# Patient Record
Sex: Male | Born: 1965 | Race: White | Hispanic: No | Marital: Single | Smoking: Never smoker
Health system: Southern US, Community
[De-identification: ages and names within clinical notes are randomized; demographics above are authoritative.]

## PROBLEM LIST (undated history)

## (undated) DIAGNOSIS — F32A Depression, unspecified: Secondary | ICD-10-CM

## (undated) DIAGNOSIS — M109 Gout, unspecified: Secondary | ICD-10-CM

## (undated) DIAGNOSIS — F329 Major depressive disorder, single episode, unspecified: Secondary | ICD-10-CM

## (undated) DIAGNOSIS — J309 Allergic rhinitis, unspecified: Secondary | ICD-10-CM

## (undated) DIAGNOSIS — Z87442 Personal history of urinary calculi: Secondary | ICD-10-CM

## (undated) DIAGNOSIS — G629 Polyneuropathy, unspecified: Secondary | ICD-10-CM

## (undated) DIAGNOSIS — R112 Nausea with vomiting, unspecified: Secondary | ICD-10-CM

## (undated) DIAGNOSIS — E039 Hypothyroidism, unspecified: Secondary | ICD-10-CM

## (undated) DIAGNOSIS — Z8709 Personal history of other diseases of the respiratory system: Secondary | ICD-10-CM

## (undated) DIAGNOSIS — D497 Neoplasm of unspecified behavior of endocrine glands and other parts of nervous system: Secondary | ICD-10-CM

## (undated) DIAGNOSIS — I10 Essential (primary) hypertension: Secondary | ICD-10-CM

## (undated) DIAGNOSIS — F419 Anxiety disorder, unspecified: Secondary | ICD-10-CM

## (undated) DIAGNOSIS — N189 Chronic kidney disease, unspecified: Secondary | ICD-10-CM

## (undated) DIAGNOSIS — M1990 Unspecified osteoarthritis, unspecified site: Secondary | ICD-10-CM

## (undated) DIAGNOSIS — I5030 Unspecified diastolic (congestive) heart failure: Secondary | ICD-10-CM

## (undated) DIAGNOSIS — N200 Calculus of kidney: Secondary | ICD-10-CM

## (undated) DIAGNOSIS — Z9889 Other specified postprocedural states: Secondary | ICD-10-CM

## (undated) DIAGNOSIS — G4730 Sleep apnea, unspecified: Secondary | ICD-10-CM

## (undated) HISTORY — PX: COLON RESECTION: SHX5231

## (undated) HISTORY — DX: Polyneuropathy, unspecified: G62.9

## (undated) HISTORY — DX: Unspecified diastolic (congestive) heart failure: I50.30

## (undated) HISTORY — PX: OTHER SURGICAL HISTORY: SHX169

## (undated) HISTORY — DX: Sleep apnea, unspecified: G47.30

## (undated) HISTORY — DX: Essential (primary) hypertension: I10

## (undated) HISTORY — PX: HERNIA REPAIR: SHX51

## (undated) HISTORY — DX: Calculus of kidney: N20.0

## (undated) HISTORY — DX: Gout, unspecified: M10.9

## (undated) HISTORY — PX: BRAIN SURGERY: SHX531

## (undated) HISTORY — PX: ACHILLES TENDON REPAIR: SUR1153

## (undated) HISTORY — DX: Allergic rhinitis, unspecified: J30.9

---

## 2010-03-24 ENCOUNTER — Emergency Department (HOSPITAL_BASED_OUTPATIENT_CLINIC_OR_DEPARTMENT_OTHER): Admission: EM | Admit: 2010-03-24 | Discharge: 2010-03-24 | Payer: Self-pay | Admitting: Emergency Medicine

## 2010-03-24 ENCOUNTER — Ambulatory Visit: Payer: Self-pay | Admitting: Radiology

## 2011-12-04 DIAGNOSIS — Z8619 Personal history of other infectious and parasitic diseases: Secondary | ICD-10-CM | POA: Diagnosis present

## 2012-05-21 DIAGNOSIS — I10 Essential (primary) hypertension: Secondary | ICD-10-CM | POA: Insufficient documentation

## 2012-11-22 DIAGNOSIS — N200 Calculus of kidney: Secondary | ICD-10-CM | POA: Insufficient documentation

## 2012-11-22 DIAGNOSIS — N281 Cyst of kidney, acquired: Secondary | ICD-10-CM | POA: Insufficient documentation

## 2012-12-27 DIAGNOSIS — L02211 Cutaneous abscess of abdominal wall: Secondary | ICD-10-CM | POA: Diagnosis present

## 2013-04-23 DIAGNOSIS — E790 Hyperuricemia without signs of inflammatory arthritis and tophaceous disease: Secondary | ICD-10-CM | POA: Insufficient documentation

## 2013-06-05 HISTORY — PX: ABDOMINAL SURGERY: SHX537

## 2013-06-10 DIAGNOSIS — G4730 Sleep apnea, unspecified: Secondary | ICD-10-CM | POA: Insufficient documentation

## 2013-07-28 DIAGNOSIS — L089 Local infection of the skin and subcutaneous tissue, unspecified: Secondary | ICD-10-CM | POA: Insufficient documentation

## 2013-07-28 DIAGNOSIS — T148XXA Other injury of unspecified body region, initial encounter: Secondary | ICD-10-CM

## 2013-08-02 DIAGNOSIS — E668 Other obesity: Secondary | ICD-10-CM | POA: Diagnosis present

## 2013-08-02 DIAGNOSIS — I5032 Chronic diastolic (congestive) heart failure: Secondary | ICD-10-CM | POA: Diagnosis present

## 2013-08-07 DIAGNOSIS — I5033 Acute on chronic diastolic (congestive) heart failure: Secondary | ICD-10-CM | POA: Diagnosis present

## 2013-08-07 DIAGNOSIS — S37009A Unspecified injury of unspecified kidney, initial encounter: Secondary | ICD-10-CM | POA: Insufficient documentation

## 2013-08-07 DIAGNOSIS — G4733 Obstructive sleep apnea (adult) (pediatric): Secondary | ICD-10-CM | POA: Diagnosis present

## 2013-08-07 DIAGNOSIS — R5381 Other malaise: Secondary | ICD-10-CM | POA: Insufficient documentation

## 2013-08-07 DIAGNOSIS — G64 Other disorders of peripheral nervous system: Secondary | ICD-10-CM | POA: Insufficient documentation

## 2013-08-07 DIAGNOSIS — M159 Polyosteoarthritis, unspecified: Secondary | ICD-10-CM | POA: Insufficient documentation

## 2013-08-29 ENCOUNTER — Encounter (HOSPITAL_BASED_OUTPATIENT_CLINIC_OR_DEPARTMENT_OTHER): Payer: BC Managed Care – PPO | Attending: General Surgery

## 2013-08-29 DIAGNOSIS — T8189XA Other complications of procedures, not elsewhere classified, initial encounter: Secondary | ICD-10-CM | POA: Insufficient documentation

## 2013-08-29 DIAGNOSIS — Y836 Removal of other organ (partial) (total) as the cause of abnormal reaction of the patient, or of later complication, without mention of misadventure at the time of the procedure: Secondary | ICD-10-CM | POA: Insufficient documentation

## 2013-09-05 ENCOUNTER — Encounter (HOSPITAL_BASED_OUTPATIENT_CLINIC_OR_DEPARTMENT_OTHER): Payer: BC Managed Care – PPO | Attending: General Surgery

## 2013-09-05 DIAGNOSIS — Z9049 Acquired absence of other specified parts of digestive tract: Secondary | ICD-10-CM | POA: Insufficient documentation

## 2013-09-05 DIAGNOSIS — Y836 Removal of other organ (partial) (total) as the cause of abnormal reaction of the patient, or of later complication, without mention of misadventure at the time of the procedure: Secondary | ICD-10-CM | POA: Insufficient documentation

## 2013-09-05 DIAGNOSIS — T8189XA Other complications of procedures, not elsewhere classified, initial encounter: Secondary | ICD-10-CM | POA: Insufficient documentation

## 2013-10-03 ENCOUNTER — Encounter: Payer: Self-pay | Admitting: Pulmonary Disease

## 2013-10-06 ENCOUNTER — Ambulatory Visit (INDEPENDENT_AMBULATORY_CARE_PROVIDER_SITE_OTHER): Payer: BC Managed Care – PPO | Admitting: Pulmonary Disease

## 2013-10-06 ENCOUNTER — Encounter (INDEPENDENT_AMBULATORY_CARE_PROVIDER_SITE_OTHER): Payer: Self-pay

## 2013-10-06 ENCOUNTER — Ambulatory Visit (INDEPENDENT_AMBULATORY_CARE_PROVIDER_SITE_OTHER)
Admission: RE | Admit: 2013-10-06 | Discharge: 2013-10-06 | Disposition: A | Discharge status: Home / Self Care | Payer: BC Managed Care – PPO | Source: Ambulatory Visit | Attending: Pulmonary Disease | Admitting: Pulmonary Disease

## 2013-10-06 ENCOUNTER — Encounter: Payer: Self-pay | Admitting: Pulmonary Disease

## 2013-10-06 VITALS — BP 128/78 | HR 78 | Ht 71.0 in | Wt >= 6400 oz

## 2013-10-06 DIAGNOSIS — R062 Wheezing: Secondary | ICD-10-CM

## 2013-10-06 DIAGNOSIS — R0600 Dyspnea, unspecified: Secondary | ICD-10-CM

## 2013-10-06 DIAGNOSIS — R0609 Other forms of dyspnea: Secondary | ICD-10-CM

## 2013-10-06 DIAGNOSIS — R0989 Other specified symptoms and signs involving the circulatory and respiratory systems: Secondary | ICD-10-CM

## 2013-10-06 DIAGNOSIS — J45909 Unspecified asthma, uncomplicated: Secondary | ICD-10-CM | POA: Insufficient documentation

## 2013-10-06 DIAGNOSIS — G4733 Obstructive sleep apnea (adult) (pediatric): Secondary | ICD-10-CM

## 2013-10-06 MED ORDER — ALBUTEROL SULFATE HFA 108 (90 BASE) MCG/ACT IN AERS: 1.0000 | INHALATION_SPRAY | Freq: Four times a day (QID) | RESPIRATORY_TRACT | Status: DC | PRN

## 2013-10-06 NOTE — Progress Notes (Signed)
Chief Complaint  Patient presents with  . Pulmonary Consult    referred by Dr. Shelia Media for SOB and wheezing.    History of Present Illness: [name] [name] is a 48 y.o. male for evaluation of dyspnea and sleep problems.  He was in hospital recently for abdominal surgery with persistent infection.  During hospital stay there was concern he has sleep apnea.  He was tried on BiPAP in hospital, and this helped.  He does not have CPAP/BiPAP at home.  He does snore, and stops breathing while asleep.  He will wake up with a choking sensation.    He goes to bed at 1230 am.  He wakes up several times to use the bathroom.  He gets out of bed at 930 am.  He feels tired in the morning.  He can fall asleep easily if he is sitting quiet.  His Epworth score is 13 out of 24.  He does sometimes talk in his sleep.  He has noticed more trouble with his breathing since being in hospital.  He gets wheeze in his throat and his chest.  He is not aware of asthma history, but does get seasonal allergies (worse in Spring/Fall).  He gets post-nasal drip.  He has used flonase before >> this helped, but not using recently.  He denies reflux.  There is no history of smoking.  He denies history of pneumonia.  He was given nebulizer treatment in hospital, and this helped.  Tests: Echo 06/10/13 >> EF 60 to 65%  Demar Camuso  has a past medical history of Sleep apnea; Allergic rhinitis; Diastolic CHF; Gout; Nephrolithiasis; Peripheral neuropathy; and Hypertension.  Jovanie Tulloch  has past surgical history that includes Sigmoid Colonoscopy and Hernia repair.  Prior to Admission medications   Medication Sig Start Date End Date Taking? Authorizing Provider  acetaminophen (TYLENOL) 650 MG CR tablet Take 650 mg by mouth every 6 (six) hours as needed for pain.   Yes Historical Provider, MD  allopurinol (ZYLOPRIM) 300 MG tablet Take 300 mg by mouth daily.   Yes Historical Provider, MD  ALPRAZolam (XANAX) 0.25 MG tablet  Take 0.25 mg by mouth 3 (three) times daily as needed for anxiety.   Yes Historical Provider, MD  buPROPion (WELLBUTRIN XL) 300 MG 24 hr tablet Take 300 mg by mouth daily.   Yes Historical Provider, MD  carvedilol (COREG) 25 MG tablet Take 12.5 mg by mouth 2 (two) times daily with a meal.    Yes Historical Provider, MD  Diclofenac-Misoprostol (ARTHROTEC) 75-0.2 MG TBEC Take 1 tablet by mouth 2 (two) times daily.    Yes Historical Provider, MD  doxycycline (VIBRAMYCIN) 100 MG capsule Take 1 capsule by mouth 2 (two) times daily. 10/02/13  Yes Historical Provider, MD  fluticasone (FLONASE) 50 MCG/ACT nasal spray Place 2 sprays into both nostrils daily.   Yes Historical Provider, MD  furosemide (LASIX) 40 MG tablet Take 40 mg by mouth daily.   Yes Historical Provider, MD  gabapentin (NEURONTIN) 300 MG capsule Take 900 mg by mouth at bedtime.   Yes Historical Provider, MD  HYDROcodone-acetaminophen (NORCO/VICODIN) 5-325 MG per tablet Take 1 tablet by mouth every 6 (six) hours as needed for moderate pain.   Yes Historical Provider, MD  Multiple Vitamin (MULTIVITAMIN) tablet Take 1 tablet by mouth daily.   Yes Historical Provider, MD  potassium chloride SA (K-DUR,KLOR-CON) 20 MEQ tablet Take 20 mEq by mouth 2 (two) times daily.   Yes Historical Provider, MD  vitamin C (ASCORBIC  ACID) 500 MG tablet Take 500 mg by mouth daily.   Yes Historical Provider, MD  zinc sulfate 220 MG capsule Take 220 mg by mouth daily.   Yes Historical Provider, MD    Allergies  Allergen Reactions  . Morphine And Related Hives  . Penicillins Nausea And Vomiting    His family history includes Gout in his brother and father; Hypertension in his father and mother; Hypothyroidism in his father.  He  reports that he has never smoked. He has never used smokeless tobacco. He reports that he does not drink alcohol or use illicit drugs.  Review of Systems  Constitutional: Negative for fever and unexpected weight change.  HENT:  Negative for congestion, dental problem, ear pain, nosebleeds, postnasal drip, rhinorrhea, sinus pressure, sneezing, sore throat and trouble swallowing.   Eyes: Negative for redness and itching.  Respiratory: Positive for shortness of breath. Negative for cough, chest tightness and wheezing.   Cardiovascular: Negative for palpitations and leg swelling.  Gastrointestinal: Negative for nausea and vomiting.  Genitourinary: Negative for dysuria.  Musculoskeletal: Positive for arthralgias and joint swelling.  Skin: Negative for rash.  Neurological: Negative for headaches.  Hematological: Does not bruise/bleed easily.  Psychiatric/Behavioral: Positive for dysphoric mood. The patient is nervous/anxious.    Physical Exam:  General - No distress, in wheelchair ENT - No sinus tenderness, no oral exudate, no LAN, no thyromegaly, TM clear, pupils equal/reactive, MP 4 Cardiac - s1s2 regular, no murmur, pulses symmetric Chest - No wheeze/rales/dullness, good air entry, normal respiratory excursion Back - No focal tenderness Abd - wound vac in place Ext - 2+ non pitting edema Neuro - Normal strength, cranial nerves intact Skin - No rashes Psych - Normal mood, and behavior   Assessment/Plan:  Chesley Mires, MD [facility] Pager:  [phone]

## 2013-10-06 NOTE — Patient Instructions (Signed)
Proair two puffs up to four times per day for cough, wheeze, or chest congestion Flonase two sprays each nostril daily Will schedule breathing test (PFT) Chest xray today Will schedule home sleep study Follow up in 8 weeks

## 2013-10-06 NOTE — Assessment & Plan Note (Addendum)
He has snoring, sleep disruption, witnessed apnea, and daytime sleepiness.  He has history of hypertension.  I am concerned he has sleep apnea.  We discussed how sleep apnea can affect various health problems including risks for hypertension, cardiovascular disease, and diabetes.  We also discussed how sleep disruption can increase risks for accident, such as while driving.  Weight loss as a means of improving sleep apnea was also reviewed.  Additional treatment options discussed were CPAP therapy, oral appliance, and surgical intervention.  Will arrange for home sleep study so that he can have support structures in place for his abdominal wound vac.  I do not think it would be safe for him to have sleep study in sleep lab at this time.

## 2013-10-06 NOTE — Assessment & Plan Note (Signed)
He has significant allergies and likely has post-nasal drip with upper airway cough syndrome.  Will have him try flonase.  He may have a component of asthma.  Will check CXR and PFT.  Will also try him on prn proair.

## 2013-10-06 NOTE — Progress Notes (Deleted)
   Subjective:    Patient ID: [name] [name], male    DOB: 1965/08/09, 48 y.o.   MRN: [id number]  HPI    Review of Systems  Constitutional: Negative for fever and unexpected weight change.  HENT: Negative for congestion, dental problem, ear pain, nosebleeds, postnasal drip, rhinorrhea, sinus pressure, sneezing, sore throat and trouble swallowing.   Eyes: Negative for redness and itching.  Respiratory: Positive for shortness of breath. Negative for cough, chest tightness and wheezing.   Cardiovascular: Negative for palpitations and leg swelling.  Gastrointestinal: Negative for nausea and vomiting.  Genitourinary: Negative for dysuria.  Musculoskeletal: Positive for arthralgias and joint swelling.  Skin: Negative for rash.  Neurological: Negative for headaches.  Hematological: Does not bruise/bleed easily.  Psychiatric/Behavioral: Positive for dysphoric mood. The patient is nervous/anxious.        Objective:   Physical Exam        Assessment & Plan:

## 2013-10-07 ENCOUNTER — Other Ambulatory Visit: Payer: Self-pay | Admitting: Internal Medicine

## 2013-10-07 DIAGNOSIS — R599 Enlarged lymph nodes, unspecified: Secondary | ICD-10-CM

## 2013-10-16 ENCOUNTER — Encounter (HOSPITAL_BASED_OUTPATIENT_CLINIC_OR_DEPARTMENT_OTHER): Payer: BC Managed Care – PPO | Attending: Internal Medicine

## 2013-10-16 DIAGNOSIS — Y836 Removal of other organ (partial) (total) as the cause of abnormal reaction of the patient, or of later complication, without mention of misadventure at the time of the procedure: Secondary | ICD-10-CM | POA: Insufficient documentation

## 2013-10-16 DIAGNOSIS — T8189XA Other complications of procedures, not elsewhere classified, initial encounter: Secondary | ICD-10-CM | POA: Insufficient documentation

## 2013-10-16 DIAGNOSIS — Z9049 Acquired absence of other specified parts of digestive tract: Secondary | ICD-10-CM | POA: Insufficient documentation

## 2013-10-21 ENCOUNTER — Telehealth: Payer: Self-pay | Admitting: Pulmonary Disease

## 2013-10-21 NOTE — Telephone Encounter (Signed)
lmtcb x1 

## 2013-10-21 NOTE — Telephone Encounter (Signed)
I called and spoke with the pt and notified of results per VS He verbalized understanding  Nothing further nereded

## 2013-10-21 NOTE — Telephone Encounter (Signed)
Dg Chest 1 View  10/06/2013   CLINICAL DATA:  Wheezing, shortness of Breath  EXAM: CHEST - 1 VIEW  COMPARISON:  None.  FINDINGS: Cardiomegaly is noted. There is hazy atelectasis or infiltrate in right lower lobe. Follow-up to resolution is recommended. No pulmonary edema. Mild basilar atelectasis.  IMPRESSION: No pulmonary edema. Cardiomegaly. Hazy atelectasis or infiltrate in right lower lobe. Follow-up to resolution is recommended.   Electronically Signed   By: Lahoma Crocker M.D.   On: 10/06/2013 13:47   Will have my nurse inform pt that CXR showed very mild congestion at lung base.  No change to current therapy plan.  He will need repeat chest xray at next visit to monitor this.

## 2013-10-21 NOTE — Telephone Encounter (Signed)
Patient returning call.

## 2013-10-28 ENCOUNTER — Other Ambulatory Visit: Payer: BC Managed Care – PPO

## 2013-10-30 ENCOUNTER — Other Ambulatory Visit: Payer: BC Managed Care – PPO

## 2013-10-30 DIAGNOSIS — T8189XA Other complications of procedures, not elsewhere classified, initial encounter: Secondary | ICD-10-CM | POA: Diagnosis not present

## 2013-10-30 DIAGNOSIS — Z9049 Acquired absence of other specified parts of digestive tract: Secondary | ICD-10-CM | POA: Diagnosis not present

## 2013-10-30 DIAGNOSIS — Y836 Removal of other organ (partial) (total) as the cause of abnormal reaction of the patient, or of later complication, without mention of misadventure at the time of the procedure: Secondary | ICD-10-CM | POA: Diagnosis not present

## 2013-11-06 ENCOUNTER — Other Ambulatory Visit: Payer: BC Managed Care – PPO

## 2013-11-06 ENCOUNTER — Ambulatory Visit
Admission: RE | Admit: 2013-11-06 | Discharge: 2013-11-06 | Disposition: A | Discharge status: Home / Self Care | Payer: BC Managed Care – PPO | Source: Ambulatory Visit | Attending: Internal Medicine | Admitting: Internal Medicine

## 2013-11-06 DIAGNOSIS — R599 Enlarged lymph nodes, unspecified: Secondary | ICD-10-CM

## 2013-11-10 ENCOUNTER — Telehealth: Payer: Self-pay | Admitting: Pulmonary Disease

## 2013-11-10 NOTE — Telephone Encounter (Signed)
Returned pt's call, LMTCB on pt home/cell # [name] [name]

## 2013-11-10 NOTE — Telephone Encounter (Signed)
Please advise PCC;s thanks 

## 2013-11-10 NOTE — Telephone Encounter (Signed)
Pt returned my call & scheduled to pick up [facility] 8/88/91 @ 3 pm Dawne J Law

## 2013-11-13 ENCOUNTER — Encounter (HOSPITAL_BASED_OUTPATIENT_CLINIC_OR_DEPARTMENT_OTHER): Payer: BC Managed Care – PPO | Attending: Internal Medicine

## 2013-11-13 DIAGNOSIS — Y836 Removal of other organ (partial) (total) as the cause of abnormal reaction of the patient, or of later complication, without mention of misadventure at the time of the procedure: Secondary | ICD-10-CM | POA: Insufficient documentation

## 2013-11-13 DIAGNOSIS — T8189XA Other complications of procedures, not elsewhere classified, initial encounter: Secondary | ICD-10-CM | POA: Insufficient documentation

## 2013-11-13 DIAGNOSIS — Z9049 Acquired absence of other specified parts of digestive tract: Secondary | ICD-10-CM | POA: Insufficient documentation

## 2013-11-19 ENCOUNTER — Ambulatory Visit: Payer: BC Managed Care – PPO | Admitting: Cardiology

## 2013-11-24 ENCOUNTER — Ambulatory Visit (INDEPENDENT_AMBULATORY_CARE_PROVIDER_SITE_OTHER): Payer: BC Managed Care – PPO | Admitting: General Surgery

## 2013-11-25 ENCOUNTER — Ambulatory Visit: Payer: BC Managed Care – PPO | Admitting: Pulmonary Disease

## 2013-11-27 ENCOUNTER — Other Ambulatory Visit (HOSPITAL_BASED_OUTPATIENT_CLINIC_OR_DEPARTMENT_OTHER): Payer: Self-pay | Admitting: Internal Medicine

## 2013-12-04 ENCOUNTER — Encounter (HOSPITAL_BASED_OUTPATIENT_CLINIC_OR_DEPARTMENT_OTHER): Payer: BC Managed Care – PPO | Attending: Internal Medicine

## 2013-12-06 ENCOUNTER — Inpatient Hospital Stay (HOSPITAL_COMMUNITY)
Admission: EM | Admit: 2013-12-06 | Discharge: 2013-12-20 | DRG: 614 | Disposition: A | Discharge status: Rehabilitation Facility | Payer: BC Managed Care – PPO | Attending: Internal Medicine | Admitting: Internal Medicine

## 2013-12-06 ENCOUNTER — Emergency Department (HOSPITAL_COMMUNITY): Payer: BC Managed Care – PPO

## 2013-12-06 ENCOUNTER — Encounter (HOSPITAL_COMMUNITY): Payer: Self-pay | Admitting: Emergency Medicine

## 2013-12-06 DIAGNOSIS — K5900 Constipation, unspecified: Secondary | ICD-10-CM | POA: Diagnosis present

## 2013-12-06 DIAGNOSIS — F3289 Other specified depressive episodes: Secondary | ICD-10-CM | POA: Diagnosis present

## 2013-12-06 DIAGNOSIS — L089 Local infection of the skin and subcutaneous tissue, unspecified: Secondary | ICD-10-CM

## 2013-12-06 DIAGNOSIS — M109 Gout, unspecified: Secondary | ICD-10-CM | POA: Diagnosis present

## 2013-12-06 DIAGNOSIS — I5032 Chronic diastolic (congestive) heart failure: Secondary | ICD-10-CM

## 2013-12-06 DIAGNOSIS — J9600 Acute respiratory failure, unspecified whether with hypoxia or hypercapnia: Secondary | ICD-10-CM

## 2013-12-06 DIAGNOSIS — S31109A Unspecified open wound of abdominal wall, unspecified quadrant without penetration into peritoneal cavity, initial encounter: Secondary | ICD-10-CM

## 2013-12-06 DIAGNOSIS — I5033 Acute on chronic diastolic (congestive) heart failure: Secondary | ICD-10-CM

## 2013-12-06 DIAGNOSIS — R220 Localized swelling, mass and lump, head: Secondary | ICD-10-CM

## 2013-12-06 DIAGNOSIS — IMO0002 Reserved for concepts with insufficient information to code with codable children: Secondary | ICD-10-CM

## 2013-12-06 DIAGNOSIS — Z6841 Body Mass Index (BMI) 40.0 and over, adult: Secondary | ICD-10-CM

## 2013-12-06 DIAGNOSIS — M1710 Unilateral primary osteoarthritis, unspecified knee: Secondary | ICD-10-CM | POA: Diagnosis present

## 2013-12-06 DIAGNOSIS — E876 Hypokalemia: Secondary | ICD-10-CM

## 2013-12-06 DIAGNOSIS — Z22322 Carrier or suspected carrier of Methicillin resistant Staphylococcus aureus: Secondary | ICD-10-CM

## 2013-12-06 DIAGNOSIS — E230 Hypopituitarism: Secondary | ICD-10-CM

## 2013-12-06 DIAGNOSIS — E668 Other obesity: Secondary | ICD-10-CM

## 2013-12-06 DIAGNOSIS — F329 Major depressive disorder, single episode, unspecified: Secondary | ICD-10-CM

## 2013-12-06 DIAGNOSIS — J9612 Chronic respiratory failure with hypercapnia: Secondary | ICD-10-CM | POA: Diagnosis present

## 2013-12-06 DIAGNOSIS — R5381 Other malaise: Secondary | ICD-10-CM | POA: Diagnosis present

## 2013-12-06 DIAGNOSIS — E669 Obesity, unspecified: Secondary | ICD-10-CM

## 2013-12-06 DIAGNOSIS — A4902 Methicillin resistant Staphylococcus aureus infection, unspecified site: Secondary | ICD-10-CM

## 2013-12-06 DIAGNOSIS — D353 Benign neoplasm of craniopharyngeal duct: Principal | ICD-10-CM

## 2013-12-06 DIAGNOSIS — R7309 Other abnormal glucose: Secondary | ICD-10-CM | POA: Diagnosis present

## 2013-12-06 DIAGNOSIS — Z7401 Bed confinement status: Secondary | ICD-10-CM

## 2013-12-06 DIAGNOSIS — D696 Thrombocytopenia, unspecified: Secondary | ICD-10-CM

## 2013-12-06 DIAGNOSIS — G4733 Obstructive sleep apnea (adult) (pediatric): Secondary | ICD-10-CM

## 2013-12-06 DIAGNOSIS — I10 Essential (primary) hypertension: Secondary | ICD-10-CM

## 2013-12-06 DIAGNOSIS — A4901 Methicillin susceptible Staphylococcus aureus infection, unspecified site: Secondary | ICD-10-CM

## 2013-12-06 DIAGNOSIS — A498 Other bacterial infections of unspecified site: Secondary | ICD-10-CM

## 2013-12-06 DIAGNOSIS — D352 Benign neoplasm of pituitary gland: Principal | ICD-10-CM | POA: Diagnosis present

## 2013-12-06 DIAGNOSIS — R221 Localized swelling, mass and lump, neck: Secondary | ICD-10-CM

## 2013-12-06 DIAGNOSIS — G9389 Other specified disorders of brain: Secondary | ICD-10-CM

## 2013-12-06 DIAGNOSIS — F32A Depression, unspecified: Secondary | ICD-10-CM

## 2013-12-06 DIAGNOSIS — E662 Morbid (severe) obesity with alveolar hypoventilation: Secondary | ICD-10-CM

## 2013-12-06 DIAGNOSIS — R001 Bradycardia, unspecified: Secondary | ICD-10-CM

## 2013-12-06 DIAGNOSIS — K920 Hematemesis: Secondary | ICD-10-CM | POA: Diagnosis present

## 2013-12-06 DIAGNOSIS — D496 Neoplasm of unspecified behavior of brain: Secondary | ICD-10-CM

## 2013-12-06 DIAGNOSIS — H02401 Unspecified ptosis of right eyelid: Secondary | ICD-10-CM

## 2013-12-06 DIAGNOSIS — I5030 Unspecified diastolic (congestive) heart failure: Secondary | ICD-10-CM

## 2013-12-06 DIAGNOSIS — F411 Generalized anxiety disorder: Secondary | ICD-10-CM | POA: Diagnosis present

## 2013-12-06 DIAGNOSIS — J9602 Acute respiratory failure with hypercapnia: Secondary | ICD-10-CM

## 2013-12-06 DIAGNOSIS — H02409 Unspecified ptosis of unspecified eyelid: Secondary | ICD-10-CM | POA: Diagnosis present

## 2013-12-06 DIAGNOSIS — B372 Candidiasis of skin and nail: Secondary | ICD-10-CM | POA: Diagnosis present

## 2013-12-06 DIAGNOSIS — G4730 Sleep apnea, unspecified: Secondary | ICD-10-CM

## 2013-12-06 DIAGNOSIS — I509 Heart failure, unspecified: Secondary | ICD-10-CM | POA: Diagnosis present

## 2013-12-06 DIAGNOSIS — Z8619 Personal history of other infectious and parasitic diseases: Secondary | ICD-10-CM

## 2013-12-06 DIAGNOSIS — L03319 Cellulitis of trunk, unspecified: Secondary | ICD-10-CM

## 2013-12-06 DIAGNOSIS — Z79899 Other long term (current) drug therapy: Secondary | ICD-10-CM

## 2013-12-06 DIAGNOSIS — R062 Wheezing: Secondary | ICD-10-CM

## 2013-12-06 DIAGNOSIS — I5023 Acute on chronic systolic (congestive) heart failure: Secondary | ICD-10-CM

## 2013-12-06 DIAGNOSIS — G609 Hereditary and idiopathic neuropathy, unspecified: Secondary | ICD-10-CM

## 2013-12-06 DIAGNOSIS — J9620 Acute and chronic respiratory failure, unspecified whether with hypoxia or hypercapnia: Secondary | ICD-10-CM | POA: Diagnosis present

## 2013-12-06 DIAGNOSIS — I498 Other specified cardiac arrhythmias: Secondary | ICD-10-CM | POA: Diagnosis present

## 2013-12-06 DIAGNOSIS — R739 Hyperglycemia, unspecified: Secondary | ICD-10-CM

## 2013-12-06 DIAGNOSIS — A491 Streptococcal infection, unspecified site: Secondary | ICD-10-CM

## 2013-12-06 DIAGNOSIS — L929 Granulomatous disorder of the skin and subcutaneous tissue, unspecified: Secondary | ICD-10-CM

## 2013-12-06 DIAGNOSIS — L02219 Cutaneous abscess of trunk, unspecified: Secondary | ICD-10-CM | POA: Diagnosis present

## 2013-12-06 DIAGNOSIS — L02211 Cutaneous abscess of abdominal wall: Secondary | ICD-10-CM

## 2013-12-06 DIAGNOSIS — J9622 Acute and chronic respiratory failure with hypercapnia: Secondary | ICD-10-CM

## 2013-12-06 DIAGNOSIS — B379 Candidiasis, unspecified: Secondary | ICD-10-CM

## 2013-12-06 LAB — I-STAT ARTERIAL BLOOD GAS, ED
Acid-Base Excess: 1 mmol/L (ref 0.0–2.0)
Acid-Base Excess: 4 mmol/L — ABNORMAL HIGH (ref 0.0–2.0)
Bicarbonate: 32 mEq/L — ABNORMAL HIGH (ref 20.0–24.0)
Bicarbonate: 34.2 mEq/L — ABNORMAL HIGH (ref 20.0–24.0)
O2 Saturation: 93 %
O2 Saturation: 98 %
Patient temperature: 98.6
Patient temperature: 98.6
TCO2: 34 mmol/L (ref 0–100)
TCO2: 37 mmol/L (ref 0–100)
pCO2 arterial: 78.5 mmHg (ref 35.0–45.0)
pCO2 arterial: 76.9 mmHg (ref 35.0–45.0)
pH, Arterial: 7.218 — ABNORMAL LOW (ref 7.350–7.450)
pH, Arterial: 7.256 — ABNORMAL LOW (ref 7.350–7.450)
pO2, Arterial: 82 mmHg (ref 80.0–100.0)
pO2, Arterial: 118 mmHg — ABNORMAL HIGH (ref 80.0–100.0)

## 2013-12-06 LAB — CBC WITH DIFFERENTIAL/PLATELET
Basophils Absolute: 0 10*3/uL (ref 0.0–0.1)
Basophils Relative: 0 % (ref 0–1)
Eosinophils Absolute: 0 10*3/uL (ref 0.0–0.7)
Eosinophils Relative: 0 % (ref 0–5)
HCT: 45.9 % (ref 39.0–52.0)
Hemoglobin: 13.7 g/dL (ref 13.0–17.0)
Lymphocytes Relative: 4 % — ABNORMAL LOW (ref 12–46)
Lymphs Abs: 0.4 10*3/uL — ABNORMAL LOW (ref 0.7–4.0)
MCH: 26.9 pg (ref 26.0–34.0)
MCHC: 29.8 g/dL — ABNORMAL LOW (ref 30.0–36.0)
MCV: 90 fL (ref 78.0–100.0)
Monocytes Absolute: 0.8 10*3/uL (ref 0.1–1.0)
Monocytes Relative: 8 % (ref 3–12)
Neutro Abs: 9 10*3/uL — ABNORMAL HIGH (ref 1.7–7.7)
Neutrophils Relative %: 88 % — ABNORMAL HIGH (ref 43–77)
Platelets: 146 10*3/uL — ABNORMAL LOW (ref 150–400)
RBC: 5.1 MIL/uL (ref 4.22–5.81)
RDW: 18.6 % — ABNORMAL HIGH (ref 11.5–15.5)
WBC: 10.2 10*3/uL (ref 4.0–10.5)

## 2013-12-06 LAB — COMPREHENSIVE METABOLIC PANEL
ALT: 35 U/L (ref 0–53)
AST: 23 U/L (ref 0–37)
Albumin: 3.9 g/dL (ref 3.5–5.2)
Alkaline Phosphatase: 100 U/L (ref 39–117)
Anion gap: 10 (ref 5–15)
BUN: 19 mg/dL (ref 6–23)
CO2: 30 mEq/L (ref 19–32)
Calcium: 8.4 mg/dL (ref 8.4–10.5)
Chloride: 98 mEq/L (ref 96–112)
Creatinine, Ser: 0.75 mg/dL (ref 0.50–1.35)
GFR calc Af Amer: >90 mL/min (ref >90)
GFR calc non Af Amer: >90 mL/min (ref >90)
Glucose, Bld: 148 mg/dL — ABNORMAL HIGH (ref 70–99)
Potassium: 3.9 mEq/L (ref 3.7–5.3)
Sodium: 138 mEq/L (ref 137–147)
Total Bilirubin: 0.4 mg/dL (ref 0.3–1.2)
Total Protein: 7.1 g/dL (ref 6.0–8.3)

## 2013-12-06 LAB — URINALYSIS, ROUTINE W REFLEX MICROSCOPIC
Bilirubin Urine: NEGATIVE
Glucose, UA: NEGATIVE mg/dL
Hgb urine dipstick: NEGATIVE
Ketones, ur: NEGATIVE mg/dL
Leukocytes, UA: NEGATIVE
Nitrite: NEGATIVE
Protein, ur: NEGATIVE mg/dL
Specific Gravity, Urine: 1.036 — ABNORMAL HIGH (ref 1.005–1.030)
Urobilinogen, UA: 0.2 mg/dL (ref 0.0–1.0)
pH: 6.5 (ref 5.0–8.0)

## 2013-12-06 LAB — MRSA PCR SCREENING: MRSA by PCR: POSITIVE — AB

## 2013-12-06 LAB — TSH: TSH: 0.137 u[IU]/mL — ABNORMAL LOW (ref 0.350–4.500)

## 2013-12-06 LAB — T4, FREE: Free T4: 0.81 ng/dL (ref 0.80–1.80)

## 2013-12-06 LAB — CORTISOL: Cortisol, Plasma: 31.2 ug/dL

## 2013-12-06 LAB — LACTIC ACID, PLASMA: Lactic Acid, Venous: 0.8 mmol/L (ref 0.5–2.2)

## 2013-12-06 MED ORDER — FENTANYL CITRATE 0.05 MG/ML IJ SOLN
50.0000 ug | INTRAMUSCULAR | Status: DC | PRN
  Administered 2013-12-06: 50 ug via INTRAVENOUS
  Administered 2013-12-06: 100 ug via INTRAVENOUS
  Administered 2013-12-06: 50 ug via INTRAVENOUS
  Administered 2013-12-07 (×3): 100 ug via INTRAVENOUS
  Filled 2013-12-06 (×6): qty 2

## 2013-12-06 MED ORDER — IOHEXOL 300 MG/ML  SOLN
80.0000 mL | Freq: Once | INTRAMUSCULAR | Status: AC | PRN
  Administered 2013-12-06: 80 mL via INTRAVENOUS

## 2013-12-06 MED ORDER — CARVEDILOL 12.5 MG PO TABS
12.5000 mg | ORAL_TABLET | Freq: Two times a day (BID) | ORAL | Status: DC
  Administered 2013-12-07 – 2013-12-10 (×7): 12.5 mg via ORAL
  Filled 2013-12-06 (×10): qty 1

## 2013-12-06 MED ORDER — HEPARIN SODIUM (PORCINE) 5000 UNIT/ML IJ SOLN
5000.0000 [IU] | Freq: Three times a day (TID) | INTRAMUSCULAR | Status: DC
  Administered 2013-12-06 – 2013-12-09 (×8): 5000 [IU] via SUBCUTANEOUS
  Filled 2013-12-06 (×11): qty 1

## 2013-12-06 MED ORDER — FLUCONAZOLE 100MG IVPB
100.0000 mg | INTRAVENOUS | Status: AC
  Administered 2013-12-06 – 2013-12-08 (×3): 100 mg via INTRAVENOUS
  Filled 2013-12-06 (×3): qty 50

## 2013-12-06 MED ORDER — LORAZEPAM 2 MG/ML IJ SOLN
0.5000 mg | INTRAMUSCULAR | Status: DC | PRN
  Administered 2013-12-06: 1 mg via INTRAVENOUS
  Filled 2013-12-06: qty 1

## 2013-12-06 MED ORDER — GABAPENTIN 300 MG PO CAPS
900.0000 mg | ORAL_CAPSULE | Freq: Every day | ORAL | Status: DC
  Administered 2013-12-06 – 2013-12-09 (×4): 900 mg via ORAL
  Filled 2013-12-06 (×5): qty 3

## 2013-12-06 MED ORDER — ONE-DAILY MULTI VITAMINS PO TABS: 1.0000 | ORAL_TABLET | Freq: Every day | ORAL | Status: DC

## 2013-12-06 MED ORDER — ADULT MULTIVITAMIN W/MINERALS CH
1.0000 | ORAL_TABLET | Freq: Every day | ORAL | Status: DC
  Administered 2013-12-07 – 2013-12-10 (×4): 1 via ORAL
  Filled 2013-12-06 (×5): qty 1

## 2013-12-06 MED ORDER — PANTOPRAZOLE SODIUM 40 MG IV SOLR: 40.0000 mg | Freq: Every day | INTRAVENOUS | Status: DC

## 2013-12-06 MED ORDER — FUROSEMIDE 40 MG PO TABS
40.0000 mg | ORAL_TABLET | Freq: Every day | ORAL | Status: DC
  Administered 2013-12-08 – 2013-12-10 (×3): 40 mg via ORAL
  Filled 2013-12-06 (×4): qty 1

## 2013-12-06 MED ORDER — BUPROPION HCL ER (XL) 300 MG PO TB24
300.0000 mg | ORAL_TABLET | Freq: Every day | ORAL | Status: DC
  Administered 2013-12-07 – 2013-12-10 (×4): 300 mg via ORAL
  Filled 2013-12-06 (×5): qty 1

## 2013-12-06 MED ORDER — TRAMADOL HCL 50 MG PO TABS
50.0000 mg | ORAL_TABLET | Freq: Four times a day (QID) | ORAL | Status: DC | PRN
  Administered 2013-12-06 – 2013-12-07 (×3): 50 mg via ORAL
  Filled 2013-12-06 (×3): qty 1

## 2013-12-06 MED ORDER — POTASSIUM CHLORIDE CRYS ER 20 MEQ PO TBCR
20.0000 meq | EXTENDED_RELEASE_TABLET | Freq: Two times a day (BID) | ORAL | Status: DC
  Administered 2013-12-06 – 2013-12-10 (×8): 20 meq via ORAL
  Filled 2013-12-06 (×9): qty 1

## 2013-12-06 MED ORDER — PANTOPRAZOLE SODIUM 40 MG IV SOLR
40.0000 mg | Freq: Two times a day (BID) | INTRAVENOUS | Status: DC
  Administered 2013-12-06: 40 mg via INTRAVENOUS
  Filled 2013-12-06 (×2): qty 40

## 2013-12-06 MED ORDER — SODIUM CHLORIDE 0.9 % IV SOLN
INTRAVENOUS | Status: DC
  Administered 2013-12-06: 15:00:00 via INTRAVENOUS

## 2013-12-06 MED ORDER — KETOROLAC TROMETHAMINE 15 MG/ML IJ SOLN
15.0000 mg | Freq: Four times a day (QID) | INTRAMUSCULAR | Status: DC | PRN
  Administered 2013-12-06: 15 mg via INTRAVENOUS
  Filled 2013-12-06: qty 1

## 2013-12-06 MED ORDER — SODIUM CHLORIDE 0.9 % IV SOLN
250.0000 mL | INTRAVENOUS | Status: DC | PRN
  Administered 2013-12-10: 17:00:00 via INTRAVENOUS
  Administered 2013-12-11: 250 mL via INTRAVENOUS

## 2013-12-06 MED ORDER — ONDANSETRON HCL 4 MG/2ML IJ SOLN
4.0000 mg | Freq: Four times a day (QID) | INTRAMUSCULAR | Status: DC | PRN
  Administered 2013-12-06 – 2013-12-17 (×15): 4 mg via INTRAVENOUS
  Filled 2013-12-06 (×15): qty 2

## 2013-12-06 NOTE — ED Notes (Signed)
Pt and family requesting to speak with MD regarding pain medications. MD aware.

## 2013-12-06 NOTE — ED Provider Notes (Signed)
CSN: [id number]     Arrival date & time 12/06/13  0723 History   First MD Initiated Contact with Patient 12/06/13 [phone]     Chief Complaint  Patient presents with  . Headache     (Consider location/radiation/quality/duration/timing/severity/associated sxs/prior Treatment) HPI Comments: 48 yo male presenting as a transfer from [facility] ED secondary to a suprasellar mass being detected on a CT scan performed due to a headache.  Headache has been present for a few days, is associated with blurry vision, constant and severe, and improved dramatically after receiving treatment with Reglan and Dilaudid in the [facility] ED.    Pt is drowsy now and history is limited secondary to patient falling asleep multiple times during our interview.  Level V Caveat applies.    Patient is a 48 y.o. male presenting with headaches.  Headache   Past Medical History  Diagnosis Date  . Sleep apnea   . Allergic rhinitis   . Diastolic CHF   . Gout   . Nephrolithiasis   . Peripheral neuropathy   . Hypertension    Past Surgical History  Procedure Laterality Date  . Sigmoid colonoscopy    . Hernia repair    . Abdominal surgery      for cyst with MRSA   Family History  Problem Relation Age of Onset  . Hypertension Mother   . Hypertension Father   . Gout Father   . Hypothyroidism Father   . Gout Brother    History  Substance Use Topics  . Smoking status: Never Smoker   . Smokeless tobacco: Never Used  . Alcohol Use: No    Review of Systems  Neurological: Positive for headaches.  All other systems reviewed and are negative.     Allergies  Morphine and related and Penicillins  Home Medications   Prior to Admission medications   Medication Sig Start Date End Date Taking? Authorizing Provider  acetaminophen (TYLENOL) 650 MG CR tablet Take 650 mg by mouth every 6 (six) hours as needed for pain.    Historical Provider, MD  albuterol (PROAIR HFA) 108 (90 BASE) MCG/ACT inhaler Inhale 1-2  puffs into the lungs every 6 (six) hours as needed for wheezing or shortness of breath. 10/06/13   Chesley Mires, MD  allopurinol (ZYLOPRIM) 300 MG tablet Take 300 mg by mouth daily.    Historical Provider, MD  ALPRAZolam Duanne Moron) 0.25 MG tablet Take 0.25 mg by mouth 3 (three) times daily as needed for anxiety.    Historical Provider, MD  buPROPion (WELLBUTRIN XL) 300 MG 24 hr tablet Take 300 mg by mouth daily.    Historical Provider, MD  carvedilol (COREG) 25 MG tablet Take 12.5 mg by mouth 2 (two) times daily with a meal.     Historical Provider, MD  Diclofenac-Misoprostol (ARTHROTEC) 75-0.2 MG TBEC Take 1 tablet by mouth 2 (two) times daily.     Historical Provider, MD  doxycycline (VIBRAMYCIN) 100 MG capsule Take 1 capsule by mouth 2 (two) times daily. 10/02/13   Historical Provider, MD  fluticasone (FLONASE) 50 MCG/ACT nasal spray Place 2 sprays into both nostrils daily.    Historical Provider, MD  furosemide (LASIX) 40 MG tablet Take 40 mg by mouth daily.    Historical Provider, MD  gabapentin (NEURONTIN) 300 MG capsule Take 900 mg by mouth at bedtime.    Historical Provider, MD  HYDROcodone-acetaminophen (NORCO/VICODIN) 5-325 MG per tablet Take 1 tablet by mouth every 6 (six) hours as needed for moderate pain.  Historical Provider, MD  Multiple Vitamin (MULTIVITAMIN) tablet Take 1 tablet by mouth daily.    Historical Provider, MD  potassium chloride SA (K-DUR,KLOR-CON) 20 MEQ tablet Take 20 mEq by mouth 2 (two) times daily.    Historical Provider, MD  vitamin C (ASCORBIC ACID) 500 MG tablet Take 500 mg by mouth daily.    Historical Provider, MD  zinc sulfate 220 MG capsule Take 220 mg by mouth daily.    Historical Provider, MD   BP 199/90  Temp(Src) 98.2 F (36.8 C) (Oral)  Resp 24  SpO2 93% Physical Exam  Nursing note and vitals reviewed. Constitutional: He is oriented to person, place, and time. He appears well-developed and well-nourished. No distress.  HENT:  Head: Normocephalic and  atraumatic.  Mouth/Throat: Oropharynx is clear and moist.  Eyes: Conjunctivae are normal. No scleral icterus.  Neck: Neck supple.  Cardiovascular: Normal rate, regular rhythm, normal heart sounds and intact distal pulses.   No murmur heard. Pulmonary/Chest: Effort normal and breath sounds normal. No stridor. No respiratory distress. He has no wheezes. He has no rales.  Abdominal: Soft. He exhibits no distension. There is no tenderness.  Musculoskeletal: Normal range of motion. He exhibits no edema.  Neurological: He is alert and oriented to person, place, and time. He has normal strength. A cranial nerve deficit (right pupil enlarged, minimally reactive.  EOM are incomplete.  Ptosis of right eyelid.  ) is present. No sensory deficit. GCS eye subscore is 3. GCS verbal subscore is 4. GCS motor subscore is 6.  Skin: Skin is warm and dry. No rash noted.  Psychiatric: His behavior is normal.    ED Course  CRITICAL CARE Performed by: Serita Grit DAVID III Authorized by: Serita Grit DAVID III Total critical care time: 45 minutes Critical care time was exclusive of separately billable procedures and treating other patients. Critical care was necessary to treat or prevent imminent or life-threatening deterioration of the following conditions: respiratory failure. Critical care was time spent personally by me on the following activities: development of treatment plan with patient or surrogate, discussions with consultants, evaluation of patient's response to treatment, examination of patient, obtaining history from patient or surrogate, ordering and performing treatments and interventions, ordering and review of laboratory studies, ordering and review of radiographic studies, pulse oximetry, re-evaluation of patient's condition and review of old charts.   (including critical care time) Labs Review Labs Reviewed  URINALYSIS, ROUTINE W REFLEX MICROSCOPIC - Abnormal; Notable for the following:     APPearance CLOUDY (*)    Specific Gravity, Urine 1.036 (*)    All other components within normal limits  CBC WITH DIFFERENTIAL - Abnormal; Notable for the following:    MCHC 29.8 (*)    RDW 18.6 (*)    Platelets 146 (*)    Neutrophils Relative % 88 (*)    Neutro Abs 9.0 (*)    Lymphocytes Relative 4 (*)    Lymphs Abs 0.4 (*)    All other components within normal limits  COMPREHENSIVE METABOLIC PANEL - Abnormal; Notable for the following:    Glucose, Bld 148 (*)    All other components within normal limits  I-STAT ARTERIAL BLOOD GAS, ED - Abnormal; Notable for the following:    pH, Arterial 7.218 (*)    pCO2 arterial 78.5 (*)    Bicarbonate 32.0 (*)    All other components within normal limits  I-STAT ARTERIAL BLOOD GAS, ED - Abnormal; Notable for the following:    pH, Arterial  7.256 (*)    pCO2 arterial 76.9 (*)    pO2, Arterial 118.0 (*)    Bicarbonate 34.2 (*)    Acid-Base Excess 4.0 (*)    All other components within normal limits  LACTIC ACID, PLASMA  PROLACTIN  TSH  FOLLICLE STIMULATING HORMONE  LUTEINIZING HORMONE  CORTISOL  GROWTH HORMONE  INSULIN-LIKE GROWTH FACTOR  ACTH    Imaging Review Ct Head W Wo Contrast  12/06/2013   CLINICAL DATA:  Pituitary tumor. Progressive symptoms. Weakness and dizziness.  EXAM: CT HEAD WITHOUT AND WITH CONTRAST  TECHNIQUE: Contiguous axial images were obtained from the base of the skull through the vertex without and with intravenous contrast  CONTRAST:  80 mL Omnipaque 300  COMPARISON:  None.  FINDINGS: A sellar and suprasellar mass lesion is confirmed. Reformatted images were attempted but of low diagnostic value given the thickness of the acquisition.  The right cavernous sinus is expanded with diffuse enhancement. The suprasellar component extends superiorly. The right-sided lesion with extension into the cavernous sinus measures 2.1 x 2.7 cm on the axial images. The suprasellar component measures 1.1 x 1.0 cm.  The suprasellar brain  is otherwise unremarkable. No acute infarct or hemorrhage is evident.  Soft tissue fills the sphenoid sinus, presumably related to the same process. The paranasal sinuses and mastoid air cells are otherwise clear.  IMPRESSION: 1. Prominent scratch the large sellar and suprasellar mass lesion with extension into the right cavernous sinus and superior extension beyond the diaphragm sella. There is osseous destruction with extension into the sphenoid sinus. Includes a pituitary macroadenoma. A craniopharyngioma is considered less likely without significant calcifications. A meningioma is also considered. Aneurysm is considered but less likely. Metastatic disease is also considered. Recommend MRI of the brain and pituitary for further evaluation. 2. The suprasellar brain is unremarkable.   Electronically Signed   By: Lawrence Santiago M.D.   On: 12/06/2013 10:31   Dg Chest Port 1 View  12/06/2013   CLINICAL DATA:  Severe shortness of breath.  EXAM: PORTABLE CHEST - 1 VIEW  COMPARISON:  10/06/2013.  FINDINGS: Significant patient rotation to the right. No gross change in enlargement of the cardiac silhouette. The lungs are grossly clear. The pulmonary vasculature and interstitial markings remain prominent. The visualized bones are unremarkable.  IMPRESSION: Stable cardiomegaly, pulmonary vascular congestion and mild chronic interstitial lung disease.   Electronically Signed   By: Enrique Sack M.D.   On: 12/06/2013 11:32  All radiology studies independently viewed by me.      EKG Interpretation   Date/Time:  Saturday December 06 2013 15:16:09 EDT Ventricular Rate:  76 PR Interval:    QRS Duration: 129 QT Interval:  438 QTC Calculation: 492 R Axis:   97 Text Interpretation:  Age not entered, assumed to be  48 years old for  purpose of ECG interpretation Atrial fibrillation RBBB and LPFB Abnormal  inferior Q waves Baseline wander in lead(s) V2 V5 No old tracing to  compare Confirmed by [facility]  MD, TREY ([id number]) on  12/06/2013 4:06:38 PM      MDM   Final diagnoses:  Neoplasm of brain causing mass effect on adjacent structures  Acute respiratory failure with hypercapnia    48 yo male transferred from [facility] ED secondary to a brain mass seen on CT imaging.  On arrival, he was drowsy and confused.  Initial impression was hypercarbia secondary to obesity hypoventilation.  ABG supports this.  He was also found to have several neuro deficits  associated primarily with his right eye.  These were documented as normal on his notes from [place].  Therefore, repeat CT scan performed with IV contrast.  He is too large to obtain an MRI.  I also discussed his case with Dr. Doy Mince (Neurology) and Dr. Joya Salm ([facility]).  Will place on bipap to attempt to treat his hypercarbic respiratory failure.     Repeat ABG shows continued hypercarbic respiratory failure.  Bipap settings adjusted.  Discussed with critical care for admission to ICU.     Houston Siren III, MD 12/06/13 [phone]

## 2013-12-06 NOTE — Consult Note (Signed)
Reason for Consult:Headache, brain mass Referring Physician: [place]  CC: Headache, blurry vision  HPI: [name] [name] is an 48 y.o. male who reports that about 3 days ago he began to have a headache.  Describes the headache as being occipital and sharp.  Headache was severe and at times a 10/10.  Had associated nausea.  Also reports he had blurry vision as well.  Had vomiting today that was described as hematemesis.  Patient initially seen in [place] where a head CT was performed.  Head CT showed a sellar mass lesion and patient was transferred here for further evaluation and management.    Past Medical History  Diagnosis Date  . Sleep apnea   . Allergic rhinitis   . Diastolic CHF   . Gout   . Nephrolithiasis   . Peripheral neuropathy   . Hypertension     Past Surgical History  Procedure Laterality Date  . Sigmoid colonoscopy    . Hernia repair    . Abdominal surgery      for cyst with MRSA    Family History  Problem Relation Age of Onset  . Hypertension Mother   . Hypertension Father   . Gout Father   . Hypothyroidism Father   . Gout Brother     Social History:  reports that he has never smoked. He has never used smokeless tobacco. He reports that he does not drink alcohol or use illicit drugs.  Allergies  Allergen Reactions  . Morphine And Related Hives  . Penicillins Nausea And Vomiting    Medications: I have reviewed the patient's current medications. Prior to Admission:  Current outpatient prescriptions:acetaminophen (TYLENOL) 650 MG CR tablet, Take 650 mg by mouth every 6 (six) hours as needed for pain., Disp: , Rfl: ;  albuterol (PROAIR HFA) 108 (90 BASE) MCG/ACT inhaler, Inhale 1-2 puffs into the lungs every 6 (six) hours as needed for wheezing or shortness of breath., Disp: 1 Inhaler, Rfl: 5;  allopurinol (ZYLOPRIM) 300 MG tablet, Take 300 mg by mouth daily., Disp: , Rfl:  ALPRAZolam (XANAX) 0.25 MG tablet, Take 0.25 mg by mouth 3 (three) times daily as  needed for anxiety., Disp: , Rfl: ;  buPROPion (WELLBUTRIN XL) 300 MG 24 hr tablet, Take 300 mg by mouth daily., Disp: , Rfl: ;  carvedilol (COREG) 25 MG tablet, Take 12.5 mg by mouth 2 (two) times daily with a meal. , Disp: , Rfl: ;  Diclofenac-Misoprostol (ARTHROTEC) 75-0.2 MG TBEC, Take 1 tablet by mouth 2 (two) times daily. , Disp: , Rfl:  doxycycline (VIBRAMYCIN) 100 MG capsule, Take 1 capsule by mouth 2 (two) times daily., Disp: , Rfl: ;  fluticasone (FLONASE) 50 MCG/ACT nasal spray, Place 2 sprays into both nostrils daily., Disp: , Rfl: ;  furosemide (LASIX) 40 MG tablet, Take 40 mg by mouth daily., Disp: , Rfl: ;  gabapentin (NEURONTIN) 300 MG capsule, Take 900 mg by mouth at bedtime., Disp: , Rfl:  HYDROcodone-acetaminophen (NORCO/VICODIN) 5-325 MG per tablet, Take 1 tablet by mouth every 6 (six) hours as needed for moderate pain., Disp: , Rfl: ;  Multiple Vitamin (MULTIVITAMIN) tablet, Take 1 tablet by mouth daily., Disp: , Rfl: ;  potassium chloride SA (K-DUR,KLOR-CON) 20 MEQ tablet, Take 20 mEq by mouth 2 (two) times daily., Disp: , Rfl: ;  vitamin C (ASCORBIC ACID) 500 MG tablet, Take 500 mg by mouth daily., Disp: , Rfl:  zinc sulfate 220 MG capsule, Take 220 mg by mouth daily., Disp: , Rfl:  ROS: History obtained from the patient  General ROS: negative for - chills, fatigue, fever, night sweats, weight gain or weight loss Psychological ROS: negative for - behavioral disorder, hallucinations, memory difficulties, mood swings or suicidal ideation Ophthalmic ROS: as noted in HPI ENT ROS: negative for - epistaxis, nasal discharge, oral lesions, sore throat, tinnitus or vertigo Allergy and Immunology ROS: negative for - hives or itchy/watery eyes Hematological and Lymphatic ROS: negative for - bleeding problems, bruising or swollen lymph nodes Endocrine ROS: negative for - galactorrhea, hair pattern changes, polydipsia/polyuria or temperature intolerance Respiratory ROS: shortness of  breath Cardiovascular ROS: negative for - chest pain, dyspnea on exertion, edema or irregular heartbeat Gastrointestinal ROS: as noted in HPI Genito-Urinary ROS: negative for - dysuria, hematuria, incontinence or urinary frequency/urgency Musculoskeletal ROS: negative for - joint swelling or muscular weakness Neurological ROS: as noted in HPI Dermatological ROS: negative for rash and skin lesion changes  Physical Examination: Blood pressure 162/91, pulse 91, temperature 98.2 F (36.8 C), temperature source Oral, resp. rate 18, SpO2 96.00%.  Neurologic Examination Mental Status: Lethargic but easily arousable.  Unable to tell me the year.  Follows commands.  Speech fluent without evidence of aphasia. Cranial Nerves: II: Discs flat bilaterally; Visual fields grossly normal, left pupil 2-54mm and reactive, right pupil 4-68mm and unreactive III,IV, VI: right ptosis, Decreased upward, downward and medial motion with the right eye, extraocular movements intact in the left eye V,VII: smile symmetric, facial light touch sensation normal bilaterally VIII: hearing normal bilaterally IX,X: gag reflex present XI: bilateral shoulder shrug XII: rightward tongue extension Motor: Right : Upper extremity   5/5    Left:     Upper extremity   5/5  Lower extremity   5/5     Lower extremity   5/5 Tone and bulk:normal tone throughout; no atrophy noted Sensory: Pinprick and light touch decreased in the left upper and lower extremity Deep Tendon Reflexes: 2+ in the upper extremities and absent in the lower extremities Plantars: Right: mute   Left: mute Cerebellar: normal finger-to-nose Gait: Unable to test CV: pulses palpable throughout     Laboratory Studies:   Basic Metabolic Panel:  Recent Labs Lab 12/06/13 0855  NA 138  K 3.9  CL 98  CO2 30  GLUCOSE 148*  BUN 19  CREATININE 0.75  CALCIUM 8.4    Liver Function Tests:  Recent Labs Lab 12/06/13 0855  AST 23  ALT 35  ALKPHOS 100   BILITOT 0.4  PROT 7.1  ALBUMIN 3.9   No results found for this basename: LIPASE, AMYLASE,  in the last 168 hours No results found for this basename: AMMONIA,  in the last 168 hours  CBC:  Recent Labs Lab 12/06/13 0855  WBC 10.2  NEUTROABS 9.0*  HGB 13.7  HCT 45.9  MCV 90.0  PLT 146*    Cardiac Enzymes: No results found for this basename: CKTOTAL, CKMB, CKMBINDEX, TROPONINI,  in the last 168 hours  BNP: No components found with this basename: POCBNP,   CBG: No results found for this basename: GLUCAP,  in the last 168 hours  Microbiology: No results found for this or any previous visit.  Coagulation Studies: No results found for this basename: LABPROT, INR,  in the last 72 hours  Urinalysis: No results found for this basename: COLORURINE, APPERANCEUR, LABSPEC, PHURINE, GLUCOSEU, HGBUR, BILIRUBINUR, KETONESUR, PROTEINUR, UROBILINOGEN, NITRITE, LEUKOCYTESUR,  in the last 168 hours  Lipid Panel:  No results found for this basename: chol,  trig,  hdl,  cholhdl,  vldl,  ldlcalc    HgbA1C:  No results found for this basename: HGBA1C    Urine Drug Screen:   No results found for this basename: labopia,  cocainscrnur,  labbenz,  amphetmu,  thcu,  labbarb    Alcohol Level: No results found for this basename: ETH,  in the last 168 hours   Imaging: Ct Head W Wo Contrast  12/06/2013   CLINICAL DATA:  Pituitary tumor. Progressive symptoms. Weakness and dizziness.  EXAM: CT HEAD WITHOUT AND WITH CONTRAST  TECHNIQUE: Contiguous axial images were obtained from the base of the skull through the vertex without and with intravenous contrast  CONTRAST:  80 mL Omnipaque 300  COMPARISON:  None.  FINDINGS: A sellar and suprasellar mass lesion is confirmed. Reformatted images were attempted but of low diagnostic value given the thickness of the acquisition.  The right cavernous sinus is expanded with diffuse enhancement. The suprasellar component extends superiorly. The right-sided lesion  with extension into the cavernous sinus measures 2.1 x 2.7 cm on the axial images. The suprasellar component measures 1.1 x 1.0 cm.  The suprasellar brain is otherwise unremarkable. No acute infarct or hemorrhage is evident.  Soft tissue fills the sphenoid sinus, presumably related to the same process. The paranasal sinuses and mastoid air cells are otherwise clear.  IMPRESSION: 1. Prominent scratch the large sellar and suprasellar mass lesion with extension into the right cavernous sinus and superior extension beyond the diaphragm sella. There is osseous destruction with extension into the sphenoid sinus. Includes a pituitary macroadenoma. A craniopharyngioma is considered less likely without significant calcifications. A meningioma is also considered. Aneurysm is considered but less likely. Metastatic disease is also considered. Recommend MRI of the brain and pituitary for further evaluation. 2. The suprasellar brain is unremarkable.   Electronically Signed   By: Lawrence Santiago M.D.   On: 12/06/2013 10:31     Assessment/Plan: 48 year old male presenting with headache.  Has no real history of headache.  Head CT at [facility] revealed a mass.  On arrival here neurological examination was different than that seen at [facility] and with question of aneurysm, head CT was repeated to rule out hemorrhage.  Contrast was used as well since patient was not able to fit into our MRI machine.  Head CT reviewed and shows no evidence of hemorrhage.  Contrast revealed a large sellar and suprasellar mass with extension into the right cavernous sinus and superiorly.  There was no evidence of aneurysm.   Patient also found to be acidotic and hypercarbic which is likely affecting his mental status.    Recommendations: 1.  Neurosurgery consult. With large size of mass and involvement of cavernous sinus both on imaging and clinically as well, some surgical intervention will likely be required.   2.  Respiratory management 3.  Prolactin level, growth hormone, ACTH, FSH, LH, TSH, cortisol  Case discussed with Dr. Joya Salm and Dr. Christa See, MD [facility] [phone] 12/06/2013, 11:08 AM

## 2013-12-06 NOTE — ED Notes (Signed)
MD at bedside. 

## 2013-12-06 NOTE — ED Notes (Signed)
Velna Hatchet, NP with critical care at bedside.

## 2013-12-06 NOTE — ED Notes (Signed)
Wound care RN paged.

## 2013-12-06 NOTE — ED Notes (Signed)
Pt returned to room and placed on bipap by Claiborne Billings, rt.

## 2013-12-06 NOTE — H&P (Signed)
PULMONARY / CRITICAL CARE MEDICINE   Name: [name] [name] MRN: [id number] DOB: 1965-11-21    ADMISSION DATE:  12/06/2013  REFERRING MD :  Dr. Doy Mince PRIMARY SERVICE: PCCM   CHIEF COMPLAINT:  Hypercarbic Respiratory Failure   BRIEF PATIENT DESCRIPTION: 48 y/o M with a PMH of HTN, dCHF, OSA who presented to [facility] ER with 2 day hx of HA, blurry vision, & 1 episode of dark emesis (? If blood).  CT evaluation noted sellar & suprasellar mass lesion.  Developed hypercarbic respiratory failure in ER requiring bipap.  PCCM consulted for ICU admission.   SIGNIFICANT EVENTS: 7/04 - Admit with HA, blurry vision, vomiting (dark emesis, ? blood). CT positive for suprasellar mass  STUDIES:  7/04 - CT head >> prominent, large sellar & suprasellar mass lesion with extension into the R cavernous sinus, ssuperior extension beyond the diaphragm sella. Osseous destruction with extension into sphenoid sinus.  LINES / TUBES: PIV  CULTURES:   ANTIBIOTICS: Diflucan (yeast RLQ wound) 7/4>>>  HISTORY OF PRESENT ILLNESS:  48 y/o M with PMH of HTN, dCHF, Gout, Hernia Repair, Peripheral Neuropathy, Wound Vac to RLQ after surgical resection of a cyst / non-healing prior surgical wound in Jan 2015, recent diagnosis of OSA not on home CPAP (Company has not had the opportunity to deliver machine for home sleep study) who presented to [facility] ER as a transfer from [facility] ER with a 2 day history of headache, blurry vision, & 1 episode of vomiting with bright red blood.   Patient reports he began having headaches 48 hours prior to presentation with associated nausea, dry heaves and one episode of vomiting with dark emesis that she describes as blood, approximately 1/2 cup. Headaches are in described as intense dull pain in the back of head and behind the eyes.  Mother reports he had approximately 24 hours of dry heaving prior to vomiting episode.  The concern for bloody emesis is what prompted them to seek medical attention.   He has had no further vomiting.  Denies ingestion of red food coloring.    At baseline he is essentially non-ambulatory.  He is able to stand and pivot, walk short distances to the kitchen and back to chair.  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  Patient is followed by [place] for M/W/F wound vac changes and is seen at the [facility] every other Thursday.  He recently was placed on Qsymia (phentermine / topamax) for weight loss per primary MD (Dr. Shelia Media)  In the ER he was found to have a large sellar & suprasellar mass on CT as well as hypercarbic respiratory failure.  Prior to transfer from [facility], he was given dilaudid, phenergan and reglan.  PCCM called for ICU admission.     PAST MEDICAL HISTORY :  Past Medical History  Diagnosis Date  . Sleep apnea   . Allergic rhinitis   . Diastolic CHF   . Gout   . Nephrolithiasis   . Peripheral neuropathy   . Hypertension    Past Surgical History  Procedure Laterality Date  . Sigmoid colonoscopy    . Hernia repair    . Abdominal surgery      for cyst with MRSA   Prior to Admission medications   Medication Sig Start Date End Date Taking? Authorizing Provider  acetaminophen (TYLENOL) 650 MG CR tablet Take 650 mg by mouth every 6 (six) hours as needed for pain.  Historical Provider, MD  albuterol (PROAIR HFA) 108 (90 BASE) MCG/ACT inhaler Inhale 1-2 puffs into the lungs every 6 (six) hours as needed for wheezing or shortness of breath. 10/06/13   Chesley Mires, MD  allopurinol (ZYLOPRIM) 300 MG tablet Take 300 mg by mouth daily.    Historical Provider, MD  ALPRAZolam Duanne Moron) 0.25 MG tablet Take 0.25 mg by mouth 3 (three) times daily as needed for anxiety.    Historical Provider, MD  buPROPion (WELLBUTRIN XL) 300 MG 24 hr tablet Take 300 mg by mouth daily.    Historical Provider, MD  carvedilol (COREG) 25 MG tablet Take 12.5 mg by mouth 2 (two) times daily with a meal.      Historical Provider, MD  Diclofenac-Misoprostol (ARTHROTEC) 75-0.2 MG TBEC Take 1 tablet by mouth 2 (two) times daily.     Historical Provider, MD  doxycycline (VIBRAMYCIN) 100 MG capsule Take 1 capsule by mouth 2 (two) times daily. 10/02/13   Historical Provider, MD  fluticasone (FLONASE) 50 MCG/ACT nasal spray Place 2 sprays into both nostrils daily.    Historical Provider, MD  furosemide (LASIX) 40 MG tablet Take 40 mg by mouth daily.    Historical Provider, MD  gabapentin (NEURONTIN) 300 MG capsule Take 900 mg by mouth at bedtime.    Historical Provider, MD  HYDROcodone-acetaminophen (NORCO/VICODIN) 5-325 MG per tablet Take 1 tablet by mouth every 6 (six) hours as needed for moderate pain.    Historical Provider, MD  Multiple Vitamin (MULTIVITAMIN) tablet Take 1 tablet by mouth daily.    Historical Provider, MD  potassium chloride SA (K-DUR,KLOR-CON) 20 MEQ tablet Take 20 mEq by mouth 2 (two) times daily.    Historical Provider, MD  vitamin C (ASCORBIC ACID) 500 MG tablet Take 500 mg by mouth daily.    Historical Provider, MD  zinc sulfate 220 MG capsule Take 220 mg by mouth daily.    Historical Provider, MD   Allergies  Allergen Reactions  . Morphine And Related Hives  . Penicillins Nausea And Vomiting    FAMILY HISTORY:  Family History  Problem Relation Age of Onset  . Hypertension Mother   . Hypertension Father   . Gout Father   . Hypothyroidism Father   . Gout Brother    SOCIAL HISTORY:  reports that he has never smoked. He has never used smokeless tobacco. He reports that he does not drink alcohol or use illicit drugs.  REVIEW OF SYSTEMS:  Unable to complete as patient is on bipap.    SUBJECTIVE:   VITAL SIGNS: Temp:  [98.2 F (36.8 C)] 98.2 F (36.8 C) (07/04 0743) Pulse Rate:  [69-91] 91 (07/04 1025) Resp:  [14-25] 18 (07/04 1038) BP: (162-199)/(90-113) 162/91 mmHg (07/04 1038) SpO2:  [93 %-98 %] 96 % (07/04 1038) HEMODYNAMICS:   VENTILATOR SETTINGS:   INTAKE /  OUTPUT: Intake/Output     07/03 0701 - 07/04 0700 07/04 0701 - 07/05 0700   Urine  7500   Total Output   7500   Net   -7500          PHYSICAL EXAMINATION: General:  Morbidly obese male in NAD Neuro:  Drowsy, awakens to voice, no acute distress.  Mild asterixis.  MAE.  L pupil 4 mm, L 6-7 mm HEENT:  Mm pink/moist, short / thick neck  Cardiovascular:  s1s2 rrr, distant tones  Lungs:  resp's even/non-labored, lungs bilaterally distant but clear  Abdomen:  Obese, large panus, RLQ wound VAC in place c/d/i, yeast-like  odor from area, erythema and few areas of white patchy scaling  Musculoskeletal:  No acute deformities  Skin:  Warm/dry, 2+ pitting BLE edema   LABS:  CBC  Recent Labs Lab 12/06/13 0855  WBC 10.2  HGB 13.7  HCT 45.9  PLT 146*   Coag's No results found for this basename: APTT, INR,  in the last 168 hours BMET  Recent Labs Lab 12/06/13 0855  NA 138  K 3.9  CL 98  CO2 30  BUN 19  CREATININE 0.75  GLUCOSE 148*   Electrolytes  Recent Labs Lab 12/06/13 0855  CALCIUM 8.4   Sepsis Markers  Recent Labs Lab 12/06/13 0855  LATICACIDVEN 0.8   ABG  Recent Labs Lab 12/06/13 0758 12/06/13 1012  PHART 7.218* 7.256*  PCO2ART 78.5* 76.9*  PO2ART 82.0 118.0*   Liver Enzymes  Recent Labs Lab 12/06/13 0855  AST 23  ALT 35  ALKPHOS 100  BILITOT 0.4  ALBUMIN 3.9   Cardiac Enzymes No results found for this basename: TROPONINI, PROBNP,  in the last 168 hours Glucose No results found for this basename: GLUCAP,  in the last 168 hours  Imaging Ct Head W Wo Contrast  12/06/2013   CLINICAL DATA:  Pituitary tumor. Progressive symptoms. Weakness and dizziness.  EXAM: CT HEAD WITHOUT AND WITH CONTRAST  TECHNIQUE: Contiguous axial images were obtained from the base of the skull through the vertex without and with intravenous contrast  CONTRAST:  80 mL Omnipaque 300  COMPARISON:  None.  FINDINGS: A sellar and suprasellar mass lesion is confirmed.  Reformatted images were attempted but of low diagnostic value given the thickness of the acquisition.  The right cavernous sinus is expanded with diffuse enhancement. The suprasellar component extends superiorly. The right-sided lesion with extension into the cavernous sinus measures 2.1 x 2.7 cm on the axial images. The suprasellar component measures 1.1 x 1.0 cm.  The suprasellar brain is otherwise unremarkable. No acute infarct or hemorrhage is evident.  Soft tissue fills the sphenoid sinus, presumably related to the same process. The paranasal sinuses and mastoid air cells are otherwise clear.  IMPRESSION: 1. Prominent scratch the large sellar and suprasellar mass lesion with extension into the right cavernous sinus and superior extension beyond the diaphragm sella. There is osseous destruction with extension into the sphenoid sinus. Includes a pituitary macroadenoma. A craniopharyngioma is considered less likely without significant calcifications. A meningioma is also considered. Aneurysm is considered but less likely. Metastatic disease is also considered. Recommend MRI of the brain and pituitary for further evaluation. 2. The suprasellar brain is unremarkable.   Electronically Signed   By: Lawrence Santiago M.D.   On: 12/06/2013 10:31   Dg Chest Port 1 View  12/06/2013   CLINICAL DATA:  Severe shortness of breath.  EXAM: PORTABLE CHEST - 1 VIEW  COMPARISON:  10/06/2013.  FINDINGS: Significant patient rotation to the right. No gross change in enlargement of the cardiac silhouette. The lungs are grossly clear. The pulmonary vasculature and interstitial markings remain prominent. The visualized bones are unremarkable.  IMPRESSION: Stable cardiomegaly, pulmonary vascular congestion and mild chronic interstitial lung disease.   Electronically Signed   By: Enrique Sack M.D.   On: 12/06/2013 11:32    ASSESSMENT / PLAN:  PULMONARY A: Acute on Chronic Hypercarbic Respiratory Failure OSA / OHS High Risk  Respiratory Failure - in setting of morbid obesity, OSA/OHS + acute narcotic usage P:   BiPAP support for now Try to avoid opiates as high  risk intubation Monitor for mental status changes.   Pulmonary hygiene as able  NEUROLOGIC A:   Suprasellar Mass  Headache / Pain  Ptosis of R Eye  Depression / Anxiety Peripheral Neuropathy  P:   RASS goal: N/A Ultram for pain, cautious usage with resp status and allergy history Neurosurgery / Neurology following.  Likely to require OR intervention.   Further imaging per Neuro Continue home wellbutrin, gabapentin PRN low dose ativan See endocrine for w/u  CARDIOVASCULAR A:  Hypertension  Bradycardia  dCHF  P:  Continue home meds:  Lasix, coreg ICU monitoring  Assess baseline EKG  RENAL A:   No acute issues  P:   Trend BMP Replace lytes as indicated   GASTROINTESTINAL A:   Morbid Obesity  Hematemesis - 1 episode, on NSAID at baseline.  No further bleeding  At Risk Aspiration - with hypercarbic resp failure P:   Clear liquid diet  BID PPI with hematemesis & baseline NSAID usage Hold diet meds  HEMATOLOGIC A:   Mild Thrombocytopenia  P:  DVT:  SQ Heparin   INFECTIOUS A:   RLQ Chronic Wound VAC Yeast RLQ  P:   Monitor fever curve / leukocytosis  IV diflucan x 3 days WOC consult for [facility] assessment & changes.  Changed at home M/W/F  ENDOCRINE A:  Sellar & Suprasellar Mass Hyperglycemia   P:   -assess TSH, T4, ACTH & prolactin  -SSI   Noe Gens, NP-C [facility] Pgr: [phone] or [phone]  Attending:  I have seen and examined the patient with nurse practitioner/resident and agree with the note above.   I have personally obtained a history, examined the patient, evaluated laboratory and imaging results, formulated the assessment and plan and placed orders.  CRITICAL CARE: The patient is critically ill with multiple organ systems failure and requires high complexity decision  making for assessment and support, frequent evaluation and titration of therapies, application of advanced monitoring technologies and extensive interpretation of multiple databases. Critical Care Time devoted to patient care services described in this note is 35 minutes.   Roselie Awkward, MD [facility] Pager: [phone] Cell: [phone] If no response, call [phone]    12/06/2013, 12:12 PM

## 2013-12-06 NOTE — Progress Notes (Signed)
Follow-up ABG done, not much improved. Went up on [place] settings. RT will continue to follow

## 2013-12-06 NOTE — ED Notes (Signed)
Pt and family aware and understanding that critical care is consulting and pain/anxiety medications will be discussed again following their consult.

## 2013-12-06 NOTE — Progress Notes (Signed)
Pt taken off Bipap and placed on 2L [place] due to pt feeling like he was going to vomit and becoming extremely claustrophobic. Pt is not SOB at this time. 98% on 2L. RN aware.

## 2013-12-06 NOTE — Progress Notes (Signed)
RT. attempted to replace Bipap on pt. upon arrival to 3M11, refused@ this time, RT to monitor.

## 2013-12-06 NOTE — ED Notes (Signed)
Pt noted to have O2 sats at 75% on 30%fiO2. This RN called Claiborne Billings with respiratory. FiO2 increased to 100% until she is able to see pt.

## 2013-12-06 NOTE — Consult Note (Signed)
Reason for Consult:pituiyary tumor Referring Physician: ER  [name] [name] is an 48 y.o. male.  HPI: Transferred from [place], [place] because headache, unable to breath, dizziness, ptosis in the right eye.ct head was done and we were called for evaluation,   Past Medical History  Diagnosis Date  . Sleep apnea   . Allergic rhinitis   . Diastolic CHF   . Gout   . Nephrolithiasis   . Peripheral neuropathy   . Hypertension     Past Surgical History  Procedure Laterality Date  . Sigmoid colonoscopy    . Hernia repair    . Abdominal surgery      for cyst with MRSA    Family History  Problem Relation Age of Onset  . Hypertension Mother   . Hypertension Father   . Gout Father   . Hypothyroidism Father   . Gout Brother     Social History:  reports that he has never smoked. He has never used smokeless tobacco. He reports that he does not drink alcohol or use illicit drugs.  Allergies:  Allergies  Allergen Reactions  . Morphine And Related Hives  . Penicillins Nausea And Vomiting    Medication see HP  Results for orders placed during the hospital encounter of 12/06/13 (from the past 48 hour(s))  I-STAT ARTERIAL BLOOD GAS, ED     Status: Abnormal   Collection Time    12/06/13  7:58 AM      Result Value Ref Range   pH, Arterial 7.218 (*) 7.350 - 7.450   pCO2 arterial 78.5 (*) 35.0 - 45.0 mmHg   pO2, Arterial 82.0  80.0 - 100.0 mmHg   Bicarbonate 32.0 (*) 20.0 - 24.0 mEq/L   TCO2 34  0 - 100 mmol/L   O2 Saturation 93.0     Acid-Base Excess 1.0  0.0 - 2.0 mmol/L   Patient temperature 98.6 F     Collection site RADIAL, ALLEN'S TEST ACCEPTABLE     Drawn by Operator     Sample type ARTERIAL     Comment NOTIFIED PHYSICIAN    LACTIC ACID, PLASMA     Status: None   Collection Time    12/06/13  8:55 AM      Result Value Ref Range   Lactic Acid, Venous 0.8  0.5 - 2.2 mmol/L  CBC WITH DIFFERENTIAL     Status: Abnormal   Collection Time    12/06/13  8:55 AM      Result  Value Ref Range   WBC 10.2  4.0 - 10.5 K/uL   RBC 5.10  4.22 - 5.81 MIL/uL   Hemoglobin 13.7  13.0 - 17.0 g/dL   HCT 45.9  39.0 - 52.0 %   MCV 90.0  78.0 - 100.0 fL   MCH 26.9  26.0 - 34.0 pg   MCHC 29.8 (*) 30.0 - 36.0 g/dL   RDW 18.6 (*) 11.5 - 15.5 %   Platelets 146 (*) 150 - 400 K/uL   Neutrophils Relative % 88 (*) 43 - 77 %   Neutro Abs 9.0 (*) 1.7 - 7.7 K/uL   Lymphocytes Relative 4 (*) 12 - 46 %   Lymphs Abs 0.4 (*) 0.7 - 4.0 K/uL   Monocytes Relative 8  3 - 12 %   Monocytes Absolute 0.8  0.1 - 1.0 K/uL   Eosinophils Relative 0  0 - 5 %   Eosinophils Absolute 0.0  0.0 - 0.7 K/uL   Basophils Relative 0  0 -  1 %   Basophils Absolute 0.0  0.0 - 0.1 K/uL  COMPREHENSIVE METABOLIC PANEL     Status: Abnormal   Collection Time    12/06/13  8:55 AM      Result Value Ref Range   Sodium 138  137 - 147 mEq/L   Potassium 3.9  3.7 - 5.3 mEq/L   Chloride 98  96 - 112 mEq/L   CO2 30  19 - 32 mEq/L   Glucose, Bld 148 (*) 70 - 99 mg/dL   BUN 19  6 - 23 mg/dL   Creatinine, Ser 0.75  0.50 - 1.35 mg/dL   Calcium 8.4  8.4 - 10.5 mg/dL   Total Protein 7.1  6.0 - 8.3 g/dL   Albumin 3.9  3.5 - 5.2 g/dL   AST 23  0 - 37 U/L   ALT 35  0 - 53 U/L   Alkaline Phosphatase 100  39 - 117 U/L   Total Bilirubin 0.4  0.3 - 1.2 mg/dL   GFR calc non Af Amer >90  >90 mL/min   GFR calc Af Amer >90  >90 mL/min   Comment: (NOTE)     The eGFR has been calculated using the CKD EPI equation.     This calculation has not been validated in all clinical situations.     eGFR's persistently <90 mL/min signify possible Chronic Kidney     Disease.   Anion gap 10  5 - 15  I-STAT ARTERIAL BLOOD GAS, ED     Status: Abnormal   Collection Time    12/06/13 10:12 AM      Result Value Ref Range   pH, Arterial 7.256 (*) 7.350 - 7.450   pCO2 arterial 76.9 (*) 35.0 - 45.0 mmHg   pO2, Arterial 118.0 (*) 80.0 - 100.0 mmHg   Bicarbonate 34.2 (*) 20.0 - 24.0 mEq/L   TCO2 37  0 - 100 mmol/L   O2 Saturation 98.0      Acid-Base Excess 4.0 (*) 0.0 - 2.0 mmol/L   Patient temperature 98.6 F     Collection site RADIAL, ALLEN'S TEST ACCEPTABLE     Drawn by Operator     Sample type ARTERIAL     Comment NOTIFIED PHYSICIAN      Ct Head W Wo Contrast  12/06/2013   CLINICAL DATA:  Pituitary tumor. Progressive symptoms. Weakness and dizziness.  EXAM: CT HEAD WITHOUT AND WITH CONTRAST  TECHNIQUE: Contiguous axial images were obtained from the base of the skull through the vertex without and with intravenous contrast  CONTRAST:  80 mL Omnipaque 300  COMPARISON:  None.  FINDINGS: A sellar and suprasellar mass lesion is confirmed. Reformatted images were attempted but of low diagnostic value given the thickness of the acquisition.  The right cavernous sinus is expanded with diffuse enhancement. The suprasellar component extends superiorly. The right-sided lesion with extension into the cavernous sinus measures 2.1 x 2.7 cm on the axial images. The suprasellar component measures 1.1 x 1.0 cm.  The suprasellar brain is otherwise unremarkable. No acute infarct or hemorrhage is evident.  Soft tissue fills the sphenoid sinus, presumably related to the same process. The paranasal sinuses and mastoid air cells are otherwise clear.  IMPRESSION: 1. Prominent scratch the large sellar and suprasellar mass lesion with extension into the right cavernous sinus and superior extension beyond the diaphragm sella. There is osseous destruction with extension into the sphenoid sinus. Includes a pituitary macroadenoma. A craniopharyngioma is considered less likely without significant   calcifications. A meningioma is also considered. Aneurysm is considered but less likely. Metastatic disease is also considered. Recommend MRI of the brain and pituitary for further evaluation. 2. The suprasellar brain is unremarkable.   Electronically Signed   By: Lawrence Santiago M.D.   On: 12/06/2013 10:31    Review of Systems  Constitutional: Positive for malaise/fatigue.   HENT: Positive for congestion.   Eyes: Positive for blurred vision and double vision.       Right ptosis  Respiratory: Positive for shortness of breath, wheezing and stridor.   Cardiovascular: Negative.   Gastrointestinal: Negative.   Genitourinary: Positive for frequency.  Musculoskeletal: Negative.   Skin: Negative.   Neurological: Positive for dizziness and headaches.  Endo/Heme/Allergies: Negative.   Psychiatric/Behavioral: Negative.    Blood pressure 162/91, pulse 91, temperature 98.2 F (36.8 C), temperature source Oral, resp. rate 18, SpO2 96.00%. Physical Examhent,nl. Neck, able to move with no pain. Lungs,wheezing.cv,nl. Abdomen. Can not feel any secondary to his morbid obesity. Extremities, grade 1 edema. NEURO right ptosis with pupil 36ms. ?bitempopral hemiagnosia. No weakness. Strength nl. Ct head shows a pituitary tumor going up and toward the cavernous sinus to the right Patient needs to be admitted by the medical service to control his respiratory symptoms as well as a endocrine work up. i will look for a mri machine which can hold his weight    Assessment/Plan: See above  Lenox Bink M 12/06/2013, 11:24 AM

## 2013-12-06 NOTE — ED Notes (Signed)
Pt states that he began having a headache x2days pt states that at that same time he began vomiting bright red blood. Pt has been sent here from [place] for further eval of pituitary mass that was found today. Pt on 2liters [place] at home and has a wound vac to RLQ chaned at the hospital every other day per pt. Dressing dated 12-05-13. Area noted to be red

## 2013-12-06 NOTE — ED Notes (Signed)
Pt was transported with this RN and keith, emt to ct. Pt was met there by dr Doy Mince and dr Doy Mince.

## 2013-12-07 ENCOUNTER — Inpatient Hospital Stay (HOSPITAL_COMMUNITY): Payer: BC Managed Care – PPO

## 2013-12-07 DIAGNOSIS — J9612 Chronic respiratory failure with hypercapnia: Secondary | ICD-10-CM | POA: Diagnosis present

## 2013-12-07 DIAGNOSIS — R062 Wheezing: Secondary | ICD-10-CM

## 2013-12-07 LAB — BASIC METABOLIC PANEL
Anion gap: 10 (ref 5–15)
BUN: 14 mg/dL (ref 6–23)
CO2: 33 mEq/L — ABNORMAL HIGH (ref 19–32)
Calcium: 8.8 mg/dL (ref 8.4–10.5)
Chloride: 98 mEq/L (ref 96–112)
Creatinine, Ser: 0.81 mg/dL (ref 0.50–1.35)
GFR calc Af Amer: >90 mL/min (ref >90)
GFR calc non Af Amer: >90 mL/min (ref >90)
Glucose, Bld: 117 mg/dL — ABNORMAL HIGH (ref 70–99)
Potassium: 4.3 mEq/L (ref 3.7–5.3)
Sodium: 141 mEq/L (ref 137–147)

## 2013-12-07 LAB — LUTEINIZING HORMONE: LH: <0.1 m[IU]/mL — ABNORMAL LOW (ref 1.5–9.3)

## 2013-12-07 LAB — PROLACTIN: Prolactin: 11.1 ng/mL (ref 2.1–17.1)

## 2013-12-07 LAB — CBC
HCT: 44.6 % (ref 39.0–52.0)
Hemoglobin: 13.4 g/dL (ref 13.0–17.0)
MCH: 27.5 pg (ref 26.0–34.0)
MCHC: 30 g/dL (ref 30.0–36.0)
MCV: 91.4 fL (ref 78.0–100.0)
Platelets: 144 10*3/uL — ABNORMAL LOW (ref 150–400)
RBC: 4.88 MIL/uL (ref 4.22–5.81)
RDW: 18.2 % — ABNORMAL HIGH (ref 11.5–15.5)
WBC: 8.2 10*3/uL (ref 4.0–10.5)

## 2013-12-07 LAB — FOLLICLE STIMULATING HORMONE: FSH: <0.3 m[IU]/mL — ABNORMAL LOW (ref 1.4–18.1)

## 2013-12-07 LAB — GLUCOSE, CAPILLARY: Glucose-Capillary: 124 mg/dL — ABNORMAL HIGH (ref 70–99)

## 2013-12-07 MED ORDER — HYDROCODONE-ACETAMINOPHEN 5-325 MG PO TABS
1.0000 | ORAL_TABLET | Freq: Four times a day (QID) | ORAL | Status: DC | PRN
  Administered 2013-12-07 – 2013-12-09 (×4): 1 via ORAL
  Filled 2013-12-07 (×4): qty 1

## 2013-12-07 MED ORDER — ALPRAZOLAM 0.5 MG PO TABS
0.5000 mg | ORAL_TABLET | Freq: Once | ORAL | Status: AC
  Administered 2013-12-07: 0.5 mg via ORAL
  Filled 2013-12-07: qty 1

## 2013-12-07 MED ORDER — PANTOPRAZOLE SODIUM 40 MG IV SOLR
40.0000 mg | INTRAVENOUS | Status: DC
  Administered 2013-12-07 – 2013-12-09 (×3): 40 mg via INTRAVENOUS
  Filled 2013-12-07 (×3): qty 40

## 2013-12-07 MED ORDER — KETOROLAC TROMETHAMINE 30 MG/ML IJ SOLN
30.0000 mg | Freq: Every day | INTRAMUSCULAR | Status: AC | PRN
  Administered 2013-12-07: 30 mg via INTRAVENOUS
  Filled 2013-12-07: qty 1

## 2013-12-07 MED ORDER — FENTANYL CITRATE 0.05 MG/ML IJ SOLN
25.0000 ug | INTRAMUSCULAR | Status: DC | PRN
  Administered 2013-12-07 – 2013-12-10 (×24): 50 ug via INTRAVENOUS
  Filled 2013-12-07 (×27): qty 2

## 2013-12-07 MED ORDER — GADOBENATE DIMEGLUMINE 529 MG/ML IV SOLN
20.0000 mL | Freq: Once | INTRAVENOUS | Status: AC | PRN
  Administered 2013-12-07: 20 mL via INTRAVENOUS

## 2013-12-07 MED ORDER — ACETAMINOPHEN 325 MG PO TABS
650.0000 mg | ORAL_TABLET | ORAL | Status: DC | PRN
  Administered 2013-12-07: 650 mg via ORAL
  Filled 2013-12-07 (×2): qty 2

## 2013-12-07 NOTE — Progress Notes (Addendum)
Subjective: Patient awake and alert.  On BiPAP  Objective: Current vital signs: BP 143/90  Pulse 67  Temp(Src) 98.4 F (36.9 C) (Oral)  Resp 14  Ht 5\' 11"  (1.803 m)  Wt 190.1 kg (419 lb 1.5 oz)  BMI 58.48 kg/m2  SpO2 97% Vital signs in last 24 hours: Temp:  [98.4 F (36.9 C)-99.2 F (37.3 C)] 98.4 F (36.9 C) (07/05 1158) Pulse Rate:  [61-105] 67 (07/05 1100) Resp:  [14-25] 14 (07/05 1100) BP: (125-181)/(79-116) 143/90 mmHg (07/05 1100) SpO2:  [90 %-100 %] 97 % (07/05 1100) FiO2 (%):  [100 %] 100 % (07/05 0202) Weight:  [189.604 kg (418 lb)-190.1 kg (419 lb 1.5 oz)] 190.1 kg (419 lb 1.5 oz) (07/05 0500)  Intake/Output from previous day: 07/04 0701 - 07/05 0700 In: 815 [I.V.:815] Out: 9100 [Urine:9100] Intake/Output this shift: Total I/O In: 250 [I.V.:250] Out: 750 [Urine:750] Nutritional status: Clear Liquid  Neurologic Exam: Awake and alert.  Oriented. Follows commands. Speech fluent without evidence of aphasia.  Cranial Nerves:  II: Discs flat bilaterally; Visual fields grossly normal, left pupil 59mm and reactive, right pupil 62mm and unreactive  III,IV, VI: right ptosis, Decreased upward, downward and medial motion with the right eye, left eye now with restricted lateral gaze, not as briskly reactive V,VII: smile symmetric, facial light touch sensation normal bilaterally  VIII: hearing normal bilaterally  IX,X: gag reflex present  XI: bilateral shoulder shrug  XII: rightward tongue extension  Motor:  Right : Upper extremity 5/5      Left: Upper extremity 5/5   Lower extremity 5/5    Lower extremity 5/5  Tone and bulk:normal tone throughout; no atrophy noted  Sensory: Pinprick and light touch decreased in the left upper and lower extremity  Deep Tendon Reflexes: 2+ in the upper extremities and absent in the lower extremities  Plantars:  Right: mute    Left: mute  Cerebellar:  normal finger-to-nose  Gait: Unable to test  CV: pulses palpable throughout     Lab Results: Basic Metabolic Panel:  Recent Labs Lab 12/06/13 0855 12/07/13 0234  NA 138 141  K 3.9 4.3  CL 98 98  CO2 30 33*  GLUCOSE 148* 117*  BUN 19 14  CREATININE 0.75 0.81  CALCIUM 8.4 8.8    Liver Function Tests:  Recent Labs Lab 12/06/13 0855  AST 23  ALT 35  ALKPHOS 100  BILITOT 0.4  PROT 7.1  ALBUMIN 3.9   No results found for this basename: LIPASE, AMYLASE,  in the last 168 hours No results found for this basename: AMMONIA,  in the last 168 hours  CBC:  Recent Labs Lab 12/06/13 0855 12/07/13 0234  WBC 10.2 8.2  NEUTROABS 9.0*  --   HGB 13.7 13.4  HCT 45.9 44.6  MCV 90.0 91.4  PLT 146* 144*    Cardiac Enzymes: No results found for this basename: CKTOTAL, CKMB, CKMBINDEX, TROPONINI,  in the last 168 hours  Lipid Panel: No results found for this basename: CHOL, TRIG, HDL, CHOLHDL, VLDL, LDLCALC,  in the last 168 hours  CBG:  Recent Labs Lab 12/06/13 2023  GLUCAP 124*    Microbiology: Results for orders placed during the hospital encounter of 12/06/13  MRSA PCR SCREENING     Status: Abnormal   Collection Time    12/06/13  5:15 PM      Result Value Ref Range Status   MRSA by PCR POSITIVE (*) NEGATIVE Final   Comment:  The GeneXpert MRSA Assay (FDA     approved for NASAL specimens     only), is one component of a     comprehensive MRSA colonization     surveillance program. It is not     intended to diagnose MRSA     infection nor to guide or     monitor treatment for     MRSA infections.     RESULT CALLED TO, READ BACK BY AND VERIFIED WITH:     SMITH,A RN 2036 12/06/13 MITCHELL,L    Coagulation Studies: No results found for this basename: LABPROT, INR,  in the last 72 hours  Imaging: Ct Head W Wo Contrast  12/06/2013   CLINICAL DATA:  Pituitary tumor. Progressive symptoms. Weakness and dizziness.  EXAM: CT HEAD WITHOUT AND WITH CONTRAST  TECHNIQUE: Contiguous axial images were obtained from the base of the skull  through the vertex without and with intravenous contrast  CONTRAST:  80 mL Omnipaque 300  COMPARISON:  None.  FINDINGS: A sellar and suprasellar mass lesion is confirmed. Reformatted images were attempted but of low diagnostic value given the thickness of the acquisition.  The right cavernous sinus is expanded with diffuse enhancement. The suprasellar component extends superiorly. The right-sided lesion with extension into the cavernous sinus measures 2.1 x 2.7 cm on the axial images. The suprasellar component measures 1.1 x 1.0 cm.  The suprasellar brain is otherwise unremarkable. No acute infarct or hemorrhage is evident.  Soft tissue fills the sphenoid sinus, presumably related to the same process. The paranasal sinuses and mastoid air cells are otherwise clear.  IMPRESSION: 1. Prominent scratch the large sellar and suprasellar mass lesion with extension into the right cavernous sinus and superior extension beyond the diaphragm sella. There is osseous destruction with extension into the sphenoid sinus. Includes a pituitary macroadenoma. A craniopharyngioma is considered less likely without significant calcifications. A meningioma is also considered. Aneurysm is considered but less likely. Metastatic disease is also considered. Recommend MRI of the brain and pituitary for further evaluation. 2. The suprasellar brain is unremarkable.   Electronically Signed   By: Lawrence Santiago M.D.   On: 12/06/2013 10:31   Dg Chest Port 1 View  12/07/2013   CLINICAL DATA:  Assess airspace disease  EXAM: PORTABLE CHEST - 1 VIEW  COMPARISON:  Portable chest x-ray of December 06, 2013  FINDINGS: The patient remains rotated. The lungs are reasonably well inflated. The interstitial markings are less conspicuous today. The pulmonary vascularity remains engorged. The cardiopericardial silhouette remains enlarged. No definite pleural effusion is demonstrated. There is persistent elevation of the right hemidiaphragm.  IMPRESSION: There has  been slight interval improvement in the appearance of the lungs consistent with resolving interstitial edema.   Electronically Signed   By: David  Martinique   On: 12/07/2013 07:24   Dg Chest Port 1 View  12/06/2013   CLINICAL DATA:  Severe shortness of breath.  EXAM: PORTABLE CHEST - 1 VIEW  COMPARISON:  10/06/2013.  FINDINGS: Significant patient rotation to the right. No gross change in enlargement of the cardiac silhouette. The lungs are grossly clear. The pulmonary vasculature and interstitial markings remain prominent. The visualized bones are unremarkable.  IMPRESSION: Stable cardiomegaly, pulmonary vascular congestion and mild chronic interstitial lung disease.   Electronically Signed   By: Enrique Sack M.D.   On: 12/06/2013 11:32    Medications:  I have reviewed the patient's current medications. Scheduled: . buPROPion  300 mg Oral Daily  . carvedilol  12.5 mg Oral BID WC  . fluconazole (DIFLUCAN) IV  100 mg Intravenous Q24H  . furosemide  40 mg Oral Daily  . gabapentin  900 mg Oral QHS  . heparin  5,000 Units Subcutaneous 3 times per day  . multivitamin with minerals  1 tablet Oral Daily  . pantoprazole (PROTONIX) IV  40 mg Intravenous Q24H  . potassium chloride SA  20 mEq Oral BID    Assessment/Plan: Patient showing more signs of cranial nerve involvement today.  To have MRI today.  ACTH, GH, and IGF-1 pending.  TSH, LH, FSH mildly decreased.  Prolactin normal.  Cortisol slightly elevated.    Recommendations: 1.  Will continue to follow up blood work 2.  Agree with MRI   LOS: 1 day   Alexis Goodell, MD [facility] [phone] 12/07/2013  1:09 PM

## 2013-12-07 NOTE — Progress Notes (Signed)
PULMONARY / CRITICAL CARE MEDICINE   Name: [name] [name] MRN: [id number] DOB: [birth year]    ADMISSION DATE:  12/06/2013  REFERRING MD :  Dr. Doy Mince PRIMARY SERVICE: PCCM   CHIEF COMPLAINT:  Hypercarbic Respiratory Failure   BRIEF PATIENT DESCRIPTION: 48 y/o M with a PMH of HTN, dCHF, OSA who presented to [facility] ER with 2 day hx of HA, blurry vision, & 1 episode of dark emesis (? If blood).  CT evaluation noted sellar & suprasellar mass lesion.  Developed hypercarbic respiratory failure in ER requiring bipap.  PCCM consulted for ICU admission.   SIGNIFICANT EVENTS: 7/04 - Admit with HA, blurry vision, vomiting (dark emesis, ? blood). CT positive for suprasellar mass 7/5 BIPAP overnight  STUDIES:  7/04 - CT head >> prominent, large sellar & suprasellar mass lesion with extension into the R cavernous sinus, ssuperior extension beyond the diaphragm sella. Osseous destruction with extension into sphenoid sinus.  LINES / TUBES: PIV  CULTURES:   ANTIBIOTICS: Diflucan (yeast RLQ wound) 7/4>>>    SUBJECTIVE:  BIPAP overnight, but breathing comfortably this AM, asking me questions about his brain mass  VITAL SIGNS: Temp:  [99 F (37.2 C)-99.2 F (37.3 C)] 99.2 F (37.3 C) (07/05 0100) Pulse Rate:  [55-105] 76 (07/05 0700) Resp:  [14-25] 16 (07/05 0700) BP: (125-181)/(79-143) 161/93 mmHg (07/05 0700) SpO2:  [90 %-100 %] 99 % (07/05 0700) FiO2 (%):  [100 %] 100 % (07/05 0202) Weight:  [189.604 kg (418 lb)-190.1 kg (419 lb 1.5 oz)] 190.1 kg (419 lb 1.5 oz) (07/05 0500) HEMODYNAMICS:   VENTILATOR SETTINGS: Vent Mode:  [-]  FiO2 (%):  [100 %] 100 % INTAKE / OUTPUT: Intake/Output     07/04 0701 - 07/05 0700 07/05 0701 - 07/06 0700   I.V. (mL/kg) 815 (4.3)    Total Intake(mL/kg) 815 (4.3)    Urine (mL/kg/hr) 9100    Total Output 9100     Net -8285            PHYSICAL EXAMINATION:  Gen: morbidly obese, comfortable on BIPAP HEENT: NCAT, BIPAP mask in place, ptosis PULM:  CTA B CV: RRR, no mgr, no JVD AB: BS+, soft, nontender, no hsm Ext: warm, + leg edema, no clubbing, no cyanosis Derm: R abdominal wound vac Neuro: Asleep but arouses easily, interactive, asking questions about his condition  LABS:  CBC  Recent Labs Lab 12/06/13 0855 12/07/13 0234  WBC 10.2 8.2  HGB 13.7 13.4  HCT 45.9 44.6  PLT 146* 144*   Coag's No results found for this basename: APTT, INR,  in the last 168 hours BMET  Recent Labs Lab 12/06/13 0855 12/07/13 0234  NA 138 141  K 3.9 4.3  CL 98 98  CO2 30 33*  BUN 19 14  CREATININE 0.75 0.81  GLUCOSE 148* 117*   Electrolytes  Recent Labs Lab 12/06/13 0855 12/07/13 0234  CALCIUM 8.4 8.8   Sepsis Markers  Recent Labs Lab 12/06/13 0855  LATICACIDVEN 0.8   ABG  Recent Labs Lab 12/06/13 0758 12/06/13 1012  PHART 7.218* 7.256*  PCO2ART 78.5* 76.9*  PO2ART 82.0 118.0*   Liver Enzymes  Recent Labs Lab 12/06/13 0855  AST 23  ALT 35  ALKPHOS 100  BILITOT 0.4  ALBUMIN 3.9   Cardiac Enzymes No results found for this basename: TROPONINI, PROBNP,  in the last 168 hours Glucose  Recent Labs Lab 12/06/13 2023  GLUCAP 124*    Imaging   ASSESSMENT / PLAN:  PULMONARY A: Acute  on Chronic Hypercarbic Respiratory Failure > due to OSA / OHS High Risk Respiratory Failure - in setting of morbid obesity, OSA/OHS + acute narcotic usage P:   BiPAP QHS and PRN Minimize sedating/resp suppressing meds (benzo/opiates) Monitor for mental status changes.   Pulmonary hygiene as able  NEUROLOGIC A:   Suprasellar Mass> TSH low, LH, FSH both low, ACTH pending Headache / Pain  Ptosis of R Eye  Depression / Anxiety Peripheral Neuropathy  P:   Per NSGY/Neurology MRI pending D/c ultram with fentanyl use Use NSAIDS for pain as able (toradol 7/5) Continue home wellbutrin, gabapentin See endocrine for w/u  CARDIOVASCULAR A:  Hypertension  Bradycardia  dCHF  P:  Continue home meds:  Lasix,  coreg ICU monitoring   RENAL A:   No acute issues  P:   Trend BMP Replace lytes as indicated   GASTROINTESTINAL A:   Morbid Obesity  Hematemesis - 1 episode, on NSAID at baseline.  No further bleeding  At Risk Aspiration - with hypercarbic resp failure P:   Clear liquid diet  PPI with NSAID use Hold diet meds  HEMATOLOGIC A:   Mild Thrombocytopenia  P:  DVT:  SQ Heparin   INFECTIOUS A:   RLQ Chronic Wound VAC Yeast RLQ  P:   Monitor fever curve / leukocytosis  IV diflucan x 3 days WOC consult for [facility] assessment & changes.  Changed at home M/W/F  ENDOCRINE A:  Sellar & Suprasellar Mass Hyperglycemia   P:   -needs endocrine f/u -SSI   I have personally obtained a history, examined the patient, evaluated laboratory and imaging results, formulated the assessment and plan and placed orders.  CRITICAL CARE: The patient is critically ill with multiple organ systems failure and requires high complexity decision making for assessment and support, frequent evaluation and titration of therapies, application of advanced monitoring technologies and extensive interpretation of multiple databases. Critical Care Time devoted to patient care services described in this note is 35 minutes.   Roselie Awkward, MD [place] [facility] Pager: [phone] Cell: [phone] If no response, call [phone]    12/07/2013, 9:23 AM

## 2013-12-07 NOTE — Consult Note (Signed)
WOC wound consult note Reason for Consult: Patient with chronic wound in RLQ. Negative Pressure Wound Therapy (NPWT), specifically the [employer] V.A.C. is the current POC with three timely weekly changes performed by [facility] from [facility]).  Additionally, patient is followed by the outpatient wound care center at [facility] (Dr. Lin Landsman per patient). We will maintain the M-W-F dressing change schedule and next change is scheduled for tomorrow, 12/08/13. Wound type:Non-healing surgical Pressure Ulcer POA: No Dressing procedure/placement/frequency: Today I discontinued the home V.A.C. ([place]) and implemented the acute care V.A.C. system with settings of 125 mmHg continuous negative pressure. An immediate seal was achieved in the therapeutic range. I removed and discarded the drainage cannister from the home unit and placed the unit back in its protective bag.  Patient is aware that his visitors (he is expecting his mother later today) should take the equipment home and will see to it that they do.  NPWT schedule will continue as M-W-F and the [facility] nursing team will change this dressing tomorrow and obtain initial wound measurements.  Thereafter, the bedside nursing staff will perform dressing changes.  Orders are provided today for continuation of this therapy. Central nursing team will see tomorrow for intial dressing change, but will not follow.  We will remain available to this patient, the nursing and medical teams.  Please re-consult if needed. Thanks, Maudie Flakes, MSN, RN, [place], [place], [place] [phone])

## 2013-12-07 NOTE — Progress Notes (Signed)
Pt. asked nurse to be taken off BiPAP. Pt. Is now on 4LPM nasal cannula.

## 2013-12-07 NOTE — Progress Notes (Signed)
Patient not ready to go on BIPAP at this time but states that he will be willing to try to wear it through the night tonight when he goes to sleep.  Patient will let RN know when he is ready for bed and RT will place patient on BIPAP.  RT will continue to monitor.

## 2013-12-07 NOTE — Progress Notes (Signed)
Placed pt. on BiPAP at this time because of irregular SATS due to pt sleeping. Place on BiPAP with settings of 20/6 and 100% FIO2 pt. is tolerating well at this time.

## 2013-12-07 NOTE — Progress Notes (Signed)
Patient ID: [name] [name], male   DOB: 1966-03-29, 48 y.o.   MRN: [id number] Neuro stable, right ptosis with enlarge pupil. No headache.for mri today. Spoke with his mother

## 2013-12-07 NOTE — Progress Notes (Signed)
Placed Pt. On BiPAP with Large FFM at this time. Pt is tolerating well with settings of 18/6 and FIO2 of 50% with 100% SAT.

## 2013-12-08 ENCOUNTER — Inpatient Hospital Stay (HOSPITAL_COMMUNITY): Payer: BC Managed Care – PPO

## 2013-12-08 LAB — CBC WITH DIFFERENTIAL/PLATELET
Basophils Absolute: 0 10*3/uL (ref 0.0–0.1)
Basophils Relative: 0 % (ref 0–1)
Eosinophils Absolute: 0 10*3/uL (ref 0.0–0.7)
Eosinophils Relative: 0 % (ref 0–5)
HCT: 42.8 % (ref 39.0–52.0)
Hemoglobin: 12.8 g/dL — ABNORMAL LOW (ref 13.0–17.0)
Lymphocytes Relative: 8 % — ABNORMAL LOW (ref 12–46)
Lymphs Abs: 0.6 10*3/uL — ABNORMAL LOW (ref 0.7–4.0)
MCH: 27.2 pg (ref 26.0–34.0)
MCHC: 29.9 g/dL — ABNORMAL LOW (ref 30.0–36.0)
MCV: 90.9 fL (ref 78.0–100.0)
Monocytes Absolute: 0.7 10*3/uL (ref 0.1–1.0)
Monocytes Relative: 8 % (ref 3–12)
Neutro Abs: 6.7 10*3/uL (ref 1.7–7.7)
Neutrophils Relative %: 84 % — ABNORMAL HIGH (ref 43–77)
Platelets: 131 10*3/uL — ABNORMAL LOW (ref 150–400)
RBC: 4.71 MIL/uL (ref 4.22–5.81)
RDW: 17.8 % — ABNORMAL HIGH (ref 11.5–15.5)
WBC: 8 10*3/uL (ref 4.0–10.5)

## 2013-12-08 LAB — BASIC METABOLIC PANEL
Anion gap: 6 (ref 5–15)
BUN: 17 mg/dL (ref 6–23)
CO2: 35 mEq/L — ABNORMAL HIGH (ref 19–32)
Calcium: 8.8 mg/dL (ref 8.4–10.5)
Chloride: 97 mEq/L (ref 96–112)
Creatinine, Ser: 0.87 mg/dL (ref 0.50–1.35)
GFR calc Af Amer: >90 mL/min (ref >90)
GFR calc non Af Amer: >90 mL/min (ref >90)
Glucose, Bld: 119 mg/dL — ABNORMAL HIGH (ref 70–99)
Potassium: 4.3 mEq/L (ref 3.7–5.3)
Sodium: 138 mEq/L (ref 137–147)

## 2013-12-08 LAB — INSULIN-LIKE GROWTH FACTOR: Somatomedin C: 121 ng/mL (ref 55–213)

## 2013-12-08 LAB — GROWTH HORMONE: Growth Hormone: 0.39 ng/mL (ref 0.00–3.00)

## 2013-12-08 MED ORDER — AMLODIPINE BESYLATE 5 MG PO TABS
5.0000 mg | ORAL_TABLET | Freq: Every day | ORAL | Status: DC
  Administered 2013-12-08 – 2013-12-10 (×3): 5 mg via ORAL
  Filled 2013-12-08 (×3): qty 1

## 2013-12-08 MED ORDER — SODIUM CHLORIDE 0.9 % IJ SOLN: 10.0000 mL | INTRAMUSCULAR | Status: DC | PRN

## 2013-12-08 MED ORDER — CLOTRIMAZOLE 1 % EX CREA
TOPICAL_CREAM | Freq: Two times a day (BID) | CUTANEOUS | Status: DC
  Filled 2013-12-08: qty 15

## 2013-12-08 MED ORDER — MUPIROCIN 2 % EX OINT
1.0000 "application " | TOPICAL_OINTMENT | Freq: Two times a day (BID) | CUTANEOUS | Status: AC
  Administered 2013-12-08 – 2013-12-13 (×5): 1 via NASAL
  Filled 2013-12-08: qty 22

## 2013-12-08 MED ORDER — BIOTENE DRY MOUTH MT LIQD
15.0000 mL | Freq: Two times a day (BID) | OROMUCOSAL | Status: DC
  Administered 2013-12-08 – 2013-12-10 (×4): 15 mL via OROMUCOSAL

## 2013-12-08 MED ORDER — LIDOCAINE HCL 4 % EX SOLN
Freq: Every day | CUTANEOUS | Status: DC | PRN
  Administered 2013-12-08: 5 mL via TOPICAL
  Filled 2013-12-08: qty 50

## 2013-12-08 MED ORDER — CHLORHEXIDINE GLUCONATE CLOTH 2 % EX PADS
6.0000 | MEDICATED_PAD | Freq: Every day | CUTANEOUS | Status: AC
  Administered 2013-12-09 – 2013-12-13 (×5): 6 via TOPICAL

## 2013-12-08 MED ORDER — SODIUM CHLORIDE 0.9 % IJ SOLN
10.0000 mL | Freq: Two times a day (BID) | INTRAMUSCULAR | Status: DC
  Administered 2013-12-09: 30 mL
  Administered 2013-12-09: 10 mL
  Administered 2013-12-10: 40 mL
  Administered 2013-12-10 – 2013-12-11 (×2): 10 mL
  Administered 2013-12-11: 30 mL
  Administered 2013-12-12 – 2013-12-13 (×3): 10 mL
  Administered 2013-12-14: 20 mL
  Administered 2013-12-14: 10 mL
  Administered 2013-12-15: 40 mL
  Administered 2013-12-16: 10 mL
  Administered 2013-12-16: 30 mL
  Administered 2013-12-17 – 2013-12-18 (×3): 10 mL
  Administered 2013-12-18: 30 mL
  Administered 2013-12-19: 10 mL
  Administered 2013-12-20 (×2): 30 mL

## 2013-12-08 MED ORDER — MUPIROCIN 2 % EX OINT: 1.0000 "application " | TOPICAL_OINTMENT | Freq: Two times a day (BID) | CUTANEOUS | Status: DC

## 2013-12-08 NOTE — Consult Note (Addendum)
WOC wound consult note Reason for Consult: NPWT VAC dressing change to the RLQ. Pt with history of infected surgical site and suture allergy issues.  Wound present RLQ for >6 mons.  Pt large with large pendulous abdomen and history of fungal overgrowth under the VAC drape.  Has a very specific routine established by [facility] and outpatient MD which includes treatment of his periwound skin with antifungal powder and using crusting technique to treat and prevent further fungal issues.  Also to assist with pain with dressing the HHRN injects the dressing with oral lidocaine prior to removal of the dressing.  I discussed this will CCM and they have order the lidocaine for my use today.  NPWT device turned off for 30 minutes prior to dressing change to aid in removal and lessen pain for patient. Injected the foam dressing with 5cc of 4% liquid lidocaine prior to beginning the dressing change. Lidocaine left at the bedside with the other dressing supplies.  Wound type: surgical wound, non healing  Pressure Ulcer POA: No Measurement:2.5cm x 6cm x 6.5cm with tunneling at 3 oclock that is 8cm  Wound AJH:HIDU, clean (what I can see), minimal granulation tissue Drainage (amount, consistency, odor) minimal in VAC canister, strong yeast like odor Periwound:scattered satellite lesions consistent with candida.  Pt on oral Diflucan per his report.  Some skin denudation over the abdomen consistent with contact dermatitis and candida Dressing procedure/placement/frequency: After waiting 30 mins to allow for lidocaine to saturate foam and wound base removed drape and cleansed the periwound well and dried. Cleansed wound bed with normal saline.  Measurements obtained, 1pc of black granufoam used to fill the tunnel and the remainder of the wound bed, cut shape to make sure one continuous pc used. Applied antifungal powder to the periwound skin and then used no sting skin prep wipes to "crust" the skin.  Window framed the  periwound with VAC drape and used one additional strip of VAC drape to bridge wound away from the site.  Pt reports they have tried without bridging and it seems to cause the periwound skin to worsen. Seal at 119mmHG, pt tolerated well. Received IV pain meds prior to dressing change as well.    WOC will follow along with you for [facility] dressing changes M/W/F.    Kavish Lafitte Liane Comber RN,CWOCN [phone]  [phone] DC the lotrium cream, using antifungal powder around abdominal wound instead for "crusting" technique with skin prep M. Gertude Benito RN,CWOCN

## 2013-12-08 NOTE — Progress Notes (Signed)
Subjective: Patient remains awake and alert.  Biggest complaint is that of nausea.    Objective: Current vital signs: BP 137/76  Pulse 64  Temp(Src) 97.7 F (36.5 C) (Oral)  Resp 17  Ht 5\' 11"  (1.803 m)  Wt 215.005 kg (474 lb)  BMI 66.14 kg/m2  SpO2 97% Vital signs in last 24 hours: Temp:  [97.7 F (36.5 C)-98.4 F (36.9 C)] 97.7 F (36.5 C) (07/06 0800) Pulse Rate:  [56-89] 64 (07/06 1100) Resp:  [11-25] 17 (07/06 1100) BP: (101-179)/(74-119) 137/76 mmHg (07/06 1100) SpO2:  [85 %-100 %] 97 % (07/06 1100) FiO2 (%):  [50 %] 50 % (07/05 2209) Weight:  [215.005 kg (474 lb)] 215.005 kg (474 lb) (07/06 0500)  Intake/Output from previous day: 07/05 0701 - 07/06 0700 In: 450 [P.O.:200; I.V.:250] Out: 1500 [Urine:1500] Intake/Output this shift: Total I/O In: 300 [P.O.:300] Out: 1000 [Urine:1000] Nutritional status: Clear Liquid  Neurologic Exam: Awake and alert. Oriented. Follows commands. Speech fluent without evidence of aphasia.  Cranial Nerves:  II: Discs flat bilaterally; Visual fields grossly normal, left pupil 98mm and reactive, right pupil 53mm and unreactive  III,IV, VI: right ptosis, Neither eye goes beyond midline with left lateral gaze.  Limited right lateral with right eye.  Left eye goes in and down with right lateral gaze.  No upward or downward movement of the right eye.  Upward and downward movement intact in the left eye.    V,VII: decrease in left NLF, facial light touch sensation normal bilaterally  VIII: hearing normal bilaterally  IX,X: gag reflex present  XI: bilateral shoulder shrug  XII: rightward tongue extension  Motor:  5/5 throughout Tone and bulk:normal tone throughout; no atrophy noted  Sensory: Pinprick and light touch decreased in the left upper and lower extremity  Deep Tendon Reflexes: 2+ in the upper extremities and absent in the lower extremities  Plantars:  Right: mute   Left: mute   Lab Results: Basic Metabolic Panel:  Recent  Labs Lab 12/06/13 0855 12/07/13 0234 12/08/13 0310  NA 138 141 138  K 3.9 4.3 4.3  CL 98 98 97  CO2 30 33* 35*  GLUCOSE 148* 117* 119*  BUN 19 14 17   CREATININE 0.75 0.81 0.87  CALCIUM 8.4 8.8 8.8    Liver Function Tests:  Recent Labs Lab 12/06/13 0855  AST 23  ALT 35  ALKPHOS 100  BILITOT 0.4  PROT 7.1  ALBUMIN 3.9   No results found for this basename: LIPASE, AMYLASE,  in the last 168 hours No results found for this basename: AMMONIA,  in the last 168 hours  CBC:  Recent Labs Lab 12/06/13 0855 12/07/13 0234 12/08/13 0310  WBC 10.2 8.2 8.0  NEUTROABS 9.0*  --  6.7  HGB 13.7 13.4 12.8*  HCT 45.9 44.6 42.8  MCV 90.0 91.4 90.9  PLT 146* 144* 131*    Cardiac Enzymes: No results found for this basename: CKTOTAL, CKMB, CKMBINDEX, TROPONINI,  in the last 168 hours  Lipid Panel: No results found for this basename: CHOL, TRIG, HDL, CHOLHDL, VLDL, LDLCALC,  in the last 168 hours  CBG:  Recent Labs Lab 12/06/13 2023  GLUCAP 124*    Microbiology: Results for orders placed during the hospital encounter of 12/06/13  MRSA PCR SCREENING     Status: Abnormal   Collection Time    12/06/13  5:15 PM      Result Value Ref Range Status   MRSA by PCR POSITIVE (*) NEGATIVE Final  Comment:            The GeneXpert MRSA Assay (FDA     approved for NASAL specimens     only), is one component of a     comprehensive MRSA colonization     surveillance program. It is not     intended to diagnose MRSA     infection nor to guide or     monitor treatment for     MRSA infections.     RESULT CALLED TO, READ BACK BY AND VERIFIED WITH:     SMITH,A RN 2036 12/06/13 MITCHELL,L    Coagulation Studies: No results found for this basename: LABPROT, INR,  in the last 72 hours  Imaging: [name]  12/07/2013   CLINICAL DATA:  pituitary tumor  EXAM: MRI HEAD WITHOUT AND WITH CONTRAST  TECHNIQUE: Multiplanar, multiecho pulse sequences of the brain and surrounding  structures were obtained without and with intravenous contrast.  CONTRAST:  88mL MULTIHANCE GADOBENATE DIMEGLUMINE 529 MG/ML IV SOLN  COMPARISON:  Head CT 12/06/2013  FINDINGS: The study is degraded by considerable motion. Patient's body habitus prevents use of the best coil configuration.  Diffusion imaging does not show any acute or subacute infarction. There are mild chronic appearing small vessel changes of the cerebral hemispheric white matter. No cortical insult. No intra-axial mass, hemorrhage, hydrocephalus or extra-axial fluid collection.  There is an invasive mass involving the sella, clivus, sphenoid sinus and suprasellar region. This measures 3.7 cm cephalocaudal, 4.1 cm front to back, and 3.4 cm right to left. There is invasion of the cavernous sinus on the right, surrounding the carotid artery. Early invasion on the left is possible. The component extending into the suprasellar region measures 14 mm in diameter and indents the undersurface of the optic chiasm. The lesion shows variable enhancement. I do not see cystic change or evidence of calcification by MR.  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 Differential diagnosis does include metastatic disease, chordoma and craniopharyngioma.  IMPRESSION: 3.7 x 4.1 x 3.4 cm mass with the epicenter in the region of the sella/clivus. There is invasion of the bony clivus, invasion of the sphenoid sinus and extension into the suprasellar region. There is definite cavernous sinus invasion on the right and possible or early cavernous sinus invasion on the left. 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 The differential diagnosis does include metastatic disease, chordoma and craniopharyngioma.   Electronically Signed   By: Nelson Chimes M.D.   On: 12/07/2013 16:10   Dg Chest Port 1 View  12/07/2013   CLINICAL DATA:  Assess airspace disease  EXAM: PORTABLE CHEST - 1 VIEW  COMPARISON:  Portable chest x-ray of December 06, 2013  FINDINGS: The patient remains rotated. The lungs are reasonably well inflated. The interstitial markings are less conspicuous today. The pulmonary vascularity remains engorged. The cardiopericardial silhouette remains enlarged. No definite pleural effusion is demonstrated. There is persistent elevation of the right hemidiaphragm.  IMPRESSION: There has been slight interval improvement in the appearance of the lungs consistent with resolving interstitial edema.   Electronically Signed   By: David  Martinique   On: 12/07/2013 07:24    Medications:  I have reviewed the patient's current medications. Scheduled: . amLODipine  5 mg Oral Daily  . buPROPion  300 mg Oral Daily  . carvedilol  12.5 mg Oral BID WC  . fluconazole (DIFLUCAN) IV  100 mg Intravenous Q24H  . furosemide  40  mg Oral Daily  . gabapentin  900 mg Oral QHS  . heparin  5,000 Units Subcutaneous 3 times per day  . multivitamin with minerals  1 tablet Oral Daily  . pantoprazole (PROTONIX) IV  40 mg Intravenous Q24H  . potassium chloride SA  20 mEq Oral BID    Assessment/Plan: Patient with further left cranial nerve involvement today compared to yesterday.  Neurosurgery aware.  ACTH, GH and IGF-1 remain pending.  MRI reviewed and shows extensive nature of mass lesion.  Differential unchanged.    Recommendations: 1.  Will continue to follow with you   LOS: 2 days   Alexis Goodell, MD [facility] [phone] 12/08/2013  11:38 AM

## 2013-12-08 NOTE — Progress Notes (Signed)
Patient ID: [name] [name], male   DOB: [birth year], 48 y.o.   MRN: [id number] Mri seen. Large lesion eroding the clivus, sphenoid wall plus extension into the suprasellar area. Endocrine w/u in progress. Ptosis in the right with enlarged pupil. He has an active, infected wound in the abdominal wall. [name] [name] will consult the general surgeon for advise as well as ENT. [name] [name]  Just want to be sure that if we go ahead with surgery [name] [name] will not spread the infection to the base of the skull.

## 2013-12-08 NOTE — Progress Notes (Signed)
Patient ID: [name] [name], male   DOB: [birth year], 48 y.o.   MRN: [id number] Long talk with his mother. His abdominal wall infection has been going on for at least 2 years , being treated in [place]. The mother requested the surgical consult to be done by dr Zella Richer who has been taken care of his family. Also will get dr Lucia Gaskins and ID

## 2013-12-08 NOTE — Progress Notes (Signed)
UR completed.  Amarianna Abplanalp, RN BSN [facility] CCM Trauma/Neuro ICU Case Manager [phone]  

## 2013-12-08 NOTE — Consult Note (Addendum)
    [place] for Infectious Disease   I was consulted by Dr. 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  The chief concern of Dr. Joya Salm as he has written in the chart is to ensure control of the infection in the abdomen and to make sure that failure to do so does not increase risk of surgical site infection when he undertakes Neurosurgery to biopsy the patients brain mass.   After entering the room and engaging the patient and his wife in conversation, I told them that I was confident that the patient's local infection should be something that we can control and that as long as the infection itself was anatomically remote from the Neurosurgical intervention and the patient was not bacteremic there would next to no additional surgical risk above that found in a similar patient with known MRSA colonization.  The patient apparently did not feel that I was taking him seriously enough and said he was "deathly afraid of this MRSA infection"  I then reminded  him that he was likely also concerned about the mass in his brain.  The wife then stated that they wanted another doctor.  I told her I was happy to remove myself but they would have to wait some time for a different ID consultant   I left the room and heard her call me "an asshole" as I left.  I am happy to return tomorrow, IF the patient and the family are agreeable to being seen by me and they can be less belligerent towards me.  If I said something insensitive I am also happy to apologize but I felt that the patient and his wife in particular were MORE than uncivil and I do not think it likely for there to be a meaningful therapeutic relationship absent some civility.  My recommendation is to make sure that General Surgery feels confident about the condition of the wound and that it does not need additional debridement or intervention.  If there is still active infection  locally at the wound this can be managed with IV vancomycin dosed by pharmacy  With re to surgical risks beyond control o fthe infection  the keys will be to ensure that the patient   a) has preoperative IV vancomycin  B) he has daily hibiclens baths  C) twice daily IN mupirocin  Please let me know tomorrow if the patient and family wish for me to consult here.   I will NOT charge for my time for this pt encounter

## 2013-12-08 NOTE — Consult Note (Signed)
Reason for Consult:pituitary tumor Referring Physician: [name] is an 48 y.o. male.  HPI: Patient recently moved back to [place] from [place] to be closer to family for medical care. 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 He's obese with questionable OSA. He recently underwent a MRI and CT scan that demonstrated a destructive mass in the region of the pituitary gland. I was consulted to assist in the surgical approach.    Past Medical History  Diagnosis Date  . Sleep apnea   . Allergic rhinitis   . Diastolic CHF   . Gout   . Nephrolithiasis   . Peripheral neuropathy   . Hypertension     Past Surgical History  Procedure Laterality Date  . Sigmoid colonoscopy    . Hernia repair    . Abdominal surgery      for cyst with MRSA    Social History:  reports that he has never smoked. He has never used smokeless tobacco. He reports that he does not drink alcohol or use illicit drugs.  Allergies:  Allergies  Allergen Reactions  . Penicillins Anaphylaxis  . Morphine And Related Hives    Medications: I have reviewed the patient's current medications.  Results for orders placed during the hospital encounter of 12/06/13 (from the past 48 hour(s))  GLUCOSE, CAPILLARY     Status: Abnormal   Collection Time    12/06/13  8:23 PM      Result Value Ref Range   Glucose-Capillary 124 (*) 70 - 99 mg/dL  CBC     Status: Abnormal   Collection Time    12/07/13  2:34 AM      Result Value Ref Range   WBC 8.2  4.0 - 10.5 K/uL   RBC 4.88  4.22 - 5.81 MIL/uL   Hemoglobin 13.4  13.0 - 17.0 g/dL   HCT 44.6  39.0 - 52.0 %   MCV 91.4  78.0 - 100.0 fL   MCH 27.5  26.0 - 34.0 pg   MCHC 30.0  30.0 - 36.0 g/dL   RDW 18.2 (*) 11.5 - 15.5 %   Platelets 144 (*) 150 - 400 K/uL  BASIC METABOLIC PANEL     Status: Abnormal   Collection Time    12/07/13  2:34 AM      Result Value Ref Range   Sodium 141  137 - 147 mEq/L   Potassium 4.3  3.7 - 5.3 mEq/L    Chloride 98  96 - 112 mEq/L   CO2 33 (*) 19 - 32 mEq/L   Glucose, Bld 117 (*) 70 - 99 mg/dL   BUN 14  6 - 23 mg/dL   Creatinine, Ser 0.81  0.50 - 1.35 mg/dL   Calcium 8.8  8.4 - 10.5 mg/dL   GFR calc non Af Amer >90  >90 mL/min   GFR calc Af Amer >90  >90 mL/min   Comment: (NOTE)     The eGFR has been calculated using the CKD EPI equation.     This calculation has not been validated in all clinical situations.     eGFR's persistently <90 mL/min signify possible Chronic Kidney     Disease.   Anion gap 10  5 - 15  BASIC METABOLIC PANEL     Status: Abnormal   Collection Time    12/08/13  3:10 AM      Result Value Ref Range   Sodium 138  137 - 147 mEq/L  Potassium 4.3  3.7 - 5.3 mEq/L   Chloride 97  96 - 112 mEq/L   CO2 35 (*) 19 - 32 mEq/L   Glucose, Bld 119 (*) 70 - 99 mg/dL   BUN 17  6 - 23 mg/dL   Creatinine, Ser 0.87  0.50 - 1.35 mg/dL   Calcium 8.8  8.4 - 10.5 mg/dL   GFR calc non Af Amer >90  >90 mL/min   GFR calc Af Amer >90  >90 mL/min   Comment: (NOTE)     The eGFR has been calculated using the CKD EPI equation.     This calculation has not been validated in all clinical situations.     eGFR's persistently <90 mL/min signify possible Chronic Kidney     Disease.   Anion gap 6  5 - 15  CBC WITH DIFFERENTIAL     Status: Abnormal   Collection Time    12/08/13  3:10 AM      Result Value Ref Range   WBC 8.0  4.0 - 10.5 K/uL   RBC 4.71  4.22 - 5.81 MIL/uL   Hemoglobin 12.8 (*) 13.0 - 17.0 g/dL   HCT 42.8  39.0 - 52.0 %   MCV 90.9  78.0 - 100.0 fL   MCH 27.2  26.0 - 34.0 pg   MCHC 29.9 (*) 30.0 - 36.0 g/dL   RDW 17.8 (*) 11.5 - 15.5 %   Platelets 131 (*) 150 - 400 K/uL   Neutrophils Relative % 84 (*) 43 - 77 %   Neutro Abs 6.7  1.7 - 7.7 K/uL   Lymphocytes Relative 8 (*) 12 - 46 %   Lymphs Abs 0.6 (*) 0.7 - 4.0 K/uL   Monocytes Relative 8  3 - 12 %   Monocytes Absolute 0.7  0.1 - 1.0 K/uL   Eosinophils Relative 0  0 - 5 %   Eosinophils Absolute 0.0  0.0 - 0.7 K/uL    Basophils Relative 0  0 - 1 %   Basophils Absolute 0.0  0.0 - 0.1 K/uL    Mr [name] [name] Wo Contrast  12/07/2013   CLINICAL DATA:  pituitary tumor  EXAM: MRI HEAD WITHOUT AND WITH CONTRAST  TECHNIQUE: Multiplanar, multiecho pulse sequences of the brain and surrounding structures were obtained without and with intravenous contrast.  CONTRAST:  6m MULTIHANCE GADOBENATE DIMEGLUMINE 529 MG/ML IV SOLN  COMPARISON:  Head CT 12/06/2013  FINDINGS: The study is degraded by considerable motion. Patient's body habitus prevents use of the best coil configuration.  Diffusion imaging does not show any acute or subacute infarction. There are mild chronic appearing small vessel changes of the cerebral hemispheric white matter. No cortical insult. No intra-axial mass, hemorrhage, hydrocephalus or extra-axial fluid collection.  There is an invasive mass involving the sella, clivus, sphenoid sinus and suprasellar region. This measures 3.7 cm cephalocaudal, 4.1 cm front to back, and 3.4 cm right to left. There is invasion of the cavernous sinus on the right, surrounding the carotid artery. Early invasion on the left is possible. The component extending into the suprasellar region measures 14 mm in diameter and indents the undersurface of the optic chiasm. The lesion shows variable enhancement. I do not see cystic change or evidence of calcification by MR.  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 Differential diagnosis does include metastatic disease, chordoma and craniopharyngioma.  IMPRESSION: 3.7 x 4.1 x 3.4 cm mass with the epicenter in the region of the sella/clivus. There  is invasion of the bony clivus, invasion of the sphenoid sinus and extension into the suprasellar region. There is definite cavernous sinus invasion on the right and possible or early cavernous sinus invasion on the left. 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 The differential diagnosis does include metastatic  disease, chordoma and craniopharyngioma.   Electronically Signed   By: Nelson Chimes M.D.   On: 12/07/2013 16:10   Dg Chest Port 1 View  12/07/2013   CLINICAL DATA:  Assess airspace disease  EXAM: PORTABLE CHEST - 1 VIEW  COMPARISON:  Portable chest x-ray of December 06, 2013  FINDINGS: The patient remains rotated. The lungs are reasonably well inflated. The interstitial markings are less conspicuous today. The pulmonary vascularity remains engorged. The cardiopericardial silhouette remains enlarged. No definite pleural effusion is demonstrated. There is persistent elevation of the right hemidiaphragm.  IMPRESSION: There has been slight interval improvement in the appearance of the lungs consistent with resolving interstitial edema.   Electronically Signed   By: David  Martinique   On: 12/07/2013 07:24    ROS:he's had some vision problems. No nasal obstruction. He's noted some bloody nasal discharge after using flonase but no gross bleeding   PE: Obese, awake and alert. Appropriately responsive. No respiratory difficulty Right eye swollen with dilated pupil. Pupils not equal. Nasal passages clear . No history of nasal surgery. Oral exam clear Neck obese without mass or swelling  Assessment/Plan: Destructive pituitary mass History of MRSA infection OSA  [name] [name] 12/08/2013, 7:14 PM

## 2013-12-08 NOTE — Progress Notes (Signed)
PULMONARY / CRITICAL CARE MEDICINE   Name: [name] [name] MRN: [id number] DOB: [birth year]    ADMISSION DATE:  12/06/2013  REFERRING MD :  Dr. Doy Mince PRIMARY SERVICE: PCCM   CHIEF COMPLAINT:  Hypercarbic Respiratory Failure   BRIEF PATIENT DESCRIPTION: 48 y/o M with a PMH of HTN, dCHF, OSA who presented to [facility] ER with 2 day hx of HA, blurry vision, & 1 episode of dark emesis (? If blood).  CT evaluation noted sellar & suprasellar mass lesion.  Developed hypercarbic respiratory failure in ER requiring bipap.  PCCM consulted for ICU admission.   SIGNIFICANT EVENTS: 7/04 - Admit with HA, blurry vision, vomiting (dark emesis, ? blood). CT positive for suprasellar mass 7/5 BIPAP overnight  STUDIES:  7/04 - CT head >> prominent, large sellar & suprasellar mass lesion with extension into the R cavernous sinus, ssuperior extension beyond the diaphragm sella. Osseous destruction with extension into sphenoid sinus.  LINES / TUBES: PIV  CULTURES: None  ANTIBIOTICS: Diflucan (yeast RLQ wound) 7/4>>>    SUBJECTIVE:  BIPAP overnight, but breathing comfortably this AM, asking me questions about his brain mass  VITAL SIGNS: Temp:  [97.8 F (36.6 C)-98.4 F (36.9 C)] 98 F (36.7 C) (07/06 0350) Pulse Rate:  [60-89] 68 (07/06 0700) Resp:  [12-25] 19 (07/06 0300) BP: (101-179)/(74-119) 155/119 mmHg (07/06 0700) SpO2:  [85 %-100 %] 100 % (07/06 0700) FiO2 (%):  [50 %] 50 % (07/05 2209) Weight:  [474 lb (215.005 kg)] 474 lb (215.005 kg) (07/06 0500) HEMODYNAMICS:   VENTILATOR SETTINGS: Vent Mode:  [-]  FiO2 (%):  [50 %] 50 % INTAKE / OUTPUT: Intake/Output     07/05 0701 - 07/06 0700 07/06 0701 - 07/07 0700   P.O. 200    I.V. (mL/kg) 250 (1.2)    Total Intake(mL/kg) 450 (2.1)    Urine (mL/kg/hr) 1500 (0.3)    Total Output 1500     Net -1050            PHYSICAL EXAMINATION:  Gen: morbidly obese, comfortable on BIPAP HEENT: NCAT, BIPAP mask in place, ptosis PULM: CTA B CV:  RRR, no mgr, no JVD AB: BS+, soft, nontender, no hsm Ext: warm, + leg edema, no clubbing, no cyanosis Derm: R abdominal wound vac Neuro: Asleep but arouses easily, interactive, asking questions about his condition  LABS:  CBC  Recent Labs Lab 12/06/13 0855 12/07/13 0234 12/08/13 0310  WBC 10.2 8.2 8.0  HGB 13.7 13.4 12.8*  HCT 45.9 44.6 42.8  PLT 146* 144* 131*   Coag's No results found for this basename: APTT, INR,  in the last 168 hours BMET  Recent Labs Lab 12/06/13 0855 12/07/13 0234 12/08/13 0310  NA 138 141 138  K 3.9 4.3 4.3  CL 98 98 97  CO2 30 33* 35*  BUN 19 14 17   CREATININE 0.75 0.81 0.87  GLUCOSE 148* 117* 119*   Electrolytes  Recent Labs Lab 12/06/13 0855 12/07/13 0234 12/08/13 0310  CALCIUM 8.4 8.8 8.8   Sepsis Markers  Recent Labs Lab 12/06/13 0855  LATICACIDVEN 0.8   ABG  Recent Labs Lab 12/06/13 0758 12/06/13 1012  PHART 7.218* 7.256*  PCO2ART 78.5* 76.9*  PO2ART 82.0 118.0*   Liver Enzymes  Recent Labs Lab 12/06/13 0855  AST 23  ALT 35  ALKPHOS 100  BILITOT 0.4  ALBUMIN 3.9   Cardiac Enzymes No results found for this basename: TROPONINI, PROBNP,  in the last 168 hours Glucose  Recent Labs Lab 12/06/13  2023  GLUCAP 124*    Imaging   ASSESSMENT / PLAN:  PULMONARY A: Acute on Chronic Hypercarbic Respiratory Failure > due to OSA / OHS High Risk Respiratory Failure - in setting of morbid obesity, OSA/OHS + acute narcotic usage P:   - BiPAP QHS and PRN but patient having difficulty with tolerance of BiPAP. - Minimize sedating/resp suppressing meds (benzo/opiates). - Monitor for mental status changes.  - Pulmonary hygiene as able. - Will likely be intubated post-op for sellar mass.  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 -  D/c ultram with fentanyl use. - Use NSAIDS for pain as able (toradol 7/5). - Continue home wellbutrin, gabapentin. - See endocrine for w/u.  CARDIOVASCULAR A:  Hypertension  Bradycardia  dCHF  P:  - Continue home meds:  Lasix, coreg. - Add norvasc 5 mg PO daily with holding parameters. - ICU monitoring.  RENAL A:   No acute issues  P:   - Trend BMP. - Replace lytes as indicated.  GASTROINTESTINAL A:   Morbid Obesity  Hematemesis - 1 episode, on NSAID at baseline.  No further bleeding  At Risk Aspiration - with hypercarbic resp failure P:   - Continue clear liquid diet. - PPI with NSAID use. - Hold diet meds.  HEMATOLOGIC A:   Mild Thrombocytopenia  P:  - DVT:  SQ Heparin.  INFECTIOUS A:   RLQ Chronic Wound VAC Yeast RLQ  P:   - Monitor fever curve / leukocytosis  - IV diflucan x 3 days then change to PO. - Add topical anti-fungal. - WOC following for [facility] assessment & changes.  Changed at home M/W/F  ENDOCRINE A:  Sellar & Suprasellar Mass Hyperglycemia   P:   -needs endocrine f/u -SSI  I have personally obtained a history, examined the patient, evaluated laboratory and imaging results, formulated the assessment and plan and placed orders.  CRITICAL CARE: The patient is critically ill with multiple organ systems failure and requires high complexity decision making for assessment and support, frequent evaluation and titration of therapies, application of advanced monitoring technologies and extensive interpretation of multiple databases. Critical Care Time devoted to patient care services described in this note is 35 minutes.   Rush Farmer, M.D. [facility] Pulmonary/Critical Care Medicine. Pager: [phone]. After hours pager: [phone].  12/08/2013, 9:01 AM

## 2013-12-08 NOTE — Progress Notes (Signed)
Peripherally Inserted Central Catheter/Midline Placement  The IV Nurse has discussed with the patient and/or persons authorized to consent for the patient, the purpose of this procedure and the potential benefits and risks involved with this procedure.  The benefits include less needle sticks, lab draws from the catheter and patient may be discharged home with the catheter.  Risks include, but not limited to, infection, bleeding, blood clot (thrombus formation), and puncture of an artery; nerve damage and irregular heat beat.  Alternatives to this procedure were also discussed.  PICC/Midline Placement Documentation  PICC Triple Lumen 12/08/13 PICC Right Cephalic 48 cm 0 cm (Active)       Aldona Lento L 12/08/2013, 9:00 PM

## 2013-12-09 ENCOUNTER — Inpatient Hospital Stay (HOSPITAL_COMMUNITY): Payer: BC Managed Care – PPO

## 2013-12-09 ENCOUNTER — Encounter (HOSPITAL_COMMUNITY): Payer: Self-pay | Admitting: General Surgery

## 2013-12-09 DIAGNOSIS — S31109A Unspecified open wound of abdominal wall, unspecified quadrant without penetration into peritoneal cavity, initial encounter: Secondary | ICD-10-CM

## 2013-12-09 DIAGNOSIS — Y849 Medical procedure, unspecified as the cause of abnormal reaction of the patient, or of later complication, without mention of misadventure at the time of the procedure: Secondary | ICD-10-CM

## 2013-12-09 DIAGNOSIS — T8189XA Other complications of procedures, not elsewhere classified, initial encounter: Secondary | ICD-10-CM

## 2013-12-09 LAB — BASIC METABOLIC PANEL
Anion gap: 6 (ref 5–15)
BUN: 15 mg/dL (ref 6–23)
CO2: 39 mEq/L — ABNORMAL HIGH (ref 19–32)
Calcium: 9 mg/dL (ref 8.4–10.5)
Chloride: 95 mEq/L — ABNORMAL LOW (ref 96–112)
Creatinine, Ser: 0.82 mg/dL (ref 0.50–1.35)
GFR calc Af Amer: >90 mL/min (ref >90)
GFR calc non Af Amer: >90 mL/min (ref >90)
Glucose, Bld: 92 mg/dL (ref 70–99)
Potassium: 4.4 mEq/L (ref 3.7–5.3)
Sodium: 140 mEq/L (ref 137–147)

## 2013-12-09 LAB — CBC
HCT: 42.6 % (ref 39.0–52.0)
Hemoglobin: 12.7 g/dL — ABNORMAL LOW (ref 13.0–17.0)
MCH: 26.8 pg (ref 26.0–34.0)
MCHC: 29.8 g/dL — ABNORMAL LOW (ref 30.0–36.0)
MCV: 89.9 fL (ref 78.0–100.0)
Platelets: 120 10*3/uL — ABNORMAL LOW (ref 150–400)
RBC: 4.74 MIL/uL (ref 4.22–5.81)
RDW: 17.2 % — ABNORMAL HIGH (ref 11.5–15.5)
WBC: 7 10*3/uL (ref 4.0–10.5)

## 2013-12-09 LAB — MAGNESIUM: Magnesium: 2.1 mg/dL (ref 1.5–2.5)

## 2013-12-09 LAB — PHOSPHORUS: Phosphorus: 2 mg/dL — ABNORMAL LOW (ref 2.3–4.6)

## 2013-12-09 MED ORDER — FLUCONAZOLE 100 MG PO TABS: 100.0000 mg | ORAL_TABLET | Freq: Every day | ORAL | Status: DC

## 2013-12-09 MED ORDER — PRO-STAT SUGAR FREE PO LIQD
30.0000 mL | Freq: Three times a day (TID) | ORAL | Status: DC
  Administered 2013-12-09 – 2013-12-14 (×10): 30 mL via ORAL
  Administered 2013-12-15 – 2013-12-16 (×5): via ORAL
  Administered 2013-12-17 – 2013-12-18 (×5): 30 mL via ORAL
  Administered 2013-12-19 (×2): via ORAL
  Administered 2013-12-20 (×2): 30 mL via ORAL
  Filled 2013-12-09 (×35): qty 30

## 2013-12-09 MED ORDER — GI COCKTAIL ~~LOC~~
30.0000 mL | Freq: Once | ORAL | Status: AC
  Administered 2013-12-09: 30 mL via ORAL
  Filled 2013-12-09: qty 30

## 2013-12-09 MED ORDER — VANCOMYCIN HCL 10 G IV SOLR
1500.0000 mg | INTRAVENOUS | Status: AC
  Administered 2013-12-10: 1500 mg via INTRAVENOUS
  Filled 2013-12-09: qty 1500

## 2013-12-09 MED ORDER — JUVEN PO PACK
1.0000 | PACK | Freq: Two times a day (BID) | ORAL | Status: DC
  Administered 2013-12-12 – 2013-12-20 (×18): 1 via ORAL
  Filled 2013-12-09 (×25): qty 1

## 2013-12-09 MED ORDER — FLUCONAZOLE 100 MG PO TABS
100.0000 mg | ORAL_TABLET | Freq: Every day | ORAL | Status: DC
  Administered 2013-12-09 – 2013-12-20 (×11): 100 mg via ORAL
  Filled 2013-12-09 (×12): qty 1

## 2013-12-09 MED ORDER — VANCOMYCIN HCL 10 G IV SOLR
1500.0000 mg | Freq: Once | INTRAVENOUS | Status: AC
  Administered 2013-12-10: 1500 mg via INTRAVENOUS
  Filled 2013-12-09: qty 1500

## 2013-12-09 MED ORDER — SODIUM CHLORIDE 0.9 % IV SOLN
2500.0000 mg | Freq: Once | INTRAVENOUS | Status: AC
  Administered 2013-12-09: 2500 mg via INTRAVENOUS
  Filled 2013-12-09: qty 2500

## 2013-12-09 MED ORDER — ALPRAZOLAM 0.25 MG PO TABS
0.2500 mg | ORAL_TABLET | Freq: Once | ORAL | Status: AC
  Administered 2013-12-09: 0.25 mg via ORAL
  Filled 2013-12-09: qty 1

## 2013-12-09 NOTE — Progress Notes (Signed)
[place] Progress Note Patient Name: [name] [name] DOB: [birth year] MRN: [id number]  Date of Service  12/09/2013   HPI/Events of Note  1. Anxiety -pre procedure 2. Gaseous abd pain and distension  eICU Interventions  1. Xanax x 1 2. Gi cocktail   Intervention Category Minor Interventions: Routine modifications to care plan (e.g. PRN medications for pain, fever);Clinical assessment - ordering diagnostic tests  Jamarco Zaldivar 12/09/2013, 7:58 PM

## 2013-12-09 NOTE — Progress Notes (Signed)
Per pt, HCPOA completed at bedside with witness and a Chief Executive Officer. Pt stated his mother would be POA. RN requested copy of papers when made available to family.

## 2013-12-09 NOTE — Progress Notes (Signed)
ANTIBIOTIC CONSULT NOTE - INITIAL  Pharmacy Consult for Vancomycin Indication: Abdominal wound, Surgical prophylaxis  Allergies  Allergen Reactions  . Penicillins Anaphylaxis and Other (See Comments)    Unknown reaction  . Morphine And Related Hives and Itching    Patient Measurements: Height: 5\' 11"  (180.3 cm) Weight: 466 lb (211.376 kg) IBW/kg (Calculated) : 75.3 Adjusted Body Weight:   Vital Signs: Temp: 98.2 F (36.8 C) (07/07 1156) Temp src: Oral (07/07 1156) BP: 144/95 mmHg (07/07 1300) Pulse Rate: 73 (07/07 1300) Intake/Output from previous day: 07/06 0701 - 07/07 0700 In: 520 [P.O.:470; IV Piggyback:50] Out: 8299 [Urine:3225; Drains:150] Intake/Output from this shift: Total I/O In: 390 [P.O.:390] Out: 850 [Urine:850]  Labs:  Recent Labs  12/07/13 0234 12/08/13 0310 12/09/13 0700  WBC 8.2 8.0 7.0  HGB 13.4 12.8* 12.7*  PLT 144* 131* 120*  CREATININE 0.81 0.87 0.82   Estimated Creatinine Clearance: 199.9 ml/min (by C-G formula based on Cr of 0.82). No results found for this basename: VANCOTROUGH, Corlis Leak, VANCORANDOM, [place], GENTPEAK, GENTRANDOM, TOBRATROUGH, TOBRAPEAK, TOBRARND, AMIKACINPEAK, AMIKACINTROU, AMIKACIN,  in the last 72 hours   Microbiology: Recent Results (from the past 720 hour(s))  MRSA PCR SCREENING     Status: Abnormal   Collection Time    12/06/13  5:15 PM      Result Value Ref Range Status   MRSA by PCR POSITIVE (*) NEGATIVE Final   Comment:            The GeneXpert MRSA Assay (FDA     approved for NASAL specimens     only), is one component of a     comprehensive MRSA colonization     surveillance program. It is not     intended to diagnose MRSA     infection nor to guide or     monitor treatment for     MRSA infections.     RESULT CALLED TO, READ BACK BY AND VERIFIED WITH:     SMITH,A RN 2036 12/06/13 MITCHELL,L    Medical History: Past Medical History  Diagnosis Date  . Sleep apnea   . Allergic rhinitis   .  Diastolic CHF   . Gout   . Nephrolithiasis   . Peripheral neuropathy   . Hypertension     Medications:  Prescriptions prior to admission  Medication Sig Dispense Refill  . albuterol (PROAIR HFA) 108 (90 BASE) MCG/ACT inhaler Inhale 1-2 puffs into the lungs every 6 (six) hours as needed for wheezing or shortness of breath.  1 Inhaler  5  . allopurinol (ZYLOPRIM) 300 MG tablet Take 300 mg by mouth daily.      Marland Kitchen ALPRAZolam (XANAX) 0.25 MG tablet Take 0.25 mg by mouth daily as needed for anxiety.       . carvedilol (COREG) 25 MG tablet Take 25 mg by mouth 2 (two) times daily with a meal.       . citalopram (CELEXA) 40 MG tablet Take 40 mg by mouth daily.      . cyclobenzaprine (FLEXERIL) 10 MG tablet Take 10 mg by mouth daily as needed for muscle spasms.      . Diclofenac-Misoprostol (ARTHROTEC) 75-0.2 MG TBEC Take 1 tablet by mouth 2 (two) times daily.       . fluticasone (FLONASE) 50 MCG/ACT nasal spray Place 2 sprays into both nostrils daily as needed for allergies or rhinitis.      . Gabapentin, PHN, (GRALISE) 300 MG TABS Take 900 mg by mouth 3 (three) times  daily.      Marland Kitchen HYDROcodone-acetaminophen (NORCO/VICODIN) 5-325 MG per tablet Take 1-2 tablets by mouth every 6 (six) hours as needed for moderate pain.       Marland Kitchen loratadine (CLARITIN) 10 MG tablet Take 10 mg by mouth daily as needed for allergies.      . Multiple Vitamin (MULTIVITAMIN) tablet Take 1 tablet by mouth daily. Centrum brand      . Nutritional Supplements (JUVEN PO) Take 1 packet by mouth 2 (two) times daily. Wound healing      . potassium chloride (K-DUR,KLOR-CON) 10 MEQ tablet Take 10 mEq by mouth 2 (two) times daily.      . Prenatal Vit-Fe Fumarate-FA (PRENATAL PO) Take 1 tablet by mouth daily. For wound healing      . Probiotic Product (PROBIOTIC DAILY) CAPS Take 1 capsule by mouth daily.      . pseudoephedrine (SUDAFED) 120 MG 12 hr tablet Take 120 mg by mouth daily as needed for congestion.      . vitamin C (ASCORBIC  ACID) 500 MG tablet Take 500 mg by mouth daily.       Assessment: 47yom to start Vancomycin for abdominal wound coverage. Vancomycin will also be used for surgical prophylaxis for pituitary mass procedure scheduled for 7/8 afternoon (~1530 per RN). - Weight: 211kg - CrCl > 100 ml/min  Goal of Therapy:  Vancomycin trough level 15-20 mcg/ml  Plan:  1. Vancomycin 2.5g IV x 1 tonight, then Vancomycin 1.5g IV tomorrow AM and then Vancomycin 1.5g given as pre-op antibiotic  2. Follow-up post op plan (ID recommends to stop antibiotics postoperatively)  Earleen Newport [phone] 12/09/2013,3:14 PM

## 2013-12-09 NOTE — Progress Notes (Signed)
PULMONARY / CRITICAL CARE MEDICINE   Name: [name] [name] MRN: [id number] DOB: 04-27-1966    ADMISSION DATE:  12/06/2013  REFERRING MD :  Dr. Doy Mince PRIMARY SERVICE: PCCM   CHIEF COMPLAINT:  Hypercarbic Respiratory Failure   BRIEF PATIENT DESCRIPTION: 48 y/o M with a PMH of HTN, dCHF, OSA who presented to [facility] ER with 2 day hx of HA, blurry vision, & 1 episode of dark emesis (? If blood).  CT evaluation noted sellar & suprasellar mass lesion.  Developed hypercarbic respiratory failure in ER requiring bipap.  PCCM consulted for ICU admission.   SIGNIFICANT EVENTS: 7/04 - Admit with HA, blurry vision, vomiting (dark emesis, ? blood). CT positive for suprasellar mass 7/5 BIPAP overnight 7/6 significant AMS, improving with BiPAP  STUDIES:  7/04 - CT head >> prominent, large sellar & suprasellar mass lesion with extension into the R cavernous sinus, ssuperior extension beyond the diaphragm sella. Osseous destruction with extension into sphenoid sinus.  LINES / TUBES: R PICC line 7/6>>>  CULTURES: Blood 7/7>>>  ANTIBIOTICS: Diflucan (yeast RLQ wound) 7/4>>>7/6  SUBJECTIVE:  BIPAP overnight, much more alert and interactive today but breathing is labored.  VITAL SIGNS: Temp:  [97.9 F (36.6 C)-98.7 F (37.1 C)] 98.4 F (36.9 C) (07/07 0700) Pulse Rate:  [62-94] 75 (07/07 0900) Resp:  [13-27] 13 (07/07 0900) BP: (120-166)/(76-97) 166/95 mmHg (07/07 0900) SpO2:  [87 %-100 %] 99 % (07/07 0900) FiO2 (%):  [50 %] 50 % (07/07 0800) Weight:  [466 lb (211.376 kg)] 466 lb (211.376 kg) (07/07 0500) HEMODYNAMICS:   VENTILATOR SETTINGS: Vent Mode:  [-]  FiO2 (%):  [50 %] 50 %  INTAKE / OUTPUT: Intake/Output     07/06 0701 - 07/07 0700 07/07 0701 - 07/08 0700   P.O. 470 150   I.V. (mL/kg)     IV Piggyback 50    Total Intake(mL/kg) 520 (2.5) 150 (0.7)   Urine (mL/kg/hr) 3225 (0.6)    Drains 150 (0)    Total Output 3375     Net -2855 +150         PHYSICAL EXAMINATION:  Gen:  morbidly obese, somewhat labored breathing on BiPAP HEENT: NCAT, right eye ptosis but pupils are reactive PULM: Distant BS diffusely CV: RRR, no mgr, no JVD AB: BS+, soft, nontender, no hsm Ext: warm, + leg edema, no clubbing, no cyanosis Derm: R abdominal wound vac Neuro: Asleep but arouses easily, interactive, asking questions about his condition  LABS:  CBC  Recent Labs Lab 12/07/13 0234 12/08/13 0310 12/09/13 0700  WBC 8.2 8.0 7.0  HGB 13.4 12.8* 12.7*  HCT 44.6 42.8 42.6  PLT 144* 131* 120*   Coag's No results found for this basename: APTT, INR,  in the last 168 hours BMET  Recent Labs Lab 12/07/13 0234 12/08/13 0310 12/09/13 0700  NA 141 138 140  K 4.3 4.3 4.4  CL 98 97 95*  CO2 33* 35* 39*  BUN 14 17 15   CREATININE 0.81 0.87 0.82  GLUCOSE 117* 119* 92   Electrolytes  Recent Labs Lab 12/07/13 0234 12/08/13 0310 12/09/13 0700  CALCIUM 8.8 8.8 9.0  MG  --   --  2.1  PHOS  --   --  2.0*   Sepsis Markers  Recent Labs Lab 12/06/13 0855  LATICACIDVEN 0.8   ABG  Recent Labs Lab 12/06/13 0758 12/06/13 1012  PHART 7.218* 7.256*  PCO2ART 78.5* 76.9*  PO2ART 82.0 118.0*   Liver Enzymes  Recent Labs Lab 12/06/13  0855  AST 23  ALT 35  ALKPHOS 100  BILITOT 0.4  ALBUMIN 3.9   Cardiac Enzymes No results found for this basename: TROPONINI, PROBNP,  in the last 168 hours Glucose  Recent Labs Lab 12/06/13 2023  GLUCAP 124*   Imaging  ASSESSMENT / PLAN:  PULMONARY A: Acute on Chronic Hypercarbic Respiratory Failure > due to OSA / OHS High Risk Respiratory Failure - in setting of morbid obesity, OSA/OHS + acute narcotic usage P:   - BiPAP QHS and PRN whenever labored. - Minimize sedating/resp suppressing meds (benzo/opiates). - Monitor for mental status changes closely and airway protection.  - Spoke with patient and family extensively regarding plan of care if unable to extubate post op and whether a tracheostomy would be something  he would want and he will be discussing this with his family. - Will likely be intubated post-op for sellar mass.  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 - D/c ultram with fentanyl use. - Use NSAIDS for pain as able (toradol 7/5). - Continue home wellbutrin, gabapentin. - See endocrine for w/u.  CARDIOVASCULAR A:  Hypertension  Bradycardia  dCHF  P:  - Continue home meds:  Lasix, coreg. - Continue norvasc 5 mg PO daily with holding parameters. - ICU monitoring.  RENAL A:   No acute issues  P:   - Trend BMP. - Replace lytes as indicated. - Continue lasix 40 mg PO daily.  GASTROINTESTINAL A:   Morbid Obesity  Hematemesis - 1 episode, on NSAID at baseline.  No further bleeding  At Risk Aspiration - with hypercarbic resp failure P:   - Continue clear liquid diet. - PPI with NSAID use. - Hold diet meds.  HEMATOLOGIC A:   Mild Thrombocytopenia  P:  - DVT:  SQ Heparin.  INFECTIOUS A:   RLQ Chronic Wound VAC Yeast RLQ  P:   - Monitor fever curve / leukocytosis  - IV diflucan x 3 days complete. - Continue topical anti-fungal. - WOC following for [facility] assessment & changes.  Changed at home M/W/F - Consult surgery for wound evaluation. - Consult ID for recommendations and surgical clearance ?IV Vanc now that PICC is in place.  ENDOCRINE A:  Sellar & Suprasellar Mass Hyperglycemia   P:   -Needs endocrine f/u post op. - Check cortisol level today. -SSI  Spoke with patient and family extensively today, they have agreed to see ID again today (called), surgery consultation called for surgical clearance, family is now more on board with the entire clinical picture and are willing to do "whatever" to get him better.  They are also discussing  options in case patient is unable to get of the ventilator post op.  I have personally obtained a history, examined the patient, evaluated laboratory and imaging results, formulated the assessment and plan and placed orders.  CRITICAL CARE: The patient is critically ill with multiple organ systems failure and requires high complexity decision making for assessment and support, frequent evaluation and titration of therapies, application of advanced monitoring technologies and extensive interpretation of multiple databases. Critical Care Time devoted to patient care services described in this note is 35 minutes.   Rush Farmer, M.D. [facility] Pulmonary/Critical Care Medicine. Pager: [phone]. After hours pager: [phone].  12/09/2013, 10:43 AM

## 2013-12-09 NOTE — Consult Note (Signed)
Reason for Consult: RLQ abdominal wall wound Referring Physician: Dr. Jennet Maduro   HPI: [name] [name] is a 20 male with a history of OSA, dCHF, HTN, laparoscopic sigmoid colectomy, incisional hernia repair and the removal of hernia mesh for infection in 2008 and chronic RLQ abdominal wound.  He underwent a  I&D 06/06/13 and 06/09/13 by Dr. Arta Bruce in [place].  At this time the wound was  1.8x18x14cm in size.  He was treated with Cubicin 88 days for proteus mirabilis, He has since been followed by the wound center at [facility]  He has had a wound VAC since then.   He presented to [facility] with headaches and blurred vision.  He was found to have a suprasellar mass and was admitted to the ICU.  Neurosurgery was consulted who recommended surgery, however, wanted evaluation of abdominal wall wound for infection prior to proceeding with surgery.  No leukocytosis since admission.  He is afebrile.  VSS.  He is on diflucan for surrounding yeast.  VAC changes MWF.    Past Medical History  Diagnosis Date  . Sleep apnea   . Allergic rhinitis   . Diastolic CHF   . Gout   . Nephrolithiasis   . Peripheral neuropathy   . Hypertension     Past Surgical History  Procedure Laterality Date  . Sigmoid colonoscopy    . Hernia repair    . Abdominal surgery      for cyst with MRSA    Family History  Problem Relation Age of Onset  . Hypertension Mother   . Hypertension Father   . Gout Father   . Hypothyroidism Father   . Gout Brother     Social History:  reports that he has never smoked. He has never used smokeless tobacco. He reports that he does not drink alcohol or use illicit drugs.  Allergies:  Allergies  Allergen Reactions  . Penicillins Anaphylaxis and Other (See Comments)    Unknown reaction  . Morphine And Related Hives and Itching    Medications:  Scheduled Meds: . amLODipine  5 mg Oral Daily  . antiseptic oral rinse  15 mL Mouth Rinse BID  . buPROPion  300 mg Oral Daily   . carvedilol  12.5 mg Oral BID WC  . Chlorhexidine Gluconate Cloth  6 each Topical Q0600  . furosemide  40 mg Oral Daily  . gabapentin  900 mg Oral QHS  . heparin  5,000 Units Subcutaneous 3 times per day  . multivitamin with minerals  1 tablet Oral Daily  . mupirocin ointment  1 application Nasal BID  . pantoprazole (PROTONIX) IV  40 mg Intravenous Q24H  . potassium chloride SA  20 mEq Oral BID  . sodium chloride  10-40 mL Intracatheter Q12H   Continuous Infusions:  PRN Meds:.sodium chloride, acetaminophen, fentaNYL, HYDROcodone-acetaminophen, lidocaine, ondansetron (ZOFRAN) IV, sodium chloride   Results for orders placed during the hospital encounter of 12/06/13 (from the past 48 hour(s))  BASIC METABOLIC PANEL     Status: Abnormal   Collection Time    12/08/13  3:10 AM      Result Value Ref Range   Sodium 138  137 - 147 mEq/L   Potassium 4.3  3.7 - 5.3 mEq/L   Chloride 97  96 - 112 mEq/L   CO2 35 (*) 19 - 32 mEq/L   Glucose, Bld 119 (*) 70 - 99 mg/dL   BUN 17  6 - 23 mg/dL   Creatinine, Ser 0.87  0.50 - 1.35 mg/dL   Calcium 8.8  8.4 - 10.5 mg/dL   GFR calc non Af Amer >90  >90 mL/min   GFR calc Af Amer >90  >90 mL/min   Comment: (NOTE)     The eGFR has been calculated using the CKD EPI equation.     This calculation has not been validated in all clinical situations.     eGFR's persistently <90 mL/min signify possible Chronic Kidney     Disease.   Anion gap 6  5 - 15  CBC WITH DIFFERENTIAL     Status: Abnormal   Collection Time    12/08/13  3:10 AM      Result Value Ref Range   WBC 8.0  4.0 - 10.5 K/uL   RBC 4.71  4.22 - 5.81 MIL/uL   Hemoglobin 12.8 (*) 13.0 - 17.0 g/dL   HCT 42.8  39.0 - 52.0 %   MCV 90.9  78.0 - 100.0 fL   MCH 27.2  26.0 - 34.0 pg   MCHC 29.9 (*) 30.0 - 36.0 g/dL   RDW 17.8 (*) 11.5 - 15.5 %   Platelets 131 (*) 150 - 400 K/uL   Neutrophils Relative % 84 (*) 43 - 77 %   Neutro Abs 6.7  1.7 - 7.7 K/uL   Lymphocytes Relative 8 (*) 12 - 46 %    Lymphs Abs 0.6 (*) 0.7 - 4.0 K/uL   Monocytes Relative 8  3 - 12 %   Monocytes Absolute 0.7  0.1 - 1.0 K/uL   Eosinophils Relative 0  0 - 5 %   Eosinophils Absolute 0.0  0.0 - 0.7 K/uL   Basophils Relative 0  0 - 1 %   Basophils Absolute 0.0  0.0 - 0.1 K/uL  CBC     Status: Abnormal   Collection Time    12/09/13  7:00 AM      Result Value Ref Range   WBC 7.0  4.0 - 10.5 K/uL   RBC 4.74  4.22 - 5.81 MIL/uL   Hemoglobin 12.7 (*) 13.0 - 17.0 g/dL   HCT 42.6  39.0 - 52.0 %   MCV 89.9  78.0 - 100.0 fL   MCH 26.8  26.0 - 34.0 pg   MCHC 29.8 (*) 30.0 - 36.0 g/dL   RDW 17.2 (*) 11.5 - 15.5 %   Platelets 120 (*) 150 - 400 K/uL  BASIC METABOLIC PANEL     Status: Abnormal   Collection Time    12/09/13  7:00 AM      Result Value Ref Range   Sodium 140  137 - 147 mEq/L   Potassium 4.4  3.7 - 5.3 mEq/L   Chloride 95 (*) 96 - 112 mEq/L   CO2 39 (*) 19 - 32 mEq/L   Glucose, Bld 92  70 - 99 mg/dL   BUN 15  6 - 23 mg/dL   Creatinine, Ser 0.82  0.50 - 1.35 mg/dL   Calcium 9.0  8.4 - 10.5 mg/dL   GFR calc non Af Amer >90  >90 mL/min   GFR calc Af Amer >90  >90 mL/min   Comment: (NOTE)     The eGFR has been calculated using the CKD EPI equation.     This calculation has not been validated in all clinical situations.     eGFR's persistently <90 mL/min signify possible Chronic Kidney     Disease.   Anion gap 6  5 - 15  MAGNESIUM  Status: None   Collection Time    12/09/13  7:00 AM      Result Value Ref Range   Magnesium 2.1  1.5 - 2.5 mg/dL  PHOSPHORUS     Status: Abnormal   Collection Time    12/09/13  7:00 AM      Result Value Ref Range   Phosphorus 2.0 (*) 2.3 - 4.6 mg/dL    [name]  12/07/2013   CLINICAL DATA:  pituitary tumor  EXAM: MRI HEAD WITHOUT AND WITH CONTRAST  TECHNIQUE: Multiplanar, multiecho pulse sequences of the brain and surrounding structures were obtained without and with intravenous contrast.  CONTRAST:  62m MULTIHANCE GADOBENATE DIMEGLUMINE 529  MG/ML IV SOLN  COMPARISON:  Head CT 12/06/2013  FINDINGS: The study is degraded by considerable motion. Patient's body habitus prevents use of the best coil configuration.  Diffusion imaging does not show any acute or subacute infarction. There are mild chronic appearing small vessel changes of the cerebral hemispheric white matter. No cortical insult. No intra-axial mass, hemorrhage, hydrocephalus or extra-axial fluid collection.  There is an invasive mass involving the sella, clivus, sphenoid sinus and suprasellar region. This measures 3.7 cm cephalocaudal, 4.1 cm front to back, and 3.4 cm right to left. There is invasion of the cavernous sinus on the right, surrounding the carotid artery. Early invasion on the left is possible. The component extending into the suprasellar region measures 14 mm in diameter and indents the undersurface of the optic chiasm. The lesion shows variable enhancement. I do not see cystic change or evidence of calcification by MR.  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 Differential diagnosis does include metastatic disease, chordoma and craniopharyngioma.  IMPRESSION: 3.7 x 4.1 x 3.4 cm mass with the epicenter in the region of the sella/clivus. There is invasion of the bony clivus, invasion of the sphenoid sinus and extension into the suprasellar region. There is definite cavernous sinus invasion on the right and possible or early cavernous sinus invasion on the left. 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 The differential diagnosis does include metastatic disease, chordoma and craniopharyngioma.   Electronically Signed   By: MNelson ChimesM.D.   On: 12/07/2013 16:10   Dg Chest Port 1 View  12/09/2013   CLINICAL DATA:  Shortness of Breath  EXAM: PORTABLE CHEST - 1 VIEW  COMPARISON:  November 08, 2013  FINDINGS: Central catheter tip is at the cavoatrial junction. No pneumothorax. There is mild right base atelectatic change. Lungs are otherwise  clear. Heart is mildly enlarged but stable. The pulmonary vascularity is within normal limits. No adenopathy.  IMPRESSION: No frank edema or consolidation. Mild right base atelectasis. Stable cardiac enlargement. No pneumothorax.   Electronically Signed   By: WLowella GripM.D.   On: 12/09/2013 07:32   Dg Chest Port 1 View  12/08/2013   CLINICAL DATA:  PICC line placement.  EXAM: PORTABLE CHEST - 1 VIEW  COMPARISON:  12/07/2013  FINDINGS: Interval placement of a right PICC catheter with tip over the low SVC region. No pneumothorax. Shallow inspiration with elevation of the right hemidiaphragm. Atelectasis in the lung bases. Cardiac enlargement without significant vascular congestion.  IMPRESSION: Right PICC catheter tip overlies the lower SVC.  No pneumothorax.   Electronically Signed   By: WLucienne CapersM.D.   On: 12/08/2013 21:39    Review of Systems  All other systems reviewed and are negative.  Blood pressure 166/95, pulse 75, temperature 98.4  F (36.9 C), temperature source Axillary, resp. rate 13, height _0  (1.803 m), weight 466 lb (211.376 kg), SpO2 99.00%. Physical Exam  Constitutional: He is oriented to person, place, and time. He appears well-developed and well-nourished. No distress.  Cardiovascular: Normal rate, regular rhythm, normal heart sounds and intact distal pulses.  Exam reveals no gallop and no friction rub.   No murmur heard. Respiratory: Effort normal and breath sounds normal. No respiratory distress. He has no wheezes. He has no rales. He exhibits no tenderness.  GI: Soft. Bowel sounds are normal. He exhibits no distension. There is no tenderness.  Depth 9cm Length 6.5cm Width 2.5cm Tunnels-11cm 2-3 oclock  Beefy red, no purulent drainage.  Musculoskeletal: Normal range of motion. He exhibits no edema and no tenderness.  Neurological: He is alert and oriented to person, place, and time.  Skin: He is not diaphoretic.  Psychiatric: He has a normal mood and  affect. His behavior is normal. Judgment and thought content normal.      Assessment/Plan: Right abdominal wall wound: the wound is clean and no signs of active infection at this time.  Continue with wound VAC and outpatient follow up at the wound center.  May proceed with surgery per General Surgery standpoint.  Pre-operative antibiotics per ID.  Thank you for the consult.  Surgery singing off, please do not hesitate to contact [facility] with any questions or concerns.    Erby Pian  ANP-BC  Pager [phone] 12/09/2013, 10:40 AM

## 2013-12-09 NOTE — Consult Note (Signed)
WOC wound follow up  Injected foam with 5cc of lidocaine prior to foam removal   Wound type: surgical/non healing; RLQ Measurement: 2.5cm x 6.0cm x 9cm with 11 cm tract at 2-3 oclock.  Note yesterday I was not able to visualize the base of this wound, today with assistance from surgery noted one additional pc of black foam at the base which has been removed without difficultly today and has change the depth on my measurements.  Dressing label was not labeled with the number of sponges in the wound and the bridge. Discussed with patient and family that moving forward we would use only one large pc of black foam cut in spiral to fill the wound and the tract.  Wound DQQ:IWLNL, inspected with ID and [facility] NP at the bedside, photo per [facility]. Base visualized without difficult.  100% granulation tissue Drainage (amount, consistency, odor) minimal in canister and no pooling at the time of the dressing removal Periwound: candida overgrowth has improved, less drape used and crusted with antifungal powder yesterday Dressing procedure/placement/frequency: 1pc of black granufoam cut into spiral and use to fill the 11cc tract and then to fill the remainder of the wound bed, window framed the wound with drape after crusting with antifungal powder and no sting skin prep.  Avoided area on the periwound with most intense satellite lesion again today. Bridge towards the umbilicus today and protected skin with skin prep.  Pt continues to have yeast like odor, used wound cleanser today for this reason.  Explained rationale for not using Dakins solution in a clean granulating wound.   Discussed dressing change frequency with patient and family, we agreed to change next on Friday July 10th, to keep on M/W/F schedule to allow for WOC to complete dressing change.  WOC will follow along with you for [facility] changes Shequila Neglia [facility] RN,CWOCN [phone]

## 2013-12-09 NOTE — Progress Notes (Signed)
Subjective: Patient stable.  In process of being evaluated by multiple services to optimize him for surgery.    Objective: Current vital signs: BP 144/95  Pulse 73  Temp(Src) 98.2 F (36.8 C) (Oral)  Resp 14  Ht 5\' 11"  (1.803 m)  Wt 211.376 kg (466 lb)  BMI 65.02 kg/m2  SpO2 98% Vital signs in last 24 hours: Temp:  [98 F (36.7 C)-98.7 F (37.1 C)] 98.2 F (36.8 C) (07/07 1156) Pulse Rate:  [62-94] 73 (07/07 1300) Resp:  [8-27] 14 (07/07 1300) BP: (120-168)/(79-102) 144/95 mmHg (07/07 1300) SpO2:  [87 %-100 %] 98 % (07/07 1300) FiO2 (%):  [50 %] 50 % (07/07 0800) Weight:  [211.376 kg (466 lb)] 211.376 kg (466 lb) (07/07 0500)  Intake/Output from previous day: 07/06 0701 - 07/07 0700 In: 520 [P.O.:470; IV Piggyback:50] Out: 1540 [Urine:3225; Drains:150] Intake/Output this shift: Total I/O In: 390 [P.O.:390] Out: 850 [Urine:850] Nutritional status: Clear Liquid  Neurologic Exam: Awake and alert. Oriented. Follows commands. Speech fluent without evidence of aphasia.  Cranial Nerves:  II: Discs flat bilaterally; Visual fields grossly normal, left pupil 29mm and reactive, right pupil 2mm and unreactive  III,IV, VI: right ptosis, Neither eye goes beyond midline with left lateral gaze. Limited right lateral with right eye. Left eye goes in and down with right lateral gaze. No upward or downward movement of the right eye. Upward and downward movement intact in the left eye.  V,VII: decrease in left NLF, facial light touch sensation normal bilaterally  VIII: hearing normal bilaterally  IX,X: gag reflex present  XI: bilateral shoulder shrug  XII: rightward tongue extension  Motor:  5/5 throughout  Tone and bulk:normal tone throughout; no atrophy noted  Sensory: Pinprick and light touch decreased in the left upper and lower extremity  Deep Tendon Reflexes: 2+ in the upper extremities and absent in the lower extremities  Plantars:  Right: mute   Left: mute    Lab  Results: Basic Metabolic Panel:  Recent Labs Lab 12/06/13 0855 12/07/13 0234 12/08/13 0310 12/09/13 0700  NA 138 141 138 140  K 3.9 4.3 4.3 4.4  CL 98 98 97 95*  CO2 30 33* 35* 39*  GLUCOSE 148* 117* 119* 92  BUN 19 14 17 15   CREATININE 0.75 0.81 0.87 0.82  CALCIUM 8.4 8.8 8.8 9.0  MG  --   --   --  2.1  PHOS  --   --   --  2.0*    Liver Function Tests:  Recent Labs Lab 12/06/13 0855  AST 23  ALT 35  ALKPHOS 100  BILITOT 0.4  PROT 7.1  ALBUMIN 3.9   No results found for this basename: LIPASE, AMYLASE,  in the last 168 hours No results found for this basename: AMMONIA,  in the last 168 hours  CBC:  Recent Labs Lab 12/06/13 0855 12/07/13 0234 12/08/13 0310 12/09/13 0700  WBC 10.2 8.2 8.0 7.0  NEUTROABS 9.0*  --  6.7  --   HGB 13.7 13.4 12.8* 12.7*  HCT 45.9 44.6 42.8 42.6  MCV 90.0 91.4 90.9 89.9  PLT 146* 144* 131* 120*    Cardiac Enzymes: No results found for this basename: CKTOTAL, CKMB, CKMBINDEX, TROPONINI,  in the last 168 hours  Lipid Panel: No results found for this basename: CHOL, TRIG, HDL, CHOLHDL, VLDL, LDLCALC,  in the last 168 hours  CBG:  Recent Labs Lab 12/06/13 2023  GLUCAP 124*    Microbiology: Results for orders placed during the  hospital encounter of 12/06/13  MRSA PCR SCREENING     Status: Abnormal   Collection Time    12/06/13  5:15 PM      Result Value Ref Range Status   MRSA by PCR POSITIVE (*) NEGATIVE Final   Comment:            The GeneXpert MRSA Assay (FDA     approved for NASAL specimens     only), is one component of a     comprehensive MRSA colonization     surveillance program. It is not     intended to diagnose MRSA     infection nor to guide or     monitor treatment for     MRSA infections.     RESULT CALLED TO, READ BACK BY AND VERIFIED WITH:     SMITH,A RN [id number] 12/06/13 MITCHELL,L    Coagulation Studies: No results found for this basename: LABPROT, INR,  in the last 72 hours  Imaging: [name]  Wo Contrast  12/07/2013   CLINICAL DATA:  pituitary tumor  EXAM: MRI HEAD WITHOUT AND WITH CONTRAST  TECHNIQUE: Multiplanar, multiecho pulse sequences of the brain and surrounding structures were obtained without and with intravenous contrast.  CONTRAST:  55mL MULTIHANCE GADOBENATE DIMEGLUMINE 529 MG/ML IV SOLN  COMPARISON:  Head CT 12/06/2013  FINDINGS: The study is degraded by considerable motion. Patient's body habitus prevents use of the best coil configuration.  Diffusion imaging does not show any acute or subacute infarction. There are mild chronic appearing small vessel changes of the cerebral hemispheric white matter. No cortical insult. No intra-axial mass, hemorrhage, hydrocephalus or extra-axial fluid collection.  There is an invasive mass involving the sella, clivus, sphenoid sinus and suprasellar region. This measures 3.7 cm cephalocaudal, 4.1 cm front to back, and 3.4 cm right to left. There is invasion of the cavernous sinus on the right, surrounding the carotid artery. Early invasion on the left is possible. The component extending into the suprasellar region measures 14 mm in diameter and indents the undersurface of the optic chiasm. The lesion shows variable enhancement. I do not see cystic change or evidence of calcification by MR.  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 Differential diagnosis does include metastatic disease, chordoma and craniopharyngioma.  IMPRESSION: 3.7 x 4.1 x 3.4 cm mass with the epicenter in the region of the sella/clivus. There is invasion of the bony clivus, invasion of the sphenoid sinus and extension into the suprasellar region. There is definite cavernous sinus invasion on the right and possible or early cavernous sinus invasion on the left. 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 The differential diagnosis does include metastatic disease, chordoma and craniopharyngioma.   Electronically Signed   By: Nelson Chimes M.D.    On: 12/07/2013 16:10   Dg Chest Port 1 View  12/09/2013   CLINICAL DATA:  Shortness of Breath  EXAM: PORTABLE CHEST - 1 VIEW  COMPARISON:  November 08, 2013  FINDINGS: Central catheter tip is at the cavoatrial junction. No pneumothorax. There is mild right base atelectatic change. Lungs are otherwise clear. Heart is mildly enlarged but stable. The pulmonary vascularity is within normal limits. No adenopathy.  IMPRESSION: No frank edema or consolidation. Mild right base atelectasis. Stable cardiac enlargement. No pneumothorax.   Electronically Signed   By: Lowella Grip M.D.   On: 12/09/2013 07:32   Dg Chest Port 1 View  12/08/2013   CLINICAL DATA:  PICC line placement.  EXAM: PORTABLE CHEST - 1 VIEW  COMPARISON:  12/07/2013  FINDINGS: Interval placement of a right PICC catheter with tip over the low SVC region. No pneumothorax. Shallow inspiration with elevation of the right hemidiaphragm. Atelectasis in the lung bases. Cardiac enlargement without significant vascular congestion.  IMPRESSION: Right PICC catheter tip overlies the lower SVC.  No pneumothorax.   Electronically Signed   By: Lucienne Capers M.D.   On: 12/08/2013 21:39    Medications:  I have reviewed the patient's current medications. Scheduled: . amLODipine  5 mg Oral Daily  . antiseptic oral rinse  15 mL Mouth Rinse BID  . buPROPion  300 mg Oral Daily  . carvedilol  12.5 mg Oral BID WC  . Chlorhexidine Gluconate Cloth  6 each Topical Q0600  . furosemide  40 mg Oral Daily  . gabapentin  900 mg Oral QHS  . heparin  5,000 Units Subcutaneous 3 times per day  . multivitamin with minerals  1 tablet Oral Daily  . mupirocin ointment  1 application Nasal BID  . pantoprazole (PROTONIX) IV  40 mg Intravenous Q24H  . potassium chloride SA  20 mEq Oral BID  . sodium chloride  10-40 mL Intracatheter Q12H    Assessment/Plan: Patient with a stable neurological examination today.  [place]ck and IGF-1 normal.  ACTH pending  Recommendations: 1.   Will continue to follow up lab work.     LOS: 3 days   Alexis Goodell, MD [facility] [phone] 12/09/2013  2:05 PM

## 2013-12-09 NOTE — Progress Notes (Signed)
   Subjective:    Patient ID: [name] [name], male    DOB: 25-Dec-1965, 48 y.o.   MRN: [id number]  Headache       Review of Systems  Neurological: Positive for headaches.       Objective:   Physical Exam        Assessment & Plan:

## 2013-12-09 NOTE — Progress Notes (Signed)
INITIAL NUTRITION ASSESSMENT  DOCUMENTATION CODES Per approved criteria  -Morbid Obesity   INTERVENTION: Juven BID (contains conditionally essential amino acids, arginine and glutamine) 30 ml Prostat TID, provides 100 kcal and 15 grams of protein per 30 ml  NUTRITION DIAGNOSIS: Increased nutrient needs related to extensive wound as evidenced by estimated needs.   Goal: Pt to meet >/= 90% of their estimated nutrition needs   Monitor:  PO intake, supplement acceptance, weight trend, labs   Reason for Assessment: Consult received to initiate and manage enteral nutrition support.  48 y.o. male  Admitting Dx: Suprasellar mass  ASSESSMENT: 70 male with a history of OSA, dCHF, HTN, laparoscopic sigmoid colectomy, incisional hernia repair and the removal of hernia mesh for infection in 2008 and chronic RLQ abdominal wound. He underwent a I&D 06/06/13 and 06/09/13 by Dr. Arta Bruce in [place]. He has been followed by wound center at [facility].  He presented to [facility] with headaches and blurred vision. He was found to have a suprasellar mass and was admitted to the ICU. 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  Plan for surgery after consultations with ID and surgery.   Phosphorus level low.   Pt and mom provide hx. Per pt he has been on the [employer] fast weight loss program through a physician PTA. He has lost 40 lb on the program. He has 2 eggs and one strip of bacon for Breakfast, lunch is a leafy green salad with 4 ounces of protein, dinner is the same. Pt does have a couple of snacks from allowed foods. Per mom pt has been very compliant. Pt developed a large abdominal wound in Jan 2015. Pt has been on Juven PTA and likes this supplement. Pt and mom are very agreeable and appreciative. Pt states he is willing to do whatever he needs to do. Pt seems anxious about surgery.  Nutrition-focused physical exam does not identify any fat or muscle depletion.   Height: Ht Readings from  Last 1 Encounters:  12/06/13 5\' 11"  (1.803 m)    Weight: Wt Readings from Last 1 Encounters:  12/09/13 466 lb (211.376 kg)  Per pt 418 lb (190 kg) on admission  Ideal Body Weight: 78.1 kg   % Ideal Body Weight: 243%  Wt Readings from Last 10 Encounters:  12/09/13 466 lb (211.376 kg)  10/06/13 420 lb (190.511 kg)    Usual Body Weight: 438 lb to 418 lb with wt loss  % Usual Body Weight: within range  BMI:  Body mass index is 65.02 kg/(m^2).  Estimated Nutritional Needs: Kcal: 1900-2100 Protein: 140-160 Fluid: >1.9 L/day  Skin: open abd wound  Diet Order: Clear Liquid  EDUCATION NEEDS: -No education needs identified at this time   Intake/Output Summary (Last 24 hours) at 12/09/13 1219 Last data filed at 12/09/13 1200  Gross per 24 hour  Intake    560 ml  Output   1550 ml  Net   -990 ml    Last BM: PTA   Labs:   Recent Labs Lab 12/07/13 0234 12/08/13 0310 12/09/13 0700  NA 141 138 140  K 4.3 4.3 4.4  CL 98 97 95*  CO2 33* 35* 39*  BUN 14 17 15   CREATININE 0.81 0.87 0.82  CALCIUM 8.8 8.8 9.0  MG  --   --  2.1  PHOS  --   --  2.0*  GLUCOSE 117* 119* 92    CBG (last 3)   Recent Labs  12/06/13 2023  GLUCAP  124*   Scheduled Meds: . amLODipine  5 mg Oral Daily  . antiseptic oral rinse  15 mL Mouth Rinse BID  . buPROPion  300 mg Oral Daily  . carvedilol  12.5 mg Oral BID WC  . Chlorhexidine Gluconate Cloth  6 each Topical Q0600  . furosemide  40 mg Oral Daily  . gabapentin  900 mg Oral QHS  . heparin  5,000 Units Subcutaneous 3 times per day  . multivitamin with minerals  1 tablet Oral Daily  . mupirocin ointment  1 application Nasal BID  . pantoprazole (PROTONIX) IV  40 mg Intravenous Q24H  . potassium chloride SA  20 mEq Oral BID  . sodium chloride  10-40 mL Intracatheter Q12H    Continuous Infusions:   Past Medical History  Diagnosis Date  . Sleep apnea   . Allergic rhinitis   . Diastolic CHF   . Gout   . Nephrolithiasis   .  Peripheral neuropathy   . Hypertension     Past Surgical History  Procedure Laterality Date  . Sigmoid colonoscopy    . Hernia repair    . Abdominal surgery      for cyst with MRSA    Maylon Peppers RD, LDN, CNSC [phone] Pager [phone] After Hours Pager

## 2013-12-09 NOTE — Progress Notes (Signed)
Patient not ready to go on CPAP at this time.  Patient is on 2L [place] and tolerating well.  RT will place patient on CPAP when patient is ready to go to bed.  RT will continue to [facility]

## 2013-12-09 NOTE — Progress Notes (Signed)
Patient ID: [name] [name], male   DOB: 1965/08/16, 48 y.o.   MRN: [id number] Seen by general surgery, ent, ID. WILL GO AHEAD WITH SURGERY IN AM . SPOKE WITH MOTHER.will follow recommendations of ID

## 2013-12-09 NOTE — Progress Notes (Signed)
eLink Physician-Brief Progress Note Patient Name: [name] [name] DOB: [birth year] MRN: [id number]  Date of Service  12/09/2013   HPI/Events of Note   RN has questions on diflucan  eICU Interventions  D./ w Dr Tommy Medal  - diflucan 100mg  tab once daily; he will set stop date   Intervention Category Minor Interventions: Routine modifications to care plan (e.g. PRN medications for pain, fever)  [name] [name] 12/09/2013, 5:11 PM

## 2013-12-09 NOTE — Consult Note (Addendum)
[facility] for Infectious Disease    Date of Admission:  12/06/2013  Date of Consult:  12/09/2013  Reason for Consult:history of chronic abdominal wound with MSSA, MRSA, Group B strep, proteus in the past, now needing biopsy of pituitary tumor Referring Physician: Dr. Joya Salm   HPI: [name] [name] is an 48 y.o. male with history of  hernia repair complicated by chronic abdominal infection first identified in July of 2013 on CT scan in [place]. This area was aspirated and cultured it initially grew methicillin sensitive staph aureus. It failed to resolve and he received numerous courses of antibiotics with numerous aspirations of the area. Ultimately he underwent formal incision and debridement in [place] in January 2015.  The records that are available in Epic of the care everywhere show that he grew various organisms from his intra-abdominal wound most recently he has consistently grown methicillin sensitive staph aureus and Proteus mirabilis that was sensitive to all antibiotics tested. He hasn't passed also grown group B strep as well as methicillin-resistant staph aureus.  He has been cared for by Dr. Linton Ham in the wound care clinic. He has not been systemically ill and has not been in need of oral or IV antibiotics in recent times. He has had a candidal overgrowth around this chronic open wound and is currently on fluconazole.  We're consulted due to concern that is open abdominal wound and chronic infection might compromise the safety of surgery to biopsy his pituitary mass.  Based on my review of the patient's history and his findings when he presented to the [place] I find no evidence that he has Sirs or something that would suggest bacteremia. Today I was able to examine the wound thoroughly with general surgery and wound care and eventually no purulence or anything to suggest an abscess or active infection in this wound.     Past Medical History  Diagnosis  Date  . Sleep apnea   . Allergic rhinitis   . Diastolic CHF   . Gout   . Nephrolithiasis   . Peripheral neuropathy   . Hypertension     Past Surgical History  Procedure Laterality Date  . Sigmoid colonoscopy    . Hernia repair    . Abdominal surgery      for cyst with MRSA  ergies:   Allergies  Allergen Reactions  . Penicillins Anaphylaxis and Other (See Comments)    Unknown reaction  . Morphine And Related Hives and Itching     Medications: I have reviewed patients current medications as documented in Epic Anti-infectives   Start     Dose/Rate Route Frequency Ordered Stop   12/06/13 1400  fluconazole (DIFLUCAN) IVPB 100 mg     100 mg 50 mL/hr over 60 Minutes Intravenous Every 24 hours 12/06/13 1341 12/08/13 1513      Social History:  reports that he has never smoked. He has never used smokeless tobacco. He reports that he does not drink alcohol or use illicit drugs.  Family History  Problem Relation Age of Onset  . Hypertension Mother   . Hypertension Father   . Gout Father   . Hypothyroidism Father   . Gout Brother     As in HPI and primary teams notes otherwise 12 point review of systems is negative  Blood pressure 162/91, pulse 72, temperature 98.2 F (36.8 C), temperature source Oral, resp. rate 8, height _0  (1.803 m), weight 466 lb (211.376 kg), SpO2 95.00%. General: Alert and awake,  oriented x3, not in any acute distress. HEENT: anicteric sclera, pupils reactive to light and accommodation, EOMI, oropharynx clear and without exudate CVS regular rate,  Chest:  no wheezing, rales or rhonchi Abdomen: soft nontender, nondistended,   Skin:  Open abdominal wound with granulation tissue:       Neuro: significant right sided ptosis  Results for orders placed during the hospital encounter of 12/06/13 (from the past 48 hour(s))  BASIC METABOLIC PANEL     Status: Abnormal   Collection Time    12/08/13  3:10 AM      Result Value Ref Range   Sodium  138  137 - 147 mEq/L   Potassium 4.3  3.7 - 5.3 mEq/L   Chloride 97  96 - 112 mEq/L   CO2 35 (*) 19 - 32 mEq/L   Glucose, Bld 119 (*) 70 - 99 mg/dL   BUN 17  6 - 23 mg/dL   Creatinine, Ser 0.87  0.50 - 1.35 mg/dL   Calcium 8.8  8.4 - 10.5 mg/dL   GFR calc non Af Amer >90  >90 mL/min   GFR calc Af Amer >90  >90 mL/min   Comment: (NOTE)     The eGFR has been calculated using the CKD EPI equation.     This calculation has not been validated in all clinical situations.     eGFR's persistently <90 mL/min signify possible Chronic Kidney     Disease.   Anion gap 6  5 - 15  CBC WITH DIFFERENTIAL     Status: Abnormal   Collection Time    12/08/13  3:10 AM      Result Value Ref Range   WBC 8.0  4.0 - 10.5 K/uL   RBC 4.71  4.22 - 5.81 MIL/uL   Hemoglobin 12.8 (*) 13.0 - 17.0 g/dL   HCT 42.8  39.0 - 52.0 %   MCV 90.9  78.0 - 100.0 fL   MCH 27.2  26.0 - 34.0 pg   MCHC 29.9 (*) 30.0 - 36.0 g/dL   RDW 17.8 (*) 11.5 - 15.5 %   Platelets 131 (*) 150 - 400 K/uL   Neutrophils Relative % 84 (*) 43 - 77 %   Neutro Abs 6.7  1.7 - 7.7 K/uL   Lymphocytes Relative 8 (*) 12 - 46 %   Lymphs Abs 0.6 (*) 0.7 - 4.0 K/uL   Monocytes Relative 8  3 - 12 %   Monocytes Absolute 0.7  0.1 - 1.0 K/uL   Eosinophils Relative 0  0 - 5 %   Eosinophils Absolute 0.0  0.0 - 0.7 K/uL   Basophils Relative 0  0 - 1 %   Basophils Absolute 0.0  0.0 - 0.1 K/uL  CBC     Status: Abnormal   Collection Time    12/09/13  7:00 AM      Result Value Ref Range   WBC 7.0  4.0 - 10.5 K/uL   RBC 4.74  4.22 - 5.81 MIL/uL   Hemoglobin 12.7 (*) 13.0 - 17.0 g/dL   HCT 42.6  39.0 - 52.0 %   MCV 89.9  78.0 - 100.0 fL   MCH 26.8  26.0 - 34.0 pg   MCHC 29.8 (*) 30.0 - 36.0 g/dL   RDW 17.2 (*) 11.5 - 15.5 %   Platelets 120 (*) 150 - 400 K/uL  BASIC METABOLIC PANEL     Status: Abnormal   Collection Time    12/09/13  7:00 AM  Result Value Ref Range   Sodium 140  137 - 147 mEq/L   Potassium 4.4  3.7 - 5.3 mEq/L   Chloride 95 (*)  96 - 112 mEq/L   CO2 39 (*) 19 - 32 mEq/L   Glucose, Bld 92  70 - 99 mg/dL   BUN 15  6 - 23 mg/dL   Creatinine, Ser 0.82  0.50 - 1.35 mg/dL   Calcium 9.0  8.4 - 10.5 mg/dL   GFR calc non Af Amer >90  >90 mL/min   GFR calc Af Amer >90  >90 mL/min   Comment: (NOTE)     The eGFR has been calculated using the CKD EPI equation.     This calculation has not been validated in all clinical situations.     eGFR's persistently <90 mL/min signify possible Chronic Kidney     Disease.   Anion gap 6  5 - 15  MAGNESIUM     Status: None   Collection Time    12/09/13  7:00 AM      Result Value Ref Range   Magnesium 2.1  1.5 - 2.5 mg/dL  PHOSPHORUS     Status: Abnormal   Collection Time    12/09/13  7:00 AM      Result Value Ref Range   Phosphorus 2.0 (*) 2.3 - 4.6 mg/dL   _0 (sdes,specrequest,cult,reptstatus)   ) Recent Results (from the past 720 hour(s))  MRSA PCR SCREENING     Status: Abnormal   Collection Time    12/06/13  5:15 PM      Result Value Ref Range Status   MRSA by PCR POSITIVE (*) NEGATIVE Final   Comment:            The GeneXpert MRSA Assay (FDA     approved for NASAL specimens     only), is one component of a     comprehensive MRSA colonization     surveillance program. It is not     intended to diagnose MRSA     infection nor to guide or     monitor treatment for     MRSA infections.     RESULT CALLED TO, READ BACK BY AND VERIFIED WITH:     SMITH,A RN 2036 12/06/13 MITCHELL,L     Impression/Recommendation  Principal Problem:   Suprasellar mass Active Problems:   OSA (obstructive sleep apnea)   Neoplasm of brain causing mass effect on adjacent structures   Acute respiratory failure with hypercapnia   Morbid obesity   [name] [name] is a 48 y.o. male with  History of chronic non healing abdominal wound due to MSSA infection of hernia repair site in July of 2013 with various organisms having been isolated from this area since then including MSSA,  MRSA, Group B strep and more recently MSSA and pan sensitive Proteus on last two cultures  #1 Abdominal wound: it DOES NOT appear grossly infected at this time. He does not seem systemically ill to suggest SIRS or bacteremia. For thoroughness sake I will check set of blood cultures but keep in mind if 1 of these is + it will likely end up being a contaminant as a coag negative staph for viridans strep.  --After we obtain the blood cultures we can start him on vancomycin and continue this through his preoperative phase --Given his history of Proteus a given desire to have some other Dr. supple antibiotics on board prior to neurosurgery I would give him a dose of Ancef  30 minutes prior to his surgery --both of these abx can be stopped postoperatively --I DO NOT think he requires antibiotics for this wound in and of itself --continue hibiclens baths daily and mupirocin 2% IN bid   I will followup his cultures  We are happy to help him in the clinic should his wound become infected again and coordiante with Dr Dellia Nims and wound care.   #2 Candida infection around wound: continue fluconazole  Please call with further questions.     12/09/2013, 12:21 PM   Thank you so much for this interesting consult  [place] [facility] [place] [phone] (pager) [phone] (office) 12/09/2013, 12:21 PM  [place] 12/09/2013, 12:21 PM

## 2013-12-10 ENCOUNTER — Encounter (HOSPITAL_COMMUNITY): Payer: Self-pay | Admitting: Anesthesiology

## 2013-12-10 ENCOUNTER — Encounter (HOSPITAL_COMMUNITY)
Admission: EM | Disposition: A | Discharge status: Rehabilitation Facility | Payer: Self-pay | Source: Home / Self Care | Attending: Pulmonary Disease

## 2013-12-10 ENCOUNTER — Other Ambulatory Visit: Payer: Self-pay | Admitting: Neurosurgery

## 2013-12-10 ENCOUNTER — Inpatient Hospital Stay (HOSPITAL_COMMUNITY): Payer: BC Managed Care – PPO | Admitting: Certified Registered Nurse Anesthetist

## 2013-12-10 ENCOUNTER — Inpatient Hospital Stay (HOSPITAL_COMMUNITY): Payer: BC Managed Care – PPO

## 2013-12-10 ENCOUNTER — Encounter (HOSPITAL_COMMUNITY): Payer: BC Managed Care – PPO | Admitting: Certified Registered Nurse Anesthetist

## 2013-12-10 DIAGNOSIS — A4902 Methicillin resistant Staphylococcus aureus infection, unspecified site: Secondary | ICD-10-CM

## 2013-12-10 DIAGNOSIS — B951 Streptococcus, group B, as the cause of diseases classified elsewhere: Secondary | ICD-10-CM

## 2013-12-10 DIAGNOSIS — K651 Peritoneal abscess: Secondary | ICD-10-CM

## 2013-12-10 HISTORY — PX: TRANSNASAL APPROACH: SHX6149

## 2013-12-10 HISTORY — PX: CRANIOTOMY: SHX93

## 2013-12-10 LAB — TRIGLYCERIDES: Triglycerides: 174 mg/dL — ABNORMAL HIGH (ref <150)

## 2013-12-10 LAB — CBC
HCT: 41 % (ref 39.0–52.0)
Hemoglobin: 12.2 g/dL — ABNORMAL LOW (ref 13.0–17.0)
MCH: 26.7 pg (ref 26.0–34.0)
MCHC: 29.8 g/dL — ABNORMAL LOW (ref 30.0–36.0)
MCV: 89.7 fL (ref 78.0–100.0)
Platelets: 127 10*3/uL — ABNORMAL LOW (ref 150–400)
RBC: 4.57 MIL/uL (ref 4.22–5.81)
RDW: 17.4 % — ABNORMAL HIGH (ref 11.5–15.5)
WBC: 5.7 10*3/uL (ref 4.0–10.5)

## 2013-12-10 LAB — BASIC METABOLIC PANEL
Anion gap: 7 (ref 5–15)
BUN: 16 mg/dL (ref 6–23)
CO2: 39 mEq/L — ABNORMAL HIGH (ref 19–32)
Calcium: 8.9 mg/dL (ref 8.4–10.5)
Chloride: 95 mEq/L — ABNORMAL LOW (ref 96–112)
Creatinine, Ser: 0.84 mg/dL (ref 0.50–1.35)
GFR calc Af Amer: >90 mL/min (ref >90)
GFR calc non Af Amer: >90 mL/min (ref >90)
Glucose, Bld: 101 mg/dL — ABNORMAL HIGH (ref 70–99)
Potassium: 3.7 mEq/L (ref 3.7–5.3)
Sodium: 141 mEq/L (ref 137–147)

## 2013-12-10 LAB — PHOSPHORUS: Phosphorus: 2 mg/dL — ABNORMAL LOW (ref 2.3–4.6)

## 2013-12-10 LAB — POCT I-STAT 3, ART BLOOD GAS (G3+)
Acid-Base Excess: 11 mmol/L — ABNORMAL HIGH (ref 0.0–2.0)
Bicarbonate: 36.2 mEq/L — ABNORMAL HIGH (ref 20.0–24.0)
O2 Saturation: 95 %
Patient temperature: 97.9
TCO2: 38 mmol/L (ref 0–100)
pCO2 arterial: 47.6 mmHg — ABNORMAL HIGH (ref 35.0–45.0)
pH, Arterial: 7.488 — ABNORMAL HIGH (ref 7.350–7.450)
pO2, Arterial: 67 mmHg — ABNORMAL LOW (ref 80.0–100.0)

## 2013-12-10 LAB — MAGNESIUM: Magnesium: 2.1 mg/dL (ref 1.5–2.5)

## 2013-12-10 LAB — CORTISOL: Cortisol, Plasma: 21.1 ug/dL

## 2013-12-10 SURGERY — CRANIOTOMY HYPOPHYSECTOMY TRANSNASAL APPROACH
Anesthesia: General

## 2013-12-10 MED ORDER — THROMBIN 5000 UNITS EX SOLR
CUTANEOUS | Status: DC | PRN
  Administered 2013-12-10 (×2): 5000 [IU] via TOPICAL

## 2013-12-10 MED ORDER — ONDANSETRON HCL 4 MG/2ML IJ SOLN
INTRAMUSCULAR | Status: AC
  Filled 2013-12-10: qty 2

## 2013-12-10 MED ORDER — POTASSIUM CHLORIDE 20 MEQ/15ML (10%) PO LIQD
20.0000 meq | Freq: Two times a day (BID) | ORAL | Status: AC
  Filled 2013-12-10: qty 15

## 2013-12-10 MED ORDER — GLYCOPYRROLATE 0.2 MG/ML IJ SOLN
INTRAMUSCULAR | Status: DC | PRN
  Administered 2013-12-10: .8 mg via INTRAVENOUS

## 2013-12-10 MED ORDER — ROCURONIUM BROMIDE 50 MG/5ML IV SOLN
INTRAVENOUS | Status: AC
  Filled 2013-12-10: qty 1

## 2013-12-10 MED ORDER — SODIUM CHLORIDE 0.9 % IV SOLN
INTRAVENOUS | Status: DC
  Administered 2013-12-10 – 2013-12-12 (×4): via INTRAVENOUS

## 2013-12-10 MED ORDER — PHENYLEPHRINE HCL 10 MG/ML IJ SOLN
10.0000 mg | INTRAVENOUS | Status: DC | PRN
  Administered 2013-12-10: 50 ug/min via INTRAVENOUS

## 2013-12-10 MED ORDER — CHLORHEXIDINE GLUCONATE 0.12 % MT SOLN
15.0000 mL | Freq: Two times a day (BID) | OROMUCOSAL | Status: DC
  Administered 2013-12-10 – 2013-12-12 (×3): 15 mL via OROMUCOSAL
  Filled 2013-12-10 (×7): qty 15

## 2013-12-10 MED ORDER — POTASSIUM PHOSPHATES 15 MMOLE/5ML IV SOLN
30.0000 mmol | Freq: Once | INTRAVENOUS | Status: AC
  Administered 2013-12-10: 30 mmol via INTRAVENOUS
  Filled 2013-12-10: qty 10

## 2013-12-10 MED ORDER — LIDOCAINE-EPINEPHRINE 1 %-1:100000 IJ SOLN
INTRAMUSCULAR | Status: DC | PRN
  Administered 2013-12-10: 20 mL via INTRADERMAL

## 2013-12-10 MED ORDER — MIDAZOLAM HCL 2 MG/2ML IJ SOLN
INTRAMUSCULAR | Status: AC
  Filled 2013-12-10: qty 2

## 2013-12-10 MED ORDER — PROPOFOL 10 MG/ML IV BOLUS
INTRAVENOUS | Status: AC
Start: 2013-12-10 — End: 2013-12-10
  Filled 2013-12-10: qty 20

## 2013-12-10 MED ORDER — HEMOSTATIC AGENTS (NO CHARGE) OPTIME
TOPICAL | Status: DC | PRN
  Administered 2013-12-10: 1 via TOPICAL

## 2013-12-10 MED ORDER — BIOTENE DRY MOUTH MT LIQD
15.0000 mL | Freq: Four times a day (QID) | OROMUCOSAL | Status: DC
  Administered 2013-12-11 – 2013-12-14 (×15): 15 mL via OROMUCOSAL

## 2013-12-10 MED ORDER — LIDOCAINE HCL (CARDIAC) 20 MG/ML IV SOLN
INTRAVENOUS | Status: AC
  Filled 2013-12-10: qty 5

## 2013-12-10 MED ORDER — FENTANYL CITRATE 0.05 MG/ML IJ SOLN
INTRAMUSCULAR | Status: DC | PRN
  Administered 2013-12-10 (×2): 100 ug via INTRAVENOUS

## 2013-12-10 MED ORDER — ARTIFICIAL TEARS OP OINT
TOPICAL_OINTMENT | OPHTHALMIC | Status: DC | PRN
  Administered 2013-12-10: 1 via OPHTHALMIC

## 2013-12-10 MED ORDER — ARTIFICIAL TEARS OP OINT
TOPICAL_OINTMENT | OPHTHALMIC | Status: AC
  Filled 2013-12-10: qty 3.5

## 2013-12-10 MED ORDER — LACTATED RINGERS IV SOLN
INTRAVENOUS | Status: DC | PRN
  Administered 2013-12-10 (×3): via INTRAVENOUS

## 2013-12-10 MED ORDER — ONDANSETRON HCL 4 MG/2ML IJ SOLN
INTRAMUSCULAR | Status: DC | PRN
Start: 2013-12-10 — End: 2013-12-10
  Administered 2013-12-10: 4 mg via INTRAVENOUS

## 2013-12-10 MED ORDER — THROMBIN 5000 UNITS EX SOLR
OROMUCOSAL | Status: DC | PRN
  Administered 2013-12-10: 19:00:00 via TOPICAL

## 2013-12-10 MED ORDER — FUROSEMIDE 40 MG PO TABS
40.0000 mg | ORAL_TABLET | Freq: Every day | ORAL | Status: DC
  Administered 2013-12-12 – 2013-12-14 (×3): 40 mg
  Filled 2013-12-10 (×4): qty 1

## 2013-12-10 MED ORDER — LEVOFLOXACIN IN D5W 750 MG/150ML IV SOLN
750.0000 mg | INTRAVENOUS | Status: AC
  Administered 2013-12-10: 750 mg via INTRAVENOUS
  Filled 2013-12-10: qty 150

## 2013-12-10 MED ORDER — NEOSTIGMINE METHYLSULFATE 10 MG/10ML IV SOLN
INTRAVENOUS | Status: DC | PRN
  Administered 2013-12-10: 5 mg via INTRAVENOUS

## 2013-12-10 MED ORDER — 0.9 % SODIUM CHLORIDE (POUR BTL) OPTIME
TOPICAL | Status: DC | PRN
  Administered 2013-12-10 (×2): 1000 mL

## 2013-12-10 MED ORDER — OXYMETAZOLINE HCL 0.05 % NA SOLN
NASAL | Status: DC | PRN
  Administered 2013-12-10: 1 via NASAL

## 2013-12-10 MED ORDER — MIDAZOLAM HCL 5 MG/5ML IJ SOLN
INTRAMUSCULAR | Status: DC | PRN
  Administered 2013-12-10: 2 mg via INTRAVENOUS

## 2013-12-10 MED ORDER — VECURONIUM BROMIDE 10 MG IV SOLR
INTRAVENOUS | Status: AC
  Filled 2013-12-10: qty 10

## 2013-12-10 MED ORDER — HYDROMORPHONE HCL PF 1 MG/ML IJ SOLN: 0.2500 mg | INTRAMUSCULAR | Status: DC | PRN

## 2013-12-10 MED ORDER — ADULT MULTIVITAMIN W/MINERALS CH
1.0000 | ORAL_TABLET | Freq: Every day | ORAL | Status: DC
  Administered 2013-12-12 – 2013-12-14 (×3): 1
  Filled 2013-12-10 (×4): qty 1

## 2013-12-10 MED ORDER — PROPOFOL 10 MG/ML IV EMUL
0.0000 ug/kg/min | INTRAVENOUS | Status: DC
  Administered 2013-12-10: 5 ug/kg/min via INTRAVENOUS
  Administered 2013-12-11: 15 ug/kg/min via INTRAVENOUS
  Filled 2013-12-10 (×2): qty 100

## 2013-12-10 MED ORDER — LORAZEPAM 2 MG/ML IJ SOLN
0.5000 mg | Freq: Once | INTRAMUSCULAR | Status: AC
  Administered 2013-12-10: 0.5 mg via INTRAVENOUS
  Filled 2013-12-10: qty 1

## 2013-12-10 MED ORDER — PROPOFOL 10 MG/ML IV BOLUS
INTRAVENOUS | Status: DC | PRN
  Administered 2013-12-10: 140 mg via INTRAVENOUS
  Administered 2013-12-10: 200 mg via INTRAVENOUS

## 2013-12-10 MED ORDER — LIDOCAINE HCL (CARDIAC) 20 MG/ML IV SOLN
INTRAVENOUS | Status: DC | PRN
Start: 2013-12-10 — End: 2013-12-10
  Administered 2013-12-10: 100 mg via INTRAVENOUS

## 2013-12-10 MED ORDER — STERILE WATER FOR INJECTION IJ SOLN
INTRAMUSCULAR | Status: AC
  Filled 2013-12-10: qty 10

## 2013-12-10 MED ORDER — VECURONIUM BROMIDE 10 MG IV SOLR
INTRAVENOUS | Status: DC | PRN
  Administered 2013-12-10 (×3): 2 mg via INTRAVENOUS

## 2013-12-10 MED ORDER — GLYCOPYRROLATE 0.2 MG/ML IJ SOLN
INTRAMUSCULAR | Status: AC
  Filled 2013-12-10: qty 4

## 2013-12-10 MED ORDER — PANTOPRAZOLE SODIUM 40 MG IV SOLR
40.0000 mg | INTRAVENOUS | Status: DC
  Administered 2013-12-10 – 2013-12-12 (×3): 40 mg via INTRAVENOUS
  Filled 2013-12-10 (×6): qty 40

## 2013-12-10 MED ORDER — ONDANSETRON HCL 4 MG/2ML IJ SOLN: 4.0000 mg | Freq: Once | INTRAMUSCULAR | Status: DC | PRN

## 2013-12-10 MED ORDER — ROCURONIUM BROMIDE 100 MG/10ML IV SOLN
INTRAVENOUS | Status: DC | PRN
  Administered 2013-12-10: 50 mg via INTRAVENOUS
  Administered 2013-12-10 (×2): 25 mg via INTRAVENOUS

## 2013-12-10 MED ORDER — SUCCINYLCHOLINE CHLORIDE 20 MG/ML IJ SOLN
INTRAMUSCULAR | Status: DC | PRN
  Administered 2013-12-10: 140 mg via INTRAVENOUS

## 2013-12-10 MED ORDER — PROPOFOL 10 MG/ML IV BOLUS
INTRAVENOUS | Status: AC
  Filled 2013-12-10: qty 20

## 2013-12-10 MED ORDER — NEOSTIGMINE METHYLSULFATE 10 MG/10ML IV SOLN
INTRAVENOUS | Status: AC
  Filled 2013-12-10: qty 1

## 2013-12-10 MED ORDER — FENTANYL CITRATE 0.05 MG/ML IJ SOLN
100.0000 ug | INTRAMUSCULAR | Status: DC | PRN
  Administered 2013-12-10: 100 ug via INTRAVENOUS
  Filled 2013-12-10: qty 2

## 2013-12-10 MED ORDER — FENTANYL CITRATE 0.05 MG/ML IJ SOLN
INTRAMUSCULAR | Status: AC
  Filled 2013-12-10: qty 5

## 2013-12-10 SURGICAL SUPPLY — 116 items
ATTRACTOMAT 16X20 MAGNETIC DRP (DRAPES) ×3 IMPLANT
BAG DECANTER FOR FLEXI CONT (MISCELLANEOUS) IMPLANT
BENZOIN TINCTURE PRP APPL 2/3 (GAUZE/BANDAGES/DRESSINGS) IMPLANT
BLADE 10 SAFETY STRL DISP (BLADE) ×6 IMPLANT
BLADE EAR TYMPAN 2.5 STR BEAV (BLADE) ×3 IMPLANT
BLADE SURG 10 STRL SS (BLADE) ×3 IMPLANT
BLADE SURG 11 STRL SS (BLADE) ×3 IMPLANT
BLADE SURG 15 STRL LF DISP TIS (BLADE) ×2 IMPLANT
BLADE SURG 15 STRL SS (BLADE) ×4
CANISTER SUCT 3000ML (MISCELLANEOUS) ×6 IMPLANT
CATH ROBINSON RED A/P 14FR (CATHETERS) IMPLANT
CATH VENTRICULAR 14CMX1.4MM (INSTRUMENTS) ×3 IMPLANT
CLEANER TIP ELECTROSURG 2X2 (MISCELLANEOUS) IMPLANT
CLOSURE WOUND 1/2 X4 (GAUZE/BANDAGES/DRESSINGS)
CLOSURE WOUND 1/4X4 (GAUZE/BANDAGES/DRESSINGS)
COAGULATOR SUCT 8FR VV (MISCELLANEOUS) IMPLANT
CONT SPEC 4OZ CLIKSEAL STRL BL (MISCELLANEOUS) ×6 IMPLANT
CORDS BIPOLAR (ELECTRODE) ×3 IMPLANT
COVER MAYO STAND STRL (DRAPES) ×6 IMPLANT
DECANTER SPIKE VIAL GLASS SM (MISCELLANEOUS) IMPLANT
DEPRESSOR TONGUE BLADE STERILE (MISCELLANEOUS) ×3 IMPLANT
DRAIN SUBARACHNOID (WOUND CARE) IMPLANT
DRAPE C-ARM 42X72 X-RAY (DRAPES) ×3 IMPLANT
DRAPE EENT ADH APERT 15X15 STR (DRAPES) ×3 IMPLANT
DRAPE INCISE IOBAN 66X45 STRL (DRAPES) ×3 IMPLANT
DRAPE MICROSCOPE LEICA (MISCELLANEOUS) ×3 IMPLANT
DRAPE POUCH INSTRU U-SHP 10X18 (DRAPES) ×3 IMPLANT
DRAPE PROXIMA HALF (DRAPES) ×6 IMPLANT
DRESSING NASAL POPE 10X1.5X2.5 (GAUZE/BANDAGES/DRESSINGS) ×2 IMPLANT
DRSG NASAL POPE 10X1.5X2.5 (GAUZE/BANDAGES/DRESSINGS) ×6
DRSG TELFA 3X8 NADH (GAUZE/BANDAGES/DRESSINGS) ×9 IMPLANT
DURAPREP 26ML APPLICATOR (WOUND CARE) ×3 IMPLANT
DURASEAL APPLICATOR TIP (TIP) ×3 IMPLANT
DURASEAL SPINE SEALANT 3ML (MISCELLANEOUS) ×3 IMPLANT
ELECT CAUTERY BLADE 6.4 (BLADE) ×3 IMPLANT
ELECT COATED BLADE 2.86 ST (ELECTRODE) ×3 IMPLANT
ELECT NEEDLE TIP 2.8 STRL (NEEDLE) ×3 IMPLANT
ELECT REM PT RETURN 9FT ADLT (ELECTROSURGICAL) ×3
ELECTRODE REM PT RTRN 9FT ADLT (ELECTROSURGICAL) ×1 IMPLANT
GAUZE PACKING FOLDED 1IN STRL (GAUZE/BANDAGES/DRESSINGS) ×3 IMPLANT
GAUZE PACKING FOLDED 2  STR (GAUZE/BANDAGES/DRESSINGS) ×2
GAUZE PACKING FOLDED 2 STR (GAUZE/BANDAGES/DRESSINGS) ×1 IMPLANT
GAUZE SPONGE 2X2 8PLY STRL LF (GAUZE/BANDAGES/DRESSINGS) ×1 IMPLANT
GLOVE BIOGEL M 8.0 STRL (GLOVE) ×3 IMPLANT
GLOVE EXAM NITRILE LRG STRL (GLOVE) IMPLANT
GLOVE EXAM NITRILE MD LF STRL (GLOVE) IMPLANT
GLOVE EXAM NITRILE XL STR (GLOVE) IMPLANT
GLOVE EXAM NITRILE XS STR PU (GLOVE) IMPLANT
GLOVE SS BIOGEL STRL SZ 7.5 (GLOVE) ×1 IMPLANT
GLOVE SUPERSENSE BIOGEL SZ 7.5 (GLOVE) ×2
GOWN PREVENTION PLUS XLARGE (GOWN DISPOSABLE) ×3 IMPLANT
GOWN STRL NON-REIN LRG LVL3 (GOWN DISPOSABLE) ×3 IMPLANT
GOWN STRL REUS W/ TWL LRG LVL3 (GOWN DISPOSABLE) ×1 IMPLANT
GOWN STRL REUS W/ TWL XL LVL3 (GOWN DISPOSABLE) IMPLANT
GOWN STRL REUS W/TWL 2XL LVL3 (GOWN DISPOSABLE) IMPLANT
GOWN STRL REUS W/TWL LRG LVL3 (GOWN DISPOSABLE) ×2
GOWN STRL REUS W/TWL XL LVL3 (GOWN DISPOSABLE)
GRAFT DURAGEN MATRIX 1WX1L (Tissue) ×3 IMPLANT
HEMOSTAT POWDER KIT SURGIFOAM (HEMOSTASIS) ×3 IMPLANT
HEMOSTAT SURGICEL 2X14 (HEMOSTASIS) ×3 IMPLANT
KIT BASIN OR (CUSTOM PROCEDURE TRAY) ×6 IMPLANT
KIT ROOM TURNOVER OR (KITS) ×6 IMPLANT
MARKER SKIN DUAL TIP RULER LAB (MISCELLANEOUS) ×3 IMPLANT
NEEDLE 27GAX1X1/2 (NEEDLE) ×3 IMPLANT
NEEDLE HYPO 25X1 1.5 SAFETY (NEEDLE) ×3 IMPLANT
NEEDLE SPNL 22GX3.5 QUINCKE BK (NEEDLE) IMPLANT
NS IRRIG 1000ML POUR BTL (IV SOLUTION) ×6 IMPLANT
PAD ARMBOARD 7.5X6 YLW CONV (MISCELLANEOUS) ×9 IMPLANT
PATTIES SURGICAL .25X.25 (GAUZE/BANDAGES/DRESSINGS) IMPLANT
PATTIES SURGICAL .5 X.5 (GAUZE/BANDAGES/DRESSINGS) IMPLANT
PATTIES SURGICAL .5 X3 (DISPOSABLE) IMPLANT
PENCIL BUTTON HOLSTER BLD 10FT (ELECTRODE) ×3 IMPLANT
PENCIL FOOT CONTROL (ELECTRODE) IMPLANT
PIN MAYFIELD SKULL DISP (PIN) ×3 IMPLANT
RUBBERBAND STERILE (MISCELLANEOUS) ×6 IMPLANT
SEALER BIPOLAR AQUA 5.0 SHEATH (INSTRUMENTS) ×3 IMPLANT
SET COLLECT BLD 25X3/4 12 (NEEDLE) IMPLANT
SHEET SIL 040 (INSTRUMENTS) IMPLANT
SLEEVE SURGEON STRL (DRAPES) ×3 IMPLANT
SPLINT NASAL DOYLE BI-VL (GAUZE/BANDAGES/DRESSINGS) ×3 IMPLANT
SPONGE GAUZE 2X2 STER 10/PKG (GAUZE/BANDAGES/DRESSINGS) ×2
SPONGE GAUZE 4X4 12PLY (GAUZE/BANDAGES/DRESSINGS) ×3 IMPLANT
SPONGE LAP 4X18 X RAY DECT (DISPOSABLE) ×3 IMPLANT
SPONGE NEURO XRAY DETECT 1X3 (DISPOSABLE) IMPLANT
SPONGE SURGIFOAM ABS GEL SZ50 (HEMOSTASIS) IMPLANT
STAPLER SKIN PROX WIDE 3.9 (STAPLE) IMPLANT
STRIP CLOSURE SKIN 1/2X4 (GAUZE/BANDAGES/DRESSINGS) IMPLANT
STRIP CLOSURE SKIN 1/4X4 (GAUZE/BANDAGES/DRESSINGS) IMPLANT
SUT BONE WAX W31G (SUTURE) ×3 IMPLANT
SUT CHROMIC 3 0 PS 2 (SUTURE) ×3 IMPLANT
SUT CHROMIC 4 0 P 3 18 (SUTURE) ×3 IMPLANT
SUT CHROMIC 4 0 PS 2 18 (SUTURE) IMPLANT
SUT ETHILON 3 0 PS 1 (SUTURE) IMPLANT
SUT ETHILON 4 0 PS 2 18 (SUTURE) ×3 IMPLANT
SUT ETHILON 6 0 P 1 (SUTURE) IMPLANT
SUT NOVAFIL 6 0 PRE 2 4412 13 (SUTURE) IMPLANT
SUT PLAIN 4 0 ~~LOC~~ 1 (SUTURE) IMPLANT
SUT PROLENE 6 0 BV (SUTURE) IMPLANT
SUT SILK 2 0 FS (SUTURE) IMPLANT
SUT VIC AB 2-0 CP2 18 (SUTURE) IMPLANT
SUT VIC AB 2-0 CT1 27 (SUTURE)
SUT VIC AB 2-0 CT1 27XBRD (SUTURE) IMPLANT
SUT VIC AB 3-0 SH 8-18 (SUTURE) ×3 IMPLANT
SUT VIC AB 4-0 P-3 18X BRD (SUTURE) IMPLANT
SUT VIC AB 4-0 P3 18 (SUTURE)
SYR 5ML LL (SYRINGE) ×3 IMPLANT
TOWEL OR 17X24 6PK STRL BLUE (TOWEL DISPOSABLE) ×3 IMPLANT
TOWEL OR 17X26 10 PK STRL BLUE (TOWEL DISPOSABLE) ×3 IMPLANT
TRAP SPECIMEN MUCOUS 40CC (MISCELLANEOUS) ×3 IMPLANT
TRAY ENT MC OR (CUSTOM PROCEDURE TRAY) ×6 IMPLANT
TRAY FOLEY CATH 14FRSI W/METER (CATHETERS) IMPLANT
TRAY FOLEY CATH 16FRSI W/METER (SET/KITS/TRAYS/PACK) ×3 IMPLANT
TUBE CONNECTING 12'X1/4 (SUCTIONS) ×1
TUBE CONNECTING 12X1/4 (SUCTIONS) ×2 IMPLANT
UNDERPAD 30X30 INCONTINENT (UNDERPADS AND DIAPERS) ×3 IMPLANT
WATER STERILE IRR 1000ML POUR (IV SOLUTION) ×3 IMPLANT

## 2013-12-10 NOTE — Anesthesia Preprocedure Evaluation (Addendum)
Anesthesia Evaluation  Patient identified by MRN, date of birth, ID band Patient awake    Reviewed: Allergy & Precautions, H&P , NPO status , Patient's Chart, lab work & pertinent test results  Airway Mallampati: II TM Distance: <3 FB Neck ROM: Full    Dental  (+) Teeth Intact, Dental Advisory Given   Pulmonary sleep apnea ,          Cardiovascular hypertension, +CHF     Neuro/Psych  Neuromuscular disease    GI/Hepatic   Endo/Other  Morbid obesity  Renal/GU Renal disease     Musculoskeletal   Abdominal   Peds  Hematology   Anesthesia Other Findings Pituitary Tumor  Reproductive/Obstetrics                         Anesthesia Physical Anesthesia Plan  ASA: III  Anesthesia Plan: General   Post-op Pain Management:    Induction: Intravenous  Airway Management Planned: Oral ETT  Additional Equipment:   Intra-op Plan:   Post-operative Plan: Possible Post-op intubation/ventilation and Extubation in OR  Informed Consent: I have reviewed the patients History and Physical, chart, labs and discussed the procedure including the risks, benefits and alternatives for the proposed anesthesia with the patient or authorized representative who has indicated his/her understanding and acceptance.     Plan Discussed with:   Anesthesia Plan Comments:         Anesthesia Quick Evaluation

## 2013-12-10 NOTE — Consult Note (Signed)
Imogene Burn. Georgette Dover, MD, [facility]  General/ Trauma Surgery  12/10/2013 4:14 PM

## 2013-12-10 NOTE — Consult Note (Signed)
[name] Pian, NP Nurse Practitioner In Progress Surgery Consult Note Service date: 12/09/2013 10:40 AM  Reason for Consult: RLQ abdominal wall wound Referring Physician: Dr. Jennet Maduro    HPI: [name] [name] is a 5 male with a history of OSA, dCHF, HTN, laparoscopic sigmoid colectomy, incisional hernia repair and the removal of hernia mesh for infection in 2008 and chronic RLQ abdominal wound.  He underwent a  I&D 06/06/13 and 06/09/13 by Dr. Arta Bruce in [place].  At this time the wound was  1.8x18x14cm in size.  He was treated with Cubicin 88 days for proteus mirabilis, He has since been followed by the wound center at [facility].  He has had a wound VAC since then.    He presented to [facility] with headaches and blurred vision.  He was found to have a suprasellar mass and was admitted to the ICU.  Neurosurgery was consulted who recommended surgery, however, wanted evaluation of abdominal wall wound for infection prior to proceeding with surgery.  No leukocytosis since admission.  He is afebrile.  VSS.  He is on diflucan for surrounding yeast.  VAC changes MWF.      Past Medical History   Diagnosis  Date   .  Sleep apnea     .  Allergic rhinitis     .  Diastolic CHF     .  Gout     .  Nephrolithiasis     .  Peripheral neuropathy     .  Hypertension         Past Surgical History   Procedure  Laterality  Date   .  Sigmoid colonoscopy       .  Hernia repair       .  Abdominal surgery           for cyst with MRSA       Family History   Problem  Relation  Age of Onset   .  Hypertension  Mother     .  Hypertension  Father     .  Gout  Father     .  Hypothyroidism  Father     .  Gout  Brother        Social History: reports that he has never smoked. He has never used smokeless tobacco. He reports that he does not drink alcohol or use illicit drugs.   Allergies:   Allergies   Allergen  Reactions   .  Penicillins  Anaphylaxis and Other (See Comments)       Unknown  reaction   .  Morphine And Related  Hives and Itching      Medications:   Scheduled Meds: .  amLODipine   5 mg  Oral  Daily   .  antiseptic oral rinse   15 mL  Mouth Rinse  BID   .  buPROPion   300 mg  Oral  Daily   .  carvedilol   12.5 mg  Oral  BID WC   .  Chlorhexidine Gluconate Cloth   6 each  Topical  Q0600   .  furosemide   40 mg  Oral  Daily   .  gabapentin   900 mg  Oral  QHS   .  heparin   5,000 Units  Subcutaneous  3 times per day   .  multivitamin with minerals   1 tablet  Oral  Daily   .  mupirocin ointment   1 application  Nasal  BID   .  pantoprazole (PROTONIX) IV   40 mg  Intravenous  Q24H   .  potassium chloride SA   20 mEq  Oral  BID   .  sodium chloride   10-40 mL  Intracatheter  Q12H    Continuous Infusions:  PRN Meds:.sodium chloride, acetaminophen, fentaNYL, HYDROcodone-acetaminophen, lidocaine, ondansetron (ZOFRAN) IV, sodium chloride      Results for orders placed during the hospital encounter of 12/06/13 (from the past 48 hour(s))   BASIC METABOLIC PANEL     Status: Abnormal     Collection Time      12/08/13  3:10 AM       Result  Value  Ref Range     Sodium  138   137 - 147 mEq/L     Potassium  4.3   3.7 - 5.3 mEq/L     Chloride  97   96 - 112 mEq/L     CO2  35 (*)  19 - 32 mEq/L     Glucose, Bld  119 (*)  70 - 99 mg/dL     BUN  17   6 - 23 mg/dL     Creatinine, Ser  0.87   0.50 - 1.35 mg/dL     Calcium  8.8   8.4 - 10.5 mg/dL     GFR calc non Af Amer  >90   >90 mL/min     GFR calc Af Amer  >90   >90 mL/min     Comment:  (NOTE)        The eGFR has been calculated using the CKD EPI equation.        This calculation has not been validated in all clinical situations.        eGFR's persistently <90 mL/min signify possible Chronic Kidney        Disease.     Anion gap  6   5 - 15   CBC WITH DIFFERENTIAL     Status: Abnormal     Collection Time      12/08/13  3:10 AM       Result  Value  Ref Range     WBC  8.0   4.0 - 10.5 K/uL     RBC  4.71    4.22 - 5.81 MIL/uL     Hemoglobin  12.8 (*)  13.0 - 17.0 g/dL     HCT  42.8   39.0 - 52.0 %     MCV  90.9   78.0 - 100.0 fL     MCH  27.2   26.0 - 34.0 pg     MCHC  29.9 (*)  30.0 - 36.0 g/dL     RDW  17.8 (*)  11.5 - 15.5 %     Platelets  131 (*)  150 - 400 K/uL     Neutrophils Relative %  84 (*)  43 - 77 %     Neutro Abs  6.7   1.7 - 7.7 K/uL     Lymphocytes Relative  8 (*)  12 - 46 %     Lymphs Abs  0.6 (*)  0.7 - 4.0 K/uL     Monocytes Relative  8   3 - 12 %     Monocytes Absolute  0.7   0.1 - 1.0 K/uL     Eosinophils Relative  0   0 - 5 %     Eosinophils Absolute  0.0   0.0 -  0.7 K/uL     Basophils Relative  0   0 - 1 %     Basophils Absolute  0.0   0.0 - 0.1 K/uL   CBC     Status: Abnormal     Collection Time      12/09/13  7:00 AM       Result  Value  Ref Range     WBC  7.0   4.0 - 10.5 K/uL     RBC  4.74   4.22 - 5.81 MIL/uL     Hemoglobin  12.7 (*)  13.0 - 17.0 g/dL     HCT  42.6   39.0 - 52.0 %     MCV  89.9   78.0 - 100.0 fL     MCH  26.8   26.0 - 34.0 pg     MCHC  29.8 (*)  30.0 - 36.0 g/dL     RDW  17.2 (*)  11.5 - 15.5 %     Platelets  120 (*)  150 - 400 K/uL   BASIC METABOLIC PANEL     Status: Abnormal     Collection Time      12/09/13  7:00 AM       Result  Value  Ref Range     Sodium  140   137 - 147 mEq/L     Potassium  4.4   3.7 - 5.3 mEq/L     Chloride  95 (*)  96 - 112 mEq/L     CO2  39 (*)  19 - 32 mEq/L     Glucose, Bld  92   70 - 99 mg/dL     BUN  15   6 - 23 mg/dL     Creatinine, Ser  0.82   0.50 - 1.35 mg/dL     Calcium  9.0   8.4 - 10.5 mg/dL     GFR calc non Af Amer  >90   >90 mL/min     GFR calc Af Amer  >90   >90 mL/min     Comment:  (NOTE)        The eGFR has been calculated using the CKD EPI equation.        This calculation has not been validated in all clinical situations.        eGFR's persistently <90 mL/min signify possible Chronic Kidney        Disease.     Anion gap  6   5 - 15   MAGNESIUM     Status: None     Collection Time       12/09/13  7:00 AM       Result  Value  Ref Range     Magnesium  2.1   1.5 - 2.5 mg/dL   PHOSPHORUS     Status: Abnormal     Collection Time      12/09/13  7:00 AM       Result  Value  Ref Range     Phosphorus  2.0 (*)  2.3 - 4.6 mg/dL      [name]   12/07/2013   CLINICAL DATA:  pituitary tumor  EXAM: MRI HEAD WITHOUT AND WITH CONTRAST  TECHNIQUE: Multiplanar, multiecho pulse sequences of the brain and surrounding structures were obtained without and with intravenous contrast.  CONTRAST:  70m MULTIHANCE GADOBENATE DIMEGLUMINE 529 MG/ML IV SOLN COMPARISON:  Head CT 12/06/2013  FINDINGS: The study is  degraded by considerable motion. Patient's body habitus prevents use of the best coil configuration.  Diffusion imaging does not show any acute or subacute infarction. There are mild chronic appearing small vessel changes of the cerebral hemispheric white matter. No cortical insult. No intra-axial mass, hemorrhage, hydrocephalus or extra-axial fluid collection.  There is an invasive mass involving the sella, clivus, sphenoid sinus and suprasellar region. This measures 3.7 cm cephalocaudal, 4.1 cm front to back, and 3.4 cm right to left. There is invasion of the cavernous sinus on the right, surrounding the carotid artery. Early invasion on the left is possible. The component extending into the suprasellar region measures 14 mm in diameter and indents the undersurface of the optic chiasm. The lesion shows variable enhancement. I do not see cystic change or evidence of calcification by MR.  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 Differential diagnosis does include metastatic disease, chordoma and craniopharyngioma.  IMPRESSION: 3.7 x 4.1 x 3.4 cm mass with the epicenter in the region of the sella/clivus. There is invasion of the bony clivus, invasion of the sphenoid sinus and extension into the suprasellar region. There is definite cavernous sinus invasion on  the right and possible or early cavernous sinus invasion on the left. 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 The differential diagnosis does include metastatic disease, chordoma and craniopharyngioma.   Electronically Signed   By: Nelson Chimes M.D.   On: 12/07/2013 16:10    Dg Chest Port 1 View   12/09/2013   CLINICAL DATA:  Shortness of Breath  EXAM: PORTABLE CHEST - 1 VIEW  COMPARISON:  November 08, 2013  FINDINGS: Central catheter tip is at the cavoatrial junction. No pneumothorax. There is mild right base atelectatic change. Lungs are otherwise clear. Heart is mildly enlarged but stable. The pulmonary vascularity is within normal limits. No adenopathy.  IMPRESSION: No frank edema or consolidation. Mild right base atelectasis. Stable cardiac enlargement. No pneumothorax.   Electronically Signed  By: Lowella Grip M.D.   On: 12/09/2013 07:32    Dg Chest Port 1 View   12/08/2013   CLINICAL DATA:  PICC line placement.  EXAM: PORTABLE CHEST - 1 VIEW COMPARISON:  12/07/2013  FINDINGS: Interval placement of a right PICC catheter with tip over the low SVC region. No pneumothorax. Shallow inspiration with elevation of the right hemidiaphragm. Atelectasis in the lung bases. Cardiac enlargement without significant vascular congestion.  IMPRESSION: Right PICC catheter tip overlies the lower SVC.  No pneumothorax.   Electronically Signed   By: Lucienne Capers M.D.   On: 12/08/2013 21:39     Review of Systems  All other systems reviewed and are negative. Blood pressure 166/95, pulse 75, temperature 98.4 F (36.9 C), temperature source Axillary, resp. rate 13, height 5' 11"  (1.803 m), weight 466 lb (211.376 kg), SpO2 99.00%. Physical Exam  Constitutional: He is oriented to person, place, and time. He appears well-developed and well-nourished. No distress.  Cardiovascular: Normal rate, regular rhythm, normal heart sounds and intact distal pulses.  Exam reveals no gallop and no friction rub.     No murmur heard. Respiratory: Effort normal and breath sounds normal. No respiratory distress. He has no wheezes. He has no rales. He exhibits no tenderness.  GI: Soft. Bowel sounds are normal. He exhibits no distension. There is no tenderness.  Depth 9cm Length 6.5cm Width 2.5cm Tunnels-11cm 2-3 oclock  Beefy red, no purulent drainage.  Musculoskeletal: Normal range of motion. He exhibits no edema and  no tenderness.  Neurological: He is alert and oriented to person, place, and time.  Skin: He is not diaphoretic.  Psychiatric: He has a normal mood and affect. His behavior is normal. Judgment and thought content normal.     Assessment/Plan:  Right abdominal wall wound: the wound is clean and no signs of active infection at this time. Continue with wound VAC and outpatient follow up at the wound center. May proceed with surgery per General Surgery standpoint. Pre-operative antibiotics per ID. Thank you for the consult. Surgery singing off, please do not hesitate to contact [facility] with any questions or concerns.    [name] [name] ANP-BC  Pager [phone]  12/09/2013, 10:40 AM

## 2013-12-10 NOTE — Progress Notes (Signed)
Patient ID: [name] [name], male   DOB: September 10, 1965, 48 y.o.   MRN: [id number] To or this pm. Mother aware of risks

## 2013-12-10 NOTE — Anesthesia Procedure Notes (Signed)
Procedure Name: Intubation Date/Time: 12/10/2013 4:55 PM Performed by: Sampson Si E Pre-anesthesia Checklist: Patient identified, Emergency Drugs available, Suction available and Patient being monitored Patient Re-evaluated:Patient Re-evaluated prior to inductionOxygen Delivery Method: Circle system utilized Preoxygenation: Pre-oxygenation with 100% oxygen Intubation Type: IV induction and Rapid sequence Ventilation: Mask ventilation with difficulty and Two handed mask ventilation required Laryngoscope Size: Mac and 3 Grade View: Grade I Tube type: Oral Tube size: 7.5 mm Number of attempts: 1 Airway Equipment and Method: Stylet Placement Confirmation: ETT inserted through vocal cords under direct vision,  positive ETCO2 and breath sounds checked- equal and bilateral Secured at: 24 cm Tube secured with: Tape Dental Injury: Teeth and Oropharynx as per pre-operative assessment

## 2013-12-10 NOTE — Brief Op Note (Signed)
12/06/2013 - 12/10/2013  7:58 PM  PATIENT:  [name] [name]  48 y.o. male  PRE-OPERATIVE DIAGNOSIS:  Pituitary tumor  POST-OPERATIVE DIAGNOSIS:  Pituitary tumor  PROCEDURE:  Procedure(s) with comments: Transpheniodal resection of Pituitary tumor with Dr. Radene Journey for approach (N/A) - Transpheniodal resection of Pituitary tumor with Dr. Radene Journey for approach TRANSNASAL APPROACH (N/A)  SURGEON:  Surgeon(s) and Role: Panel 1:    * Floyce Stakes, MD - Primary  Panel 2:    * Rozetta Nunnery, MD - Primary  PHYSICIAN ASSISTANT:   ASSISTANTS: none   ANESTHESIA:   general  EBL:  Total I/O In: 1000 [I.V.:1000] Out: 100 [Blood:100]  BLOOD ADMINISTERED:none  DRAINS: none   LOCAL MEDICATIONS USED:  LIDOCAINE with EPI  12cc  SPECIMEN:  Source of Specimen:  pituitary  DISPOSITION OF SPECIMEN:  PATHOLOGY  COUNTS:  YES  TOURNIQUET:  * No tourniquets in log *  DICTATION: .Other Dictation: Dictation Number [id number]  PLAN OF CARE: Admit to inpatient   PATIENT DISPOSITION:  PACU - hemodynamically stable.   Delay start of Pharmacological VTE agent (>24hrs) due to surgical blood loss or risk of bleeding: yes

## 2013-12-10 NOTE — Progress Notes (Signed)
PULMONARY / CRITICAL CARE MEDICINE   Name: [name] [name] MRN: [id number] DOB: 03/10/1966    ADMISSION DATE:  12/06/2013  REFERRING MD :  Dr. Doy Mince PRIMARY SERVICE: PCCM   CHIEF COMPLAINT:  Hypercarbic Respiratory Failure   BRIEF PATIENT DESCRIPTION: 48 y/o M with a PMH of HTN, dCHF, OSA who presented to [facility] ER with 2 day hx of HA, blurry vision, & 1 episode of dark emesis (? If blood).  CT evaluation noted sellar & suprasellar mass lesion.  Developed hypercarbic respiratory failure in ER requiring bipap.  On 7/8, taken to OR for transphenoidal resection of pituitary tumor.  Returned to ICU on vent.  SIGNIFICANT EVENTS: 7/04 - Admit with HA, blurry vision, vomiting (dark emesis, ? blood). CT positive for suprasellar mass 7/5 BIPAP overnight 7/6 significant AMS, improving with BiPAP 7/8 OR >>> transphenoidal resection of pituitary tumor.  STUDIES:  7/04 - CT head >> prominent, large sellar & suprasellar mass lesion with extension into the R cavernous sinus, ssuperior extension beyond the diaphragm sella. Osseous destruction with extension into sphenoid sinus. 7/5 MRI Head >>> 3.7 x 4.1 x 3.4cm mass with epicenter in region of sella/clivus.  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  LINES / TUBES: R PICC line 7/6 >>> ETT 7/8 >>> OGT 7/8 >>> Abd wound vac 7/3 >>> Foley 7/8 >>>  CULTURES: Blood 7/7 >>>   ANTIBIOTICS: Diflucan (yeast RLQ wound) 7/4 >>> Vancomycin 7/7 >>> 7/8   SUBJECTIVE:  To OR in PM of 7/8 for transphenoidal resection of tumor.  Returned to ICU on vent.  VITAL SIGNS: Temp:  [97.8 F (36.6 C)-98 F (36.7 C)] 97.9 F (36.6 C) (07/08 1543) Pulse Rate:  [60-82] 70 (07/08 1600) Resp:  [11-27] 12 (07/08 1600) BP: (117-150)/(69-118) 146/92 mmHg (07/08 1600) SpO2:  [95 %-100 %] 97 % (07/08 1600) FiO2 (%):  [50 %] 50 % (07/08 2040) Weight:  [209.108 kg (461 lb)] 209.108 kg (461 lb) (07/08 0414) HEMODYNAMICS:   VENTILATOR SETTINGS: Vent Mode:   [-] PSV FiO2 (%):  [50 %] 50 % PEEP:  [5 cmH20] 5 cmH20 Pressure Support:  [12 cmH20] 12 cmH20  INTAKE / OUTPUT: Intake/Output     07/08 0701 - 07/09 0700   P.O.    I.V. (mL/kg) 1530 (7.3)   IV Piggyback 1010   Total Intake(mL/kg) 2540 (12.1)   Urine (mL/kg/hr) 3700 (1.2)   Blood 150 (0)   Total Output 3850   Net -1310        PHYSICAL EXAMINATION:  Gen: morbidly obese, on vent. HEENT: NCAT, right eye ptosis but pupils are reactive PULM: Distant BS throughout, on vent. CV: RRR, no mgr, no JVD AB: Wound vac RLQ.  Obese, BS hypoactive, soft, NT/ND. Ext: warm, + leg edema, no clubbing, no cyanosis Derm: R abdominal wound vac Neuro: Sedated but responds to voice, moves ext to command, right ptosis persists  LABS:  CBC  Recent Labs Lab 12/08/13 0310 12/09/13 0700 12/10/13 0425  WBC 8.0 7.0 5.7  HGB 12.8* 12.7* 12.2*  HCT 42.8 42.6 41.0  PLT 131* 120* 127*   Coag's No results found for this basename: APTT, INR,  in the last 168 hours BMET  Recent Labs Lab 12/08/13 0310 12/09/13 0700 12/10/13 0425  NA 138 140 141  K 4.3 4.4 3.7  CL 97 95* 95*  CO2 35* 39* 39*  BUN 17 15 16   CREATININE 0.87 0.82 0.84  GLUCOSE 119* 92 101*   Electrolytes  Recent Labs Lab  12/08/13 0310 12/09/13 0700 12/10/13 0425  CALCIUM 8.8 9.0 8.9  MG  --  2.1 2.1  PHOS  --  2.0* 2.0*   Sepsis Markers  Recent Labs Lab 12/06/13 0855  LATICACIDVEN 0.8   ABG  Recent Labs Lab 12/06/13 0758 12/06/13 1012  PHART 7.218* 7.256*  PCO2ART 78.5* 76.9*  PO2ART 82.0 118.0*   Liver Enzymes  Recent Labs Lab 12/06/13 0855  AST 23  ALT 35  ALKPHOS 100  BILITOT 0.4  ALBUMIN 3.9   Cardiac Enzymes No results found for this basename: TROPONINI, PROBNP,  in the last 168 hours Glucose  Recent Labs Lab 12/06/13 2023  GLUCAP 124*   Imaging  ASSESSMENT / PLAN:  NEUROLOGIC A:   Suprasellar Mass s/p transphenoidal resection 7/8 - TSH low, LH, FSH both low, ACTH  pending Headache / Pain  Ptosis of R Eye  Depression / Anxiety Peripheral Neuropathy  P:   - Per NSGY/Neurology. - Sedation: Propofol gtt / Fentanyl PRN. - D/c Norco / acetaminophen. - Daily WUA. - D/c outpatient wellbutrin, gabapentin for now. Can resume once extubated. - See endocrine for w/u.  PULMONARY A: VDRF s/p transphenoidal resection of pituitary tumor Acute on Chronic Hypercarbic Respiratory Failure > due to OSA / OHS P:   - Full vent support, wean as able. - ABG in 1 hour. - SBT in AM. - CXR in AM.  CARDIOVASCULAR A:  Hypertension  Bradycardia  dCHF  P:  - D/c outpatient Amlodipine, Carvedilol for now/while NPO. - Lasix per tube.  RENAL A:   No acute issues  P:   - NS @ 100. - Continue lasix 40 mg per tube. - Potassium per tube. - BMP in AM.  GASTROINTESTINAL A:   Nutrition Morbid Obesity  Hematemesis - 1 episode, on NSAID at baseline.  No further bleeding  At Risk Aspiration - with hypercarbic resp failure P:   - NPO. - PPI. - If remains intubated in AM, will likely need TF as essentially been on liquid diet for days now.  HEMATOLOGIC A:   Mild Thrombocytopenia  P:  - VTE prophylaxis:  SCD's. - CBC in AM.  INFECTIOUS A:   RLQ Chronic Wound VAC Yeast RLQ  P:   - Monitor fever curve / leukocytosis  - Diflucan for candida at wound site per ID's recommendations. - Continue topical anti-fungal. - WOC following for [facility] assessment & changes.  Changed at home M/W/F - Appreciate input from surgery and ID. - IV vanc (MRSA) and a dose of ancef (proteus) d/ced post op per ID's recommendations.   ENDOCRINE A:  Sellar & Suprasellar Mass s/p transphenoidal resection 7/8 Hyperglycemia  - no hx of DM. Cortisol normal - 21 P:   - Needs endocrine f/u post op. - SSI if needed for CBG > 180.     Montey Hora, [facility] Pgr: [phone]  or [phone]    Attending:  I have seen and  examined the patient with nurse practitioner/resident and agree with the note above.   Additional CC time: 30 min.  Roselie Awkward, MD [place] [facility] Pager: [phone] Cell: [phone] If no response, call [phone]

## 2013-12-10 NOTE — Transfer of Care (Signed)
Immediate Anesthesia Transfer of Care Note  Patient: [name] [name]  Procedure(s) Performed: Procedure(s) with comments: Transpheniodal resection of Pituitary tumor with Dr. Radene Journey for approach (N/A) - Transpheniodal resection of Pituitary tumor with Dr. Radene Journey for approach TRANSNASAL APPROACH (N/A)  Patient Location: NICU  Anesthesia Type:General  Level of Consciousness: Patient remains intubated per anesthesia plan  Airway & Oxygen Therapy: Patient remains intubated per anesthesia plan and Patient placed on Ventilator (see vital sign flow sheet for setting)  Post-op Assessment: Report given to PACU RN and Post -op Vital signs reviewed and stable  Post vital signs: Reviewed and stable  Complications: No apparent anesthesia complications

## 2013-12-10 NOTE — Progress Notes (Signed)
[facility]  Patient Status: Active (receiving services up to time of hospitalization)  [facility] is providing the following services: RN  If patient discharges after hours, please call [phone].   [name] [name] 12/10/2013, 5:56 PM

## 2013-12-10 NOTE — Progress Notes (Signed)
PULMONARY / CRITICAL CARE MEDICINE   Name: [name] [name] MRN: [id number] DOB: 10/07/1965    ADMISSION DATE:  12/06/2013  REFERRING MD :  Dr. Doy Mince PRIMARY SERVICE: PCCM   CHIEF COMPLAINT:  Hypercarbic Respiratory Failure   BRIEF PATIENT DESCRIPTION: 48 y/o M with a PMH of HTN, dCHF, OSA who presented to [facility] ER with 2 day hx of HA, blurry vision, & 1 episode of dark emesis (? If blood).  CT evaluation noted sellar & suprasellar mass lesion.  Developed hypercarbic respiratory failure in ER requiring bipap.  PCCM consulted for ICU admission.   SIGNIFICANT EVENTS: 7/04 - Admit with HA, blurry vision, vomiting (dark emesis, ? blood). CT positive for suprasellar mass 7/5 BIPAP overnight 7/6 significant AMS, improving with BiPAP  STUDIES:  7/04 - CT head >> prominent, large sellar & suprasellar mass lesion with extension into the R cavernous sinus, ssuperior extension beyond the diaphragm sella. Osseous destruction with extension into sphenoid sinus.  LINES / TUBES: R PICC line 7/6>>>  CULTURES: Blood 7/7>>>  ANTIBIOTICS: Diflucan (yeast RLQ wound) 7/4>>> Vancomycin 7/7>>>  SUBJECTIVE:  Very anxious overnight and required BiPAP again for labored breathing.  VITAL SIGNS: Temp:  [97.8 F (36.6 C)-98.3 F (36.8 C)] 98 F (36.7 C) (07/08 0839) Pulse Rate:  [64-110] 66 (07/08 0700) Resp:  [8-27] 14 (07/08 0700) BP: (115-168)/(70-112) 134/83 mmHg (07/08 0700) SpO2:  [91 %-100 %] 99 % (07/08 0700) FiO2 (%):  [50 %] 50 % (07/08 0405) Weight:  [461 lb (209.108 kg)] 461 lb (209.108 kg) (07/08 0414) HEMODYNAMICS:   VENTILATOR SETTINGS: Vent Mode:  [-]  FiO2 (%):  [50 %] 50 %  INTAKE / OUTPUT: Intake/Output     07/07 0701 - 07/08 0700 07/08 0701 - 07/09 0700   P.O. 750    I.V. (mL/kg) 30 (0.1)    IV Piggyback 500    Total Intake(mL/kg) 1280 (6.1)    Urine (mL/kg/hr) 4425 (0.9)    Drains     Total Output 4425     Net -3145           PHYSICAL EXAMINATION:  Gen:  morbidly obese, somewhat labored breathing on BiPAP and very anxious HEENT: NCAT, right eye ptosis but pupils are reactive PULM: Distant BS diffusely CV: RRR, no mgr, no JVD AB: BS+, soft, nontender, no hsm Ext: warm, + leg edema, no clubbing, no cyanosis Derm: R abdominal wound vac Neuro: Asleep but arouses easily, moves ext to command, right ptosis persists  LABS:  CBC  Recent Labs Lab 12/08/13 0310 12/09/13 0700 12/10/13 0425  WBC 8.0 7.0 5.7  HGB 12.8* 12.7* 12.2*  HCT 42.8 42.6 41.0  PLT 131* 120* 127*   Coag's No results found for this basename: APTT, INR,  in the last 168 hours BMET  Recent Labs Lab 12/08/13 0310 12/09/13 0700 12/10/13 0425  NA 138 140 141  K 4.3 4.4 3.7  CL 97 95* 95*  CO2 35* 39* 39*  BUN 17 15 16   CREATININE 0.87 0.82 0.84  GLUCOSE 119* 92 101*   Electrolytes  Recent Labs Lab 12/08/13 0310 12/09/13 0700 12/10/13 0425  CALCIUM 8.8 9.0 8.9  MG  --  2.1 2.1  PHOS  --  2.0* 2.0*   Sepsis Markers  Recent Labs Lab 12/06/13 0855  LATICACIDVEN 0.8   ABG  Recent Labs Lab 12/06/13 0758 12/06/13 1012  PHART 7.218* 7.256*  PCO2ART 78.5* 76.9*  PO2ART 82.0 118.0*   Liver Enzymes  Recent Labs Lab 12/06/13  0855  AST 23  ALT 35  ALKPHOS 100  BILITOT 0.4  ALBUMIN 3.9   Cardiac Enzymes No results found for this basename: TROPONINI, PROBNP,  in the last 168 hours Glucose  Recent Labs Lab 12/06/13 2023  GLUCAP 124*   Imaging  ASSESSMENT / PLAN:  PULMONARY A: Acute on Chronic Hypercarbic Respiratory Failure > due to OSA / OHS High Risk Respiratory Failure - in setting of morbid obesity, OSA/OHS + acute narcotic usage P:   - Maintain on BiPAP for now. - Minimize sedating/resp suppressing meds (benzo/opiates). - Will likely come out of the OR intubated, will check F/U CXR and ABG.  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 - To OR today for sellar mass. - D/C NSAIDS. - Continue home wellbutrin, gabapentin. - See endocrine for w/u.  CARDIOVASCULAR A:  Hypertension  Bradycardia  dCHF  P:  - Continue home meds:  Lasix, coreg. - Continue norvasc 5 mg PO daily with holding parameters. - ICU monitoring.  RENAL A:   No acute issues  P:   - Trend BMP. - Replace lytes as indicated. - Continue lasix 40 mg PO daily.  GASTROINTESTINAL A:   Morbid Obesity  Hematemesis - 1 episode, on NSAID at baseline.  No further bleeding  At Risk Aspiration - with hypercarbic resp failure P:   - NPO for OR visit today. - PPI. - If remains intubated post-op will likely need TF as essentially been on liquid diet for days now.  HEMATOLOGIC A:   Mild Thrombocytopenia  P:  - DVT:  SQ Heparin.  INFECTIOUS A:   RLQ Chronic Wound VAC Yeast RLQ  P:   - Monitor fever curve / leukocytosis  - Diflucan for candida at wound site per ID's recommendations. - Continue topical anti-fungal. - WOC following for [facility] assessment & changes.  Changed at home M/W/F - Appreciate input from surgery and ID. - IV vanc (MRSA) and a dose of ancef (proteus) that can be d/ced post op per ID's recommendations.  ENDOCRINE A:  Sellar & Suprasellar Mass Hyperglycemia   P:   - Needs endocrine f/u post op. - Check cortisol level today. - SSI  To OR today, will need to assess respiratory status post op, continue gentle diureses, endocrine w/u post op.  I have personally obtained a history, examined the patient, evaluated laboratory and imaging results, formulated the assessment and plan and placed orders.  CRITICAL CARE: The patient is critically ill with multiple organ systems failure and requires high complexity decision making for assessment and support, frequent evaluation and titration of  therapies, application of advanced monitoring technologies and extensive interpretation of multiple databases. Critical Care Time devoted to patient care services described in this note is 35 minutes.   Rush Farmer, M.D. [facility] Pulmonary/Critical Care Medicine. Pager: [phone]. After hours pager: [phone].  12/10/2013, 9:29 AM

## 2013-12-11 ENCOUNTER — Inpatient Hospital Stay (HOSPITAL_COMMUNITY): Payer: BC Managed Care – PPO

## 2013-12-11 DIAGNOSIS — B379 Candidiasis, unspecified: Secondary | ICD-10-CM

## 2013-12-11 LAB — CBC
HCT: 41.8 % (ref 39.0–52.0)
Hemoglobin: 12.7 g/dL — ABNORMAL LOW (ref 13.0–17.0)
MCH: 27.3 pg (ref 26.0–34.0)
MCHC: 30.4 g/dL (ref 30.0–36.0)
MCV: 89.7 fL (ref 78.0–100.0)
Platelets: 111 10*3/uL — ABNORMAL LOW (ref 150–400)
RBC: 4.66 MIL/uL (ref 4.22–5.81)
RDW: 17.6 % — ABNORMAL HIGH (ref 11.5–15.5)
WBC: 8.6 10*3/uL (ref 4.0–10.5)

## 2013-12-11 LAB — BASIC METABOLIC PANEL
Anion gap: 12 (ref 5–15)
BUN: 14 mg/dL (ref 6–23)
CO2: 34 mEq/L — ABNORMAL HIGH (ref 19–32)
Calcium: 8.5 mg/dL (ref 8.4–10.5)
Chloride: 94 mEq/L — ABNORMAL LOW (ref 96–112)
Creatinine, Ser: 1.03 mg/dL (ref 0.50–1.35)
GFR calc Af Amer: >90 mL/min (ref >90)
GFR calc non Af Amer: 84 mL/min — ABNORMAL LOW (ref >90)
Glucose, Bld: 112 mg/dL — ABNORMAL HIGH (ref 70–99)
Potassium: 3.4 mEq/L — ABNORMAL LOW (ref 3.7–5.3)
Sodium: 140 mEq/L (ref 137–147)

## 2013-12-11 LAB — PHOSPHORUS: Phosphorus: 2.5 mg/dL (ref 2.3–4.6)

## 2013-12-11 LAB — MAGNESIUM: Magnesium: 1.8 mg/dL (ref 1.5–2.5)

## 2013-12-11 MED ORDER — ALPRAZOLAM 0.25 MG PO TABS
0.2500 mg | ORAL_TABLET | ORAL | Status: DC | PRN
  Administered 2013-12-11 – 2013-12-20 (×22): 0.25 mg via ORAL
  Filled 2013-12-11 (×26): qty 1

## 2013-12-11 MED ORDER — FENTANYL CITRATE 0.05 MG/ML IJ SOLN
25.0000 ug | INTRAMUSCULAR | Status: DC | PRN
  Administered 2013-12-11 (×4): 50 ug via INTRAVENOUS
  Filled 2013-12-11 (×4): qty 2

## 2013-12-11 MED ORDER — OXYCODONE-ACETAMINOPHEN 5-325 MG PO TABS
1.0000 | ORAL_TABLET | Freq: Three times a day (TID) | ORAL | Status: DC | PRN
  Administered 2013-12-11 – 2013-12-13 (×5): 1 via ORAL
  Filled 2013-12-11 (×5): qty 1

## 2013-12-11 MED ORDER — HYDROMORPHONE HCL PF 1 MG/ML IJ SOLN
0.5000 mg | INTRAMUSCULAR | Status: DC | PRN
  Administered 2013-12-11 – 2013-12-12 (×5): 0.5 mg via INTRAVENOUS
  Filled 2013-12-11 (×5): qty 1

## 2013-12-11 MED ORDER — FENTANYL CITRATE 0.05 MG/ML IJ SOLN
100.0000 ug | INTRAMUSCULAR | Status: DC | PRN
  Administered 2013-12-11 (×7): 100 ug via INTRAVENOUS
  Filled 2013-12-11 (×7): qty 2

## 2013-12-11 NOTE — Progress Notes (Addendum)
Per SLP recommendations, pt ok for regular consistency diet with thin liquids. Pt requested to have a low fat diet in addition to his previously ordered Juven and protein 4mL supplement. CCM aware. Low fat diet ordered.

## 2013-12-11 NOTE — Progress Notes (Signed)
eLink Physician-Brief Progress Note Patient Name: [name] [name] DOB: 04-04-1966 MRN: [id number]  Date of Service  12/11/2013   HPI/Events of Note   Wants opioid rotation from fentanyl to dilaudid  eICU Interventions  Dc fentanyl Start dilaudid 0.5mg  IV q2hprn   Intervention Category Minor Interventions: Routine modifications to care plan (e.g. PRN medications for pain, fever)  Clete Kuch 12/11/2013, 9:44 PM

## 2013-12-11 NOTE — Progress Notes (Signed)
Patient ID: [name] [name], male   DOB: 19-Jul-1965, 48 y.o.   MRN: [id number] Awake, f/c.left eye much better. Right unchanged. Moves all 4 extremities. Continue as per CCM.

## 2013-12-11 NOTE — Progress Notes (Signed)
eLink Physician-Brief Progress Note Patient Name: Warden Buffa DOB: April 24, 1966 MRN: [id number]  Date of Service  12/11/2013   HPI/Events of Note   Rn reporting fentanyl needs are too frequent and desires something longer acting  eICU Interventions  Percocet 1 tid prn x 2 days   Intervention Category Minor Interventions: Other:  Tenya Araque 12/11/2013, 7:07 PM

## 2013-12-11 NOTE — Progress Notes (Signed)
PULMONARY / CRITICAL CARE MEDICINE   Name: [name] [name] MRN: [id number] DOB: 06-18-1965    ADMISSION DATE:  12/06/2013  REFERRING MD :  Dr. Doy Mince PRIMARY SERVICE: PCCM   CHIEF COMPLAINT:  Hypercarbic Respiratory Failure   BRIEF PATIENT DESCRIPTION: 48 y/o M with a PMH of HTN, dCHF, OSA who presented to [facility] ER with 2 day hx of HA, blurry vision, & 1 episode of dark emesis (? If blood).  CT evaluation noted sellar & suprasellar mass lesion.  Developed hypercarbic respiratory failure in ER requiring bipap.  PCCM consulted for ICU admission.   SIGNIFICANT EVENTS: 7/04 - Admit with HA, blurry vision, vomiting (dark emesis, ? blood). CT positive for suprasellar mass 7/5 BIPAP overnight 7/6 significant AMS, improving with BiPAP 7/8 To the OR for sellar mass, remained intubated overnight 7/9 Extubated, no BiPAP due to nasal packing  STUDIES:  7/04 - CT head >> prominent, large sellar & suprasellar mass lesion with extension into the R cavernous sinus, ssuperior extension beyond the diaphragm sella. Osseous destruction with extension into sphenoid sinus.  LINES / TUBES: R PICC line 7/6>>> ETT 7/8>>>7/9  CULTURES: Blood 7/7>>>  ANTIBIOTICS: Diflucan (yeast RLQ wound) 7/4>>> Vancomycin 7/7>>>7/9  SUBJECTIVE:  Intubated but awake, no events overnight.  VITAL SIGNS: Temp:  [97.9 F (36.6 C)-98.4 F (36.9 C)] 98.3 F (36.8 C) (07/09 0800) Pulse Rate:  [61-81] 81 (07/09 1120) Resp:  [10-19] 13 (07/09 1120) BP: (122-169)/(58-98) 152/83 mmHg (07/09 1100) SpO2:  [85 %-100 %] 95 % (07/09 1120) FiO2 (%):  [40 %-50 %] 40 % (07/09 0900) Weight:  [458 lb 8.9 oz (208 kg)] 458 lb 8.9 oz (208 kg) (07/09 0325) HEMODYNAMICS:   VENTILATOR SETTINGS: Vent Mode:  [-] CPAP FiO2 (%):  [40 %-50 %] 40 % Set Rate:  [16 bmp] 16 bmp Vt Set:  [520 mL-600 mL] 520 mL PEEP:  [5 cmH20] 5 cmH20 Pressure Support:  [5 GMW10-27 cmH20] 5 cmH20 Plateau Pressure:  [18 cmH20-20 cmH20] 20 cmH20  INTAKE /  OUTPUT: Intake/Output     07/08 0701 - 07/09 0700 07/09 0701 - 07/10 0700   P.O.     I.V. (mL/kg) 2569.3 (12.4) 277 (1.3)   IV Piggyback 1010    Total Intake(mL/kg) 3579.3 (17.2) 277 (1.3)   Urine (mL/kg/hr) 5350 (1.1) 190 (0.2)   Drains 100 (0)    Blood 150 (0)    Total Output 5600 190   Net -2020.7 +87         PHYSICAL EXAMINATION:  Gen: morbidly obese, intubated but awake and interactive HEENT: NCAT, right eye ptosis but pupils are reactive PULM: Distant BS diffusely CV: RRR, no mgr, no JVD AB: BS+, soft, nontender, no hsm Ext: warm, 1+ leg edema, no clubbing, no cyanosis Derm: R abdominal wound vac Neuro: Awake, anxious, moving all ext to command  LABS:  CBC  Recent Labs Lab 12/09/13 0700 12/10/13 0425 12/11/13 0454  WBC 7.0 5.7 8.6  HGB 12.7* 12.2* 12.7*  HCT 42.6 41.0 41.8  PLT 120* 127* 111*   Coag's No results found for this basename: APTT, INR,  in the last 168 hours BMET  Recent Labs Lab 12/09/13 0700 12/10/13 0425 12/11/13 0454  NA 140 141 140  K 4.4 3.7 3.4*  CL 95* 95* 94*  CO2 39* 39* 34*  BUN 15 16 14   CREATININE 0.82 0.84 1.03  GLUCOSE 92 101* 112*   Electrolytes  Recent Labs Lab 12/09/13 0700 12/10/13 0425 12/11/13 0454  CALCIUM 9.0 8.9  8.5  MG 2.1 2.1 1.8  PHOS 2.0* 2.0* 2.5   Sepsis Markers  Recent Labs Lab 12/06/13 0855  LATICACIDVEN 0.8   ABG  Recent Labs Lab 12/06/13 0758 12/06/13 1012 12/10/13 2250  PHART 7.218* 7.256* 7.488*  PCO2ART 78.5* 76.9* 47.6*  PO2ART 82.0 118.0* 67.0*   Liver Enzymes  Recent Labs Lab 12/06/13 0855  AST 23  ALT 35  ALKPHOS 100  BILITOT 0.4  ALBUMIN 3.9   Cardiac Enzymes No results found for this basename: TROPONINI, PROBNP,  in the last 168 hours Glucose  Recent Labs Lab 12/06/13 2023  GLUCAP 124*   Imaging  ASSESSMENT / PLAN:  PULMONARY A: Acute on Chronic Hypercarbic Respiratory Failure > due to OSA / OHS High Risk Respiratory Failure - in setting of  morbid obesity, OSA/OHS + acute narcotic usage P:   - Extubate. - OOB to chair. - Swallow evaluation. - D/C sedation. - Restart BiPAP once nasal packing is out tomorrow.   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 - Continue home wellbutrin, gabapentin. - See endocrine for w/u.  CARDIOVASCULAR A:  Hypertension  Bradycardia  dCHF  P:  - Continue home meds:  Lasix, coreg. - Continue norvasc 5 mg PO daily with holding parameters. - ICU monitoring.  RENAL A:   No acute issues  P:   - Trend BMP. - Replace lytes as indicated. - Continue lasix 40 mg PO daily.  GASTROINTESTINAL A:   Morbid Obesity  Hematemesis - 1 episode, on NSAID at baseline.  No further bleeding  At Risk Aspiration - with hypercarbic resp failure P:   - Swallow evaluation today. - PPI.  HEMATOLOGIC A:   Mild Thrombocytopenia  P:  - DVT:  SQ Heparin.  INFECTIOUS A:   RLQ Chronic Wound VAC Yeast RLQ  P:   - Monitor fever curve/leukocytosis. - Diflucan for candida at wound site per ID's recommendations. - Continue topical anti-fungal. - WOC following for [facility] assessment & changes.  Changed at home M/W/F - Appreciate input from surgery and ID. - Vanc d/ced per IDs recommendations.  ENDOCRINE A:  Sellar & Suprasellar Mass Hyperglycemia   P:   - Needs endocrine f/u post op by a few days. - Cortisol level 21.1, will repeat in a few days or if hypotensive. - SSI.  Extubate today, swallow eval, nasal packing off tomorrow then restart BiPAP tomorrow, replace K, PT/OT, OOB to chair, titrate O2 down, will need endocrine work up in a few days.  I have personally obtained a history, examined the patient, evaluated laboratory and imaging results, formulated the assessment and plan and placed orders.  CRITICAL CARE: The patient is  critically ill with multiple organ systems failure and requires high complexity decision making for assessment and support, frequent evaluation and titration of therapies, application of advanced monitoring technologies and extensive interpretation of multiple databases. Critical Care Time devoted to patient care services described in this note is 35 minutes.   Rush Farmer, M.D. [facility] Pulmonary/Critical Care Medicine. Pager: [phone]. After hours pager: [phone].  12/11/2013, 11:45 AM

## 2013-12-11 NOTE — Evaluation (Signed)
Clinical/Bedside Swallow Evaluation Patient Details  Name: [name] [name] MRN: [id number] Date of Birth: Feb 14, 1966  Today's Date: 12/11/2013 Time: 1530-1605 SLP Time Calculation (min): 35 min  Past Medical History:  Past Medical History  Diagnosis Date  . Sleep apnea   . Allergic rhinitis   . Diastolic CHF   . Gout   . Nephrolithiasis   . Peripheral neuropathy   . Hypertension    Past Surgical History:  Past Surgical History  Procedure Laterality Date  . Sigmoid colonoscopy    . Hernia repair    . Abdominal surgery      for cyst with MRSA   HPI:  48 y/o M with a PMH of HTN, dCHF, OSA who presented to [facility] ER with 2 day hx of HA, blurry vision, & 1 episode of dark emesis (? If blood). CT evaluation noted sellar & suprasellar mass lesion. Developed hypercarbic respiratory failure in ER requiring bipap. On 7/8, taken to OR for transphenoidal resection of pituitary tumor. Returned to ICU on vent for ~1day.  Patient now off vent; orders for bedside swallow received.     Assessment / Plan / Recommendation Clinical Impression  Patient presents with intact oral and pharyngeal phase of swallow with no observed s/s of aspiration at bedside.  Patient required assist for self-feeding and cues for pacing due to being asymptomatic  and unable to tell when he desaturated during PO intake.  Recommend initiation of a regular texture and thin liquid diet and defer to MD to determine other dietary restriction as patient stated he was a strict diet prescribed by his MD prior to admission.       Aspiration Risk  Mild    Diet Recommendation Regular;Thin liquid   Liquid Administration via: Cup;No straw due to surgery  Medication Administration: Whole meds with liquid Supervision: Patient able to self feed;Staff to assist with self feeding;Full supervision/cueing for compensatory strategies Compensations: Slow rate;Small sips/bites (breathing breaks) Postural Changes and/or Swallow Maneuvers:   (upright as much as possible )    Other  Recommendations Oral Care Recommendations: Oral care BID   Follow Up Recommendations  Other (comment) (TBD)    Frequency and Duration min 2x/week  1 week   Pertinent Vitals/Pain none    SLP Swallow Goals  See care plan for details   Swallow Study Prior Functional Status  Regular and thin per patient report    General HPI: 48 y/o M with a PMH of HTN, dCHF, OSA who presented to [facility] ER with 2 day hx of HA, blurry vision, & 1 episode of dark emesis (? If blood). CT evaluation noted sellar & suprasellar mass lesion. Developed hypercarbic respiratory failure in ER requiring bipap. On 7/8, taken to OR for transphenoidal resection of pituitary tumor. Returned to ICU on vent for ~1day.  Patient now off vent; orders for bedside swallow received.   Type of Study: Bedside swallow evaluation Previous Swallow Assessment: none on record Diet Prior to this Study: NPO Temperature Spikes Noted: No Respiratory Status: Other (comment) (face tent 3L) History of Recent Intubation: Yes Length of Intubations (days): 1 days Date extubated: 12/10/13 Behavior/Cognition: Alert;Cooperative;Pleasant mood Oral Cavity - Dentition: Adequate natural dentition Self-Feeding Abilities: Able to feed self;Needs set up Patient Positioning: Partially reclined Baseline Vocal Quality: Clear Volitional Cough: Strong Volitional Swallow: Able to elicit    Oral/Motor/Sensory Function Overall Oral Motor/Sensory Function: Appears within functional limits for tasks assessed   Ice Chips Ice chips: Within functional limits Presentation: Cup;Spoon   Thin Liquid  Thin Liquid: Within functional limits Presentation: Cup;Self Fed Other Comments: no straw per RN due to nasal packing     Nectar Thick Nectar Thick Liquid: Not tested   Honey Thick Honey Thick Liquid: Not tested   Puree Puree: Within functional limits Presentation: Self Fed;Spoon   Solid   GO    Solid: Within functional  limits Presentation: Self Fed      Gunnar Fusi, M.A., CCC-SLP [phone]  [place] 12/11/2013,4:41 PM

## 2013-12-11 NOTE — Op Note (Signed)
[name] [name], [name] [name] NO.:  [id number]  MEDICAL RECORD NO.:  [id number]  LOCATION:  3M11C                        FACILITY:  [facility]  PHYSICIAN:  [name], M.D.   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  Morbid obesity.  Status post multiple abdominal surgeries with MRSA infection of the abdominal wall.  Sleep apnea.  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  Morbid obesity.  Status post multiple abdominal surgeries with MRSA infection of the abdominal wall.  Sleep apnea.  PROCEDURE:  Trans-sphenoidal resection of the tumor.  Microscope.  ENT:  Leonides Sake. Lucia Gaskins, M.D.  NEUROSURGERY:  [name], M.D.  Dr. Lucia Gaskins will be dictating the ENT portion of the operative note.  CLINICAL HISTORY:  The gentleman was admitted through the emergency room because of sudden onset of headache associated with diplopia.  He is quite morbidly obese.  He was seen by the neurologist, was admitted by the Critical Care Medicine to the intensive care unit.  Clinically, he has compromise of the right eye with ptosis, increased with the light of the pupil in the right eye, also some compromise of the left eye.  CT scan showed large mass and finally we were able to do an MRI.  There was a question because of the weight.  Nevertheless, the MRI showed that there was a tumor in the suprasellar compromising the cavernous sinus bilaterally, right worse than the left one, extension in the sphenoid sinus forward posterior close to the clivus.  Surgery was advised.  I talked to him more than at length.  He knew the risk  of the surgery including possibility of being unable to remove the tumor, complications associated with his obesity, sleep apnea, infection, CSF leak, stroke, no improvement whatsoever.  PROCEDURE IN DETAIL:  The patient was taken to the OR after intubation. Dr. Lucia Gaskins from the ENT took over and by the time I got involved, the microscope was pointing me to the area of the sphenoid sinus.  Indeed, the sphenoid sinus was full of tumor and using the dissector as well as multiple pituitaries spoons, we started removing tumor anterior to the sinus and posterior into the clivus.  Then, I opened the sella floor, indeed the tumor was growing from there.  It seemed that the tumor came anteriorly into the sinus.  Once I opened the sella, large amount of necrotic tumor came into view and dissection was carried out up to the point and at the end we had probably a total gross resection, but we were quite careful not to get close to the cavernous sinus to prevent any major bleeding.  At the end, we did a Valsalva maneuver to bring more tumor into view.  Finally, the last thing thing we saw was subarachnoid pouch of CSF coming into view.  There was no evidence of any  CSF leak.  From then on, I used artificial dura with surgical glue to prevent any further CSF leak.  From then on, I went back again and looked at the sphenoid sinus and indeed, there was no tumor found, although there might be some close to the clivus.  Specimen was sent to the laboratory. From then on, Dr. Lucia Gaskins took over to close the wound.  The patient was going back to the intensive care unit.          ______________________________ [name], M.D.     EB/MEDQ  D:  12/10/2013  T:  12/11/2013  Job:  [id number]

## 2013-12-11 NOTE — Procedures (Signed)
Extubation Procedure Note  Patient Details:   Name: [name] [name] DOB: 10/13/1965 MRN: [id number]   Airway Documentation:     Evaluation  O2 sats: stable throughout Complications: No apparent complications Patient did tolerate procedure well. Bilateral Breath Sounds: Clear;Diminished Suctioning: Airway Yes   Johnette Abraham 12/11/2013, 10:47 AM

## 2013-12-11 NOTE — Anesthesia Postprocedure Evaluation (Signed)
Anesthesia Post Note  Patient: [name] [name]  Procedure(s) Performed: Procedure(s) (LRB): Transpheniodal resection of Pituitary tumor with Dr. Radene Journey for approach (N/A) TRANSNASAL APPROACH (N/A)  Anesthesia type: General  Patient location: ICU  Post pain: Pain level controlled  Post assessment: Post-op Vital signs reviewed  Last Vitals:  Filed Vitals:   12/11/13 0400  BP: 140/80  Pulse: 72  Temp:   Resp: 17    Post vital signs: stable  Level of consciousness: Patient remains intubated per anesthesia plan  Complications: No apparent anesthesia complications

## 2013-12-12 ENCOUNTER — Encounter (HOSPITAL_COMMUNITY): Payer: Self-pay | Admitting: Neurosurgery

## 2013-12-12 LAB — BASIC METABOLIC PANEL
Anion gap: 11 (ref 5–15)
BUN: 10 mg/dL (ref 6–23)
CO2: 35 mEq/L — ABNORMAL HIGH (ref 19–32)
Calcium: 8.4 mg/dL (ref 8.4–10.5)
Chloride: 97 mEq/L (ref 96–112)
Creatinine, Ser: 0.79 mg/dL (ref 0.50–1.35)
GFR calc Af Amer: >90 mL/min (ref >90)
GFR calc non Af Amer: >90 mL/min (ref >90)
Glucose, Bld: 120 mg/dL — ABNORMAL HIGH (ref 70–99)
Potassium: 3 mEq/L — ABNORMAL LOW (ref 3.7–5.3)
Sodium: 143 mEq/L (ref 137–147)

## 2013-12-12 LAB — MAGNESIUM: Magnesium: 1.8 mg/dL (ref 1.5–2.5)

## 2013-12-12 LAB — CBC
HCT: 39.8 % (ref 39.0–52.0)
Hemoglobin: 12.2 g/dL — ABNORMAL LOW (ref 13.0–17.0)
MCH: 27 pg (ref 26.0–34.0)
MCHC: 30.7 g/dL (ref 30.0–36.0)
MCV: 88.1 fL (ref 78.0–100.0)
Platelets: 121 10*3/uL — ABNORMAL LOW (ref 150–400)
RBC: 4.52 MIL/uL (ref 4.22–5.81)
RDW: 17.9 % — ABNORMAL HIGH (ref 11.5–15.5)
WBC: 7.6 10*3/uL (ref 4.0–10.5)

## 2013-12-12 LAB — ACTH
C206 ACTH: 176 pg/mL — ABNORMAL HIGH (ref 6–50)
C206 ACTH: 135 pg/mL — ABNORMAL HIGH (ref 6–50)

## 2013-12-12 LAB — PHOSPHORUS: Phosphorus: 2.3 mg/dL (ref 2.3–4.6)

## 2013-12-12 MED ORDER — POTASSIUM PHOSPHATES 15 MMOLE/5ML IV SOLN
30.0000 mmol | Freq: Once | INTRAVENOUS | Status: AC
  Administered 2013-12-12: 30 mmol via INTRAVENOUS
  Filled 2013-12-12: qty 10

## 2013-12-12 MED ORDER — MAGNESIUM SULFATE 40 MG/ML IJ SOLN
2.0000 g | Freq: Once | INTRAMUSCULAR | Status: AC
  Administered 2013-12-12: 2 g via INTRAVENOUS
  Filled 2013-12-12: qty 50

## 2013-12-12 NOTE — Progress Notes (Signed)
Occupational Therapy Evaluation Patient Details Name: [name] [name] MRN: [id number] DOB: 09/26/1965 Today's Date: 12/12/2013    History of Present Illness HPI: 48 y/o M with a PMH of HTN, dCHF, OSA who presented to [facility] ER with 2 day hx of HA, blurry vision, & 1 episode of dark emesis (? If blood). CT evaluation noted sellar & suprasellar mass lesion. Developed hypercarbic respiratory failure in ER requiring bipap. On 7/8, taken to OR for transphenoidal resection of pituitary tumor. Returned to ICU on vent for ~1day.  Patient now off vent and therapy orders received.   Clinical Impression   PTA, pt living with parents and was apparently mod I with mobility and ADL @ w/c or RW level and with use of AE for ADL. Pt just recently moved in to live with his parents. Pt presents  with the below deficits and would greatly benefit from CIR to maximize functional level of independence with mobility and ADL to facilitate safe D/C home with family. Pt is extremely motivated to return to PLOF. Will plan to return later to assess pt with use of spot occlusion for diplopia. Pt will benefit from skilled OT services to facilitate D/C to CIR due to below deficits.    Follow Up Recommendations  CIR;Supervision/Assistance - 24 hour    Equipment Recommendations  Other (comment) (AE as indicated)    Recommendations for Other Services Rehab consult     Precautions / Restrictions Precautions Precautions: Fall;Other (comment) Precaution Comments: wound vac Restrictions Weight Bearing Restrictions: No      Mobility Bed Mobility Overal bed mobility: Needs Assistance Bed Mobility: Rolling;Sidelying to Sit Rolling: Min assist;+2 for physical assistance;+2 for safety/equipment Sidelying to sit: Min assist       General bed mobility comments: Assist to help rotate trunk and hips to EOB  Transfers Overall transfer level: Needs assistance Equipment used: Rolling walker (2 wheeled) Transfers: Sit to/from  Stand Sit to Stand: Mod assist;+2 physical assistance;+2 safety/equipment Stand pivot transfers: Mod assist;+2 physical assistance;+2 safety/equipment       General transfer comment: Most difficulty with power up to standing. c/o b knee pain.     Balance Overall balance assessment: Needs assistance Sitting-balance support: Feet supported Sitting balance-Leahy Scale: Good Sitting balance - Comments: tolerated sitting EOB >20 minutes static and dynamic movements tolerated in sitting EOB   Standing balance support: Bilateral upper extremity supported;During functional activity Standing balance-Leahy Scale: Fair Standing balance comment: heavily relying on RW                            ADL Overall ADL's : Needs assistance/impaired Eating/Feeding: Modified independent (liquid diet)   Grooming: Set up;Sitting   Upper Body Bathing: Minimal assitance;Sitting   Lower Body Bathing: Maximal assistance;Sit to/from stand   Upper Body Dressing : Moderate assistance;Sitting   Lower Body Dressing: Sit to/from stand;Total assistance (without AE)   Toilet Transfer: +2 for physical assistance;Moderate assistance (simulated to recliner)   Toileting- Clothing Manipulation and Hygiene: Total assistance Toileting - Clothing Manipulation Details (indicate cue type and reason): foley     Functional mobility during ADLs: +2 for physical assistance;Moderate assistance;Rolling walker (bariatric) General ADL Comments: At baseline pt uses AE for LB ADL (reacher and sock aid). Pt states he has ordered a reacher. Discussed benefits of using a toilet aid.     Vision Eye Alignment: Impaired (comment) Alignment/Gaze Preference:  (no preference for head position) Ocular Range of Motion: Restricted on the left;Restricted  looking up;Restricted looking down Tracking/Visual Pursuits: Right eye does not track medially;Decreased smoothness of eye movement to LEFT inferior field;Decreased smoothness  of eye movement to LEFT superior field;Impaired - to be further tested in functional context Saccades: Decreased speed of saccadic movement;Impaired - to be further tested in functional context Convergence: Impaired (comment) Diplopia Assessment: Disappears with one eye closed;Objects split side to side Depth Perception: Undershoots Additional Comments: Pt with R eye ptopsis - able to open now; keeps R eye close due to diplpia. Apparent CN III palsy. R pupil dialated greater than L; Difficulty with adduction and up/downward moevment R eye. When L eye closed, pt is able to bring R eye to midline position. with binocular vision, R eye moves laterally.    Perception     Praxis      Pertinent Vitals/Pain HR increased to 120s with bed mobility. O2 sats decreased to 88-89  during bed mobility. Dyspnea 2/4. Quickly rebounded to 94-96 - RA     Hand Dominance Right   Extremity/Trunk Assessment Upper Extremity Assessment Upper Extremity Assessment: Generalized weakness "popping" of elbow/shoulder during movement. Pt states this is his baseline   Lower Extremity Assessment Lower Extremity Assessment:  (c/o intense B knee pain; +crepetus) RLE: Unable to fully assess due to pain LLE: Unable to fully assess due to pain   Cervical / Trunk Assessment Cervical / Trunk Assessment: Normal   Communication Communication Communication: No difficulties   Cognition Arousal/Alertness: Awake/alert Behavior During Therapy: WFL for tasks assessed/performed Overall Cognitive Status: No family/caregiver present to determine baseline cognitive functioning                     General Comments   Pt very appreciative of help. States he is actively working with his physician on a weight loss program.    Exercises Exercises: Other exercises Other Exercises Other Exercises: Pt educated on activities to encourage medial movement of R eye -gaze stabilization with L eye covered   Shoulder Instructions       Home Living Family/patient expects to be discharged to:: Private residence Living Arrangements: Parent Available Help at Discharge: Family;Available 24 hours/day Type of Home: House Home Access: Stairs to enter [employer] of Steps: 6 Entrance Stairs-Rails: Right Home Layout: One level     Bathroom Shower/Tub: Teacher, early years/pre: Standard Bathroom Accessibility: Yes How Accessible: Accessible via walker Home Equipment: [place] - 2 wheels;Wheelchair - [employer];Hospital bed (working with MD on trying to get power chair)   Additional Comments: Has ordered powerred w/c      Prior Functioning/Environment Level of Independence: Independent with assistive device(s)        Comments: ; currently has wound vac R lateral lower abdomen. States he has had vac since January.; wound carea nurse comes out to house 2 days/week. therapists hadjust D/C ed pt.     OT Diagnosis: Generalized weakness;Disturbance of vision   OT Problem List: Decreased strength;Decreased range of motion;Decreased activity tolerance;Impaired balance (sitting and/or standing);Decreased knowledge of use of DME or AE;Cardiopulmonary status limiting activity;Obesity;Pain   OT Treatment/Interventions: Self-care/ADL training;Therapeutic exercise;Energy conservation;DME and/or AE instruction;Therapeutic activities;Visual/perceptual remediation/compensation;Patient/family education;Balance training    OT Goals(Current goals can be found in the care plan section) Acute Rehab OT Goals Patient Stated Goal: to be independent and not live with my parents OT Goal Formulation: With patient Time For Goal Achievement: 12/26/13 Potential to Achieve Goals: Good  OT Frequency: Min 3X/week   Barriers to D/C:    supportive family;  parents are elderly       Co-evaluation PT/OT/SLP Co-Evaluation/Treatment: Yes Reason for Co-Treatment: Complexity of the patient's impairments (multi-system  involvement);For patient/therapist safety PT goals addressed during session: Mobility/safety with mobility;Balance        End of Session Equipment Utilized During Treatment: Gait belt;Rolling walker;Other (comment) (wound vac) Nurse Communication: Mobility status;Precautions;Other (comment) (ability to recline chair into stretcher position in order to)  Activity Tolerance: Patient tolerated treatment well Patient left: in chair;with call bell/phone within reach   Time: 1128-1240 OT Time Calculation (min): 72 min Charges:  OT General Charges $OT Visit: 1 Procedure OT Evaluation $Initial OT Evaluation Tier I: 1 Procedure OT Treatments $Self Care/Home Management : 23-37 mins G-Codes:    Ozias Dicenzo,HILLARY Dec 20, 2013, 2:47 PM   [facility], OTR/L  [phone] 12-20-13

## 2013-12-12 NOTE — Progress Notes (Signed)
PULMONARY / CRITICAL CARE MEDICINE   Name: [name] [name] MRN: [id number] DOB: 24-Dec-1965    ADMISSION DATE:  12/06/2013  REFERRING MD :  Dr. Doy Mince PRIMARY SERVICE: PCCM   CHIEF COMPLAINT:  Hypercarbic Respiratory Failure   BRIEF PATIENT DESCRIPTION: 48 y/o M with a PMH of HTN, dCHF, OSA who presented to [facility] ER with 2 day hx of HA, blurry vision, & 1 episode of dark emesis (? If blood).  CT evaluation noted sellar & suprasellar mass lesion.  Developed hypercarbic respiratory failure in ER requiring bipap.  PCCM consulted for ICU admission.   SIGNIFICANT EVENTS: 7/04 - Admit with HA, blurry vision, vomiting (dark emesis, ? blood). CT positive for suprasellar mass 7/5 BIPAP overnight 7/6 significant AMS, improving with BiPAP 7/8 To the OR for sellar mass, remained intubated overnight 7/9 Extubated, no BiPAP due to nasal packing  STUDIES:  7/04 - CT head >> prominent, large sellar & suprasellar mass lesion with extension into the R cavernous sinus, ssuperior extension beyond the diaphragm sella. Osseous destruction with extension into sphenoid sinus.  LINES / TUBES: R PICC line 7/6>>> ETT 7/8>>>7/9  CULTURES: Blood 7/7>>>  ANTIBIOTICS: Diflucan (yeast RLQ wound) 7/4>>> Vancomycin 7/7>>>7/9  SUBJECTIVE:  Extubated but nasal packing in place and desaturation overnight.  VITAL SIGNS: Temp:  [98 F (36.7 C)-98.9 F (37.2 C)] 98.5 F (36.9 C) (07/10 0328) Pulse Rate:  [61-101] 81 (07/10 0600) Resp:  [8-23] 16 (07/10 0300) BP: (142-178)/(75-98) 150/77 mmHg (07/10 0600) SpO2:  [85 %-100 %] 94 % (07/10 0600) FiO2 (%):  [28 %-40 %] 28 % (07/09 1236) HEMODYNAMICS:   VENTILATOR SETTINGS: Vent Mode:  [-] CPAP FiO2 (%):  [28 %-40 %] 28 % PEEP:  [5 cmH20] 5 cmH20 Pressure Support:  [5 cmH20] 5 cmH20  INTAKE / OUTPUT: Intake/Output     07/09 0701 - 07/10 0700 07/10 0701 - 07/11 0700   P.O. 240    I.V. (mL/kg) 2865.3 (13.8)    Other 350    IV Piggyback     Total  Intake(mL/kg) 3455.3 (16.6)    Urine (mL/kg/hr) 2125 (0.4)    Drains 25 (0)    Blood     Total Output 2150     Net +1305.3           PHYSICAL EXAMINATION:   Gen: morbidly obese, intubated but awake and interactive HEENT: NCAT, right eye ptosis but pupils are reactive PULM: Distant BS diffusely CV: RRR, no mgr, no JVD AB: BS+, soft, nontender, no hsm Ext: warm, 1+ leg edema, no clubbing, no cyanosis Derm: R abdominal wound vac Neuro: Awake, anxious, moving all ext to command  LABS:  CBC  Recent Labs Lab 12/10/13 0425 12/11/13 0454 12/12/13 0350  WBC 5.7 8.6 7.6  HGB 12.2* 12.7* 12.2*  HCT 41.0 41.8 39.8  PLT 127* 111* 121*   Coag's No results found for this basename: APTT, INR,  in the last 168 hours BMET  Recent Labs Lab 12/10/13 0425 12/11/13 0454 12/12/13 0350  NA 141 140 143  K 3.7 3.4* 3.0*  CL 95* 94* 97  CO2 39* 34* 35*  BUN 16 14 10   CREATININE 0.84 1.03 0.79  GLUCOSE 101* 112* 120*   Electrolytes  Recent Labs Lab 12/10/13 0425 12/11/13 0454 12/12/13 0350  CALCIUM 8.9 8.5 8.4  MG 2.1 1.8 1.8  PHOS 2.0* 2.5 2.3   Sepsis Markers  Recent Labs Lab 12/06/13 0855  LATICACIDVEN 0.8   ABG  Recent Labs Lab 12/06/13 0758  12/06/13 1012 12/10/13 2250  PHART 7.218* 7.256* 7.488*  PCO2ART 78.5* 76.9* 47.6*  PO2ART 82.0 118.0* 67.0*   Liver Enzymes  Recent Labs Lab 12/06/13 0855  AST 23  ALT 35  ALKPHOS 100  BILITOT 0.4  ALBUMIN 3.9   Cardiac Enzymes No results found for this basename: TROPONINI, PROBNP,  in the last 168 hours Glucose  Recent Labs Lab 12/06/13 2023  GLUCAP 124*   Imaging  ASSESSMENT / PLAN:  PULMONARY A: Acute on Chronic Hypercarbic Respiratory Failure > due to OSA / OHS High Risk Respiratory Failure - in setting of morbid obesity, OSA/OHS + acute narcotic usage P:   - Remove nasal packing today and restart BiPAP once off. - OOB to chair. - Swallow evaluation passed, resume interesting. - D/C  sedation. - Restart BiPAP once nasal packing is out tomorrow.  - I remain very concerned about his respiratory status given post op status and size of the patient.  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 - Continue home wellbutrin, gabapentin. - See endocrine for w/u.  CARDIOVASCULAR A:  Hypertension  Bradycardia  dCHF  P:  - Continue home meds:  Lasix, coreg. - Continue norvasc 5 mg PO daily with holding parameters. - ICU monitoring.  RENAL A:   No acute issues  P:   - Trend BMP. - Replace lytes as indicated. - Continue lasix 40 mg PO daily. - KVO IVF.  GASTROINTESTINAL A:   Morbid Obesity  Hematemesis - 1 episode, on NSAID at baseline.  No further bleeding  At Risk Aspiration - with hypercarbic resp failure P:   - Swallow evaluation passed, start diet. - PPI.  HEMATOLOGIC A:   Mild Thrombocytopenia  P:  - DVT:  SQ Heparin.  INFECTIOUS A:   RLQ Chronic Wound VAC Yeast RLQ  P:   - Monitor fever curve/leukocytosis. - Diflucan for candida at wound site per ID's recommendations. - Continue topical anti-fungal. - WOC following for [facility] assessment & changes.  Changed at home M/W/F - Appreciate input from surgery and ID. - Vanc d/ced per IDs recommendations.  ENDOCRINE A:  Sellar & Suprasellar Mass Hyperglycemia   P:   - Needs endocrine f/u post op by a few days. - Cortisol level 21.1, will repeat in a few days or if hypotensive. - SSI.  Extubated, desaturating frequently overnight, remain concern for respiratory status, will KVO IVF, continue diureses, attempt to ambulate and get OOB to chair, nasal packing out today, will hold in the ICU for monitoring.  I have personally obtained a history, examined the patient, evaluated laboratory and imaging results, formulated the assessment and plan  and placed orders.  CRITICAL CARE: The patient is critically ill with multiple organ systems failure and requires high complexity decision making for assessment and support, frequent evaluation and titration of therapies, application of advanced monitoring technologies and extensive interpretation of multiple databases. Critical Care Time devoted to patient care services described in this note is 35 minutes.   Rush Farmer, M.D. [facility]. Pager: [phone]. After hours pager: [phone].  12/12/2013, 7:23 AM

## 2013-12-12 NOTE — Op Note (Signed)
[name], [name] NO.:  [id number]  MEDICAL RECORD NO.:  [id number]  LOCATION:                                 FACILITY:  PHYSICIAN:  [name] [name]. [name] [name], M.D.DATE OF BIRTH:  [birth year]  DATE OF PROCEDURE:  12/10/2013 DATE OF DISCHARGE:                              OPERATIVE REPORT   PREOPERATIVE DIAGNOSIS:  Pituitary tumor.  POSTOPERATIVE DIAGNOSIS:  Pituitary tumor.  OPERATION PERFORMED:  Transseptal transsphenoidal resection of pituitary tumor.  SURGEON:  [name] [name]. [name] [name], M.D.  [name] [name], M.D.  ANESTHESIA:  General endotracheal.  COMPLICATIONS:  None.  ESTIMATED BLOOD LOSS:  50 mL.  BRIEF CLINICAL NOTE:  [name] [name] is a 48 year old gentleman with history of obstructive sleep apnea, chronic MRSA abdominal wall infection as well as COPD.  He has had recent visual changes and a CT scan of the head shows a large pituitary mass with some erosion of the clivus extends into the sphenoid sinus.  Because of visual changes, he was taken to the operating room semiemergently for resection of pituitary tumor.  DESCRIPTION OF PROCEDURE:  The patient was brought from the neuro ICU to the operating room by Neuro OR.  He was allowed supine and underwent general endotracheal anesthesia.  Nose was then further prepped with Betadine solution.  Then, the nose and the face were draped.  The nose was then further prepped with cotton pledgets soaked in Afrin, as well as injected with Xylocaine with epinephrine for local hemostasis. First, an extended trans incision was made along the caudal edge of septum extended down to the floor of the nose.  The mucoperichondrial and mucoperiosteal flaps were elevated off of the right side of the septum and along the floor of the nose.  Elevation was likewise carried around the floor of the left side of the nose about 2-2.5 cm posteriorly.  A vertical incision was made in the cartilaginous  septum just anterior to the bony septum and the anterior portion of the septum was dissected off of the maxillary crest and pushed into the left airway.  Then, posterior to this vertical incision of the cartilaginous septum, mucoperichondrial and mucoperiosteal flaps were elevated posteriorly all the way back to the space of the sphenoid sinus.  The sphenoid sinus was then entered with 4-mm osteotome.  The sphenoidotomy was enlarged with up-biting Kerrison forceps.  The sphenoid sinus was full of tissue.  Some of the tissue was removed and sent for frozen section.  The sella was then identified.  The bone was removed from the sella and a little bone that was there and the pituitary gland was entered.  At this point, Dr. Joya [name] removed the pituitary tumor. Following completion of removal of the pituitary tumor by Dr. Joya [name], I was called back in for closure.  There is really not that much bleeding during the case.  The cartilaginous septum was placed back in maxillary crest back to midline.  The hemitransfixion incision was closed with interrupted 4-0 chromic sutures.  The septum was basted with a 3-0 chromic suture.  Splints were secured to either side of the septum with a 3-0 nylon suture.  Merocel packs were then placed in either side  of the septum in the nasal cavity and hydrated with saline soaked in bacitracin ointment.  The patient was subsequently awoke from anesthesia and transferred back to the Neuro ICU postoperatively.  DISPOSITION:  We will plan on removing his nasal packs in 2 or 3 days, and our plan on removing the septal splints in 7-10 days.          ______________________________ [name] [name] [name] [name], M.D.     CEN/MEDQ  D:  12/10/2013  T:  12/11/2013  Job:  [id number]  cc:   Leeroy [name], M.D.

## 2013-12-12 NOTE — Progress Notes (Signed)
POD 2 AF VSS Awake alert. Improved vision left eye. Right eye apoptotic and decrease mobility Nasal packing removed with minimal bleeding and no significant drainage noted. Will plan on removing septal splints in 5-6 days.

## 2013-12-12 NOTE — Progress Notes (Signed)
Occupational Therapy Treatment Patient Details Name: [name] [name] MRN: [id number] DOB: 11-Dec-1965 Today's Date: 12/12/2013    History of present illness HPI: 48 y/o M with a PMH of HTN, dCHF, OSA who presented to [facility] ER with 2 day hx of HA, blurry vision, & 1 episode of dark emesis (? If blood). CT evaluation noted sellar & suprasellar mass lesion. Developed hypercarbic respiratory failure in ER requiring bipap. On 7/8, taken to OR for transphenoidal resection of pituitary tumor. Returned to ICU on vent for ~1day.  Patient now off vent and therapy orders received.   OT comments  Pt seen this pm for "spot occlusion" of R lens to decrease symptoms of diplopia while allowing use of peripheral visual field and facilitate use of weak R eye. Pt appeared to demonstrate understanding of technique. Continue to recommend CIR. Will follow.   Follow Up Recommendations  CIR;Supervision/Assistance - 24 hour    Equipment Recommendations  Other (comment)    Recommendations for Other Services Rehab consult    Precautions / Restrictions Precautions Precautions: Fall;Other (comment) Precaution Comments: wound vac       Mobility   Balance    ADL      Vision Eye Alignment: Impaired (comment) Alignment/Gaze Preference:  (no preference for head position) Ocular Range of Motion: Restricted on the left;Restricted looking up;Restricted looking down Tracking/Visual Pursuits: Right eye does not track medially;Decreased smoothness of eye movement to LEFT inferior field;Decreased smoothness of eye movement to LEFT superior field;Impaired - to be further tested in functional context Saccades: Decreased speed of saccadic movement;Impaired - to be further tested in functional context Convergence: Impaired (comment) Diplopia Assessment: Disappears with one eye closed;Objects split side to side Depth Perception: Undershoots Additional Comments: R lense taped to decrease symptoms of diplopia and facilitate  strengthening R eye. Reviewed gaze stabilization exercises   Perception     Praxis      Cognition   Behavior During Therapy: WFL for tasks assessed/performed Overall Cognitive Status: No family/caregiver present to determine baseline cognitive functioning (appears [facility])                       Extremity/Trunk Assessment    Exercises Other Exercises Other Exercises: Pt educated on activities to encourage medial movement of R eye -gaze stabilization with L eye covered Given theraband for general UB strengthening   Shoulder Instructions       General Comments      Pertinent Vitals/ Pain       VSS. No c/o pain  Home Living Family/patient expects to be discharged to:: Private residence Living Arrangements: Parent Available Help at Discharge: Family;Available 24 hours/day Type of Home: House Home Access: Stairs to enter [employer] of Steps: 6 Entrance Stairs-Rails: Right Home Layout: One level     Bathroom Shower/Tub: Teacher, early years/pre: Standard Bathroom Accessibility: Yes How Accessible: Accessible via walker Home Equipment: [place] - 2 wheels;Wheelchair - [employer];Hospital bed (working with MD on trying to get power chair)   Additional Comments: Has ordered powerred w/c      Prior Functioning/Environment Level of Independence: Independent with assistive device(s)        Comments: ; currently has wound vac R lateral lower abdomen. States he has had vac since January.; wound carea nurse comes out to house 2 days/week. therapists hadjust D/C ed pt.    Frequency Min 3X/week     Progress Toward Goals  OT Goals(current goals can now be found in the care  plan section)  Progress towards OT goals: Progressing toward goals  Acute Rehab OT Goals Patient Stated Goal: to be independent and not live with my parents OT Goal Formulation: With patient Time For Goal Achievement: 12/26/13 Potential to Achieve Goals: Good ADL  Goals Pt Will Perform Lower Body Dressing: with min assist;with adaptive equipment;sit to/from stand Pt Will Transfer to Toilet: with min assist;bedside commode;stand pivot transfer Pt Will Perform Toileting - Clothing Manipulation and hygiene: with min assist;with adaptive equipment;sit to/from stand;sitting/lateral leans Pt/caregiver will Perform Home Exercise Program: Both right and left upper extremity;With theraband Additional ADL Goal #1: Pt will verbalize understanding of use of "spot occlusion" to reduce symptoms of diplopia to increase independence with ADL/mobility  Plan Discharge plan remains appropriate    Co-evaluation    PT/OT/SLP Co-Evaluation/Treatment: Yes Reason for Co-Treatment: Complexity of the patient's impairments (multi-system involvement);For patient/therapist safety          End of Session Equipment Utilized During Treatment: Gait belt;Rolling walker;Other (comment) (wound vac)   Activity Tolerance Patient tolerated treatment well   Patient Left in bed;with call bell/phone within reach   Nurse Communication Mobility status (Pt apparently used slide technique to get pt back into bed)        Time: 4801-6553 OT Time Calculation (min): 13 min  Charges: OT General Charges $OT Visit: 1 Procedure OT Evaluation $Initial OT Evaluation Tier I: 1 Procedure OT Treatments $Self Care/Home Management : 8-22 mins  Elia Keenum,HILLARY 12/12/2013, 5:09 PM   [facility], OTR/L  [phone] 12/12/2013

## 2013-12-12 NOTE — Consult Note (Signed)
WOC wound follow up Wound type: surgical wound with NPWT VAC dressing. Wound bed: clean, granulation tissue present. Visualized the base of the wound bed with flashlight Drainage (amount, consistency, odor) mild odor, but consistent with odor at the time of VAC dressing change. Once wound cleansed thoroughly no odor noted. Continues to have yeast like odor Periwound: improved, all satellite lesions have resolved. Still with some redness on the lateral dependent area of the abdomen Dressing procedure/placement/frequency: Injected black foam with 5cc Lidocaine prior to dressing removal  Removed old dressing, cleansed wound well with wound cleanser.  Cleansed periwound skin a and dried well. Crusted peri wound with antifungal powder and no sting skin prep.  Cut black foam in spiral and packed into the tunneled area then filled the remainder with the same black foam. Window framed the periwound to avoid contact from black foam on the periwound skin.  Skin protected for a bridge, bridged with extra strip of black foam. Seal at 180mmHG, Pt tolerated well, premedicated with PO pain meds prior to dressing change.  Canister changed on VAC also.  WOC will follow along with you for NPWT VAC dressing changes M/W/F  Para March RN,CWOCN [phone]

## 2013-12-12 NOTE — Progress Notes (Signed)
Speech Language Pathology Treatment: Dysphagia  Patient Details Name: [name] [name] MRN: [id number] DOB: [birth year] Today's Date: 12/12/2013 Time: 6568-1275 SLP Time Calculation (min): 10 min  Assessment / Plan / Recommendation Clinical Impression  F/U after yesterday's swallow assessment.  Pt consuming a regular diet with thin liquids; observed to swallow all consistencies safely; pt independent with basic precautions to monitor rate and take breaks when necessary.  No s/s of aspiration.  Continue current diet - no further SLP f/u warranted.  Will sign off.    HPI HPI: 48 y/o M with a PMH of HTN, dCHF, OSA who presented to [facility] ER with 2 day hx of HA, blurry vision, & 1 episode of dark emesis (? If blood). CT evaluation noted sellar & suprasellar mass lesion. Developed hypercarbic respiratory failure in ER requiring bipap. On 7/8, taken to OR for transphenoidal resection of pituitary tumor. Returned to ICU on vent for ~1day.  Patient now off vent; orders for bedside swallow received.        SLP Plan  All goals met    Recommendations Diet recommendations: Regular;Thin liquid Liquids provided via: Straw Medication Administration: Whole meds with liquid Supervision: Patient able to self feed Compensations: Slow rate;Small sips/bites              Oral Care Recommendations: Oral care BID Follow up Recommendations: None Plan: All goals met    Samanatha Brammer L. Tivis Ringer, [place] CCC/SLP Pager [phone]      [name] [name] 12/12/2013, 10:34 AM

## 2013-12-12 NOTE — Progress Notes (Signed)
Rehab Admissions Coordinator Note:  Patient was screened by Retta Diones for appropriateness for an Inpatient Acute Rehab Consult.  At this time, we are recommending Inpatient Rehab consult.  Jodell Cipro M 12/12/2013, 3:05 PM  I can be reached at [phone].

## 2013-12-12 NOTE — Progress Notes (Signed)
Physical Therapy Evaluation Patient Details Name: Gamal Todisco MRN: [id number] DOB: 09/26/1965 Today's Date: 12/12/2013   History of Present Illness  HPI: 48 y/o M with a PMH of HTN, dCHF, OSA who presented to [facility] ER with 2 day hx of HA, blurry vision, & 1 episode of dark emesis (? If blood). CT evaluation noted sellar & suprasellar mass lesion. Developed hypercarbic respiratory failure in ER requiring bipap. On 7/8, taken to OR for transphenoidal resection of pituitary tumor. Returned to ICU on vent for ~1day.  Patient now off vent and therapy orders received.  Clinical Impression  Patient demonstrates deficits in functional mobility as indicated below. Will benefit from continued skilled PT to address deficits and maximize function. Will see as indicated and progress as tolerated. Feel patient would truly benefit from CIR upon acute discharge to improve independence with mobility and transfers in order to facilitate return home with his family.    Follow Up Recommendations CIR    Equipment Recommendations  Other (comment) (tbd)    Recommendations for Other Services Rehab consult       12/12/13 1100  PT Visit Information  Last PT Received On 12/12/13  Assistance Needed +2 (+3 for managing lines and equipment)  PT/OT/SLP Co-Evaluation/Treatment Yes  Reason for Co-Treatment For patient/therapist safety  PT goals addressed during session Mobility/safety with mobility;Balance  History of Present Illness HPI: 48 y/o M with a PMH of HTN, dCHF, OSA who presented to [facility] ER with 2 day hx of HA, blurry vision, & 1 episode of dark emesis (? If blood). CT evaluation noted sellar & suprasellar mass lesion. Developed hypercarbic respiratory failure in ER requiring bipap. On 7/8, taken to OR for transphenoidal resection of pituitary tumor. Returned to ICU on vent for ~1day.  Patient now off vent and therapy orders received.  Precautions  Precautions Fall;Other (comment)  Precaution Comments wound  vac  Restrictions  Weight Bearing Restrictions No  Home Living  Family/patient expects to be discharged to: Private residence  Living Arrangements Parent  Available Help at Discharge Family;Available 24 hours/day  Type of Home House  Home Access Stairs to enter  Entrance Stairs-Number of Steps 6  Entrance Stairs-Rails Right  Home Layout One level  [place] - 2 wheels;Wheelchair - manual;BSC;Hospital bed (working with MD on trying to get power chair)  Additional Comments Has ordered powerred w/c  Prior Function  Level of Independence Independent with assistive device(s)  Comments ; currently has wound vac R lateral lower abdomen. States he has had vac since January.; wound carea nurse comes out to house 2 days/week. therapists hadjust D/C ed pt.   Communication  Communication No difficulties  Cognition  Arousal/Alertness Awake/alert  Behavior During Therapy WFL for tasks assessed/performed  Overall Cognitive Status No family/caregiver present to determine baseline cognitive functioning  Lower Extremity Assessment  Lower Extremity Assessment Generalized weakness;RLE deficits/detail;LLE deficits/detail  RLE Unable to fully assess due to pain  LLE Unable to fully assess due to pain  Cervical / Trunk Assessment  Cervical / Trunk Assessment (increased body habitus)  Bed Mobility  Overal bed mobility Needs Assistance  Bed Mobility Rolling;Sidelying to Sit  Rolling Min assist;+2 for physical assistance;+2 for safety/equipment  Sidelying to sit Min assist  General bed mobility comments Assist to help rotate trunk and hips to EOB  Transfers  Overall transfer level Needs assistance  Equipment used Rolling walker (2 wheeled)  Transfers Sit to/from [employer] Transfers  Sit to Stand Mod assist;+2 physical assistance;+2 safety/equipment  Stand pivot transfers Mod assist;+2 physical assistance;+2 safety/equipment  General transfer comment assist to power up to standing,  Cues to breathe and come to upright posture and position  Ambulation/Gait  Ambulation/Gait assistance Mod assist;+2 physical assistance;+2 safety/equipment  Ambulation Distance (Feet) 4 Feet  Assistive device Rolling walker (2 wheeled)  Gait Pattern/deviations Step-to pattern;Shuffle  General Gait Details pivotal steps to chair  Balance  Overall balance assessment Needs assistance  Sitting-balance support Feet supported  Sitting balance-Leahy Scale Good  Sitting balance - Comments tolerated sitting EOB >20 minutes static and dynamic movements tolerated in sitting EOB  Standing balance support Bilateral upper extremity supported;During functional activity  Standing balance-Leahy Scale Fair  General Comments  General comments (skin integrity, edema, etc.) wound Vac, abdominal RLQ  PT - End of Session  Equipment Utilized During Treatment Gait belt  Activity Tolerance Patient tolerated treatment well;Patient limited by fatigue;Patient limited by pain  Patient left in chair;with call bell/phone within reach (bari chair)  Nurse Communication Mobility status  PT Assessment  PT Recommendation/Assessment Patient needs continued PT services  PT Problem List Decreased strength;Decreased range of motion;Decreased activity tolerance;Decreased balance;Decreased mobility;Cardiopulmonary status limiting activity;Obesity;Decreased skin integrity;Pain  PT Therapy Diagnosis  Difficulty walking;Abnormality of gait;Generalized weakness;Acute pain  PT Plan  PT Frequency Min 3X/week  PT Treatment/Interventions DME instruction;Gait training;Stair training;Functional mobility training;Therapeutic activities;Therapeutic exercise;Balance training;Patient/family education  PT Recommendation  Recommendations for Other Services Rehab consult  Follow Up Recommendations CIR  PT equipment Other (comment) (tbd)  Individuals Consulted  Consulted and Agree with Results and Recommendations Patient  Acute Rehab PT  Goals  Patient Stated Goal to be able to walk again  PT Goal Formulation With patient  Time For Goal Achievement 12/26/13  Potential to Achieve Goals Fair  PT Time Calculation  PT Start Time 1128  PT Stop Time 1236  PT Time Calculation (min) 68 min  PT General Charges  $$ ACUTE PT VISIT 1 Procedure  PT Evaluation  $Initial PT Evaluation Tier I 1 Procedure  PT Treatments  $Therapeutic Activity 23-37 mins  Written Expression  Dominant Hand Right    Alben Deeds, PT DPT  [phone]

## 2013-12-12 NOTE — Progress Notes (Signed)
RT spoke with patient about wearing the BiPAP tonight; the patient stated that he just had his packing from his nose removed. He stated that he would like to try not to wear it tonight as he feels it is too soon. RT advised the patient that he should continue to wear the face tent and if he changes his mind or the need for BiPAP comes up respiratory would apply. RT will continue to monitor.

## 2013-12-13 ENCOUNTER — Inpatient Hospital Stay (HOSPITAL_COMMUNITY): Payer: BC Managed Care – PPO

## 2013-12-13 LAB — CBC
HCT: 40.4 % (ref 39.0–52.0)
Hemoglobin: 12.4 g/dL — ABNORMAL LOW (ref 13.0–17.0)
MCH: 27.4 pg (ref 26.0–34.0)
MCHC: 30.7 g/dL (ref 30.0–36.0)
MCV: 89.2 fL (ref 78.0–100.0)
Platelets: 123 10*3/uL — ABNORMAL LOW (ref 150–400)
RBC: 4.53 MIL/uL (ref 4.22–5.81)
RDW: 18.3 % — ABNORMAL HIGH (ref 11.5–15.5)
WBC: 6.9 10*3/uL (ref 4.0–10.5)

## 2013-12-13 LAB — BASIC METABOLIC PANEL
Anion gap: 10 (ref 5–15)
BUN: 16 mg/dL (ref 6–23)
CO2: 36 mEq/L — ABNORMAL HIGH (ref 19–32)
Calcium: 8.6 mg/dL (ref 8.4–10.5)
Chloride: 97 mEq/L (ref 96–112)
Creatinine, Ser: 0.81 mg/dL (ref 0.50–1.35)
GFR calc Af Amer: >90 mL/min (ref >90)
GFR calc non Af Amer: >90 mL/min (ref >90)
Glucose, Bld: 98 mg/dL (ref 70–99)
Potassium: 3.3 mEq/L — ABNORMAL LOW (ref 3.7–5.3)
Sodium: 143 mEq/L (ref 137–147)

## 2013-12-13 LAB — PHOSPHORUS: Phosphorus: 2.5 mg/dL (ref 2.3–4.6)

## 2013-12-13 LAB — BLOOD GAS, ARTERIAL
Acid-Base Excess: 11.3 mmol/L — ABNORMAL HIGH (ref 0.0–2.0)
Bicarbonate: 36.3 mEq/L — ABNORMAL HIGH (ref 20.0–24.0)
Delivery systems: POSITIVE
Drawn by: 252031
Expiratory PAP: 5
FIO2: 30 %
Inspiratory PAP: 12
Mode: POSITIVE
O2 Saturation: 96.9 %
Patient temperature: 98.6
RATE: 12 resp/min
TCO2: 38.1 mmol/L (ref 0–100)
pCO2 arterial: 57.7 mmHg (ref 35.0–45.0)
pH, Arterial: 7.415 (ref 7.350–7.450)
pO2, Arterial: 94 mmHg (ref 80.0–100.0)

## 2013-12-13 LAB — MAGNESIUM: Magnesium: 2 mg/dL (ref 1.5–2.5)

## 2013-12-13 MED ORDER — POTASSIUM CHLORIDE CRYS ER 20 MEQ PO TBCR
40.0000 meq | EXTENDED_RELEASE_TABLET | Freq: Once | ORAL | Status: AC
  Administered 2013-12-13: 40 meq via ORAL
  Filled 2013-12-13 (×2): qty 2

## 2013-12-13 MED ORDER — PANTOPRAZOLE SODIUM 40 MG PO TBEC
40.0000 mg | DELAYED_RELEASE_TABLET | Freq: Every day | ORAL | Status: DC
  Administered 2013-12-13 – 2013-12-16 (×4): 40 mg via ORAL
  Filled 2013-12-13 (×3): qty 1

## 2013-12-13 MED ORDER — FENTANYL CITRATE 0.05 MG/ML IJ SOLN
25.0000 ug | INTRAMUSCULAR | Status: DC | PRN
  Administered 2013-12-17 – 2013-12-20 (×9): 50 ug via INTRAVENOUS
  Filled 2013-12-13 (×9): qty 2

## 2013-12-13 MED ORDER — OXYCODONE-ACETAMINOPHEN 5-325 MG PO TABS
1.0000 | ORAL_TABLET | ORAL | Status: DC | PRN
  Administered 2013-12-13 – 2013-12-20 (×25): 1 via ORAL
  Filled 2013-12-13 (×25): qty 1

## 2013-12-13 MED ORDER — CITALOPRAM HYDROBROMIDE 40 MG PO TABS
40.0000 mg | ORAL_TABLET | Freq: Every day | ORAL | Status: DC
  Administered 2013-12-13 – 2013-12-20 (×8): 40 mg via ORAL
  Filled 2013-12-13 (×8): qty 1

## 2013-12-13 MED ORDER — FENTANYL CITRATE 0.05 MG/ML IJ SOLN: 12.5000 ug | INTRAMUSCULAR | Status: DC | PRN

## 2013-12-13 NOTE — Progress Notes (Signed)
CRITICAL VALUE ALERT  Critical value received: ABG: CO2 57.7   Date of notification:  12/13/13  Time of notification:  4935  Critical value read back:Yes.    Nurse who received alert:  Tawni Carnes, RN  MD notified (1st page):  Deterding, MD  Time of first page:  [phone]  MD notified (2nd page):  Time of second page:  Responding MD: Deterding, MD Time MD responded:  [phone]

## 2013-12-13 NOTE — Progress Notes (Signed)
PULMONARY / CRITICAL CARE MEDICINE   Name: [name] [name] MRN: [id number] DOB: 11-Dec-1965    ADMISSION DATE:  12/06/2013  REFERRING MD :  Dr. Doy Mince PRIMARY SERVICE: PCCM   CHIEF COMPLAINT:  Hypercarbic Respiratory Failure   BRIEF PATIENT DESCRIPTION: 48 y/o M with a PMH of HTN, dCHF, OSA who presented to [facility] ER with 2 day hx of HA, blurry vision, & 1 episode of dark emesis (? If blood).  CT evaluation noted sellar & suprasellar mass lesion.  Developed hypercarbic respiratory failure in ER requiring bipap.  PCCM consulted for ICU admission.   SIGNIFICANT EVENTS: 7/04 - Admit with HA, blurry vision, vomiting (dark emesis, ? blood). CT positive for suprasellar mass 7/5 BIPAP overnight 7/6 significant AMS, improving with BiPAP 7/8 To the OR for sellar mass, remained intubated overnight 7/9 Extubated, no BiPAP due to nasal packing  STUDIES:  7/04 - CT head >> prominent, large sellar & suprasellar mass lesion with extension into the R cavernous sinus, ssuperior extension beyond the diaphragm sella. Osseous destruction with extension into sphenoid sinus.  LINES / TUBES: R PICC line 7/6>>> ETT 7/8>>>7/9  CULTURES: Blood 7/7>>>  ANTIBIOTICS: Diflucan (yeast RLQ wound) 7/4>>> Vancomycin 7/7>>>7/9  SUBJECTIVE:  Extubated but nasal packing in place and desaturation overnight.  VITAL SIGNS: Temp:  [97.4 F (36.3 C)-98.1 F (36.7 C)] 97.4 F (36.3 C) (07/11 0820) Pulse Rate:  [68-100] 82 (07/11 0900) Resp:  [13-23] 14 (07/11 0900) BP: (130-162)/(72-102) 148/93 mmHg (07/11 0900) SpO2:  [90 %-100 %] 99 % (07/11 0900) FiO2 (%):  [28 %-30 %] 30 % (07/11 0356) Weight:  [208 kg (458 lb 8.9 oz)-209 kg (460 lb 12.2 oz)] 209 kg (460 lb 12.2 oz) (07/11 0500) HEMODYNAMICS:   VENTILATOR SETTINGS: Vent Mode:  [-] Other (Comment) FiO2 (%):  [28 %-30 %] 30 % Set Rate:  [12 bmp] 12 bmp PEEP:  [6 cmH20] 6 cmH20  INTAKE / OUTPUT: Intake/Output     07/10 0701 - 07/11 0700 07/11 0701 -  07/12 0700   P.O. 400    I.V. (mL/kg) 250 (1.2) 20 (0.1)   Other     IV Piggyback 560    Total Intake(mL/kg) 1210 (5.8) 20 (0.1)   Urine (mL/kg/hr) 2480 (0.5) 310 (0.5)   Drains     Total Output 2480 310   Net -1270 -290         PHYSICAL EXAMINATION:   Gen: morbidly obese HEENT: NCAT, right eye ptosis but pupils are reactive PULM: Distant BS diffusely CV: RRR, no mgr, no JVD AB: BS+, soft, nontender, no hsm Ext: warm, 1+ leg edema, no clubbing, no cyanosis Derm: R abdominal wound vac Neuro: Awake, anxious, moving all ext to command, oriented X 4   LABS:  CBC  Recent Labs Lab 12/11/13 0454 12/12/13 0350 12/13/13 0545  WBC 8.6 7.6 6.9  HGB 12.7* 12.2* 12.4*  HCT 41.8 39.8 40.4  PLT 111* 121* 123*   Coag's No results found for this basename: APTT, INR,  in the last 168 hours BMET  Recent Labs Lab 12/11/13 0454 12/12/13 0350 12/13/13 0545  NA 140 143 143  K 3.4* 3.0* 3.3*  CL 94* 97 97  CO2 34* 35* 36*  BUN 14 10 16   CREATININE 1.03 0.79 0.81  GLUCOSE 112* 120* 98   Electrolytes  Recent Labs Lab 12/11/13 0454 12/12/13 0350 12/13/13 0545  CALCIUM 8.5 8.4 8.6  MG 1.8 1.8 2.0  PHOS 2.5 2.3 2.5   Sepsis Markers No results  found for this basename: LATICACIDVEN, PROCALCITON, O2SATVEN,  in the last 168 hours ABG  Recent Labs Lab 12/06/13 1012 12/10/13 2250 12/13/13 0500  PHART 7.256* 7.488* 7.415  PCO2ART 76.9* 47.6* 57.7*  PO2ART 118.0* 67.0* 94.0   Liver Enzymes No results found for this basename: AST, ALT, ALKPHOS, BILITOT, ALBUMIN,  in the last 168 hours Cardiac Enzymes No results found for this basename: TROPONINI, PROBNP,  in the last 168 hours Glucose  Recent Labs Lab 12/06/13 2023  GLUCAP 70*   Imaging  ASSESSMENT / PLAN:   S/p transsphenoidal resection for pituitary tumor.  Ptosis of R Eye  Depression / Anxiety Peripheral Neuropathy  Plan:    -cont post op care per neuro-surg  - Continue home wellbutrin,  gabapentin.  - See endocrine for w/u.  Acute on Chronic Hypercarbic Respiratory Failure > due to OSA / OHS c/b post-op narcotics  Plan:    - nocturnal BIPAP  - will see if we can get him auto-titrate at d/c as has a in-home PSG pending.   - OOB to chair.  -minimize sedating drugs   Hypertension  Bradycardia  dCHF  Plan:   - Continue home meds:  Lasix, coreg.  - Continue norvasc 5 mg PO daily with holding parameters.  Hypokalemia Plan  Replace/recheck   Morbid Obesity  Hematemesis - 1 episode, on NSAID at baseline.  No further bleeding  Plan:    - dat  - PPI.   Mild Thrombocytopenia  Plan:   - DVT:  SQ Heparin.   RLQ Chronic Wound VAC Yeast RLQ  Plan:    - Monitor fever curve/leukocytosis.  - Diflucan for candida at wound site per ID's recommendations.  - Continue topical anti-fungal.  - WOC following for [facility] assessment & changes.  Changed at home M/W/F  - Appreciate input from surgery and ID.  - Vanc d/ced per IDs recommendations.  Sellar & Suprasellar Mass Hyperglycemia   Plan:    - Needs endocrine f/u post op by a few days.  - Cortisol level 21.1, will repeat in a few days or if hypotensive.  - SSI.  Looks good. Can go to SDU setting. We will transfer to triad. Will stay on as a consult for pulmonary. Will see again in 7/13. Call PRN if needed before   12/13/2013, 9:58 AM  Attending:  I have seen and examined the patient with nurse practitioner/resident and agree with the note above.   Roselie Awkward, MD [place] [facility] Pager: [phone] Cell: [phone] If no response, call [phone]

## 2013-12-13 NOTE — Plan of Care (Signed)
Problem: Phase II Progression Outcomes Goal: Extubated and maintains O2 sats > 92% Outcome: Completed/Met Date Met:  12/13/13 Pt progressed to nasal cannula

## 2013-12-13 NOTE — Progress Notes (Signed)
Subjective: Patient resting comfortably in bed. Being seen by PT and OT.  Objective: Vital signs in last 24 hours: Filed Vitals:   12/13/13 0700 12/13/13 0800 12/13/13 0820 12/13/13 0823  BP: 135/87 152/87  146/92  Pulse: 69 75  73  Temp:   97.4 F (36.3 C)   TempSrc:   Oral   Resp:    16  Height:      Weight:      SpO2: 94% 98%  96%    Intake/Output from previous day: 07/10 0701 - 07/11 0700 In: 1210 [P.O.:400; I.V.:250; IV Piggyback:560] Out: 2480 [Urine:2480] Intake/Output this shift: Total I/O In: 10 [I.V.:10] Out: 310 [Urine:310]  Physical Exam:  Awake and alert, oriented. Following commands. Moving all 4 extremities. Mild right ptosis.  CBC  Recent Labs  12/12/13 0350 12/13/13 0545  WBC 7.6 6.9  HGB 12.2* 12.4*  HCT 39.8 40.4  PLT 121* 123*   BMET  Recent Labs  12/12/13 0350 12/13/13 0545  NA 143 143  K 3.0* 3.3*  CL 97 97  CO2 35* 36*  GLUCOSE 120* 98  BUN 10 16  CREATININE 0.79 0.81  CALCIUM 8.4 8.6   ABG    Component Value Date/Time   PHART 7.415 12/13/2013 0500   PCO2ART 57.7* 12/13/2013 0500   PO2ART 94.0 12/13/2013 0500   HCO3 36.3* 12/13/2013 0500   TCO2 38.1 12/13/2013 0500   O2SAT 96.9 12/13/2013 0500    Assessment/Plan: Stable following transsphenoidal resection of pituitary tumor by Dr. Joya Salm, who will follow up on him early next week.   Hosie Spangle, MD 12/13/2013, 9:05 AM

## 2013-12-13 NOTE — Progress Notes (Signed)
Occupational Therapy Treatment Patient Details Name: [name] [name] MRN: [id number] DOB: 01-16-1966 Today's Date: 12/13/2013    History of present illness HPI: 48 y/o M with a PMH of HTN, dCHF, OSA who presented to [facility] ER with 2 day hx of HA, blurry vision, & 1 episode of dark emesis (? If blood). CT evaluation noted sellar & suprasellar mass lesion. Developed hypercarbic respiratory failure in ER requiring bipap. On 7/8, taken to OR for transphenoidal resection of pituitary tumor. Returned to ICU on vent for ~1day.  Patient now off vent and therapy orders received.   OT comments  Pt able to demonstrate eye exercises for improving gaze stabilization and medial tracking of R eye, but required verbal reminder to occlude L eye simultaneously.  Pt is faithful to use of glasses with spot occlusion.  Report he is able to watch TV with them on.  Fatigues easily with vision activities. Pt remains highly motivated to return to prior functional status.  Follow Up Recommendations  CIR;Supervision/Assistance - 24 hour    Equipment Recommendations       Recommendations for Other Services      Precautions / Restrictions Precautions Precautions: Fall Precaution Comments: wound vac       Mobility Bed Mobility                  Transfers                      Balance                                   ADL                                                Vision       Tracking/Visual Pursuits: Right eye does not track medially;Decreased smoothness of eye movement to LEFT inferior field;Decreased smoothness of eye movement to LEFT superior field Saccades: Decreased speed of saccadic movement Convergence: Impaired (comment) Diplopia Assessment: Disappears with one eye closed;Objects split side to side       Perception     Praxis      Cognition   Behavior During Therapy: WFL for tasks assessed/performed Overall Cognitive Status: No  family/caregiver present to determine baseline cognitive functioning       Memory: Decreased short-term memory (some difficulty recalling vision exercises from yesterday)               Extremity/Trunk Assessment               Exercises Other Exercises Other Exercises: Reviewed activities to encourage medial movement of R eye -gaze stabilization with L eye covered.  Pt able to recall all except the importance of covering L eye. Other Exercises: Level 3 theraband x 10 B UE shoulder and elbow.   Shoulder Instructions       General Comments      Pertinent Vitals/ Pain       No pain reported with activities this session, VSS  Home Living                                          Prior Functioning/Environment  Frequency Min 3X/week     Progress Toward Goals  OT Goals(current goals can now be found in the care plan section)  Progress towards OT goals: Progressing toward goals  Acute Rehab OT Goals Time For Goal Achievement: 12/26/13 Potential to Achieve Goals: Good  Plan Discharge plan remains appropriate    Co-evaluation                 End of Session     Activity Tolerance Patient tolerated treatment well   Patient Left in bed;with call bell/phone within reach;with family/visitor present   Nurse Communication  (IV alarm)        Time: 1500-1520 OT Time Calculation (min): 20 min  Charges: OT General Charges $OT Visit: 1 Procedure OT Treatments $Therapeutic Exercise: 8-22 mins  Malka So 12/13/2013, 3:26 PM [phone]

## 2013-12-14 MED ORDER — DICLOFENAC-MISOPROSTOL 75-0.2 MG PO TBEC
1.0000 | DELAYED_RELEASE_TABLET | Freq: Two times a day (BID) | ORAL | Status: DC
  Filled 2013-12-14 (×3): qty 1

## 2013-12-14 MED ORDER — CARVEDILOL 12.5 MG PO TABS: 12.5000 mg | ORAL_TABLET | Freq: Two times a day (BID) | ORAL | Status: DC

## 2013-12-14 MED ORDER — CARVEDILOL 6.25 MG PO TABS
6.2500 mg | ORAL_TABLET | Freq: Two times a day (BID) | ORAL | Status: DC
  Administered 2013-12-14 – 2013-12-20 (×12): 6.25 mg via ORAL
  Filled 2013-12-14 (×14): qty 1

## 2013-12-14 MED ORDER — MISOPROSTOL 200 MCG PO TABS
200.0000 ug | ORAL_TABLET | Freq: Two times a day (BID) | ORAL | Status: DC
  Administered 2013-12-14 – 2013-12-20 (×12): 200 ug via ORAL
  Filled 2013-12-14 (×13): qty 1

## 2013-12-14 MED ORDER — FUROSEMIDE 40 MG PO TABS
40.0000 mg | ORAL_TABLET | Freq: Every day | ORAL | Status: DC
  Administered 2013-12-15 – 2013-12-20 (×6): 40 mg via ORAL
  Filled 2013-12-14 (×6): qty 1

## 2013-12-14 MED ORDER — DICLOFENAC SODIUM 75 MG PO TBEC
75.0000 mg | DELAYED_RELEASE_TABLET | Freq: Two times a day (BID) | ORAL | Status: DC
  Administered 2013-12-14 – 2013-12-20 (×12): 75 mg via ORAL
  Filled 2013-12-14 (×13): qty 1

## 2013-12-14 MED ORDER — ADULT MULTIVITAMIN W/MINERALS CH
1.0000 | ORAL_TABLET | Freq: Every day | ORAL | Status: DC
  Administered 2013-12-14 – 2013-12-20 (×7): 1 via ORAL
  Filled 2013-12-14 (×7): qty 1

## 2013-12-14 MED ORDER — POTASSIUM CHLORIDE CRYS ER 20 MEQ PO TBCR
40.0000 meq | EXTENDED_RELEASE_TABLET | Freq: Once | ORAL | Status: AC
  Administered 2013-12-14: 40 meq via ORAL
  Filled 2013-12-14: qty 2

## 2013-12-14 NOTE — Progress Notes (Addendum)
[place] TEAM 1 - Stepdown/ICU TEAM Progress Note  [name] [name] OHY:[id number] DOB: 07-11-1965 DOA: 12/06/2013 PCP: [name] Pel, MD  Admit HPI / Brief Narrative: 48 y/o M with a PMH of HTN, dCHF, OSA who presented to [facility] ER with 2 day hx of HA, blurry vision, & 1 episode of dark emesis (? If blood). CT evaluation noted sellar & suprasellar mass lesion. Developed hypercarbic respiratory failure in ER requiring bipap. PCCM consulted for ICU admission.   Significant Events: 7/04 - Admit with HA, blurry vision, vomiting (dark emesis, ? blood). CT positive for suprasellar mass  7/5 BIPAP overnight  7/6 significant AMS, improved with BiPAP  7/8 To the OR for sellar mass, remained intubated overnight  7/9 Extubated, no BiPAP due to nasal packing 7/10 nasal packing removed per ENT 7/12 [facility] assumes care  HPI/Subjective: Pt c/o feeling stiffness and aching in his joints, sx he is usually able to manage with arthrotec.  He denies current sob or cp.  No HA.  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  Hematemesis  1 episode, on NSAID at baseline - No further bleeding - resume arthrotec and follow   Mild Thrombocytopenia  Stable   RLQ Chronic Wound VAC / Yeast RLQ   MRSA screen +  Code Status: FULL Family Communication: no family present at time of exam Disposition Plan:  SDU  Consultants: Neurosurgery ENT PCCM  Antibiotics: Diflucan (yeast RLQ wound) 7/4>>  Vancomycin 7/7>>7/8  DVT prophylaxis: SCDs  Objective: Blood pressure 159/98, pulse 74, temperature 97.7 F (36.5 C), temperature source Oral, resp. rate 15, height 5\' 11"  (1.803 m), weight 209 kg (460 lb 12.2 oz), SpO2 100.00%.  Intake/Output Summary (Last 24 hours) at 12/14/13 1558 Last data filed at 12/14/13 1300  Gross per 24 hour  Intake     10 ml  Output   2700 ml  Net  -2690 ml   Exam: General: No acute respiratory distress Lungs: Clear to auscultation bilaterally without wheezes or crackles but BS quite distant  Cardiovascular: Regular rate and rhythm without murmur gallop or rub normal S1 and S2 - heart sounds quite distant  Abdomen: Morbidly obese, nontender, nondistended, soft, bowel sounds positive, no rebound, no ascites, no appreciable mass Extremities: No significant cyanosis, clubbing; trace edema bilateral lower extremities  Data Reviewed: Basic Metabolic Panel:  Recent Labs Lab 12/09/13 0700 12/10/13 0425 12/11/13 0454 12/12/13 0350 12/13/13 0545  NA 140 141 140 143 143  K 4.4 3.7 3.4* 3.0* 3.3*  CL 95* 95* 94* 97 97  CO2 39* 39* 34* 35* 36*  GLUCOSE 92 101* 112* 120* 98  BUN 15 16 14 10 16   CREATININE 0.82 0.84 1.03 0.79 0.81  CALCIUM 9.0 8.9 8.5 8.4 8.6  MG 2.1 2.1 1.8 1.8 2.0  PHOS 2.0* 2.0* 2.5 2.3 2.5   Liver Function Tests: No results found for this basename: AST, ALT, ALKPHOS, BILITOT, PROT, ALBUMIN,  in the last 168 hours  CBC:  Recent Labs Lab 12/08/13 0310 12/09/13 0700 12/10/13 0425 12/11/13 0454 12/12/13 0350 12/13/13 0545  WBC 8.0 7.0 5.7 8.6 7.6 6.9  NEUTROABS 6.7  --   --   --   --   --   HGB 12.8* 12.7* 12.2* 12.7* 12.2* 12.4*  HCT 42.8 42.6 41.0 41.8 39.8 40.4  MCV 90.9 89.9 89.7 89.7 88.1 89.2  PLT 131* 120* 127* 111* 121* 123*    Recent Results (from the past 240 hour(s))  MRSA PCR SCREENING     Status: Abnormal    Collection Time    12/06/13  5:15 PM      Result Value Ref Range Status   MRSA by PCR POSITIVE (*) NEGATIVE Final   Comment:            The GeneXpert MRSA Assay (FDA     approved for NASAL specimens     only), is one component of a     comprehensive MRSA colonization     surveillance program. It is not     intended to diagnose MRSA     infection nor to guide or     monitor treatment for     MRSA infections.     RESULT CALLED TO, READ BACK BY AND VERIFIED WITH:     SMITH,A RN [id number] 12/06/13 MITCHELL,L  CULTURE, BLOOD (ROUTINE X 2)     Status: None   Collection Time    12/09/13 12:26 PM      Result Value Ref Range Status   Specimen Description BLOOD LEFT HAND   Final   Special Requests BOTTLES DRAWN AEROBIC ONLY 5CC   Final   Culture  Setup Time     Final   Value: 12/09/2013 16:48     Performed at [facility]   Culture     Final   Value:        BLOOD CULTURE RECEIVED NO GROWTH TO DATE CULTURE WILL BE HELD FOR 5 DAYS BEFORE ISSUING A FINAL NEGATIVE REPORT     Performed at [facility]   Report Status PENDING   Incomplete  CULTURE, BLOOD (ROUTINE X 2)     Status: None   Collection Time    12/09/13 12:40 PM      Result Value Ref Range Status   Specimen Description BLOOD BLOOD LEFT FOREARM   Final   Special Requests     Final   Value: BOTTLES DRAWN AEROBIC AND ANAEROBIC 10CC BLUE  5CC RED   Culture  Setup Time     Final   Value: 12/09/2013 16:49     Performed at [facility]   Culture     Final   Value:        BLOOD CULTURE RECEIVED NO GROWTH TO DATE CULTURE WILL BE HELD FOR 5 DAYS BEFORE ISSUING A FINAL NEGATIVE REPORT     Performed at [facility]   Report Status PENDING   Incomplete     Studies:  Recent x-ray studies have been reviewed in detail by the Attending Physician  Scheduled Meds:  Scheduled Meds: . antiseptic oral rinse  15 mL Mouth Rinse QID  . citalopram  40 mg Oral Daily  . feeding supplement (PRO-STAT SUGAR FREE 64)  30 mL  Oral TID WC  . fluconazole  100 mg Oral Daily  . furosemide  40 mg Per Tube Daily  . multivitamin with minerals  1 tablet Per Tube  Daily  . nutrition supplement (JUVEN)  1 packet Oral BID BM  . pantoprazole  40 mg Oral Daily  . sodium chloride  10-40 mL Intracatheter Q12H    Time spent on care of this patient: 35 mins   Hutton Pellicane T , MD   [facility] Office  [phone] Pager - Text Page per Shea Evans as per below:  On-Call/Text Page:      [email or web address]      password [email or web address]  If 7PM-7AM, please contact night-coverage [email or web address] Password [email or web address] 12/14/2013, 3:58 PM   LOS: 8 days

## 2013-12-14 NOTE — Progress Notes (Signed)
RT went to check to see if the patient was ready to wear the BiPAP and the patient was already wearing the Bipap.  The patient settings are IPAP 18 and EPAP 6 with 3 L O2 .  Patient is tolerating the Bipap at this time with no complication. RT will continue to monitor.

## 2013-12-15 DIAGNOSIS — G478 Other sleep disorders: Secondary | ICD-10-CM

## 2013-12-15 DIAGNOSIS — F32A Depression, unspecified: Secondary | ICD-10-CM | POA: Insufficient documentation

## 2013-12-15 DIAGNOSIS — M109 Gout, unspecified: Secondary | ICD-10-CM | POA: Insufficient documentation

## 2013-12-15 DIAGNOSIS — F329 Major depressive disorder, single episode, unspecified: Secondary | ICD-10-CM | POA: Insufficient documentation

## 2013-12-15 LAB — CULTURE, BLOOD (ROUTINE X 2)
Culture: NO GROWTH
Culture: NO GROWTH

## 2013-12-15 LAB — BASIC METABOLIC PANEL
Anion gap: 10 (ref 5–15)
BUN: 18 mg/dL (ref 6–23)
CO2: 34 mEq/L — ABNORMAL HIGH (ref 19–32)
Calcium: 8.7 mg/dL (ref 8.4–10.5)
Chloride: 99 mEq/L (ref 96–112)
Creatinine, Ser: 0.81 mg/dL (ref 0.50–1.35)
GFR calc Af Amer: >90 mL/min (ref >90)
GFR calc non Af Amer: >90 mL/min (ref >90)
Glucose, Bld: 104 mg/dL — ABNORMAL HIGH (ref 70–99)
Potassium: 3.8 mEq/L (ref 3.7–5.3)
Sodium: 143 mEq/L (ref 137–147)

## 2013-12-15 LAB — CBC
HCT: 42.8 % (ref 39.0–52.0)
Hemoglobin: 12.9 g/dL — ABNORMAL LOW (ref 13.0–17.0)
MCH: 26.9 pg (ref 26.0–34.0)
MCHC: 30.1 g/dL (ref 30.0–36.0)
MCV: 89.2 fL (ref 78.0–100.0)
Platelets: 128 10*3/uL — ABNORMAL LOW (ref 150–400)
RBC: 4.8 MIL/uL (ref 4.22–5.81)
RDW: 18.2 % — ABNORMAL HIGH (ref 11.5–15.5)
WBC: 6.5 10*3/uL (ref 4.0–10.5)

## 2013-12-15 LAB — HEMOGLOBIN A1C
Hgb A1c MFr Bld: 5.8 % — ABNORMAL HIGH (ref <5.7)
Mean Plasma Glucose: 120 mg/dL — ABNORMAL HIGH (ref <117)

## 2013-12-15 MED ORDER — LACTULOSE 10 GM/15ML PO SOLN
30.0000 g | Freq: Two times a day (BID) | ORAL | Status: DC | PRN
  Filled 2013-12-15: qty 45

## 2013-12-15 MED ORDER — MAGNESIUM CITRATE PO SOLN: 1.0000 | Freq: Every day | ORAL | Status: DC | PRN

## 2013-12-15 MED ORDER — ALTEPLASE 100 MG IV SOLR
2.0000 mg | Freq: Once | INTRAVENOUS | Status: AC
  Administered 2013-12-15: 2 mg
  Filled 2013-12-15: qty 2

## 2013-12-15 MED ORDER — SENNOSIDES-DOCUSATE SODIUM 8.6-50 MG PO TABS
1.0000 | ORAL_TABLET | Freq: Two times a day (BID) | ORAL | Status: DC
  Administered 2013-12-15 – 2013-12-17 (×4): 1 via ORAL
  Filled 2013-12-15 (×11): qty 1

## 2013-12-15 MED ORDER — ALTEPLASE 2 MG IJ SOLR
2.0000 mg | Freq: Once | INTRAMUSCULAR | Status: AC
  Administered 2013-12-15: 2 mg
  Filled 2013-12-15: qty 2

## 2013-12-15 MED ORDER — BISACODYL 10 MG RE SUPP: 10.0000 mg | Freq: Every day | RECTAL | Status: DC | PRN

## 2013-12-15 MED ORDER — POLYETHYLENE GLYCOL 3350 17 G PO PACK
17.0000 g | PACK | Freq: Every day | ORAL | Status: DC
  Administered 2013-12-15 – 2013-12-16 (×2): 17 g via ORAL
  Filled 2013-12-15 (×6): qty 1

## 2013-12-15 NOTE — Consult Note (Signed)
WOC wound follow up  Injected foam with 10cc of lidocaine prior to foam removal and pt premedicated per bedside RN Wound type: surgical/non healing; RLQ  Measurement: 11.5cm x 2.0cm x 5.5cm with 7.5cm tunneling tract at 2 o'clock  Wound PET:[id number] with home wound cleanser per pt request; clean; 100% beefy red with granulation  Drainage: mod in canister, pink, no odor Periwound: candida overgrowth has improved, less drape used, slight maceration below the wound, bridge relocate to off load and allow healing Dressing procedure/placement/frequency: 1pc of black granufoam cut into spiral and used to fill the 11.5cm tract and then to fill the remainder of the wound bed, window framed the wound with drape after application of no sting skin prep. Bridge towards the right side of umbilicus, and protected skin with skin prep. Pt tolerated procedure well. Education: Discussed dressing change frequency with patient to keep on M/W/F schedule to allow for WOC to complete dressing change. Pt was agreeable to this plan. WOC will follow d/t complexity of wound treatment. Larene Beach, BSN, RN, MSN-Student Julien Girt MSN, RN, [place], [place], [place]

## 2013-12-15 NOTE — Consult Note (Signed)
Physical Medicine and Rehabilitation Consult  Reason for Consult: Pituitary mass with diplopia, right ptosis, HA and weakness Referring Physician: Dr. Thereasa Solo   HPI: [name] [name] is a 48 y.o. male with history of HTN, OSA, diastolic CHF, morbid obesity, chronic abdominal wound with recent abdominal wall abscess s/p I and D 06/06/13 (with prolonged hospitalization and CIR stay 08/2013 in [place]) who was admitted on  12/06/13 with 2 day history of HA, blurry vision with  right ptosis, hematemesis, and hypercarbic respiratory failure.  He was transferred from [facility] to [facility] for management as CT head revealed prominent, large sellar & suprasellar mass lesion with extension into the R cavernous sinus, ssuperior extension beyond the diaphragm sella. Osseous destruction with extension into sphenoid sinus. He was placed on BIPAP and clear liquid diet. Dr. Doy Mince consulted for input and labs done revealing ACTH 135, FH <0.1, FSH < 0.3, TSH 0.137, Free T4- 0.81 and Cortisol level 31.2.  Dr. Joya Salm and [facility] ENT consulted for input on surgical intervention. Dr. Tommy Medal and Dr. Georgette Dover consulted for input on chronic abdominal wound and recommended continuing fluconazole and no need for antibiotics as no signs of infection noted.  To continue with VAC and cleared to proceed with surgery per [facility] input. He has had issues with anxiety requiring BIPAP for labored breathing. On 12/10/13, he underwent transphenoidal resection of pituitary tumor by Dr Radene Journey and Dr. Joya Salm. He was extubated on 07/09 and cleared for regular diet.  Last cortisol level remains elevated at 21.1 and Endocrine follow up recommended for input.  Patient continues to be limited by diplopia, generalized weakness, pain as well as body habitus. Therapy initiated and CIR recommended by rehab team.  Patient was independent for stand pivot transfer PTA. Was in process of getting motorized WC due to ortho (Drs Murphy/Olin) recommendations  to limit mobility to transfers only for now secondary to concerns of fall/fracture.  He was in process of losing weight in order to have B-TKR in the futures and has lost 60 lbs in past 6 months.   Review of Systems  Constitutional: Positive for malaise/fatigue.  HENT: Negative for hearing loss and tinnitus.   Eyes: Positive for blurred vision and double vision.  Respiratory: Positive for shortness of breath (due to nasal packing). Negative for cough and sputum production.   Cardiovascular: Negative for chest pain and palpitations.  Gastrointestinal: Positive for constipation. Negative for heartburn and nausea.  Genitourinary: Positive for urgency and frequency.  Musculoskeletal: Positive for joint pain.  Neurological: Positive for weakness and headaches. Negative for dizziness and tingling.  Psychiatric/Behavioral: Negative for depression and memory loss. The patient is nervous/anxious.     Past Medical History  Diagnosis Date  . Sleep apnea   . Allergic rhinitis   . Diastolic CHF   . Gout   . Nephrolithiasis   . Peripheral neuropathy   . Hypertension    Past Surgical History  Procedure Laterality Date  . Sigmoid colonoscopy    . Hernia repair    . Abdominal surgery      for cyst with MRSA  . Craniotomy N/A 12/10/2013    Procedure: Transpheniodal resection of Pituitary tumor with Dr. Radene Journey for approach;  Surgeon: Floyce Stakes, MD;  Location: [facility] NEURO ORS;  Service: Neurosurgery;  Laterality: N/A;  Transpheniodal resection of Pituitary tumor with Dr. Radene Journey for approach  . Transnasal approach N/A 12/10/2013    Procedure: TRANSNASAL APPROACH;  Surgeon: Rozetta Nunnery, MD;  Location: [facility] NEURO ORS;  Service: ENT;  Laterality: N/A;   Family History  Problem Relation Age of Onset  . Hypertension Mother   . Hypertension Father   . Gout Father   . Hypothyroidism Father   . Gout Brother    Social History:   Lives with parents. Moved from [place] 4 months ago--lost  his job due to current illness. He was independent for ADLs and transfers with walker PTA. He reports that he has never smoked. He has never used smokeless tobacco. He reports that he does not drink alcohol or use illicit drugs.   Allergies  Allergen Reactions  . Penicillins Anaphylaxis and Other (See Comments)    Unknown reaction  . Morphine And Related Hives and Itching    Medications Prior to Admission  Medication Sig Dispense Refill  . albuterol (PROAIR HFA) 108 (90 BASE) MCG/ACT inhaler Inhale 1-2 puffs into the lungs every 6 (six) hours as needed for wheezing or shortness of breath.  1 Inhaler  5  . allopurinol (ZYLOPRIM) 300 MG tablet Take 300 mg by mouth daily.      Marland Kitchen ALPRAZolam (XANAX) 0.25 MG tablet Take 0.25 mg by mouth daily as needed for anxiety.       . carvedilol (COREG) 25 MG tablet Take 25 mg by mouth 2 (two) times daily with a meal.       . citalopram (CELEXA) 40 MG tablet Take 40 mg by mouth daily.      . cyclobenzaprine (FLEXERIL) 10 MG tablet Take 10 mg by mouth daily as needed for muscle spasms.      . Diclofenac-Misoprostol (ARTHROTEC) 75-0.2 MG TBEC Take 1 tablet by mouth 2 (two) times daily.       . fluticasone (FLONASE) 50 MCG/ACT nasal spray Place 2 sprays into both nostrils daily as needed for allergies or rhinitis.      . Gabapentin, PHN, (GRALISE) 300 MG TABS Take 900 mg by mouth 3 (three) times daily.      Marland Kitchen HYDROcodone-acetaminophen (NORCO/VICODIN) 5-325 MG per tablet Take 1-2 tablets by mouth every 6 (six) hours as needed for moderate pain.       Marland Kitchen loratadine (CLARITIN) 10 MG tablet Take 10 mg by mouth daily as needed for allergies.      . Multiple Vitamin (MULTIVITAMIN) tablet Take 1 tablet by mouth daily. Centrum brand      . Nutritional Supplements (JUVEN PO) Take 1 packet by mouth 2 (two) times daily. Wound healing      . potassium chloride (K-DUR,KLOR-CON) 10 MEQ tablet Take 10 mEq by mouth 2 (two) times daily.      . Prenatal Vit-Fe Fumarate-FA  (PRENATAL PO) Take 1 tablet by mouth daily. For wound healing      . Probiotic Product (PROBIOTIC DAILY) CAPS Take 1 capsule by mouth daily.      . pseudoephedrine (SUDAFED) 120 MG 12 hr tablet Take 120 mg by mouth daily as needed for congestion.      . vitamin C (ASCORBIC ACID) 500 MG tablet Take 500 mg by mouth daily.        Home: Home Living Family/patient expects to be discharged to:: Private residence Living Arrangements: Parent Available Help at Discharge: Family;Available 24 hours/day Type of Home: House Home Access: Stairs to enter [employer] of Steps: 6 Entrance Stairs-Rails: Right Home Layout: One level Home Equipment: Walker - 2 wheels;Wheelchair - [employer];Hospital bed (working with MD on trying to get power chair) Additional Comments: Has ordered powerred  w/c  Functional History: Prior Function Level of Independence: Independent with assistive device(s) Comments: ; currently has wound vac R lateral lower abdomen. States he has had vac since January.; wound carea nurse comes out to house 2 days/week. therapists hadjust D/C ed pt.  Functional Status:  Mobility: Bed Mobility Overal bed mobility: Needs Assistance Bed Mobility: Rolling;Sidelying to Sit Rolling: Min assist;+2 for physical assistance;+2 for safety/equipment Sidelying to sit: Min assist General bed mobility comments: Assist to help rotate trunk and hips to EOB Transfers Overall transfer level: Needs assistance Equipment used: Rolling walker (2 wheeled) Transfers: Sit to/from Stand Sit to Stand: Mod assist;+2 physical assistance;+2 safety/equipment Stand pivot transfers: Mod assist;+2 physical assistance;+2 safety/equipment General transfer comment: assist to power up to standing, Cues to breathe and come to upright posture and position Ambulation/Gait Ambulation/Gait assistance: Mod assist;+2 physical assistance;+2 safety/equipment Ambulation Distance (Feet): 4 Feet Assistive  device: Rolling walker (2 wheeled) Gait Pattern/deviations: Step-to pattern;Shuffle General Gait Details: pivotal steps to chair    ADL: ADL Overall ADL's : Needs assistance/impaired Eating/Feeding: Modified independent (liquid diet) Grooming: Set up;Sitting Upper Body Bathing: Minimal assitance;Sitting Lower Body Bathing: Maximal assistance;Sit to/from stand Upper Body Dressing : Moderate assistance;Sitting Lower Body Dressing: Sit to/from stand;Total assistance (without AE) Toilet Transfer: +2 for physical assistance;Moderate assistance (simulated to recliner) Toileting- Clothing Manipulation and Hygiene: Total assistance Toileting - Clothing Manipulation Details (indicate cue type and reason): foley Functional mobility during ADLs: +2 for physical assistance;Moderate assistance;Rolling walker (bariatric) General ADL Comments: At baseline pt uses AE for LB ADL (reacher and sock aid). Pt states he has ordered a reacher. Discussed benefits of using a toilet aid.  Cognition: Cognition Overall Cognitive Status: No family/caregiver present to determine baseline cognitive functioning Orientation Level: Oriented X4 Cognition Arousal/Alertness: Awake/alert Behavior During Therapy: WFL for tasks assessed/performed Overall Cognitive Status: No family/caregiver present to determine baseline cognitive functioning Memory: Decreased short-term memory (some difficulty recalling vision exercises from yesterday)  Blood pressure 131/89, pulse 70, temperature 97.8 F (36.6 C), temperature source Oral, resp. rate 21, height 5\' 11"  (1.803 m), weight 210 kg (462 lb 15.5 oz), SpO2 95.00%. Physical Exam  Nursing note and vitals reviewed. Constitutional: He is oriented to person, place, and time. He appears well-developed and well-nourished.  Morbidly obese.  HENT:  Head: Normocephalic and atraumatic.  Eyes: Conjunctivae are normal. Pupils are equal, round, and reactive to light.  Tends to keep right  eye closed  Neck: Normal range of motion. Neck supple.  Cardiovascular: Normal rate and regular rhythm.   No murmur heard. Respiratory: Effort normal. No respiratory distress. He has wheezes.  GI: Soft. Bowel sounds are normal. He exhibits no distension. There is no tenderness.  Obese with VAC in place.   Musculoskeletal: He exhibits no edema.  Swelling about both knees.   Neurological: He is alert and oriented to person, place, and time.  Delayed response  Skin: Skin is warm and dry.  Psychiatric: He has a normal mood and affect. His behavior is normal. Judgment and thought content normal.    Results for orders placed during the hospital encounter of 12/06/13 (from the past 24 hour(s))  HEMOGLOBIN A1C     Status: Abnormal   Collection Time    12/15/13  7:33 AM      Result Value Ref Range   Hemoglobin A1C 5.8 (*) <5.7 %   Mean Plasma Glucose 120 (*) <117 mg/dL  BASIC METABOLIC PANEL     Status: Abnormal   Collection Time  12/15/13  7:34 AM      Result Value Ref Range   Sodium 143  137 - 147 mEq/L   Potassium 3.8  3.7 - 5.3 mEq/L   Chloride 99  96 - 112 mEq/L   CO2 34 (*) 19 - 32 mEq/L   Glucose, Bld 104 (*) 70 - 99 mg/dL   BUN 18  6 - 23 mg/dL   Creatinine, Ser 0.81  0.50 - 1.35 mg/dL   Calcium 8.7  8.4 - 10.5 mg/dL   GFR calc non Af Amer >90  >90 mL/min   GFR calc Af Amer >90  >90 mL/min   Anion gap 10  5 - 15  CBC     Status: Abnormal   Collection Time    12/15/13  8:32 AM      Result Value Ref Range   WBC 6.5  4.0 - 10.5 K/uL   RBC 4.80  4.22 - 5.81 MIL/uL   Hemoglobin 12.9 (*) 13.0 - 17.0 g/dL   HCT 42.8  39.0 - 52.0 %   MCV 89.2  78.0 - 100.0 fL   MCH 26.9  26.0 - 34.0 pg   MCHC 30.1  30.0 - 36.0 g/dL   RDW 18.2 (*) 11.5 - 15.5 %   Platelets 128 (*) 150 - 400 K/uL   No results found.  Assessment/Plan: Diagnosis: deconditioning after multiple medical, pituitary tumor resection 1. Does the need for close, 24 hr/day medical supervision in concert with the  patient's rehab needs make it unreasonable for this patient to be served in a less intensive setting? Yes 2. Co-Morbidities requiring supervision/potential complications: morbid obesity, endstage OA of both knees 3. Due to bladder management, bowel management, safety, skin/wound care, disease management, medication administration, pain management and patient education, does the patient require 24 hr/day rehab nursing? Yes 4. Does the patient require coordinated care of a physician, rehab nurse, PT (1-2 hrs/day, 5 days/week) and OT (1-2 hrs/day, 5 days/week) to address physical and functional deficits in the context of the above medical diagnosis(es)? Yes Addressing deficits in the following areas: balance, endurance, locomotion, strength, transferring, bowel/bladder control, bathing, dressing, feeding, grooming, toileting and psychosocial support 5. Can the patient actively participate in an intensive therapy program of at least 3 hrs of therapy per day at least 5 days per week? Yes 6. The potential for patient to make measurable gains while on inpatient rehab is excellent 7. Anticipated functional outcomes upon discharge from inpatient rehab are modified independent and supervision  with PT, modified independent and min assist with OT, n/a with SLP. 8. Estimated rehab length of stay to reach the above functional goals is: 8-13 days 9. Does the patient have adequate social supports to accommodate these discharge functional goals? Yes 10. Anticipated D/C setting: Home 11. Anticipated post D/C treatments: HH therapy and Outpatient therapy 12. Overall Rehab/Functional Prognosis: excellent  RECOMMENDATIONS: This patient's condition is appropriate for continued rehabilitative care in the following setting: CIR Patient has agreed to participate in recommended program. Yes Note that insurance prior authorization may be required for reimbursement for recommended care.  Comment: Rehab Admissions  Coordinator to follow up.  Thanks,  Meredith Staggers, MD, Mellody Drown     12/15/2013

## 2013-12-15 NOTE — Progress Notes (Signed)
POD 4 AF VSS Awake alert. Vision doing better No significant nasal discharge or bleeding. No evidence of CSF leak Will plan on removing septal splints in 2-3 days. Stable post op course

## 2013-12-15 NOTE — Progress Notes (Signed)
Patient was already wearing the BiPAP when RT went to the room.  Patient is tolerating at this time with no complications. RT will continue to monitor.

## 2013-12-15 NOTE — Progress Notes (Signed)
Patient ID: [name] [name], male   DOB: 1966/03/09, 48 y.o.   MRN: [id number] Doing well, no sob. Pathology results pending

## 2013-12-15 NOTE — Progress Notes (Addendum)
[name] TEAM 1 - Stepdown/ICU TEAM Progress Note  [name] [name] ALP:[id number] DOB: 22-Jul-1965 DOA: 12/06/2013 PCP: [name], MD  Admit HPI / Brief Narrative: 48 y/o M with a PMH of HTN, dCHF, OSA who presented to [facility] ER with 2 day hx of HA, blurry vision, & 1 episode of dark emesis (? If blood). CT evaluation noted sellar & suprasellar mass lesion. Developed hypercarbic respiratory failure in ER requiring bipap. PCCM consulted for ICU admission.   Significant Events: 7/04 - Admit with HA, blurry vision, vomiting (dark emesis, ? blood). CT positive for suprasellar mass  7/5 BIPAP overnight  7/6 significant AMS, improved with BiPAP  7/8 To the OR for sellar mass, remained intubated overnight  7/9 Extubated, no BiPAP due to nasal packing 7/10 nasal packing removed per ENT 7/12 [facility] assumes care  HPI/Subjective: C/o severe constipation.  Denies cp or sob.  Feels his joint pains are much better controlled w/ the resumption of his arthrotec.   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 Per ENT and Neurosurgery - have spoken with Dr Dagmar Hait- recommended to repeat the following- TSH, F T4, ACTH and Cortisol tomorrow 6 AM - watch for DI symptoms and of course hypotension -  appt with Dr Buddy Duty made for 7/ 24 Friday 8 AM  Ptosis of R Eye   Peripheral Neuropathy   Hyperglycemia A1c 5.8 therefore not c/w DM   Acute on Chronic Hypercarbic Respiratory Failure due to OSA / OHS c/b post-op narcotics - appears to be stabilizing - care as per PCCM   Hypertension  BP improved with adjustments - follow w/o change today   Bradycardia  Resolved - resume coreg and follow  Diastolic CHF  Well compensated at present - control BP to avoid exacerbation  Hypokalemia Replaced to normal - Mg is normal - follow   Morbid Obesity - Body mass index is 64.6 kg/(m^2).  Hematemesis  one episode, on NSAID at baseline - no further  bleeding - resumed arthrotec   Mild Thrombocytopenia  Stable/improving   RLQ Chronic Wound VAC / candida dermatitis RLQ  WOC attending to wound care - diflucan as previously prescribed per ID  MRSA screen +  Code Status: FULL Family Communication: no family present at time of exam Disposition Plan: SDU  Consultants: Neurosurgery ENT PCCM  Antibiotics: Diflucan (yeast RLQ wound) 7/4>>  Vancomycin 7/7>>7/8  DVT prophylaxis: SCDs  Objective: Blood pressure 131/89, pulse 70, temperature 98 F (36.7 C), temperature source Oral, resp. rate 21, height 5\' 11"  (1.803 m), weight 210 kg (462 lb 15.5 oz), SpO2 95.00%.  Intake/Output Summary (Last 24 hours) at 12/15/13 1601 Last data filed at 12/15/13 1536  Gross per 24 hour  Intake    770 ml  Output   3000 ml  Net  -2230 ml   Exam: General: No acute respiratory distress Lungs: Clear to auscultation bilaterally without wheezes or crackles but BS quite distant  Cardiovascular: Regular rate and rhythm without murmur gallop or rub - heart sounds quite distant  Abdomen: Morbidly obese, nontender, nondistended, soft, bowel sounds positive, no rebound, no ascites, no appreciable mass - VAC in place R LQ  Extremities: No significant cyanosis, clubbing; trace edema bilateral lower extremities  Data Reviewed: Basic Metabolic Panel:  Recent Labs Lab 12/09/13 0700 12/10/13 0425 12/11/13 0454 12/12/13 0350 12/13/13 0545 12/15/13 0734  NA 140 141 140 143 143 143  K 4.4 3.7 3.4* 3.0* 3.3* 3.8  CL 95* 95* 94*  97 97 99  CO2 39* 39* 34* 35* 36* 34*  GLUCOSE 92 101* 112* 120* 98 104*  BUN 15 16 14 10 16 18   CREATININE 0.82 0.84 1.03 0.79 0.81 0.81  CALCIUM 9.0 8.9 8.5 8.4 8.6 8.7  MG 2.1 2.1 1.8 1.8 2.0  --   PHOS 2.0* 2.0* 2.5 2.3 2.5  --    Liver Function Tests: No results found for this basename: AST, ALT, ALKPHOS, BILITOT, PROT, ALBUMIN,  in the last 168 hours  CBC:  Recent Labs Lab 12/10/13 0425 12/11/13 0454  12/12/13 0350 12/13/13 0545 12/15/13 0832  WBC 5.7 8.6 7.6 6.9 6.5  HGB 12.2* 12.7* 12.2* 12.4* 12.9*  HCT 41.0 41.8 39.8 40.4 42.8  MCV 89.7 89.7 88.1 89.2 89.2  PLT 127* 111* 121* 123* 128*    Recent Results (from the past 240 hour(s))  MRSA PCR SCREENING     Status: Abnormal   Collection Time    12/06/13  5:15 PM      Result Value Ref Range Status   MRSA by PCR POSITIVE (*) NEGATIVE Final   Comment:            The GeneXpert MRSA Assay (FDA     approved for NASAL specimens     only), is one component of a     comprehensive MRSA colonization     surveillance program. It is not     intended to diagnose MRSA     infection nor to guide or     monitor treatment for     MRSA infections.     RESULT CALLED TO, READ BACK BY AND VERIFIED WITH:     SMITH,A RN 2036 12/06/13 MITCHELL,L  CULTURE, BLOOD (ROUTINE X 2)     Status: None   Collection Time    12/09/13 12:26 PM      Result Value Ref Range Status   Specimen Description BLOOD LEFT HAND   Final   Special Requests BOTTLES DRAWN AEROBIC ONLY 5CC   Final   Culture  Setup Time     Final   Value: 12/09/2013 16:48     Performed at [facility]   Culture     Final   Value: NO GROWTH 5 DAYS     Performed at [facility]   Report Status 12/15/2013 FINAL   Final  CULTURE, BLOOD (ROUTINE X 2)     Status: None   Collection Time    12/09/13 12:40 PM      Result Value Ref Range Status   Specimen Description BLOOD BLOOD LEFT FOREARM   Final   Special Requests     Final   Value: BOTTLES DRAWN AEROBIC AND ANAEROBIC 10CC BLUE  5CC RED   Culture  Setup Time     Final   Value: 12/09/2013 16:49     Performed at [facility]   Culture     Final   Value: NO GROWTH 5 DAYS     Performed at [facility]   Report Status 12/15/2013 FINAL   Final     Studies:  Recent x-ray studies have been reviewed in detail by the Attending Physician  Scheduled Meds:  Scheduled Meds: . carvedilol  6.25 mg Oral BID WC  .  citalopram  40 mg Oral Daily  . diclofenac  75 mg Oral BID   And  . misoprostol  200 mcg Oral BID  . feeding supplement (PRO-STAT SUGAR FREE 64)  30 mL Oral  TID WC  . fluconazole  100 mg Oral Daily  . furosemide  40 mg Oral Daily  . multivitamin with minerals  1 tablet Oral Daily  . nutrition supplement (JUVEN)  1 packet Oral BID BM  . pantoprazole  40 mg Oral Daily  . sodium chloride  10-40 mL Intracatheter Q12H    Time spent on care of this patient: 35 mins   MCCLUNG,JEFFREY T , MD   [facility] Office  [phone] Pager - Text Page per Shea Evans as per below:  On-Call/Text Page:      [email or web address]      password [email or web address]  If 7PM-7AM, please contact night-coverage [email or web address] Password [email or web address] 12/15/2013, 4:01 PM   LOS: 9 days

## 2013-12-15 NOTE — Progress Notes (Signed)
PULMONARY / CRITICAL CARE MEDICINE   Name: [name] [name] MRN: [id number] DOB: 12/10/1965    ADMISSION DATE:  12/06/2013  REFERRING MD :  Dr. Doy Mince PRIMARY SERVICE: PCCM   CHIEF COMPLAINT:  Hypercarbic Respiratory Failure   BRIEF PATIENT DESCRIPTION: 48 y/o M with a PMH of HTN, dCHF, OSA who presented to [facility] ER with 2 day hx of HA, blurry vision, & 1 episode of dark emesis (? If blood).  CT evaluation noted sellar & suprasellar mass lesion.  Developed hypercarbic respiratory failure in ER requiring bipap.  PCCM consulted for ICU admission.   SIGNIFICANT EVENTS: 7/04 - Admit with HA, blurry vision, vomiting (dark emesis, ? blood). CT positive for suprasellar mass 7/5 BIPAP overnight 7/6 significant AMS, improving with BiPAP 7/8 To the OR for sellar mass, remained intubated overnight 7/9 Extubated, no BiPAP due to nasal packing  STUDIES:  7/04 - CT head >> prominent, large sellar & suprasellar mass lesion with extension into the R cavernous sinus, ssuperior extension beyond the diaphragm sella. Osseous destruction with extension into sphenoid sinus.  LINES / TUBES: R PICC line 7/6>>> ETT 7/8>>>7/9  CULTURES: Blood 7/7>>>  ANTIBIOTICS: Diflucan (yeast RLQ wound) 7/4>>> Vancomycin 7/7>>>7/9  SUBJECTIVE:   Doing well on 3S  VITAL SIGNS: Temp:  [97.7 F (36.5 C)-98.3 F (36.8 C)] 97.7 F (36.5 C) (07/13 0800) Pulse Rate:  [77-91] 80 (07/13 0534) Resp:  [10-19] 10 (07/13 0534) BP: (142-158)/(88-100) 158/98 mmHg (07/13 0800) SpO2:  [96 %-100 %] 100 % (07/13 0534) Weight:  [210 kg (462 lb 15.5 oz)] 210 kg (462 lb 15.5 oz) (07/13 0758) [place] oxygen      INTAKE / OUTPUT: Intake/Output     07/12 0701 - 07/13 0700 07/13 0701 - 07/14 0700   P.O. 360 240   I.V. (mL/kg) 230 (1.1) 30 (0.1)   Total Intake(mL/kg) 590 (2.8) 270 (1.3)   Urine (mL/kg/hr) 4300 (0.9) 550 (1)   Total Output 4300 550   Net -3710 -280         PHYSICAL EXAMINATION:   Gen: morbidly obese HEENT:  NCAT, right eye ptosis but pupils are reactive PULM: Distant BS diffusely CV: RRR, no mgr, no JVD AB: BS+, soft, nontender, no hsm Ext: warm, 1+ leg edema, no clubbing, no cyanosis Derm: R abdominal wound vac Neuro: Awake, anxious, moving all ext to command, oriented X 4   LABS:  CBC  Recent Labs Lab 12/12/13 0350 12/13/13 0545 12/15/13 0832  WBC 7.6 6.9 6.5  HGB 12.2* 12.4* 12.9*  HCT 39.8 40.4 42.8  PLT 121* 123* 128*   Coag's No results found for this basename: APTT, INR,  in the last 168 hours BMET  Recent Labs Lab 12/12/13 0350 12/13/13 0545 12/15/13 0734  NA 143 143 143  K 3.0* 3.3* 3.8  CL 97 97 99  CO2 35* 36* 34*  BUN 10 16 18   CREATININE 0.79 0.81 0.81  GLUCOSE 120* 98 104*   Electrolytes  Recent Labs Lab 12/11/13 0454 12/12/13 0350 12/13/13 0545 12/15/13 0734  CALCIUM 8.5 8.4 8.6 8.7  MG 1.8 1.8 2.0  --   PHOS 2.5 2.3 2.5  --    Sepsis Markers No results found for this basename: LATICACIDVEN, PROCALCITON, O2SATVEN,  in the last 168 hours ABG  Recent Labs Lab 12/10/13 2250 12/13/13 0500  PHART 7.488* 7.415  PCO2ART 47.6* 57.7*  PO2ART 67.0* 94.0   Liver Enzymes No results found for this basename: AST, ALT, ALKPHOS, BILITOT, ALBUMIN,  in the  last 168 hours Cardiac Enzymes No results found for this basename: TROPONINI, PROBNP,  in the last 168 hours Glucose No results found for this basename: GLUCAP,  in the last 168 hours Imaging  ASSESSMENT / PLAN:  Principal Problem:   Suprasellar mass Active Problems:   OSA (obstructive sleep apnea)   Neoplasm of brain causing mass effect on adjacent structures   Acute respiratory failure with hypercapnia   Morbid obesity   Abdominal wall abscess   Acute on chronic diastolic heart failure   Extreme obesity   H/O infectious disease   Obstructive apnea   Chronic diastolic heart failure   Infected wound   S/p transsphenoidal resection for pituitary tumor.  Ptosis of R Eye  Depression  / Anxiety Peripheral Neuropathy  Plan:    -cont post op care per neuro-surg  - Continue home wellbutrin, gabapentin.  - See endocrine for w/u.  Acute on Chronic Hypercarbic Respiratory Failure > due to OSA / OHS c/b post-op narcotics  Plan:    - nocturnal BIPAP  - will see if we can get him auto-titrate at d/c as has a in-home PSG pending.   - OOB to chair.  -minimize sedating drugs   Hypertension  Bradycardia  dCHF  Plan:   - Continue home meds:  Lasix, coreg.  - Continue norvasc 5 mg PO daily with holding parameters.  Hypokalemia Plan  Replace/recheck   Morbid Obesity  Hematemesis - 1 episode, on NSAID at baseline.  No further bleeding  Plan:    - dat  - PPI.   Mild Thrombocytopenia  Plan:   - DVT:  SQ Heparin.   RLQ Chronic Wound VAC Yeast RLQ  Plan:    -care per [facility] /surgery/ID  Sellar & Suprasellar Mass Hyperglycemia   Plan:    - Needs endocrine f/u post op by a few days.  - Cortisol level 21.1, will repeat in a few days or if hypotensive.  - SSI.  pccm will cont to f/u for bipap OHS/OSA issues.   Mariel Sleet Beeper  [phone]  Cell  [phone]  If no response or cell goes to voicemail, call beeper [phone]  12/15/2013, 9:33 AM

## 2013-12-15 NOTE — Progress Notes (Signed)
tPA removed from PICC line x 3 ports.  GBR from each port, flushed with 10cc NS in each port and a new cap was placed.  Marianna Payment

## 2013-12-16 DIAGNOSIS — I5032 Chronic diastolic (congestive) heart failure: Secondary | ICD-10-CM

## 2013-12-16 DIAGNOSIS — M1710 Unilateral primary osteoarthritis, unspecified knee: Secondary | ICD-10-CM

## 2013-12-16 DIAGNOSIS — R5381 Other malaise: Secondary | ICD-10-CM

## 2013-12-16 LAB — CBC
HCT: 40.4 % (ref 39.0–52.0)
Hemoglobin: 12.2 g/dL — ABNORMAL LOW (ref 13.0–17.0)
MCH: 27.2 pg (ref 26.0–34.0)
MCHC: 30.2 g/dL (ref 30.0–36.0)
MCV: 90 fL (ref 78.0–100.0)
Platelets: 128 10*3/uL — ABNORMAL LOW (ref 150–400)
RBC: 4.49 MIL/uL (ref 4.22–5.81)
RDW: 18.3 % — ABNORMAL HIGH (ref 11.5–15.5)
WBC: 6.7 10*3/uL (ref 4.0–10.5)

## 2013-12-16 LAB — BASIC METABOLIC PANEL
Anion gap: 9 (ref 5–15)
BUN: 23 mg/dL (ref 6–23)
CO2: 36 mEq/L — ABNORMAL HIGH (ref 19–32)
Calcium: 8.6 mg/dL (ref 8.4–10.5)
Chloride: 97 mEq/L (ref 96–112)
Creatinine, Ser: 0.8 mg/dL (ref 0.50–1.35)
GFR calc Af Amer: >90 mL/min (ref >90)
GFR calc non Af Amer: >90 mL/min (ref >90)
Glucose, Bld: 97 mg/dL (ref 70–99)
Potassium: 3.8 mEq/L (ref 3.7–5.3)
Sodium: 142 mEq/L (ref 137–147)

## 2013-12-16 LAB — TSH: TSH: 0.59 u[IU]/mL (ref 0.350–4.500)

## 2013-12-16 LAB — GLUCOSE, CAPILLARY: Glucose-Capillary: 92 mg/dL (ref 70–99)

## 2013-12-16 MED ORDER — BISACODYL 10 MG RE SUPP: 10.0000 mg | Freq: Every day | RECTAL | Status: DC | PRN

## 2013-12-16 NOTE — Clinical Social Work Psychosocial (Signed)
Clinical Social Work Department BRIEF PSYCHOSOCIAL ASSESSMENT 12/16/2013  Patient:  [name] [name],[name] [name]     Account Number:  [id number]     Admit date:  12/06/2013  Clinical Social Worker:  Lovey Newcomer  Date/Time:  12/16/2013 10:48 AM  Referred by:  Physician  Date Referred:  12/16/2013 Referred for  SNF Placement   Other Referral:   Interview type:  Patient Other interview type:   Patient alert and oriented at time of assessment.    PSYCHOSOCIAL DATA Living Status:  FAMILY Admitted from facility:   Level of care:   Primary support name:  Patty Primary support relationship to patient:  PARENT Degree of support available:   Support is good.    CURRENT CONCERNS Current Concerns  Post-Acute Placement   Other Concerns:    SOCIAL WORK ASSESSMENT / PLAN CSW met with patient at bedside to complete assessment. Patient is agreeable to SNF plan as backup to CIR but states that he wants to return home at discharge. Patient reports that his parents are able to assist with his meals and cleaning his bedside urinal. He states that he "doesn't want to walk" because his orthopedic surgeon said that he should be walking right now. Patient states that he has his mom, dad, and brother for support at home. He also reports that the family has made modifications to the home to make it more accessible to him. He states that he has been receiving home care from [facility] and is happy with the care he received. He feels like this would be enough for him to return home and he doesn't believe he needs rehab at this time. Patient states that moved back to [place] from [place] and now lives here permanantly with his parents. CSW explained SNF search process and answered questions. CSW will follow up with bed offers.   Assessment/plan status:  Psychosocial Support/Ongoing Assessment of Needs Other assessment/ plan:   Complete Fl2, Fax, PASRR   Information/referral to community resources:    CSW contact information and SNF list given.    PATIENT'S/FAMILY'S RESPONSE TO PLAN OF CARE: Patient is agreeable to SNF plan as backup to CIR but states that he wants to return home at discharge. CSW will assist.         Liz Beach MSW, [place], [place], [phone]

## 2013-12-16 NOTE — Progress Notes (Signed)
Occupational Therapy Treatment Patient Details Name: [name] [name] MRN: [id number] DOB: 1966-05-25 Today's Date: 12/16/2013    History of present illness HPI: 48 y/o M with a PMH of HTN, dCHF, OSA who presented to [facility] ER with 2 day hx of HA, blurry vision, & 1 episode of dark emesis (? If blood). CT evaluation noted sellar & suprasellar mass lesion. Developed hypercarbic respiratory failure in ER requiring bipap. On 7/8, taken to OR for transphenoidal resection of pituitary tumor. Returned to ICU on vent for ~1day.  Patient now off vent and therapy orders received.   OT comments  Pt making steady progress. Decreased c/o pain with mobility today. Increased strength and endurance. Pt states that he has been taking his arthritis medicine, which he feels helps him tremendously. R eye motility has improved, however pt continues to have horizontal diplopia. Occlusion on lens  moved more medially to accomodate for visual change. Continue to recommend short stay at CIR to return pt to PLOF to facilitate safe D/C home with supportive family. Pt very motivated.   Follow Up Recommendations  CIR;Supervision/Assistance - 24 hour    Equipment Recommendations  None recommended by OT    Recommendations for Other Services Rehab consult    Precautions / Restrictions Precautions Precautions: Fall Precaution Comments: wound vac       Mobility Bed Mobility Overal bed mobility: Needs Assistance Bed Mobility: Rolling;Sidelying to Sit Rolling: Supervision Sidelying to sit: Supervision          Transfers Overall transfer level: Needs assistance Equipment used: Rolling walker (2 wheeled) Transfers: Sit to/from [facility] Sit to Stand: Min assist;+2 physical assistance;+2 safety/equipment Stand pivot transfers: Min assist;+2 physical assistance;+2 safety/equipment       General transfer comment: Improved ability to power up this session    Balance Overall balance assessment:  Needs assistance Sitting-balance support: Feet supported Sitting balance-Leahy Scale: Good     Standing balance support: During functional activity Standing balance-Leahy Scale: Fair                     ADL Overall ADL's : Needs assistance/impaired                     Lower Body Dressing: Maximal assistance;Sit to/from stand   Toilet Transfer: +2 for safety/equipment;Minimal assistance;Stand-pivot Armed forces technical officer Details (indicate cue type and reason): used bariatric BSC Toileting- Clothing Manipulation and Hygiene: Sitting/lateral lean;Minimal assistance Toileting - Clothing Manipulation Details (indicate cue type and reason): foley     Functional mobility during ADLs: Minimal assistance;+2 for safety/equipment;Rolling walker;+2 for physical assistance General ADL Comments: Pt with improved mobility today      Vision Eye Alignment: Impaired (comment)   Ocular Range of Motion: Restricted on the left;Restricted looking up;Restricted looking down Tracking/Visual Pursuits: Right eye does not track medially;Decreased smoothness of eye movement to LEFT inferior field;Decreased smoothness of eye movement to LEFT superior field Saccades: Decreased speed of saccadic movement Convergence: Impaired (comment) Diplopia Assessment: Disappears with one eye closed;Objects split side to side   Additional Comments: Pt has been using the spot occlusion technique and completing the gaze stabilization exercises. On assessment, pt with increased medial eye movment R eye   Perception     Praxis      Cognition   Behavior During Therapy: WFL for tasks assessed/performed Overall Cognitive Status: No family/caregiver present to determine baseline cognitive functioning  Extremity/Trunk Assessment               Exercises Other Exercises Other Exercises:  (Pt has been completing UB theraband ex)   Shoulder Instructions       General  Comments      Pertinent Vitals/ Pain       VSS. No c/o pain  Home Living                                          Prior Functioning/Environment              Frequency Min 3X/week     Progress Toward Goals  OT Goals(current goals can now be found in the care plan section)  Progress towards OT goals: Progressing toward goals  Acute Rehab OT Goals Patient Stated Goal: to be able to walk again OT Goal Formulation: With patient Time For Goal Achievement: 12/26/13 Potential to Achieve Goals: Good ADL Goals Pt Will Perform Lower Body Dressing: with min assist;with adaptive equipment;sit to/from stand Pt Will Transfer to Toilet: with min assist;bedside commode;stand pivot transfer Pt Will Perform Toileting - Clothing Manipulation and hygiene: with min assist;with adaptive equipment;sit to/from stand;sitting/lateral leans Pt/caregiver will Perform Home Exercise Program: Both right and left upper extremity;With theraband Additional ADL Goal #1: Pt will verbalize understanding of use of "spot occlusion" to reduce symptoms of diplopia to increase independence with ADL/mobility  Plan Discharge plan remains appropriate    Co-evaluation                 End of Session Equipment Utilized During Treatment: Gait belt;Rolling walker   Activity Tolerance Patient tolerated treatment well   Patient Left Other (comment);with call bell/phone within reach (on [facility])   Nurse Communication Mobility status        Time: 5643-3295 OT Time Calculation (min): 38 min  Charges: OT General Charges $OT Visit: 1 Procedure OT Treatments $Self Care/Home Management : 23-37 mins $Therapeutic Activity: 8-22 mins  Jerimyah Vandunk,HILLARY 12/16/2013, 1:45 PM   [facility], OTR/L  [phone] 12/16/2013

## 2013-12-16 NOTE — Progress Notes (Signed)
[name] [name] TEAM 1 - Stepdown/ICU TEAM Progress Note  Satvik Penning JKD:[id number] DOB: 17-Dec-1965 DOA: 12/06/2013 PCP: [name], MD  Admit HPI / Brief Narrative: 48 y/o M with a PMH of HTN, dCHF, OSA who recently moved here from [place] and presented to [facility] ER with 2 day hx of HA, blurry vision, & 1 episode of dark emesis (? If blood). CT evaluation noted sellar & suprasellar mass lesion. Developed hypercarbic respiratory failure in ER requiring bipap. PCCM consulted for ICU admission.   Significant Events: 7/04 - Admit with HA, blurry vision, vomiting (dark emesis, ? blood). CT positive for suprasellar mass  7/5 BIPAP overnight  7/6 significant AMS, improved with BiPAP  7/8 To the OR for sellar mass, remained intubated overnight  7/9 Extubated, no BiPAP due to nasal packing 7/10 nasal packing removed per ENT 7/12 [facility] assumes care  HPI/Subjective: No BM yet. Willing to try dulcolax suppository if no BM by this afternoon. Very hesitant to have O2 weaned.   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 Per ENT and Neurosurgery - will need endocrine w/u-   Ptosis of R Eye   Peripheral Neuropathy   Hyperglycemia A1c 5.8 therefore not c/w DM   Acute on Chronic Hypercarbic Respiratory Failure due to OSA / OHS c/b post-op narcotics - appears to be stabilizing - in home PSG pending- will need to try to auto-titrate at home for now per pulm notes -  - care as per PCCM   Hypertension  BP improved with adjustments  - follow w/o change today   Bradycardia  Resolved - resume coreg and follow  Diastolic CHF  Well compensated at present - negative balance by about 27 L-  control BP to avoid exacerbation  Hypokalemia Replaced to normal - Mg is normal - follow   Morbid Obesity - Body mass index is 64.6 kg/(m^2). - has lost 60 lb since Jan of this yr.   Hematemesis  one episode, on NSAID at baseline - no further  bleeding - resumed arthrotec- d/c Protonix  Mild Thrombocytopenia  Stable/improving   RLQ Chronic Wound VAC / Yeast RLQ  WOC attending to wound care - diflucan as previously prescribed per ID- started 7/4- follow clinically for discontinuation  MRSA screen +  Code Status: FULL Family Communication: no family present at time of exam Disposition Plan: SDU- CIR? 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 rate 18, height 5\' 11"  (1.803 m), weight 210 kg (462 lb 15.5 oz), SpO2 95.00%.  Intake/Output Summary (Last 24 hours) at 12/16/13 1055 Last data filed at 12/16/13 0840  Gross per 24 hour  Intake    480 ml  Output   2450 ml  Net  -1970 ml   Exam: General: No acute respiratory distress- morbidly obese Lungs: Clear to auscultation bilaterally without wheezes or crackles but BS quite distant  Cardiovascular: Regular rate and rhythm without murmur gallop or rub - heart sounds quite distant  Abdomen: Morbidly obese, nontender, nondistended, soft, bowel sounds positive, no rebound, no ascites, no appreciable mass - VAC in place R LQ  Extremities: No significant cyanosis, clubbing; trace edema bilateral lower extremities  Data Reviewed: Basic Metabolic Panel:  Recent Labs Lab 12/10/13 0425 12/11/13 0454 12/12/13 0350 12/13/13 0545 12/15/13 0734 12/16/13 0530  NA 141 140 143 143 143 142  K 3.7 3.4* 3.0* 3.3* 3.8 3.8  CL 95* 94* 97 97 99 97  CO2 39* 34* 35* 36* 34* 36*  GLUCOSE 101* 112* 120* 98 104* 97  BUN 16 14 10 16 18 23   CREATININE 0.84 1.03 0.79 0.81 0.81 0.80  CALCIUM 8.9 8.5 8.4 8.6 8.7 8.6  MG 2.1 1.8 1.8 2.0  --   --   PHOS 2.0* 2.5 2.3 2.5  --   --     Liver Function Tests: No results found for this basename: AST, ALT, ALKPHOS, BILITOT, PROT, ALBUMIN,  in the last 168 hours  CBC:  Recent Labs Lab 12/11/13 0454 12/12/13 0350 12/13/13 0545 12/15/13 0832 12/16/13 0530  WBC 8.6 7.6 6.9 6.5 6.7  HGB 12.7* 12.2* 12.4* 12.9* 12.2*  HCT 41.8 39.8 40.4 42.8 40.4  MCV 89.7 88.1 89.2 89.2 90.0  PLT 111* 121* 123* 128* 128*    Recent Results (from the past 240 hour(s))  MRSA PCR SCREENING     Status: Abnormal   Collection Time    12/06/13  5:15 PM      Result Value Ref Range Status   MRSA by PCR POSITIVE (*) NEGATIVE Final   Comment:            The GeneXpert MRSA Assay (FDA     approved for NASAL specimens     only), is one component of a     comprehensive MRSA colonization     surveillance program. It is not     intended to diagnose MRSA     infection nor to guide or     monitor treatment for     MRSA infections.     RESULT CALLED TO, READ BACK BY AND VERIFIED WITH:     SMITH,A RN [id number] 12/06/13 MITCHELL,L  CULTURE, BLOOD (ROUTINE X 2)     Status: None   Collection Time    12/09/13 12:26 PM      Result Value Ref Range Status   Specimen Description BLOOD LEFT HAND   Final   Special Requests BOTTLES DRAWN AEROBIC ONLY 5CC   Final   Culture  Setup Time     Final   Value: 12/09/2013 16:48     Performed at [facility]   Culture     Final   Value: NO GROWTH 5 DAYS     Performed at [facility]   Report Status 12/15/2013 FINAL   Final  CULTURE, BLOOD (ROUTINE X 2)     Status: None   Collection Time    12/09/13 12:40 PM      Result Value Ref Range Status   Specimen Description BLOOD BLOOD LEFT FOREARM   Final   Special Requests     Final   Value: BOTTLES DRAWN AEROBIC AND ANAEROBIC 10CC BLUE  5CC RED   Culture  Setup Time     Final   Value: 12/09/2013 16:49     Performed at [facility]   Culture     Final   Value: NO GROWTH 5 DAYS     Performed at [facility]   Report Status 12/15/2013  FINAL   Final     Studies:  Recent x-ray studies have been reviewed in detail by the Attending Physician  Scheduled Meds:  Scheduled Meds: . carvedilol  6.25 mg Oral BID  WC  . citalopram  40 mg Oral Daily  . diclofenac  75 mg Oral BID   And  . misoprostol  200 mcg Oral BID  . feeding supplement (PRO-STAT SUGAR FREE 64)  30 mL Oral TID WC  . fluconazole  100 mg Oral Daily  . furosemide  40 mg Oral Daily  . multivitamin with minerals  1 tablet Oral Daily  . nutrition supplement (JUVEN)  1 packet Oral BID BM  . pantoprazole  40 mg Oral Daily  . polyethylene glycol  17 g Oral Daily  . senna-docusate  1 tablet Oral BID  . sodium chloride  10-40 mL Intracatheter Q12H    Time spent on care of this patient: 47 mins   Debbe Odea , MD   [facility] Office  [phone] Pager - Text Page per Shea Evans as per below:  On-Call/Text Page:      [email or web address]      password [email or web address]  If 7PM-7AM, please contact night-coverage [email or web address] Password [email or web address] 12/16/2013, 10:55 AM   LOS: 10 days

## 2013-12-16 NOTE — Progress Notes (Signed)
Pt. Isn't ready for BIPAP at this time. BIPAP is set up at the bedside on 16/8 & 3L O2 will be bled in when pt. Is ready to go on BIPAP. Pt. Is aware to let RT or RN when he is ready to go on BIPAP.

## 2013-12-16 NOTE — Clinical Social Work Placement (Signed)
Clinical Social Work Department CLINICAL SOCIAL WORK PLACEMENT NOTE 12/16/2013  Patient:  [name] [name],[name] [name]  Account Number:  [id number] Admit date:  12/06/2013  Clinical Social Worker:  Kemper Durie, [place]  Date/time:  12/16/2013 10:54 AM  Clinical Social Work is seeking post-discharge placement for this patient at the following level of care:   [facility]   (*CSW will update this form in Epic as items are completed)   12/16/2013  Patient/family provided with [place] Department of Clinical Social Work's list of facilities offering this level of care within the geographic area requested by the patient (or if unable, by the patient's family).  12/16/2013  Patient/family informed of their freedom to choose among providers that offer the needed level of care, that participate in Medicare, Medicaid or managed care program needed by the patient, have an available bed and are willing to accept the patient.  12/16/2013  Patient/family informed of [facility]' ownership interest in [facility], as well as of the fact that they are under no obligation to receive care at this facility.  PASARR submitted to EDS on 12/16/2013 PASARR number received on   FL2 transmitted to all facilities in geographic area requested by pt/family on  12/16/2013 FL2 transmitted to all facilities within larger geographic area on   Patient informed that his/her managed care company has contracts with or will negotiate with  certain facilities, including the following:     Patient/family informed of bed offers received:   Patient chooses bed at  Physician recommends and patient chooses bed at    Patient to be transferred to  on   Patient to be transferred to facility by  Patient and family notified of transfer on  Name of family member notified:    The following physician request were entered in Epic:   Additional Comments:    Liz Beach MSW, [place], [place], [phone]

## 2013-12-16 NOTE — Progress Notes (Signed)
NUTRITION FOLLOW UP  Intervention:    Continue Juven BID (contains conditionally essential amino acids, arginine and glutamine)   Continue 30 ml Prostat TID, provides 100 kcal and 15 grams of protein per 30 ml  Add high protein snack BID at 2 PM and 8 PM (yogurt per patient request).  Nutrition Dx:   Increased nutrient needs related to extensive wound as evidenced by estimated needs. Ongoing.  Goal:   Intake to meet >90% of estimated nutrition needs. Met.  Monitor:   PO intake, labs, weight trend.  Assessment:   88 male with a history of OSA, dCHF, HTN, laparoscopic sigmoid colectomy, incisional hernia repair and the removal of hernia mesh for infection in 2008 and chronic RLQ abdominal wound. He underwent a I&D 06/06/13 and 06/09/13 by Dr. Arta Bruce in [place]. He has been followed by wound center at [facility].  He presented to [facility] with headaches and blurred vision. He was found to have a suprasellar mass and was admitted to the ICU. 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  PTA, patient was losing weight appropriately through the medi fast program.  S/P transpheniodal resection of pituitary tumor on 7/8.  Patient reports that he has been eating well. Taking the Prostat and Juven as ordered, tolerating well. Requests yogurt as a snack BID.  Height: Ht Readings from Last 1 Encounters:  12/06/13 5' 11"  (1.803 m)    Weight Status:   Wt Readings from Last 1 Encounters:  12/16/13 462 lb 15.5 oz (210 kg)   12/09/13  466 lb (211.376 kg)   Per pt 418 lb (190 kg) on admission   Re-estimated needs:  Kcal: 1900-2100  Protein: 140-160  Fluid: >1.9 L/day  Skin: open abdominal wound with VAC  Diet Order: Cardiac   Intake/Output Summary (Last 24 hours) at 12/16/13 1339 Last data filed at 12/16/13 0840  Gross per 24 hour  Intake    480 ml  Output   2450 ml  Net  -1970 ml    Last BM: None documented    Labs:   Recent Labs Lab 12/11/13 0454  12/12/13 0350 12/13/13 0545 12/15/13 0734 12/16/13 0530  NA 140 143 143 143 142  K 3.4* 3.0* 3.3* 3.8 3.8  CL 94* 97 97 99 97  CO2 34* 35* 36* 34* 36*  BUN 14 10 16 18 23   CREATININE 1.03 0.79 0.81 0.81 0.80  CALCIUM 8.5 8.4 8.6 8.7 8.6  MG 1.8 1.8 2.0  --   --   PHOS 2.5 2.3 2.5  --   --   GLUCOSE 112* 120* 98 104* 97    CBG (last 3)   Recent Labs  12/16/13 1125  GLUCAP 92    Scheduled Meds: . carvedilol  6.25 mg Oral BID WC  . citalopram  40 mg Oral Daily  . diclofenac  75 mg Oral BID   And  . misoprostol  200 mcg Oral BID  . feeding supplement (PRO-STAT SUGAR FREE 64)  30 mL Oral TID WC  . fluconazole  100 mg Oral Daily  . furosemide  40 mg Oral Daily  . multivitamin with minerals  1 tablet Oral Daily  . nutrition supplement (JUVEN)  1 packet Oral BID BM  . polyethylene glycol  17 g Oral Daily  . senna-docusate  1 tablet Oral BID  . sodium chloride  10-40 mL Intracatheter Q12H    Continuous Infusions: None   Molli Barrows, RD, LDN, [place] Pager [phone] After Hours Pager [phone]

## 2013-12-16 NOTE — Progress Notes (Signed)
Pt.'s BIPAP is set up at the bedside on 168 via F[facility] with 4L O2 bled in. Pt. States that he isn't ready for BIPAP at this time but will call when he is ready to go on.

## 2013-12-16 NOTE — Progress Notes (Signed)
Pt on toilet, therapy waiting to come in the room. I will return later this afternoon.  [name] Staggers, MD, [place]

## 2013-12-16 NOTE — Progress Notes (Signed)
Physical Therapy Treatment Patient Details Name: [name] [name] MRN: [id number] DOB: [birth year] Today's Date: 12/16/2013    History of Present Illness HPI: 48 y/o M with a PMH of HTN, dCHF, OSA who presented to [facility] ER with 2 day hx of HA, blurry vision, & 1 episode of dark emesis (? If blood). CT evaluation noted sellar & suprasellar mass lesion. Developed hypercarbic respiratory failure in ER requiring bipap. On 7/8, taken to OR for transphenoidal resection of pituitary tumor. Returned to ICU on vent for ~1day.  Patient now off vent and therapy orders received.    PT Comments    Patient making good progress with activity tolerance and mobility. Patient tolerated increased weight bearing today as well as therex.  Patient was able to tolerate some ambulation. Will progress as tolerated.   Follow Up Recommendations  CIR     Equipment Recommendations  Other (comment) (tbd)    Recommendations for Other Services Rehab consult     Precautions / Restrictions Precautions Precautions: Fall Precaution Comments: wound vac Restrictions Weight Bearing Restrictions: No    Mobility  Bed Mobility               General bed mobility comments: recieved from OT  Transfers Overall transfer level: Needs assistance Equipment used: Rolling walker (2 wheeled)   Sit to Stand: Min assist;+2 physical assistance;+2 safety/equipment Stand pivot transfers: Min assist;+2 physical assistance;+2 safety/equipment       General transfer comment: Improved ability to power up this session  Ambulation/Gait Ambulation/Gait assistance: Min assist Ambulation Distance (Feet): 6 Feet Assistive device: Rolling walker (2 wheeled) Gait Pattern/deviations: Step-to pattern;Shuffle;Wide base of support         Stairs            Wheelchair Mobility    Modified Rankin (Stroke Patients Only)       Balance     Sitting balance-Leahy Scale: Good       Standing balance-Leahy Scale: Fair                       Cognition Arousal/Alertness: Awake/alert Behavior During Therapy: WFL for tasks assessed/performed Overall Cognitive Status: No family/caregiver present to determine baseline cognitive functioning                      Exercises Other Exercises Other Exercises: long arc quads bilateral x 20 AROM, Other Exercises: Hip flexion x5 bilateral Other Exercises: pre-gait weight shifts-egrees activities    General Comments General comments (skin integrity, edema, etc.): wound vac      Pertinent Vitals/Pain Bilateral LE knee pain at this time    Home Living                      Prior Function            PT Goals (current goals can now be found in the care plan section) Acute Rehab PT Goals Patient Stated Goal: to be able to walk again PT Goal Formulation: With patient Time For Goal Achievement: 12/26/13 Potential to Achieve Goals: Fair    Frequency  Min 3X/week    PT Plan      Co-evaluation             End of Session Equipment Utilized During Treatment: Gait belt Activity Tolerance: Patient tolerated treatment well;Patient limited by fatigue;Patient limited by pain Patient left: in chair;with call bell/phone within reach (bari chair)     Time: 9470-9628 PT Time  Calculation (min): 18 min  Charges:  $Therapeutic Activity: 8-22 mins                    G CodesDuncan Dull Jan 12, 2014, [place], [place] DPT  [phone]

## 2013-12-16 NOTE — Progress Notes (Signed)
Patient ID: [name] [name], male   DOB: [birth year], 48 y.o.   MRN: [id number] Neuro stable. No c/o  Right eye better. Pathology to send specimen for a second opinion

## 2013-12-17 LAB — CORTISOL: Cortisol, Plasma: 12.4 ug/dL

## 2013-12-17 LAB — T4, FREE: Free T4: 0.64 ng/dL — ABNORMAL LOW (ref 0.80–1.80)

## 2013-12-17 NOTE — Progress Notes (Signed)
Visited patient about BiPAP. BiPAP is ready by his bed. He says him and the nurse place it on when he is ready. I instructed him to call if he needed anything.

## 2013-12-17 NOTE — Progress Notes (Signed)
Physical Therapy Treatment Patient Details Name: [name] [name] MRN: [id number] DOB: May 06, 1966 Today's Date: 12/17/2013    History of Present Illness HPI: 48 y/o M with a PMH of HTN, dCHF, OSA who presented to [facility] ER with 2 day hx of HA, blurry vision, & 1 episode of dark emesis (? If blood). CT evaluation noted sellar & suprasellar mass lesion. Developed hypercarbic respiratory failure in ER requiring bipap. On 7/8, taken to OR for transphenoidal resection of pituitary tumor. Returned to ICU on vent for ~1day.  Patient now off vent and therapy orders received.    PT Comments    Patient tolerating bed mobility and basic there-ex well, assisted to commode this session, asked to do a return session later after hygiene performed.   Follow Up Recommendations  CIR     Equipment Recommendations  Other (comment) (tbd)    Recommendations for Other Services Rehab consult     Precautions / Restrictions Precautions Precautions: Fall Precaution Comments: wound vac Restrictions Weight Bearing Restrictions: No    Mobility  Bed Mobility Overal bed mobility: Needs Assistance Bed Mobility: Rolling;Sidelying to Sit Rolling: Supervision Sidelying to sit: Supervision       General bed mobility comments: Use of bed to assist with mobility (elevated HOB)  Transfers Overall transfer level: Needs assistance Equipment used: Rolling walker (2 wheeled) Transfers: Sit to/from [facility] Sit to Stand: Min assist;+2 physical assistance;+2 safety/equipment Stand pivot transfers: Min assist;+2 physical assistance;+2 safety/equipment       General transfer comment: Improved ability to power up this session, VCs for positioning and leverage with RW  Ambulation/Gait Ambulation/Gait assistance: Min assist Ambulation Distance (Feet): 6 Feet Assistive device: Rolling walker (2 wheeled)       General Gait Details: pivotal steps to commode   Stairs             Wheelchair Mobility    Modified Rankin (Stroke Patients Only)       Balance     Sitting balance-Leahy Scale: Good       Standing balance-Leahy Scale: Fair                      Cognition Arousal/Alertness: Awake/alert Behavior During Therapy: WFL for tasks assessed/performed Overall Cognitive Status: Within Functional Limits for tasks assessed                      Exercises Other Exercises Other Exercises: supine ROM ankle pumps bilateral LEs Other Exercises: Abd/ADD supine    General Comments        Pertinent Vitals/Pain NAD, VSS    Home Living                      Prior Function            PT Goals (current goals can now be found in the care plan section) Acute Rehab PT Goals Patient Stated Goal: to be able to walk again PT Goal Formulation: With patient Time For Goal Achievement: 12/26/13 Potential to Achieve Goals: Fair Progress towards PT goals: Progressing toward goals    Frequency  Min 3X/week    PT Plan Current plan remains appropriate    Co-evaluation             End of Session Equipment Utilized During Treatment: Gait belt Activity Tolerance: Patient tolerated treatment well;Patient limited by fatigue;Patient limited by pain Patient left: in chair;with call bell/phone within reach (Bedside commode, asked to return later  by pt)     Time: 4081-4481 PT Time Calculation (min): 25 min  Charges:  $Therapeutic Activity: 23-37 mins                    G CodesDuncan Dull Jan 15, 2014, 6:56 PM Alben Deeds, [place] DPT  [phone]

## 2013-12-17 NOTE — Progress Notes (Signed)
Inpatient Rehabilitation  I had a phone conversation with Mrs. Patty Langi (pt's mom),  then stopped by pt's room.  He is currently on 3n1 and not available to meet with me.  His mom indicates that he does want to pursue IP Rehab.  I will initiate the authorization process.  Please call my co-worker Danne Baxter if questions arise in my absence Thursday-Monday.  Thank you!  [place] Admissions Coordinator Cell [phone] Office [phone]

## 2013-12-17 NOTE — Progress Notes (Signed)
Inpatient Rehabilitation  I met with [name] [name] at the bedside to discuss his post acute rehab options.  I answered his initial questions and provided him with informational booklets about IP Rehab.  He says he is leaning toward IP Rehab but wants to discuss with his mom.  He is to contact me with his decision as soon as he has reached his mom.  I will submit for insurance auth this am if he decides to pursue rehab. Please call if questions.  [place] Admissions Coordinator Cell [phone] Office [phone]

## 2013-12-17 NOTE — Progress Notes (Signed)
Physical Therapy Treatment Patient Details Name: [name] [name] MRN: [id number] DOB: 09-07-1965 Today's Date: 12/17/2013    History of Present Illness HPI: 48 y/o M with a PMH of HTN, dCHF, OSA who presented to [facility] ER with 2 day hx of HA, blurry vision, & 1 episode of dark emesis (? If blood). CT evaluation noted sellar & suprasellar mass lesion. Developed hypercarbic respiratory failure in ER requiring bipap. On 7/8, taken to OR for transphenoidal resection of pituitary tumor. Returned to ICU on vent for ~1day.  Patient now off vent and therapy orders received.    PT Comments    Seen for return session, patient tolerated some increased ambulation with RW as well as there ex sitting in chair. Spoke with patient and mother at length regarding increased mobility, OOB expectations as well as increased overall activity. Will continue to see and progress as tolerated. Continue to Rec CIR for acute discharge.  Follow Up Recommendations  CIR     Equipment Recommendations  Other (comment) (tbd)    Recommendations for Other Services Rehab consult     Precautions / Restrictions Precautions Precautions: Fall Precaution Comments: wound vac Restrictions Weight Bearing Restrictions: No    Mobility  Bed Mobility Overal bed mobility: Needs Assistance Bed Mobility: Rolling;Sidelying to Sit Rolling: Supervision Sidelying to sit: Supervision       General bed mobility comments: Use of bed to assist with mobility (elevated HOB)  Transfers Overall transfer level: Needs assistance Equipment used: Rolling walker (2 wheeled) Transfers: Sit to/from Stand Sit to Stand: Min assist Stand pivot transfers: Min assist;+2 physical assistance;+2 safety/equipment       General transfer comment: was able to perform with +1 assist from bedside commode with VCs for positioning  Ambulation/Gait Ambulation/Gait assistance: Min assist Ambulation Distance (Feet): 12 Feet Assistive device: Rolling  walker (2 wheeled)       General Gait Details: able to take steps across room and then perform steps to turn 180 to chair   Stairs            Wheelchair Mobility    Modified Rankin (Stroke Patients Only)       Balance     Sitting balance-Leahy Scale: Good       Standing balance-Leahy Scale: Fair                      Cognition Arousal/Alertness: Awake/alert Behavior During Therapy: WFL for tasks assessed/performed Overall Cognitive Status: Within Functional Limits for tasks assessed                      Exercises Other Exercises Other Exercises: long arc quads bilateral x 20 AROM, Other Exercises: Hip flexion x10 bilateral Other Exercises: Quad sets with abduction adduction while in chair    General Comments        Pertinent Vitals/Pain VSS    Home Living                      Prior Function            PT Goals (current goals can now be found in the care plan section) Acute Rehab PT Goals Patient Stated Goal: to be able to walk again PT Goal Formulation: With patient Time For Goal Achievement: 12/26/13 Potential to Achieve Goals: Fair Progress towards PT goals: Progressing toward goals    Frequency  Min 3X/week    PT Plan Current plan remains appropriate    Co-evaluation  End of Session Equipment Utilized During Treatment: Gait belt Activity Tolerance: Patient tolerated treatment well;Patient limited by fatigue;Patient limited by pain Patient left: in chair;with call bell/phone within reach     Time: 1454-1517 PT Time Calculation (min): 23 min  Charges:  $Therapeutic Exercise: 8-22 mins $Therapeutic Activity: 8-22 mins                    G CodesDuncan Dull 01/04/14, 7:00 PM Alben Deeds, [place] DPT  [phone]

## 2013-12-17 NOTE — Consult Note (Signed)
WOC wound follow up NPWT device turned off; Injected foam with 10cc lidocaine prior to dressing removal  Wound type: non healing surgical wound  Measurement: 2.0cm x 11cm x 7cm with tract at 2 o'clock that is 10cm  Wound bed: beefy red, granulation but deeper in the wound bed it is nongranular, pink and moist Drainage (amount, consistency, odor) moderate in canister, some odor with dressing removal but not noted after cleansing the wound bed Periwound: intact but with some redness noted around the periwound today  Dressing procedure/placement/frequency: Cleansed periwound skin thoroughly; treated skin with antifungal powder and crusted with nosting skin prep.  Window framed the periwound with 4 strips of drape.  Cut large strip of black foam in spiral form and packed the most narrow end into the tunneled area using the remainder of the strip to fill the wound bed.  Drape applied to protect periwound skin and bridge placed in a new location to protect the skin. Seal at 125 mmHG. Pt tolerated well, received PO pain med 30 minutes prior to dressing change. Offered patient support and will follow along with you for [facility] dressing changes  [place], [place]

## 2013-12-17 NOTE — Progress Notes (Signed)
CARE MANAGEMENT NOTE 12/17/2013  Patient:  [name] [name],[name] [name]   Account Number:  [id number]  Date Initiated:  12/10/2013  Documentation initiated by:  Sandi Mariscal  Subjective/Objective Assessment:   history of chronic abdominal wound with MSSA, MRSA, Group B strep, proteus in the past, now needing biopsy of pituitary tumor     Action/Plan:   await postop status to see if pt able to wean from vent, whether he needs a trach, etc.   Anticipated DC Date:  12/17/2013   Anticipated DC Plan:  SKILLED NURSING FACILITY         Choice offered to / List presented to:             Status of service:  In process, will continue to follow Medicare Important Message given?   (If response is "NO", the following Medicare IM given date fields will be blank) Date Medicare IM given:   Medicare IM given by:   Date Additional Medicare IM given:   Additional Medicare IM given by:    Discharge Disposition:    Per UR Regulation:  Reviewed for med. necessity/level of care/duration of stay  If discussed at [place] of Stay Meetings, dates discussed:   12/11/2013  12/16/2013  12/18/2013    Comments:  12-17-13 [address], RN,BSN [phone] CM to continue to monitor for disposition needs. CIR has submitted for insurance authorization.

## 2013-12-17 NOTE — Clinical Social Work Placement (Signed)
Clinical Social Work Department CLINICAL SOCIAL WORK PLACEMENT NOTE 12/17/2013  Patient:  Hole,Pradeep  Account Number:  [id number] Admit date:  12/06/2013  Clinical Social Worker:  Kemper Durie, [place]  Date/time:  12/16/2013 10:54 AM  Clinical Social Work is seeking post-discharge placement for this patient at the following level of care:   [place]   (*CSW will update this form in Epic as items are completed)   12/16/2013  Patient/family provided with [place] Department of Clinical Social Work's list of facilities offering this level of care within the geographic area requested by the patient (or if unable, by the patient's family).  12/16/2013  Patient/family informed of their freedom to choose among providers that offer the needed level of care, that participate in Medicare, Medicaid or managed care program needed by the patient, have an available bed and are willing to accept the patient.  12/16/2013  Patient/family informed of [facility]' ownership interest in [facility], as well as of the fact that they are under no obligation to receive care at this facility.  PASARR submitted to EDS on 12/16/2013 PASARR number received on   FL2 transmitted to all facilities in geographic area requested by pt/family on  12/16/2013 FL2 transmitted to all facilities within larger geographic area on   Patient informed that his/her managed care company has contracts with or will negotiate with  certain facilities, including the following:     Patient/family informed of bed offers received:  12/17/2013 Patient chooses bed at  Physician recommends and patient chooses bed at    Patient to be transferred to  on   Patient to be transferred to facility by  Patient and family notified of transfer on  Name of family member notified:    The following physician request were entered in Epic:   Additional Comments: Patient and patient's mom state they do not  want SNF. Patient will go to CIR or home with HHPT.    Liz Beach MSW, [place], [place], [phone]

## 2013-12-17 NOTE — Progress Notes (Signed)
[place] TEAM 1 - Stepdown/ICU TEAM Progress Note  [name] [name] WVP:[id number] DOB: Oct 21, 1965 DOA: 12/06/2013 PCP: [name], MD  Admit HPI / Brief Narrative: 48 y/o M with a PMH of HTN, dCHF, OSA who recently moved here from [place] and presented to [facility] ER with 2 day hx of HA, blurry vision, & 1 episode of dark emesis. CT evaluation noted sellar & suprasellar mass lesion. Developed hypercarbic respiratory failure in ER requiring bipap. PCCM consulted for ICU admission.   Significant Events: 7/04 - Admit with HA, blurry vision, vomiting (dark emesis, ? blood). CT positive for suprasellar mass  7/5 BIPAP overnight  7/6 significant AMS, improved with BiPAP  7/8 To the OR for sellar mass, remained intubated overnight  7/9 Extubated, no BiPAP due to nasal packing 7/10 nasal packing removed per ENT 7/12 [facility] assumes care  HPI/Subjective: No new complaints today.  Up in chair at bedside.    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 Wynelle Cleveland spoke with Dr Dagmar Hait who recommended repeating TSH, FT4, ACTH and Cortisol and suggested we watch for DI symptoms and hypotension - appt with Dr Buddy Duty made for 7/ 24 8 AM - cortisol not severely depressed - BP is stable   Ptosis of R Eye  Slowly improving   Peripheral Neuropathy  Well controlled at present  Hyperglycemia A1c 5.8 therefore not c/w DM - will stop CBG checks   Acute on Chronic Hypercarbic Respiratory Failure due to OSA / OHS c/b post-op narcotics - appears to be stabilizing - in home PSG pending - will need to try to auto-titrate at home for now per Pulm notes - care as per PCCM - have to keep in SDU as pt is a new start on BIPAP   Hypertension  BP improved with adjustments, but not yet ideal - monitor w/o change today   Bradycardia  Resolved  Diastolic CHF  Well compensated at present - negative balance by about 30 L-  control BP  to avoid exacerbation  Hypokalemia Replaced to normal - Mg is normal   Morbid Obesity - Body mass index is 61.22 kg/(m^2). 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 rate 15, height 5\' 11"  (1.803 m), weight 199 kg (438 lb 11.5 oz), SpO2 95.00%.  Intake/Output Summary (Last 24 hours) at 12/17/13 1404 Last data filed at 12/17/13 1230  Gross per 24 hour  Intake      0 ml  Output   3000 ml  Net  -3000 ml   Exam: General: No acute respiratory distress Lungs: Clear to auscultation bilaterally without wheezes or crackles but BS quite distant  Cardiovascular: Regular rate and rhythm without murmur gallop or rub - heart sounds quite distant  Abdomen: Morbidly obese, nontender, nondistended,  soft, bowel sounds positive, no rebound, no ascites, no appreciable mass  Extremities: No significant cyanosis, clubbing; trace edema bilateral lower extremities  Data Reviewed: Basic Metabolic Panel:  Recent Labs Lab 12/11/13 0454 12/12/13 0350 12/13/13 0545 12/15/13 0734 12/16/13 0530  NA 140 143 143 143 142  K 3.4* 3.0* 3.3* 3.8 3.8  CL 94* 97 97 99 97  CO2 34* 35* 36* 34* 36*  GLUCOSE 112* 120* 98 104* 97  BUN 14 10 16 18 23   CREATININE 1.03 0.79 0.81 0.81 0.80  CALCIUM 8.5 8.4 8.6 8.7 8.6    MG 1.8 1.8 2.0  --   --   PHOS 2.5 2.3 2.5  --   --    Liver Function Tests: No results found for this basename: AST, ALT, ALKPHOS, BILITOT, PROT, ALBUMIN,  in the last 168 hours  CBC:  Recent Labs Lab 12/11/13 0454 12/12/13 0350 12/13/13 0545 12/15/13 0832 12/16/13 0530  WBC 8.6 7.6 6.9 6.5 6.7  HGB 12.7* 12.2* 12.4* 12.9* 12.2*  HCT 41.8 39.8 40.4 42.8 40.4  MCV 89.7 88.1 89.2 89.2 90.0  PLT 111* 121* 123* 128* 128*    Recent Results (from the past 240 hour(s))  CULTURE, BLOOD (ROUTINE X 2)     Status: None   Collection Time    12/09/13 12:26 PM      Result Value Ref Range Status   Specimen Description BLOOD LEFT HAND   Final   Special Requests BOTTLES DRAWN AEROBIC ONLY 5CC   Final   Culture  Setup Time     Final   Value: 12/09/2013 16:48     Performed at [facility]   Culture     Final   Value: NO GROWTH 5 DAYS     Performed at [facility]   Report Status 12/15/2013 FINAL   Final  CULTURE, BLOOD (ROUTINE X 2)     Status: None   Collection Time    12/09/13 12:40 PM      Result Value Ref Range Status   Specimen Description BLOOD BLOOD LEFT FOREARM   Final   Special Requests     Final   Value: BOTTLES DRAWN AEROBIC AND ANAEROBIC 10CC BLUE  5CC RED   Culture  Setup Time     Final   Value: 12/09/2013 16:49     Performed at [facility]   Culture     Final   Value: NO GROWTH 5 DAYS     Performed at [facility]   Report Status 12/15/2013 FINAL   Final     Studies:  Recent x-ray studies have been reviewed in detail by the Attending Physician  Scheduled Meds:  Scheduled Meds: . carvedilol  6.25 mg Oral BID WC  . citalopram  40 mg Oral Daily  . diclofenac  75 mg Oral BID   And  . misoprostol  200 mcg Oral BID  . feeding supplement (PRO-STAT SUGAR FREE 64)  30 mL Oral TID WC  . fluconazole  100 mg Oral Daily  . furosemide  40 mg Oral Daily  . multivitamin with minerals  1 tablet Oral Daily  . nutrition supplement  (JUVEN)  1 packet Oral BID BM  . polyethylene glycol  17 g Oral Daily  . senna-docusate  1 tablet Oral BID  . sodium chloride  10-40 mL Intracatheter Q12H    Time spent on care of this patient: 25 mins   Treylen Gibbs T , MD   [facility]  Office  [phone] Pager - Text Page per Shea Evans as per below:  On-Call/Text Page:      [email or web address]      password [email or web address]  If 7PM-7AM, please contact night-coverage [email or web address] Password [email or web address] 12/17/2013, 2:04 PM   LOS: 11 days

## 2013-12-18 DIAGNOSIS — J9620 Acute and chronic respiratory failure, unspecified whether with hypoxia or hypercapnia: Secondary | ICD-10-CM

## 2013-12-18 DIAGNOSIS — I5030 Unspecified diastolic (congestive) heart failure: Secondary | ICD-10-CM

## 2013-12-18 DIAGNOSIS — I10 Essential (primary) hypertension: Secondary | ICD-10-CM

## 2013-12-18 DIAGNOSIS — E662 Morbid (severe) obesity with alveolar hypoventilation: Secondary | ICD-10-CM

## 2013-12-18 DIAGNOSIS — I498 Other specified cardiac arrhythmias: Secondary | ICD-10-CM

## 2013-12-18 DIAGNOSIS — H02409 Unspecified ptosis of unspecified eyelid: Secondary | ICD-10-CM

## 2013-12-18 DIAGNOSIS — G609 Hereditary and idiopathic neuropathy, unspecified: Secondary | ICD-10-CM

## 2013-12-18 DIAGNOSIS — R7309 Other abnormal glucose: Secondary | ICD-10-CM

## 2013-12-18 LAB — BASIC METABOLIC PANEL
Anion gap: 14 (ref 5–15)
BUN: 27 mg/dL — ABNORMAL HIGH (ref 6–23)
CO2: 31 mEq/L (ref 19–32)
Calcium: 9 mg/dL (ref 8.4–10.5)
Chloride: 96 mEq/L (ref 96–112)
Creatinine, Ser: 0.94 mg/dL (ref 0.50–1.35)
GFR calc Af Amer: >90 mL/min (ref >90)
GFR calc non Af Amer: >90 mL/min (ref >90)
Glucose, Bld: 107 mg/dL — ABNORMAL HIGH (ref 70–99)
Potassium: 4 mEq/L (ref 3.7–5.3)
Sodium: 141 mEq/L (ref 137–147)

## 2013-12-18 LAB — LUTEINIZING HORMONE: LH: <0.1 m[IU]/mL — ABNORMAL LOW (ref 1.5–9.3)

## 2013-12-18 LAB — T3, FREE: T3, Free: 2 pg/mL — ABNORMAL LOW (ref 2.3–4.2)

## 2013-12-18 LAB — FOLLICLE STIMULATING HORMONE: FSH: <0.3 m[IU]/mL — ABNORMAL LOW (ref 1.4–18.1)

## 2013-12-18 LAB — MAGNESIUM: Magnesium: 2.2 mg/dL (ref 1.5–2.5)

## 2013-12-18 LAB — TESTOSTERONE: Testosterone: 69 ng/dL — ABNORMAL LOW (ref 300–890)

## 2013-12-18 MED ORDER — ENOXAPARIN SODIUM 100 MG/ML ~~LOC~~ SOLN
100.0000 mg | SUBCUTANEOUS | Status: DC
  Administered 2013-12-18 – 2013-12-19 (×2): 100 mg via SUBCUTANEOUS
  Filled 2013-12-18 (×3): qty 1

## 2013-12-18 NOTE — Progress Notes (Signed)
POD 8 from transsphenoidal resection of pituitary tumor Doing well without any drainage from nose. Septal splints removed at bedside. Nasal and septal incisions well healed Breathing improved following removal of splints

## 2013-12-18 NOTE — Progress Notes (Signed)
Pharmacy was asked earlier in the week to resume Lovenox for prophylaxis when neurosurgery ok'd. Spoke with Dr. Joya Salm today and he was agreeable to starting Lovenox obesity dosing for prophylaxis.  Renal function is stable.  Entered order for Lovenox 100mg  (0.5mg /kg) subQ q24h as per obesity dosing.   Laryn Venning D. Hazelee Harbold, PharmD, BCPS Clinical Pharmacist Pager: [phone] 12/18/2013 1:39 PM

## 2013-12-18 NOTE — Care Management Note (Signed)
    Page 1 of 1   12/18/2013     2:42:19 PM CARE MANAGEMENT NOTE 12/18/2013  Patient:  [name] [name]   Account Number:  [id number]  Date Initiated:  12/10/2013  Documentation initiated by:  Sandi Mariscal  Subjective/Objective Assessment:   history of chronic abdominal wound with MSSA, MRSA, Group B strep, proteus in the past, now needing biopsy of pituitary tumor     Action/Plan:   await postop status to see if pt able to wean from vent, whether he needs a trach, etc.   Anticipated DC Date:  12/17/2013   Anticipated DC Plan:  SKILLED NURSING FACILITY         Choice offered to / List presented to:             Status of service:  Completed, signed off Medicare Important Message given?  NO (If response is "NO", the following Medicare IM given date fields will be blank) Date Medicare IM given:   Medicare IM given by:   Date Additional Medicare IM given:   Additional Medicare IM given by:    Discharge Disposition:  IP REHAB FACILITY  Per UR Regulation:  Reviewed for med. necessity/level of care/duration of stay  If discussed at [place] of Stay Meetings, dates discussed:   12/11/2013  12/16/2013  12/18/2013    Comments:  1440 12-18-13 Jacqlyn Krauss, RN,BSN [phone] Plan to admit to CIR 12-19-13.   12-17-13 1418 Jacqlyn Krauss, RN,BSN [phone] CM to continue to monitor for disposition needs. CIR has submitted for insurance authorization.

## 2013-12-18 NOTE — Progress Notes (Signed)
Patient ID: [name] [name], male   DOB: [birth year], 48 y.o.   MRN: [id number] Neuro stable. Path pending

## 2013-12-18 NOTE — Progress Notes (Signed)
[place] TEAM 1 - Stepdown/ICU TEAM Progress Note  [name] [name] YBW:[id number] DOB: 1966-06-05 DOA: 12/06/2013 PCP: [name], MD  Admit HPI / Brief Narrative: 48 y/o WM  PMHx HTN, Diastolic CHF, OSA who recently moved here from [facility] and presented to [facility] ER with 2 day hx of HA, blurry vision, & 1 episode of dark emesis. CT evaluation noted sellar & suprasellar mass lesion. Developed hypercarbic respiratory failure in ER requiring bipap. PCCM consulted for ICU admission.   HPI/Subjective: 7/16 per patient just establishing care in this area. Moved from [place]. States approximately 3 months ago large cystic mass removed from RLQ of abdomen and wound VAC placed. See above for admission history of present illness. 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 Wynelle Cleveland spoke with Dr Dagmar Hait who recommended repeating TSH, FT4, ACTH and Cortisol and suggested we watch for DI symptoms and hypotension  - appt with Dr Buddy Duty made for 7/ 24@  8 AM  -In addition have ordered LH/FSH, testosterone panel   Ptosis of R Eye  Slowly improving   Peripheral Neuropathy  Well controlled at present   Hyperglycemia  A1c 5.8 therefore not c/w DM - will stop CBG checks   Acute on Chronic Hypercarbic Respiratory Failure  -due to OSA / OHS c/b post-op narcotics -Has not completed his sleep study.  - will need to try to auto-titrate at home for now per Pulm notes;; will need to check with CSW to determine if covered by insurance-  Hypertension  -BP within ADA/AHA guideline, in fact may be too low for patient when he becomes more active.   Bradycardia  Resolved   Diastolic CHF  -Well compensated at present  - negative balance by about 30 L- control BP to avoid exacerbation -Obtain echocardiogram, none in Epic  Hypokalemia  -Resolved  continue to monitor    Morbid Obesity - Body mass index is 61.22 kg/(m^2).  -has lost 60 lb since Jan of this yr  -In a.m. will consult surgery for evaluation begin process of workup for gastric banding/bypass -Counseled patient that most likely surgeons would want him to heal from this acute episode first.   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 Stann Mainland Botero/ Dr Jovita Gamma, (neurosurgery) Dr.Wesam Kathryne [facility] M.) Dr., Rozetta Nunnery (ENT)    Procedure/Significant Events: 7/04 - Admit with HA, blurry vision, vomiting (dark emesis, ? blood). CT positive for suprasellar mass  7/5 BIPAP overnight  7/6 significant AMS, improved with BiPAP  7/8 To the OR for sellar mass,Trans-sphenoidal resection of the tumor. remained intubated overnight  7/9 Extubated, no BiPAP due to nasal packing  7/10 nasal packing removed per ENT  7/12 [facility] assumes care    Culture 7/4 MRSA by PCR positive 7/7 blood left hand/forearm negative    Antibiotics: Diflucan 7/4 (yeast RLQ abdominal wound)>> Vancomycin 7/7>>7/8    DVT prophylaxis: SCDs    Devices ??  Wound VAC   LINES / TUBES:  7/6 right triple-lumen PICC    Continuous Infusions:   Objective: VITAL SIGNS: Temp: 97.8 F (36.6 C) (07/16 0746) Temp src: Oral (07/16 0746) BP: 145/95 mmHg (07/16 0538) Pulse Rate: 81 (07/16 0538) SPO2; 95% on 4 L via [place] FIO2:   Intake/Output Summary (Last 24 hours) at 12/18/13 0820 Last data filed at 12/18/13 0500   Gross per 24 hour  Intake    240 ml  Output   2400 ml  Net  -2160 ml     Exam: General: A./O. x4, NAD, headache resolved overnight. No acute respiratory distress (while on BiPAP) Lungs: Clear to auscultation bilaterally without wheezes or crackles Cardiovascular: Regular rate and rhythm without murmur gallop or rub normal S1 and S2 Abdomen: Morbidly obese, Nontender, nondistended, soft, bowel sounds positive, no rebound, no ascites, no appreciable mass, wound VAC in place on RLQ (per patient has been there going on 3 months Extremities: No significant cyanosis, clubbing. bilateral lower edema of extremities to midthigh.  Data Reviewed: Basic Metabolic Panel:  Recent Labs Lab 12/12/13 0350 12/13/13 0545 12/15/13 0734 12/16/13 0530  NA 143 143 143 142  K 3.0* 3.3* 3.8 3.8  CL 97 97 99 97  CO2 35* 36* 34* 36*  GLUCOSE 120* 98 104* 97  BUN 10 16 18 23   CREATININE 0.79 0.81 0.81 0.80  CALCIUM 8.4 8.6 8.7 8.6  MG 1.8 2.0  --   --   PHOS 2.3 2.5  --   --    Liver Function Tests: No results found for this basename: AST, ALT, ALKPHOS, BILITOT, PROT, ALBUMIN,  in the last 168 hours No results found for this basename: LIPASE, AMYLASE,  in the last 168 hours No results found for this basename: AMMONIA,  in the last 168 hours CBC:  Recent Labs Lab 12/12/13 0350 12/13/13 0545 12/15/13 0832 12/16/13 0530  WBC 7.6 6.9 6.5 6.7  HGB 12.2* 12.4* 12.9* 12.2*  HCT 39.8 40.4 42.8 40.4  MCV 88.1 89.2 89.2 90.0  PLT 121* 123* 128* 128*   Cardiac Enzymes: No results found for this basename: CKTOTAL, CKMB, CKMBINDEX, TROPONINI,  in the last 168 hours BNP (last 3 results) No results found for this basename: PROBNP,  in the last 8760 hours CBG:  Recent Labs Lab 12/16/13 1125  GLUCAP 92    Recent Results (from the past 240 hour(s))  CULTURE, BLOOD (ROUTINE X 2)     Status: None   Collection Time    12/09/13 12:26 PM      Result Value Ref Range Status   Specimen Description  BLOOD LEFT HAND   Final   Special Requests BOTTLES DRAWN AEROBIC ONLY 5CC   Final   Culture  Setup Time     Final   Value: 12/09/2013 16:48     Performed at [facility]   Culture     Final   Value: NO GROWTH 5 DAYS     Performed at [facility]   Report Status 12/15/2013 FINAL   Final  CULTURE, BLOOD (ROUTINE X 2)     Status: None   Collection Time    12/09/13 12:40 PM      Result Value Ref Range Status   Specimen Description BLOOD BLOOD LEFT FOREARM   Final   Special Requests     Final   Value: BOTTLES DRAWN AEROBIC AND ANAEROBIC 10CC BLUE  5CC RED   Culture  Setup Time  Final   Value: 12/09/2013 16:49     Performed at [facility]   Culture     Final   Value: NO GROWTH 5 DAYS     Performed at [facility]   Report Status 12/15/2013 FINAL   Final     Studies:  Recent x-ray studies have been reviewed in detail by the Attending Physician  Scheduled Meds:  Scheduled Meds: . carvedilol  6.25 mg Oral BID WC  . citalopram  40 mg Oral Daily  . diclofenac  75 mg Oral BID   And  . misoprostol  200 mcg Oral BID  . feeding supplement (PRO-STAT SUGAR FREE 64)  30 mL Oral TID WC  . fluconazole  100 mg Oral Daily  . furosemide  40 mg Oral Daily  . multivitamin with minerals  1 tablet Oral Daily  . nutrition supplement (JUVEN)  1 packet Oral BID BM  . polyethylene glycol  17 g Oral Daily  . senna-docusate  1 tablet Oral BID  . sodium chloride  10-40 mL Intracatheter Q12H    Time spent on care of this patient: 40 mins   Allie Bossier , MD   [facility] Office  [phone] Pager - [phone]  On-Call/Text Page:      [email or web address]      password [email or web address]  If 7PM-7AM, please contact night-coverage [email or web address] Password [email or web address] 12/18/2013, 8:20 AM   LOS: 12 days

## 2013-12-18 NOTE — Progress Notes (Signed)
PULMONARY / CRITICAL CARE MEDICINE   Name: [name] [name] MRN: [id number] DOB: 1965-11-18    ADMISSION DATE:  12/06/2013  REFERRING MD :  Dr. Doy Mince PRIMARY SERVICE: [facility]  CHIEF COMPLAINT:  Hypercarbic respiratory failure   BRIEF PATIENT DESCRIPTION: 48 y/o M with a PMH of HTN, dCHF, OSA who presented to [facility] ER with 2 day hx of HA, blurry vision, & 1 episode of dark emesis (? If blood).  CT evaluation noted sellar & suprasellar mass lesion.  Developed hypercarbic respiratory failure in ER requiring bipap.  PCCM consulted for ICU admission.   SIGNIFICANT EVENTS: 7/04 - Admit with HA, blurry vision, vomiting (dark emesis, ? blood). CT positive for suprasellar mass 7/5 BIPAP overnight 7/6 significant AMS, improving with BiPAP 7/8 To the OR for sellar mass, remained intubated overnight 7/9 Extubated, no BiPAP due to nasal packing  STUDIES:  7/04 - CT head >> prominent, large sellar & suprasellar mass lesion with extension into the R cavernous sinus, ssuperior extension beyond the diaphragm sella. Osseous destruction with extension into sphenoid sinus.  LINES / TUBES: R PICC line 7/6>>> ETT 7/8>>>7/9  CULTURES: Blood 7/7>>>  ANTIBIOTICS: Diflucan (yeast RLQ wound) 7/4 >>> Vancomycin 7/7 >>> 7/9  INTERVAL HISTORY:  Tolerating auto-BiPAP well  VITAL SIGNS: Temp:  [97.4 F (36.3 C)-97.9 F (36.6 C)] 97.8 F (36.6 C) (07/16 0746) Pulse Rate:  [71-97] 71 (07/16 0749) Resp:  [13-20] 13 (07/16 0749) BP: (114-148)/(72-95) 135/84 mmHg (07/16 0749) SpO2:  [97 %-100 %] 100 % (07/16 0749) Weight:  [199 kg (438 lb 11.5 oz)] 199 kg (438 lb 11.5 oz) (07/16 0500)  INTAKE / OUTPUT: Intake/Output     07/15 0701 - 07/16 0700 07/16 0701 - 07/17 0700   P.O. 240    Total Intake(mL/kg) 240 (1.2)    Urine (mL/kg/hr) 2450 (0.5)    Total Output 2450     Net -2210          Stool Occurrence 2 x     PHYSICAL EXAMINATION:   Gen: No distress HEENT: PERRL PULM: Bilateral diminished air  entry, no added sounds CV: Regular, distant  AB: Obese, soft Ext: Bilateral edema Derm: R abdominal wound vac Neuro: Awake, alert, cooperative  LABS:  CBC  Recent Labs Lab 12/13/13 0545 12/15/13 0832 12/16/13 0530  WBC 6.9 6.5 6.7  HGB 12.4* 12.9* 12.2*  HCT 40.4 42.8 40.4  PLT 123* 128* 128*   Coag's No results found for this basename: APTT, INR,  in the last 168 hours BMET  Recent Labs Lab 12/13/13 0545 12/15/13 0734 12/16/13 0530  NA 143 143 142  K 3.3* 3.8 3.8  CL 97 99 97  CO2 36* 34* 36*  BUN 16 18 23   CREATININE 0.81 0.81 0.80  GLUCOSE 98 104* 97   Electrolytes  Recent Labs Lab 12/12/13 0350 12/13/13 0545 12/15/13 0734 12/16/13 0530  CALCIUM 8.4 8.6 8.7 8.6  MG 1.8 2.0  --   --   PHOS 2.3 2.5  --   --    Sepsis Markers No results found for this basename: LATICACIDVEN, PROCALCITON, O2SATVEN,  in the last 168 hours ABG  Recent Labs Lab 12/13/13 0500  PHART 7.415  PCO2ART 57.7*  PO2ART 94.0   Liver Enzymes No results found for this basename: AST, ALT, ALKPHOS, BILITOT, ALBUMIN,  in the last 168 hours  Cardiac Enzymes No results found for this basename: TROPONINI, PROBNP,  in the last 168 hours  Glucose  Recent Labs Lab 12/16/13 1125  GLUCAP 92   IMAGING: No results found.  ASSESSMENT / PLAN:  Morbid obesity OSA / OHS Chronic hypercarbic respiratory failure   Supplemental oxygen for SpO=>92  Auto-BiPAP qhs  Mobilize as able  Avoid sedatives / hypnotics  Polysomnography as outpatient  F/u appointment with Dr. Halford Chessman  PCCM will sign off, please re consult PRN  I have personally obtained history, examined patient, evaluated and interpreted laboratory and imaging results, reviewed medical records, formulated assessment / plan and placed orders.  Doree Fudge, MD Pulmonary and [place] Pager: [phone]  12/18/2013, 11:42 AM

## 2013-12-18 NOTE — Progress Notes (Signed)
I met with pt at bedside. We have [employer] [place] approval to admit to inpt rehab. Discussed with Dr. Sherral Hammers. I will follow up tomorrow with possible admit tomorrow pending medical workup completion. [phone]

## 2013-12-18 NOTE — Progress Notes (Signed)
Occupational Therapy Treatment Patient Details Name: [name] [name] MRN: [id number] DOB: [birth year] Today's Date: 12/18/2013    History of present illness HPI: 48 y/o M with a PMH of HTN, dCHF, OSA who presented to [facility] ER with 2 day hx of HA, blurry vision, & 1 episode of dark emesis (? If blood). CT evaluation noted sellar & suprasellar mass lesion. Developed hypercarbic respiratory failure in ER requiring bipap. On 7/8, taken to OR for transphenoidal resection of pituitary tumor. Returned to ICU on vent for ~1day.  Patient now off vent and therapy orders received.   OT comments  Making excellent progress. Diplopia improving with increased motility of R eye in CN III distribution. Ptopsis also improved. Spot on R lens adjusted as needed to diminish double image. Pt making progress with ADL and functional mobility with ADL. Pt very motivated to return to higher level of independence. Will continue to follow.  Follow Up Recommendations  CIR;Supervision/Assistance - 24 hour    Equipment Recommendations  None recommended by OT    Recommendations for Other Services Rehab consult    Precautions / Restrictions Precautions Precautions: Fall Precaution Comments: wound vac       Mobility Bed Mobility Overal bed mobility: Needs Assistance Bed Mobility: Sit to Supine Rolling: Supervision         General bed mobility comments: heavily using rails to assist with mobility  Transfers Overall transfer level: Needs assistance Equipment used: Rolling walker (2 wheeled) Transfers: Sit to/from Stand;Stand Pivot Transfers Sit to Stand: +2 safety/equipment;Min assist Stand pivot transfers: Min assist;+2 safety/equipment       General transfer comment: 2nd person for safety only    Balance Overall balance assessment: Needs assistance   Sitting balance-Leahy Scale: Good     Standing balance support: During functional activity Standing balance-Leahy Scale: Fair                      ADL Overall ADL's : Needs assistance/impaired                         Toilet Transfer: Minimal assistance;Stand-pivot;+2 for safety/equipment Toilet Transfer Details (indicate cue type and reason): bariactirc BSC Toileting- Clothing Manipulation and Hygiene: Set up;Sitting/lateral lean       Functional mobility during ADLs: Minimal assistance;Rolling walker;+2 for safety/equipment General ADL Comments: Making excellent progress. Demonstrating progress with endurance for ADL      Vision     Ocular Range of Motion: Restricted on the left;Restricted looking up;Restricted looking down (restricted adduction) Tracking/Visual Pursuits: Decreased smoothness of horizontal tracking;Decreased smoothness of vertical tracking Saccades: Decreased speed of saccadic movement Convergence: Impaired (comment) Diplopia Assessment: Disappears with one eye closed;Objects split side to side   Additional Comments: R eye motility improving. Pt now able to crodd midline axis with R eye and ptosis is improving. Spot occlusion moved to decrease diplopia. Pt completing gaze stabilization exercises and activities to encourage convergence   Perception     Praxis      Cognition   Behavior During Therapy: [facility] for tasks assessed/performed Overall Cognitive Status: Within Functional Limits for tasks assessed                       Extremity/Trunk Assessment               Exercises Other Exercises Other Exercises: BUE theraband   Shoulder Instructions       General Comments  Pertinent Vitals/ Pain       VSS. No c/o pain  Home Living                                          Prior Functioning/Environment              Frequency Min 3X/week     Progress Toward Goals  OT Goals(current goals can now be found in the care plan section)  Progress towards OT goals: Progressing toward goals  Acute Rehab OT Goals Patient Stated Goal: to be able  to walk again OT Goal Formulation: With patient Time For Goal Achievement: 12/26/13 Potential to Achieve Goals: Good ADL Goals Pt Will Perform Lower Body Dressing: with min assist;with adaptive equipment;sit to/from stand Pt Will Transfer to Toilet: with min assist;bedside commode;stand pivot transfer Pt Will Perform Toileting - Clothing Manipulation and hygiene: with min assist;with adaptive equipment;sit to/from stand;sitting/lateral leans Pt/caregiver will Perform Home Exercise Program: Both right and left upper extremity;With theraband Additional ADL Goal #1: Pt will verbalize understanding of use of "spot occlusion" to reduce symptoms of diplopia to increase independence with ADL/mobility  Plan Discharge plan remains appropriate    Co-evaluation                 End of Session Equipment Utilized During Treatment: Gait belt;Rolling walker   Activity Tolerance Patient tolerated treatment well   Patient Left in bed;with call bell/phone within reach   Nurse Communication Mobility status        Time: 9629-5284 OT Time Calculation (min): 47 min  Charges: OT General Charges $OT Visit: 1 Procedure OT Treatments $Self Care/Home Management : 23-37 mins $Therapeutic Activity: 8-22 mins  Reign Dziuba,HILLARY 12/18/2013, 4:34 PM   [facility], OTR/L  [phone] 12/18/2013

## 2013-12-18 NOTE — Progress Notes (Signed)
Pt not ready to go on CPAP at this time, asked to wait until later to be put on. RT will check back with pt at a later time to place on CPAP

## 2013-12-19 DIAGNOSIS — Z8619 Personal history of other infectious and parasitic diseases: Secondary | ICD-10-CM

## 2013-12-19 DIAGNOSIS — I059 Rheumatic mitral valve disease, unspecified: Secondary | ICD-10-CM

## 2013-12-19 DIAGNOSIS — E230 Hypopituitarism: Secondary | ICD-10-CM

## 2013-12-19 DIAGNOSIS — L03319 Cellulitis of trunk, unspecified: Secondary | ICD-10-CM

## 2013-12-19 DIAGNOSIS — L02219 Cutaneous abscess of trunk, unspecified: Secondary | ICD-10-CM

## 2013-12-19 DIAGNOSIS — I5033 Acute on chronic diastolic (congestive) heart failure: Secondary | ICD-10-CM

## 2013-12-19 LAB — BASIC METABOLIC PANEL
Anion gap: 9 (ref 5–15)
BUN: 31 mg/dL — ABNORMAL HIGH (ref 6–23)
CO2: 33 mEq/L — ABNORMAL HIGH (ref 19–32)
Calcium: 8.6 mg/dL (ref 8.4–10.5)
Chloride: 100 mEq/L (ref 96–112)
Creatinine, Ser: 0.96 mg/dL (ref 0.50–1.35)
GFR calc Af Amer: >90 mL/min (ref >90)
GFR calc non Af Amer: >90 mL/min (ref >90)
Glucose, Bld: 108 mg/dL — ABNORMAL HIGH (ref 70–99)
Potassium: 3.8 mEq/L (ref 3.7–5.3)
Sodium: 142 mEq/L (ref 137–147)

## 2013-12-19 LAB — MAGNESIUM: Magnesium: 2.2 mg/dL (ref 1.5–2.5)

## 2013-12-19 LAB — TESTOSTERONE, % FREE: Testosterone-% Free: 2.5 % — ABNORMAL HIGH (ref 1.6–2.9)

## 2013-12-19 LAB — SEX HORMONE BINDING GLOBULIN: Sex Hormone Binding: 18 nmol/L (ref 13–71)

## 2013-12-19 LAB — TESTOSTERONE, FREE: Testosterone, Free: 17.1 pg/mL — ABNORMAL LOW (ref 47.0–244.0)

## 2013-12-19 MED ORDER — TESTOSTERONE CYPIONATE 200 MG/ML IM SOLN
100.0000 mg | INTRAMUSCULAR | Status: DC
  Administered 2013-12-19: 100 mg via INTRAMUSCULAR
  Filled 2013-12-19: qty 1

## 2013-12-19 MED ORDER — LEVOTHYROXINE SODIUM 50 MCG PO TABS
50.0000 ug | ORAL_TABLET | Freq: Every day | ORAL | Status: DC
  Administered 2013-12-19 – 2013-12-20 (×2): 50 ug via ORAL
  Filled 2013-12-19 (×3): qty 1

## 2013-12-19 MED ORDER — TESTOSTERONE ENANTHATE 200 MG/ML IM SOLN: 100.0000 mg | INTRAMUSCULAR | Status: DC

## 2013-12-19 NOTE — Progress Notes (Addendum)
Pt getting ready for bed, and is ready to go on his BiPAP. RT set up equipment in the room and the nurse will put pt on when he is ready. I instructed pt and nurse to call if they needed anything.

## 2013-12-19 NOTE — Progress Notes (Signed)
PT Cancellation Note  Patient Details Name: [name] [name] MRN: [id number] DOB: April 24, 1966   Cancelled Treatment:    Reason Eval/Treat Not Completed: Patient declined, no reason specified ("Just learned he has brain cancer" per mother).  Patient asked that we please wait until tomorrow for PT.   Despina Pole 12/19/2013, 6:32 PM Carita Pian. Sanjuana Kava, [place] Pager [phone]

## 2013-12-19 NOTE — Progress Notes (Signed)
Echocardiogram 2D Echocardiogram has been performed.  [name] [name] 12/19/2013, 12:01 PM

## 2013-12-19 NOTE — PMR Pre-admission (Signed)
PMR Admission Coordinator Pre-Admission Assessment  Patient: [name] [name] is an 48 y.o., male MRN: [id number] DOB: [birth year] Height: 5' 11"  (180.3 cm) Weight: 200 kg (440 lb 14.7 oz)              Insurance Information HMO:     PPO: yes     PCP:      IPA:      80/20:      OTHER:  PRIMARY: [name] [name]      Policy#: [id number]      Subscriber: pt CM Name: Melody    Phone#: [phone]     Fax#: [phone] Pre-Cert#: [id number] update due when CM calls back and clarifies    Employer: [employer] from his previous job Benefits:  Phone #: [phone]     Name: 12/17/13 Lear Ng. Date: 07/06/13     Deduct: $1500/met      Out of Pocket Max: $4000/met      Life Max: none CIR: 80%      SNF: 80$ Outpatient: $40 copay per visit     Co-Pay: 20 visits combined Home Health: 100%      Co-Pay: 120 visits per year DME: 80%     Co-Pay: 20% Providers: in network  SECONDARY: none  Medicaid Application Date:       Case Manager:  Disability Application Date:       Case Worker:  Pt states he is receiving long term disability through his work. Has not began federal disability application and would like assistance to begin if appropriate.  Emergency Contact Information Contact Information   Name Relation Home Work Mobile   [place] Mother [phone]       Current Medical History  Patient Admitting Diagnosis: deconditioning after multiple medical, pituitary tumor resection  History of Present Illness: [name] [name] is a 48 y.o. male with history of HTN, OSA, diastolic CHF, morbid obesity, chronic abdominal wound with recent abdominal wall abscess s/p I and D 06/06/13 (with prolonged hospitalization and CIR stay 08/2013 in [place]) who was admitted on 12/06/13 with 2 day history of HA, blurry vision with right ptosis, hematemesis, and hypercarbic respiratory failure.   He was transferred from [facility] to [facility] for management as CT head revealed prominent, large sellar & suprasellar mass  lesion with extension into the R cavernous sinus, ssuperior extension beyond the diaphragm sella. Osseous destruction with extension into sphenoid sinus. He was placed on BIPAP and clear liquid diet. Dr. Doy Mince consulted for input and labs done revealing ACTH 135, FH <0.1, FSH < 0.3, TSH 0.137, Free T4- 0.81 and Cortisol level 31.2.  Dr. Joya Salm and [facility] consulted for input on surgical intervention. Dr. Tommy Medal and Dr. Georgette Dover consulted for input on chronic abdominal wound and recommended continuing fluconazole and no need for antibiotics as no signs of infection noted. To continue with VAC and cleared to proceed with surgery per [facility] input.   He has had issues with anxiety requiring BIPAP for labored breathing. On 12/10/13, he underwent transphenoidal resection of pituitary tumor by Dr Radene Journey and Dr. Joya Salm. He was extubated on 07/09 and cleared for regular diet. Last cortisol level remains elevated at 21.1 and Endocrine follow up recommended for input. Patient continues to be limited by diplopia, generalized weakness, pain as well as body habitus.    Patient was independent for stand pivot transfer PTA. Was in process of getting motorized WC due to ortho (Drs Murphy/Olin) recommendations to limit mobility to transfers only for now secondary to  concerns of fall/fracture. He was in process of losing weight in order to have B-TKR in the futures and has lost 60 lbs in past 6 months.   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 Wynelle Cleveland spoke with Dr Dagmar Hait who recommended repeating TSH, FT4, ACTH and Cortisol and suggested we watch for DI symptoms and hypotension . - appt with Dr Buddy Duty made for 7/ 24@ 8 AM  -In addition have ordered LH/FSH, testosterone panel  -7/17 spoke with Dr. 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 Specimen has been sent to [facility] for second  opinion. 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 -Start Synthroid 50 mcg daily, rehabilitation/PCP to titrate to effect  -start Testosterone 100 mg IM q 14 days  -Repeat ACTH/cortisol pending, however previous cortisol was within normal limits so will hold off on starting hydrocortisone at this time. Most likely patient will need hydrocortisone in the future.   Past Medical History  Past Medical History  Diagnosis Date  . Sleep apnea   . Allergic rhinitis   . Diastolic CHF   . Gout   . Nephrolithiasis   . Peripheral neuropathy   . Hypertension     Family History  family history includes Gout in his brother and father; Hypertension in his father and mother; Hypothyroidism in his father.  Prior Rehab/Hospitalizations: CIR in [place] after long hospitalization for 10 days and d/c'd to Mom's home for 24/7 assist. Pt states he did not collaborate with therapists a lot in goal setting in [place]. I have encouraged him to speak up and state his needs in goal setting to get the most out of his inpt rehab stay. To ask questions and to assist in his overall plan of care.   Current Medications  Current facility-administered medications:0.9 %  sodium chloride infusion, 250 mL, Intravenous, PRN, Donita Brooks, NP, Last Rate: 10 mL/hr at 12/14/13 0600, 250 mL at 12/14/13 0600;  ALPRAZolam (XANAX) tablet 0.25 mg, 0.25 mg, Oral, Q4H PRN, Rush Farmer, MD, 0.25 mg at 12/18/13 2103;  bisacodyl (DULCOLAX) suppository 10 mg, 10 mg, Rectal, Daily PRN, Cherene Altes, MD carvedilol (COREG) tablet 6.25 mg, 6.25 mg, Oral, BID WC, Cherene Altes, MD, 6.25 mg at 12/19/13 0831;  citalopram (CELEXA) tablet 40 mg, 40 mg, Oral, Daily, Erick Colace, NP, 40 mg at 12/19/13 1042;  diclofenac (VOLTAREN) EC tablet 75 mg, 75 mg, Oral, BID, Cherene Altes, MD, 75 mg at 12/19/13 1041;  enoxaparin (LOVENOX)  injection 100 mg, 100 mg, Subcutaneous, Q24H, Floyce Stakes, MD, 100 mg at 12/18/13 1551 feeding supplement (PRO-STAT SUGAR FREE 64) liquid 30 mL, 30 mL, Oral, TID WC, Heather Cornelison Pitts, RD;  fentaNYL (SUBLIMAZE) injection 25-50 mcg, 25-50 mcg, Intravenous, Q2H PRN, Juanito Doom, MD, 50 mcg at 12/19/13 0536;  fluconazole (DIFLUCAN) tablet 100 mg, 100 mg, Oral, Daily, Truman Hayward, MD, 100 mg at 12/19/13 1041;  furosemide (LASIX) tablet 40 mg, 40 mg, Oral, Daily, Cherene Altes, MD, 40 mg at 12/19/13 1041 lactulose (CHRONULAC) 10 GM/15ML solution 30 g, 30 g, Oral, BID PRN, Cherene Altes, MD;  levothyroxine (SYNTHROID, LEVOTHROID) tablet 50 mcg, 50 mcg, Oral, QAC breakfast, Allie Bossier, MD;  lidocaine (XYLOCAINE) 4 % external solution, , Topical, Daily PRN, Rush Farmer, MD, 5 mL at  12/08/13 0954;  magnesium citrate solution 1 Bottle, 1 Bottle, Oral, Daily PRN, Cherene Altes, MD misoprostol (CYTOTEC) tablet 200 mcg, 200 mcg, Oral, BID, Cherene Altes, MD, 200 mcg at 12/19/13 1041;  multivitamin with minerals tablet 1 tablet, 1 tablet, Oral, Daily, Cherene Altes, MD, 1 tablet at 12/19/13 1042;  nutrition supplement (JUVEN) (JUVEN) powder packet 1 packet, 1 packet, Oral, BID BM, Heather Cornelison Pitts, RD, 1 packet at 12/19/13 1042 ondansetron (ZOFRAN) injection 4 mg, 4 mg, Intravenous, Q6H PRN, Donita Brooks, NP, 4 mg at 12/17/13 2312;  oxyCODONE-acetaminophen (PERCOCET/ROXICET) 5-325 MG per tablet 1 tablet, 1 tablet, Oral, Q4H PRN, Juanito Doom, MD, 1 tablet at 12/19/13 0851;  polyethylene glycol (MIRALAX / GLYCOLAX) packet 17 g, 17 g, Oral, Daily, Cherene Altes, MD, 17 g at 12/16/13 1008 senna-docusate (Senokot-S) tablet 1 tablet, 1 tablet, Oral, BID, Cherene Altes, MD, 1 tablet at 12/17/13 2300;  sodium chloride 0.9 % injection 10-40 mL, 10-40 mL, Intracatheter, Q12H, Juanito Doom, MD, 30 mL at 12/18/13 2107;  testosterone cypionate  (DEPOTESTOTERONE CYPIONATE) injection 100 mg, 100 mg, Intramuscular, Q14 Days, Allie Bossier, MD  Patients Current Diet: Cardiac  Precautions / Restrictions Precautions Precautions: Fall Precaution Comments: wound vac Restrictions Weight Bearing Restrictions: No   Prior Activity Level Limited Community (1-2x/wk): has been in [place] with parents for 3 months after being at an inpt rehab in [place] for 10 days  [place] / [place] Devices/Equipment: Environmental consultant (specify type);Hospital bed;Bedside commode/3-in-1;Wheelchair Home Equipment: Gilford Rile - 2 wheels;Wheelchair - [employer];Hospital bed (working with MD on trying to get power chair) Was working with Dr. Halford Chessman, pulmonoligist, pta on getting at home sleep study to qualify for home CPAP/Bipap. Was not on home O2 pta  Prior Functional Level Prior Function Level of Independence: Independent with assistive device(s) Comments: ; currently has wound vac R lateral lower abdomen. States he has had vac since January.; wound carea nurse comes out to house 2 days/week. therapists hadjust D/C ed pt.   Current Functional Level Cognition  Overall Cognitive Status: Within Functional Limits for tasks assessed Orientation Level: Oriented X4    Extremity Assessment (includes Sensation/Coordination)          ADLs  Overall ADL's : Needs assistance/impaired Eating/Feeding: Modified independent (liquid diet) Grooming: Set up;Sitting Upper Body Bathing: Minimal assitance;Sitting Lower Body Bathing: Maximal assistance;Sit to/from stand Upper Body Dressing : Moderate assistance;Sitting Lower Body Dressing: Maximal assistance;Sit to/from stand Toilet Transfer: Minimal assistance;Stand-pivot;+2 for safety/equipment Toilet Transfer Details (indicate cue type and reason): bariactirc BSC Toileting- Clothing Manipulation and Hygiene: Set up;Sitting/lateral lean Toileting - Clothing Manipulation Details (indicate cue  type and reason): foley Functional mobility during ADLs: Minimal assistance;Rolling walker;+2 for safety/equipment General ADL Comments: Making excellent progress. Demonstrating progress with endurance for ADL    Mobility  Overal bed mobility: Needs Assistance Bed Mobility: Sit to Supine Rolling: Supervision Sidelying to sit: Supervision General bed mobility comments: heavily using rails to assist with mobility    Transfers  Overall transfer level: Needs assistance Equipment used: Rolling walker (2 wheeled) Transfers: Sit to/from Stand;Stand Pivot Transfers Sit to Stand: +2 safety/equipment;Min assist Stand pivot transfers: Min assist;+2 safety/equipment General transfer comment: 2nd person for safety only    Ambulation / Gait / Stairs / Emergency planning/management officer  Ambulation/Gait Ambulation/Gait assistance: Museum/gallery curator (Feet): 12 Feet Assistive device: Rolling walker (2 wheeled) Gait Pattern/deviations: Step-to pattern;Shuffle;Wide base of support General Gait Details: able to take steps across room  and then perform steps to turn 180 to chair    Posture / Balance Dynamic Sitting Balance Sitting balance - Comments: tolerated sitting EOB >20 minutes static and dynamic movements tolerated in sitting EOB    Special needs/care consideration BiPAP/CPAP NEW to BIPAP this admission. Continuous Drip IV no Oxygen O2 at 2 to 4 liters continuous during the day until 7/17 and RN is assessing O2 sats on room air today Special Bed bariatric with overlay mattress Wound Vac (area) see below Pt seen by [facility] nurse since admit. Pt with chronic wound in RLQ. VAC three times per week with HHRN from [facility]. Also followed outpt wound care center at [facility] with Dr. Lin Landsman.  Skin 7/15 WOC follow up non healing surgical wound. Measurement 2.o cm x 11 cm x 7 cm with tract at 2 o'clock that is 10 cm.wound bed beefy red, granualtion but deeper in the wound bed it is nongranular, pink and moist.   WOC wound follow up 12/19/2013 Sponge in the wound bed injected with 10cc Lidocaine 30 minutes prior to dressing removal and the bedside nurse has administered PO pain meds.  Wound type: NPWT VAC dressing; RLQ abdominal wound  Measurement: see 12/17/13 note  Wound bed: clean, granulation tissue present, but more pale and non granular in the base  Drainage (amount, consistency, odor) slight odor today ,however I believe more related to canister that needed to be changed. After cleansing odor is not present  Periwound: new area of denudation from 5-8 o'clock, does not appear to be candida overgrowth like before. Some contact dermatitis related to use of VAC drape.  Dressing procedure/placement/frequency:  Dressing removed and the periwound cleansed thoroughly and dried well. Antifungal powder used and no sting skin prep used to crust the periwound skin. Long strip of black foam used to fill down into the tunneled area and then the remainder of the wound bed. Skin protected with drape for a bridge of black foam away from wound. Seal at 128mHG , pt tolerated without problems.  WOC will follow upon transfer to the inpatient rehab setting  [facility] [place] [phone]  Bowel mgmt:continent Bladder mgmt:continent Diabetic mgmt Hgb A1C 5.8 when checked not c/w DM, so CBGS discontinued PER PCCMMorbid obesity,OSA / OHS,Chronic hypercarbic respiratory failure  Supplemental oxygen for SpO=>92  Auto-BiPAP qhs  Mobilize as able  Avoid sedatives / hypnotics  Polysomnography as outpatient  F/u appointment with Dr. SHalford Chessman  Previous Home Environment Living Arrangements: Parent  Lives With: Other (Comment) (parent, Mom , PEvent organiser Available Help at Discharge: Family;Available 24 hours/day Type of Home: House Home Layout: One level Home Access: Ramped entrance Entrance Stairs-Rails: Right Entrance Stairs-Number of Steps: ramp entrance Bathroom Shower/Tub: TChiropodist  Standard Bathroom Accessibility: Yes How Accessible: Accessible via walker Home Care Services: Yes [place] of [place] [facility] Additional Comments: Has ordered powerred w/c  Discharge Living Setting Plans for Discharge Living Setting: Lives with (comment);Other (Comment) (plans to return home with his Mom, Patty) Type of Home at Discharge: House Discharge Home Layout: One level Discharge Home Access: Stairs to enter;Ramped entrance Entrance Stairs-Rails: Right Entrance Stairs-Number of Steps: ramp entrance Discharge Bathroom Shower/Tub: Tub/shower unit Discharge Bathroom Toilet: Standard Discharge Bathroom Accessibility: Yes How Accessible: Accessible via walker Does the patient have any problems obtaining your medications?: No  Social/Family/Support Systems Patient Roles: Other (Comment) (was on short term disability and the let go from his job, Ma) CSport and exercise psychologistInformation: PEvent organiser  [place], Mom Anticipated Caregiver: Mom Anticipated Caregiver's Contact Information: [phone] Ability/Limitations of Caregiver: Mom has BLE edema with her own difficulty walking distance. Transport chair to pt room to visit Caregiver Availability: 24/7 Discharge Plan Discussed with Primary Caregiver: Yes Is Caregiver In Agreement with Plan?: Yes Does Caregiver/Family have Issues with Lodging/Transportation while Pt is in Rehab?: No  Pt's Mom very involved , but pt directs his own care and informs her.  Pt goals for Mod I to supervision only short distances. His ortho surgeons have told him that it was mor important for strengthening his transfers and losing weight so that he can eventually have knee surgeries. Pt also looking into gastric bypass surgery. He has lost 60 lbs since Jan. 2015.  Pt wants surgeon outpt appt to be arranged to assess for gastric bypass/banding.  Goals/Additional Needs Patient/Family Goal for Rehab: Mod I to S PT, Mod I to Min  OT Expected length of stay: [facility] 8 to 13 days Equipment Needs: Bariatric Equipment Special Service Needs: pt highly anxious and at times gets claustrophobic. Bew to BiPap this admission. Had planned at home sleep study through Dr. Halford Chessman pta Pt/Family Agrees to Admission and willing to participate: Yes Program Orientation Provided & Reviewed with Pt/Caregiver Including Roles  & Responsibilities: Yes  Decrease burden of Care through IP rehab admission: n/a  Possible need for SNF placement upon discharge: no  Patient Condition: This patient's medical and functional status has changed since the consult dated: 12/15/2013 in which the Rehabilitation Physician determined and documented that the patient's condition is appropriate for intensive rehabilitative care in an inpatient rehabilitation facility. See "History of Present Illness" (above) for medical update. Functional changes are: overall min to mod assist. Patient's medical and functional status update has been discussed with the Rehabilitation physician and patient remains appropriate for inpatient rehabilitation. Will admit to inpatient rehab today.  Preadmission Screen Completed By:  Cleatrice Burke, 12/19/2013 1:55 PM ______________________________________________________________________   Discussed status with Dr. Naaman Plummer on 12/19/2013 at  1356 and received telephone approval for admission today.  Admission Coordinator:  Cleatrice Burke, time 1448  Date 12/19/2013

## 2013-12-19 NOTE — Progress Notes (Signed)
[place] TEAM 1 - Stepdown/ICU TEAM Progress Note  Samaad Burklow IZT:[id number] DOB: [birth year] DOA: 12/06/2013 PCP: [name], MD  Admit HPI / Brief Narrative: 48 y/o WM  PMHx HTN, Diastolic CHF, OSA who recently moved here from [facility] and presented to [facility] ER with 2 day hx of HA, blurry vision, & 1 episode of dark emesis. CT evaluation noted sellar & suprasellar mass lesion. Developed hypercarbic respiratory failure in ER requiring bipap. PCCM consulted for ICU admission.   HPI/Subjective: 7/16 per patient just establishing care in this area. Moved from [place]. States approximately 3 months ago large cystic mass removed from RLQ of abdomen and wound VAC placed. See above for admission history of present illness. 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 Wynelle Cleveland spoke with Dr Dagmar Hait who recommended repeating TSH, FT4, ACTH and Cortisol and suggested we watch for DI symptoms and hypotension  - appt with Dr Buddy Duty made for 7/ 24@  8 AM  -In addition have ordered LH/FSH, testosterone panel -7/17 spoke with Dr. 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 Most likely patient will need hydrocortisone in the future.  Ptosis of R Eye  Slowly improving   Peripheral Neuropathy  Well controlled at present   Hyperglycemia  A1c 5.8 therefore  not c/w DM - will stop CBG checks   Acute on Chronic Hypercarbic Respiratory Failure  -due to OSA / OHS c/b post-op narcotics -Has not completed his sleep study.  - Will need to try to auto-titrate at home for now per Pulm notes, rehabilitation to start this process.   Hypertension  -BP slightly outside ADA/AHA guideline, but given that patient has been running low over the last several days we'll hold off on starting BP medication now and trend.    Bradycardia  Resolved   Diastolic CHF  -Well compensated at present  - negative 31 L - control BP to avoid exacerbation -Echocardiogram, results pending  Hypokalemia  -Resolved continue to monitor    Morbid Obesity - Body mass index is 61.22 kg/(m^2).  -has lost 60 lb since Jan of this yr  -Since patient will be discharged to [facility] will request they ensure patient has followup with surgeons prior to discharge from the facility. In order to facilitate  workup for gastric banding/bypass -Counseled patient that most likely surgeons would want him to heal from this acute episode first.   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 Stann Mainland Botero/ Dr Jovita Gamma, (neurosurgery) Dr.Wesam Kathryne [facility]  M.) Dr., Rozetta Nunnery (ENT)    Procedure/Significant Events: 7/04 - Admit with HA, blurry vision, vomiting (dark emesis, ? blood). CT positive for suprasellar mass  7/5 BIPAP overnight  7/6 significant AMS, improved with BiPAP  7/8 To the OR for sellar  mass,Trans-sphenoidal resection of the tumor. remained intubated overnight  7/9 Extubated, no BiPAP due to nasal packing  7/10 nasal packing removed per ENT  7/12 [facility] assumes care    Culture 7/4 MRSA by PCR positive 7/7 blood left hand/forearm negative    Antibiotics: Diflucan 7/4 (yeast RLQ abdominal wound)>> Vancomycin 7/7>>7/8    DVT prophylaxis: SCDs    Devices ??Wound VAC   LINES / TUBES:  7/6 right triple-lumen PICC    Continuous Infusions:   Objective: VITAL SIGNS: Temp: 97.8 F (36.6 C) (07/17 0700) Temp src: Oral (07/17 0700) BP: 148/79 mmHg (07/17 0700) Pulse Rate: 87 (07/17 0420) SPO2; 100% on 4 L via [place] FIO2:   Intake/Output Summary (Last 24 hours) at 12/19/13 1217 Last data filed at 12/19/13 0900  Gross per 24 hour  Intake     30 ml  Output    700 ml  Net   -670 ml     Exam: General: A./O. x4, NAD, negative headache. No acute respiratory distress Lungs: Clear to auscultation bilaterally without wheezes or crackles Cardiovascular: Regular rate and rhythm without murmur gallop or rub normal S1 and S2 Abdomen: Morbidly obese, Nontender, nondistended, soft, bowel sounds positive, no rebound, no ascites, no appreciable mass, wound VAC in place on RLQ (per patient has been there going on 3 months Extremities: No significant cyanosis, clubbing. bilateral lower edema of extremities to midthigh.  Data Reviewed: Basic Metabolic Panel:  Recent Labs Lab 12/13/13 0545 12/15/13 0734 12/16/13 0530 12/18/13 1200 12/19/13 0530  NA 143 143 142 141 142  K 3.3* 3.8 3.8 4.0 3.8  CL 97 99 97 96 100  CO2 36* 34* 36* 31 33*  GLUCOSE 98 104* 97 107* 108*  BUN 16 18 23  27* 31*  CREATININE 0.81 0.81 0.80 0.94 0.96  CALCIUM 8.6 8.7 8.6 9.0 8.6  MG 2.0  --   --  2.2 2.2  PHOS 2.5  --   --   --   --    Liver Function Tests: No results found for this basename: AST, ALT, ALKPHOS, BILITOT, PROT, ALBUMIN,  in the last 168 hours No results found for this  basename: LIPASE, AMYLASE,  in the last 168 hours No results found for this basename: AMMONIA,  in the last 168 hours CBC:  Recent Labs Lab 12/13/13 0545 12/15/13 0832 12/16/13 0530  WBC 6.9 6.5 6.7  HGB 12.4* 12.9* 12.2*  HCT 40.4 42.8 40.4  MCV 89.2 89.2 90.0  PLT 123* 128* 128*   Cardiac Enzymes: No results found for this basename: CKTOTAL, CKMB, CKMBINDEX, TROPONINI,  in the last 168 hours BNP (last 3 results) No results found for this basename: PROBNP,  in the last 8760 hours CBG:  Recent Labs Lab 12/16/13 1125  GLUCAP 92    Recent Results (from the past 240 hour(s))  CULTURE, BLOOD (ROUTINE X 2)     Status: None   Collection Time    12/09/13 12:26 PM      Result Value Ref Range Status   Specimen Description BLOOD LEFT HAND   Final   Special Requests BOTTLES DRAWN AEROBIC ONLY 5CC   Final   Culture  Setup Time     Final  Value: 12/09/2013 16:48     Performed at [facility]   Culture     Final   Value: NO GROWTH 5 DAYS     Performed at [facility]   Report Status 12/15/2013 FINAL   Final  CULTURE, BLOOD (ROUTINE X 2)     Status: None   Collection Time    12/09/13 12:40 PM      Result Value Ref Range Status   Specimen Description BLOOD BLOOD LEFT FOREARM   Final   Special Requests     Final   Value: BOTTLES DRAWN AEROBIC AND ANAEROBIC 10CC BLUE  5CC RED   Culture  Setup Time     Final   Value: 12/09/2013 16:49     Performed at [facility]   Culture     Final   Value: NO GROWTH 5 DAYS     Performed at [facility]   Report Status 12/15/2013 FINAL   Final     Studies:  Recent x-ray studies have been reviewed in detail by the Attending Physician  Scheduled Meds:  Scheduled Meds: . carvedilol  6.25 mg Oral BID WC  . citalopram  40 mg Oral Daily  . diclofenac  75 mg Oral BID   And  . misoprostol  200 mcg Oral BID  . enoxaparin (LOVENOX) injection  100 mg Subcutaneous Q24H  . feeding supplement (PRO-STAT SUGAR FREE  64)  30 mL Oral TID WC  . fluconazole  100 mg Oral Daily  . furosemide  40 mg Oral Daily  . multivitamin with minerals  1 tablet Oral Daily  . nutrition supplement (JUVEN)  1 packet Oral BID BM  . polyethylene glycol  17 g Oral Daily  . senna-docusate  1 tablet Oral BID  . sodium chloride  10-40 mL Intracatheter Q12H    Time spent on care of this patient: 40 mins   Allie Bossier , MD   [facility] Office  [phone] Pager - [phone]  On-Call/Text Page:      [email or web address]      password [email or web address]  If 7PM-7AM, please contact night-coverage [email or web address] Password [email or web address] 12/19/2013, 12:17 PM   LOS: 13 days

## 2013-12-19 NOTE — Progress Notes (Signed)
I met with pt and his Mom at bedside. Also discussed with Dr. Sherral Hammers. We will plan admission to inpt rehab on Saturday once medical workup complete. I will make the arrangements. Pt is in agreement. [phone]

## 2013-12-19 NOTE — Consult Note (Signed)
WOC wound follow up Sponge in the wound bed injected with 10cc Lidocaine 30 minutes prior to dressing removal and the bedside nurse has administered PO pain meds.  Wound type: NPWT VAC dressing; RLQ abdominal wound  Measurement: see 12/17/13 note Wound bed: clean, granulation tissue present, but more pale and non granular in the base Drainage (amount, consistency, odor) slight odor today ,however I believe more related to canister that needed to be changed. After cleansing odor is not present Periwound: new area of denudation from 5-8 o'clock, does not appear to be candida overgrowth like before.  Some contact dermatitis related to use of VAC drape.   Dressing procedure/placement/frequency: Dressing removed and the periwound cleansed thoroughly  and dried well.  Antifungal powder used and no sting skin prep used to crust the periwound skin.  Long strip of black foam used to fill down into the tunneled area and then the remainder of the wound bed. Skin protected with drape for a bridge of black foam away from wound.  Seal at 110mmHG , pt tolerated without problems.  WOC will follow upon transfer to the inpatient rehab setting [place], [place]

## 2013-12-19 NOTE — Progress Notes (Signed)
Patient ID: [name] [name], male   DOB: 09/14/1965, 48 y.o.   MRN: [id number] Stable.tarted on levunox. Repeat mri brain next week

## 2013-12-20 ENCOUNTER — Inpatient Hospital Stay (HOSPITAL_COMMUNITY)
Admission: AD | Admit: 2013-12-20 | Discharge: 2014-01-01 | DRG: 945 | Disposition: A | Discharge status: Home Health | Payer: BC Managed Care – PPO | Source: Intra-hospital | Attending: Physical Medicine & Rehabilitation | Admitting: Physical Medicine & Rehabilitation

## 2013-12-20 DIAGNOSIS — R519 Headache, unspecified: Secondary | ICD-10-CM | POA: Diagnosis present

## 2013-12-20 DIAGNOSIS — Z880 Allergy status to penicillin: Secondary | ICD-10-CM

## 2013-12-20 DIAGNOSIS — R51 Headache: Secondary | ICD-10-CM

## 2013-12-20 DIAGNOSIS — M109 Gout, unspecified: Secondary | ICD-10-CM | POA: Diagnosis present

## 2013-12-20 DIAGNOSIS — E876 Hypokalemia: Secondary | ICD-10-CM

## 2013-12-20 DIAGNOSIS — Z5189 Encounter for other specified aftercare: Principal | ICD-10-CM

## 2013-12-20 DIAGNOSIS — I509 Heart failure, unspecified: Secondary | ICD-10-CM

## 2013-12-20 DIAGNOSIS — I5033 Acute on chronic diastolic (congestive) heart failure: Secondary | ICD-10-CM

## 2013-12-20 DIAGNOSIS — F411 Generalized anxiety disorder: Secondary | ICD-10-CM | POA: Diagnosis present

## 2013-12-20 DIAGNOSIS — E669 Obesity, unspecified: Secondary | ICD-10-CM | POA: Diagnosis present

## 2013-12-20 DIAGNOSIS — S31109A Unspecified open wound of abdominal wall, unspecified quadrant without penetration into peritoneal cavity, initial encounter: Secondary | ICD-10-CM

## 2013-12-20 DIAGNOSIS — D496 Neoplasm of unspecified behavior of brain: Secondary | ICD-10-CM

## 2013-12-20 DIAGNOSIS — Z79899 Other long term (current) drug therapy: Secondary | ICD-10-CM

## 2013-12-20 DIAGNOSIS — R5381 Other malaise: Secondary | ICD-10-CM | POA: Diagnosis present

## 2013-12-20 DIAGNOSIS — D352 Benign neoplasm of pituitary gland: Secondary | ICD-10-CM | POA: Diagnosis present

## 2013-12-20 DIAGNOSIS — F3289 Other specified depressive episodes: Secondary | ICD-10-CM

## 2013-12-20 DIAGNOSIS — Z8349 Family history of other endocrine, nutritional and metabolic diseases: Secondary | ICD-10-CM

## 2013-12-20 DIAGNOSIS — J9610 Chronic respiratory failure, unspecified whether with hypoxia or hypercapnia: Secondary | ICD-10-CM | POA: Diagnosis present

## 2013-12-20 DIAGNOSIS — Z8249 Family history of ischemic heart disease and other diseases of the circulatory system: Secondary | ICD-10-CM

## 2013-12-20 DIAGNOSIS — F329 Major depressive disorder, single episode, unspecified: Secondary | ICD-10-CM

## 2013-12-20 DIAGNOSIS — L02211 Cutaneous abscess of abdominal wall: Secondary | ICD-10-CM | POA: Diagnosis present

## 2013-12-20 DIAGNOSIS — I10 Essential (primary) hypertension: Secondary | ICD-10-CM | POA: Diagnosis present

## 2013-12-20 DIAGNOSIS — I5032 Chronic diastolic (congestive) heart failure: Secondary | ICD-10-CM | POA: Diagnosis present

## 2013-12-20 DIAGNOSIS — I5023 Acute on chronic systolic (congestive) heart failure: Secondary | ICD-10-CM

## 2013-12-20 DIAGNOSIS — H532 Diplopia: Secondary | ICD-10-CM | POA: Diagnosis present

## 2013-12-20 DIAGNOSIS — Z7901 Long term (current) use of anticoagulants: Secondary | ICD-10-CM

## 2013-12-20 DIAGNOSIS — E668 Other obesity: Secondary | ICD-10-CM | POA: Diagnosis present

## 2013-12-20 DIAGNOSIS — Z8614 Personal history of Methicillin resistant Staphylococcus aureus infection: Secondary | ICD-10-CM

## 2013-12-20 DIAGNOSIS — F32A Depression, unspecified: Secondary | ICD-10-CM

## 2013-12-20 DIAGNOSIS — Z7902 Long term (current) use of antithrombotics/antiplatelets: Secondary | ICD-10-CM

## 2013-12-20 DIAGNOSIS — E230 Hypopituitarism: Secondary | ICD-10-CM | POA: Diagnosis present

## 2013-12-20 DIAGNOSIS — G4733 Obstructive sleep apnea (adult) (pediatric): Secondary | ICD-10-CM | POA: Diagnosis present

## 2013-12-20 DIAGNOSIS — M1710 Unilateral primary osteoarthritis, unspecified knee: Secondary | ICD-10-CM | POA: Diagnosis present

## 2013-12-20 LAB — BASIC METABOLIC PANEL
Anion gap: 8 (ref 5–15)
BUN: 23 mg/dL (ref 6–23)
CO2: 33 mEq/L — ABNORMAL HIGH (ref 19–32)
Calcium: 8.4 mg/dL (ref 8.4–10.5)
Chloride: 102 mEq/L (ref 96–112)
Creatinine, Ser: 0.83 mg/dL (ref 0.50–1.35)
GFR calc Af Amer: >90 mL/min (ref >90)
GFR calc non Af Amer: >90 mL/min (ref >90)
Glucose, Bld: 101 mg/dL — ABNORMAL HIGH (ref 70–99)
Potassium: 3.9 mEq/L (ref 3.7–5.3)
Sodium: 143 mEq/L (ref 137–147)

## 2013-12-20 LAB — MAGNESIUM: Magnesium: 2.2 mg/dL (ref 1.5–2.5)

## 2013-12-20 MED ORDER — DICLOFENAC SODIUM 75 MG PO TBEC: 75.0000 mg | DELAYED_RELEASE_TABLET | Freq: Two times a day (BID) | ORAL | Status: DC

## 2013-12-20 MED ORDER — FLUCONAZOLE 100 MG PO TABS: 100.0000 mg | ORAL_TABLET | Freq: Every day | ORAL | Status: DC

## 2013-12-20 MED ORDER — PRO-STAT SUGAR FREE PO LIQD
30.0000 mL | Freq: Three times a day (TID) | ORAL | Status: DC
  Administered 2013-12-20: 17:00:00 via ORAL
  Administered 2013-12-21 – 2014-01-01 (×35): 30 mL via ORAL
  Filled 2013-12-20 (×38): qty 30

## 2013-12-20 MED ORDER — FUROSEMIDE 40 MG PO TABS
40.0000 mg | ORAL_TABLET | Freq: Every day | ORAL | Status: DC
  Administered 2013-12-21 – 2014-01-01 (×12): 40 mg via ORAL
  Filled 2013-12-20 (×13): qty 1

## 2013-12-20 MED ORDER — MAGNESIUM CITRATE PO SOLN: 1.0000 | Freq: Every day | ORAL | Status: DC | PRN

## 2013-12-20 MED ORDER — ALPRAZOLAM 0.25 MG PO TABS: 0.2500 mg | ORAL_TABLET | ORAL | Status: DC | PRN

## 2013-12-20 MED ORDER — LACTULOSE 10 GM/15ML PO SOLN: 30.0000 g | Freq: Two times a day (BID) | ORAL | Status: DC | PRN

## 2013-12-20 MED ORDER — ONDANSETRON HCL 4 MG PO TABS
4.0000 mg | ORAL_TABLET | Freq: Four times a day (QID) | ORAL | Status: DC | PRN
  Administered 2013-12-21: 4 mg via ORAL
  Filled 2013-12-20 (×2): qty 1

## 2013-12-20 MED ORDER — FLEET ENEMA 7-19 GM/118ML RE ENEM: 1.0000 | ENEMA | Freq: Once | RECTAL | Status: AC | PRN

## 2013-12-20 MED ORDER — SODIUM CHLORIDE 0.9 % IJ SOLN
10.0000 mL | INTRAMUSCULAR | Status: DC | PRN
  Administered 2013-12-20 – 2013-12-21 (×8): 10 mL
  Administered 2013-12-22: 30 mL
  Administered 2013-12-22 (×4): 10 mL
  Administered 2013-12-23 – 2013-12-24 (×2): 30 mL
  Administered 2013-12-25: 10 mL
  Administered 2013-12-26 – 2013-12-28 (×3): 30 mL
  Administered 2013-12-29 – 2013-12-30 (×2): 10 mL
  Administered 2013-12-31 (×2): 30 mL

## 2013-12-20 MED ORDER — LACTULOSE 10 GM/15ML PO SOLN
30.0000 g | Freq: Two times a day (BID) | ORAL | Status: DC | PRN
  Filled 2013-12-20: qty 45

## 2013-12-20 MED ORDER — ENOXAPARIN SODIUM 100 MG/ML ~~LOC~~ SOLN
100.0000 mg | SUBCUTANEOUS | Status: DC
  Administered 2013-12-20 – 2013-12-31 (×12): 100 mg via SUBCUTANEOUS
  Filled 2013-12-20 (×13): qty 1

## 2013-12-20 MED ORDER — ALUM & MAG HYDROXIDE-SIMETH 200-200-20 MG/5ML PO SUSP: 30.0000 mL | ORAL | Status: DC | PRN

## 2013-12-20 MED ORDER — ONDANSETRON HCL 4 MG/2ML IJ SOLN: 4.0000 mg | Freq: Four times a day (QID) | INTRAMUSCULAR | Status: DC | PRN

## 2013-12-20 MED ORDER — ADULT MULTIVITAMIN W/MINERALS CH
1.0000 | ORAL_TABLET | Freq: Every day | ORAL | Status: DC
  Administered 2013-12-21 – 2014-01-01 (×12): 1 via ORAL
  Filled 2013-12-20 (×13): qty 1

## 2013-12-20 MED ORDER — TESTOSTERONE CYPIONATE 200 MG/ML IM SOLN: 100.0000 mg | INTRAMUSCULAR | Status: DC

## 2013-12-20 MED ORDER — FUROSEMIDE 40 MG PO TABS: 40.0000 mg | ORAL_TABLET | Freq: Every day | ORAL | Status: DC

## 2013-12-20 MED ORDER — CARVEDILOL 12.5 MG PO TABS
12.5000 mg | ORAL_TABLET | Freq: Two times a day (BID) | ORAL | Status: DC
  Filled 2013-12-20 (×2): qty 1

## 2013-12-20 MED ORDER — MISOPROSTOL 200 MCG PO TABS
200.0000 ug | ORAL_TABLET | Freq: Two times a day (BID) | ORAL | Status: DC
  Administered 2013-12-20 – 2013-12-23 (×6): 200 ug via ORAL
  Filled 2013-12-20 (×8): qty 1

## 2013-12-20 MED ORDER — BISACODYL 10 MG RE SUPP: 10.0000 mg | Freq: Every day | RECTAL | Status: DC | PRN

## 2013-12-20 MED ORDER — JUVEN PO PACK
1.0000 | PACK | Freq: Two times a day (BID) | ORAL | Status: DC
  Administered 2013-12-21 – 2014-01-01 (×23): 1 via ORAL
  Filled 2013-12-20 (×24): qty 1

## 2013-12-20 MED ORDER — ENOXAPARIN SODIUM 100 MG/ML ~~LOC~~ SOLN: 100.0000 mg | SUBCUTANEOUS | Status: DC

## 2013-12-20 MED ORDER — OXYCODONE-ACETAMINOPHEN 5-325 MG PO TABS
1.0000 | ORAL_TABLET | ORAL | Status: DC | PRN
  Administered 2013-12-20 – 2013-12-22 (×7): 1 via ORAL
  Filled 2013-12-20 (×7): qty 1

## 2013-12-20 MED ORDER — DICLOFENAC SODIUM 75 MG PO TBEC
75.0000 mg | DELAYED_RELEASE_TABLET | Freq: Two times a day (BID) | ORAL | Status: DC
  Administered 2013-12-20 – 2013-12-23 (×5): 75 mg via ORAL
  Filled 2013-12-20 (×9): qty 1

## 2013-12-20 MED ORDER — METHOCARBAMOL 500 MG PO TABS
500.0000 mg | ORAL_TABLET | Freq: Four times a day (QID) | ORAL | Status: DC | PRN
  Administered 2013-12-22 – 2014-01-01 (×5): 500 mg via ORAL
  Filled 2013-12-20 (×5): qty 1

## 2013-12-20 MED ORDER — CARVEDILOL 12.5 MG PO TABS: 12.5000 mg | ORAL_TABLET | Freq: Two times a day (BID) | ORAL | Status: DC

## 2013-12-20 MED ORDER — SODIUM CHLORIDE 0.9 % IJ SOLN
10.0000 mL | INTRAMUSCULAR | Status: DC | PRN
  Administered 2013-12-20: 40 mL

## 2013-12-20 MED ORDER — FENTANYL CITRATE 0.05 MG/ML IJ SOLN
20.0000 ug | INTRAMUSCULAR | Status: DC | PRN
  Administered 2013-12-21 – 2013-12-22 (×4): 50 ug via INTRAVENOUS
  Filled 2013-12-20 (×5): qty 2

## 2013-12-20 MED ORDER — LEVOTHYROXINE SODIUM 50 MCG PO TABS
50.0000 ug | ORAL_TABLET | Freq: Every day | ORAL | Status: DC
  Administered 2013-12-21 – 2014-01-01 (×12): 50 ug via ORAL
  Filled 2013-12-20 (×13): qty 1

## 2013-12-20 MED ORDER — MISOPROSTOL 200 MCG PO TABS: 200.0000 ug | ORAL_TABLET | Freq: Two times a day (BID) | ORAL | Status: DC

## 2013-12-20 MED ORDER — POLYETHYLENE GLYCOL 3350 17 G PO PACK
17.0000 g | PACK | Freq: Two times a day (BID) | ORAL | Status: DC
  Administered 2013-12-21 – 2013-12-23 (×4): 17 g via ORAL
  Filled 2013-12-20 (×26): qty 1

## 2013-12-20 MED ORDER — PRO-STAT SUGAR FREE PO LIQD: 30.0000 mL | Freq: Three times a day (TID) | ORAL | Status: DC

## 2013-12-20 MED ORDER — LIDOCAINE HCL 4 % EX SOLN
Freq: Every day | CUTANEOUS | Status: DC | PRN
  Administered 2013-12-22 – 2014-01-01 (×4): via TOPICAL
  Filled 2013-12-20: qty 50

## 2013-12-20 MED ORDER — LIDOCAINE HCL 4 % EX SOLN: Freq: Every day | CUTANEOUS | Status: DC | PRN

## 2013-12-20 MED ORDER — ACETAMINOPHEN 325 MG PO TABS
325.0000 mg | ORAL_TABLET | ORAL | Status: DC | PRN
  Administered 2013-12-21: 650 mg via ORAL
  Filled 2013-12-20: qty 2
  Filled 2013-12-20: qty 1

## 2013-12-20 MED ORDER — ALPRAZOLAM 0.25 MG PO TABS
0.2500 mg | ORAL_TABLET | ORAL | Status: DC | PRN
  Administered 2013-12-20 – 2013-12-22 (×7): 0.25 mg via ORAL
  Filled 2013-12-20 (×7): qty 1

## 2013-12-20 MED ORDER — GUAIFENESIN-DM 100-10 MG/5ML PO SYRP: 5.0000 mL | ORAL_SOLUTION | Freq: Four times a day (QID) | ORAL | Status: DC | PRN

## 2013-12-20 MED ORDER — CITALOPRAM HYDROBROMIDE 40 MG PO TABS
40.0000 mg | ORAL_TABLET | Freq: Every day | ORAL | Status: DC
  Administered 2013-12-21 – 2014-01-01 (×12): 40 mg via ORAL
  Filled 2013-12-20 (×13): qty 1

## 2013-12-20 MED ORDER — POLYETHYLENE GLYCOL 3350 17 G PO PACK: 17.0000 g | PACK | Freq: Every day | ORAL | Status: DC

## 2013-12-20 MED ORDER — FLUCONAZOLE 100 MG PO TABS
100.0000 mg | ORAL_TABLET | Freq: Every day | ORAL | Status: DC
  Administered 2013-12-21 – 2013-12-29 (×9): 100 mg via ORAL
  Filled 2013-12-20 (×10): qty 1

## 2013-12-20 MED ORDER — LEVOTHYROXINE SODIUM 50 MCG PO TABS: 50.0000 ug | ORAL_TABLET | Freq: Every day | ORAL | Status: DC

## 2013-12-20 MED ORDER — TRAZODONE HCL 50 MG PO TABS: 25.0000 mg | ORAL_TABLET | Freq: Every evening | ORAL | Status: DC | PRN

## 2013-12-20 MED ORDER — SENNOSIDES-DOCUSATE SODIUM 8.6-50 MG PO TABS: 1.0000 | ORAL_TABLET | Freq: Two times a day (BID) | ORAL | Status: DC

## 2013-12-20 NOTE — Discharge Summary (Signed)
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 Wynelle Cleveland spoke with Dr Dagmar Hait who recommended repeating TSH, FT4, ACTH and Cortisol and suggested we watch for DI symptoms and hypotension  - appt with Dr Buddy Duty made for 7/ 24@ 8 AM  -In addition have ordered LH/FSH, testosterone panel  -7/17 spoke with Dr. 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 Specimen has been sent to [facility] for second opinion; results still pending  -Followup in one week with Dr. [name] Dr Jovita Gamma, (neurosurgery), to discuss results of pathology report and future plan of care.  Panhypopituitarism  -Currently the labs for Returns issuing the patient secondary to his suprasellar mass and surgery is panhypopituitary  -Start Synthroid 50 mcg daily, rehabilitation/PCP to titrate to effect  -start Testosterone 100 mg IM q 14 days  -Repeat ACTH/cortisol pending, however previous cortisol was within normal limits so will hold off on starting hydrocortisone at this time. Most likely patient will need hydrocortisone in the future.   Ptosis of R Eye  -Continues to have some mild ptosis of the right eye    Peripheral Neuropathy  Well controlled at present   Hyperglycemia  A1c 5.8 therefore not c/w DM - will stop CBG checks   Acute on Chronic Hypercarbic Respiratory Failure  -due to OSA / OHS c/b post-op narcotics  -Has not completed his sleep study.  - Will need to try to auto-titrate at home for now per Pulm notes, rehabilitation to start this process.   Hypertension  -BP trending up, will increase his Coreg to 12.5  BID (home dose was 25 mg   BID) -Would continue to slowly titrate up her Coreg back to his home dose of 25 mg BID if his HR/BP will tolerate -Continue Lasix 40 mg I: Having good results with diuresis  Bradycardia  Resolved   Systolic CHF  -Well compensated at present  - negative 32.7 L  - control BP to avoid exacerbation  -Echocardiogram; show systolic CHF see results below   Hypokalemia  -Resolved continue to monitor -Potassium goal>4   Morbid Obesity - Body mass index is 61.22 kg/(m^2).  -has lost 60 lb since Jan of this yr  -Since patient will be discharged to [facility] will request they ensure patient has followup with surgeons prior to discharge from the facility. In order to facilitate workup for gastric banding/bypass  -Counseled patient that most likely surgeons would want him to heal from this acute episode first.   Hematemesis  -one episode, on NSAID at baseline  - no further bleeding; resumed arthrotec   Mild Thrombocytopenia  Stable/improving   RLQ Chronic Wound VAC / Yeast RLQ  WOC attending to wound care - diflucan as previously prescribed per ID- started 7/4- follow clinically     Discharge Diagnoses:  Principal Problem:   Suprasellar mass Active Problems:   OSA (obstructive sleep apnea)   Neoplasm of brain causing mass effect on adjacent structures   Acute respiratory failure with hypercapnia   Morbid obesity   Abdominal wall abscess   Acute on chronic diastolic heart failure   Extreme obesity   H/O infectious disease   Obstructive apnea   Chronic diastolic heart failure   Infected wound  Panhypopituitarism   Discharge Condition: stable  Diet recommendation: Heart Healthy 1500 cal/day  Filed Weights   12/18/13 0500 12/19/13 0500 12/20/13 0500  Weight: 199 kg (438 lb 11.5 oz) 200 kg (440 lb 14.7 oz) 199 kg (438 lb 11.5 oz)    History of present illness:  48 y/o WM PMHx HTN, Diastolic CHF, OSA who recently moved here from [place] and presented to [facility] ER with 2 day hx of HA,  blurry vision, & 1 episode of dark emesis. CT evaluation noted sellar & suprasellar mass lesion. Developed hypercarbic respiratory failure in ER requiring bipap.  During patient's hospitalization a large anterior mass was resected and has been sent for pathology. 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 Specimen has been sent to [facility] for second opinion;    Procedures: 7/04 - Admit with HA, blurry vision, vomiting (dark emesis, ? blood). CT positive for suprasellar mass  7/5 BIPAP overnight  7/6 significant AMS, improved with BiPAP  7/8 To the OR for sellar mass,Trans-sphenoidal resection of the tumor. remained intubated overnight  7/9 Extubated, no BiPAP due to nasal packing  7/10 nasal packing removed per ENT  7/12 TRH assumes care 7/17 Echocardiogram  Left ventricle:  mild LVH. LVEF= 45%- 50%. Hypokinesis of the inferior myocardium. - Right ventricle: moderately dilated. Wall    Consultations: Dr. Stann Mainland Botero/ Dr Jovita Gamma, (neurosurgery)  Dr.Wesam Kathryne Sharper [facility].)  Dr., Rozetta Nunnery (ENT)   Culture  7/4 MRSA by PCR positive  7/7 blood left hand/forearm negative   Antibiotics Diflucan 7/4 (yeast RLQ abdominal wound)>>  Vancomycin 7/7>>7/8   Devices  ??Wound VAC    LINES / TUBES:  7/6 right triple-lumen PICC    Discharge Exam: Filed Vitals:   12/19/13 2323 12/20/13 0327 12/20/13 0500 12/20/13 0833  BP: 151/78 157/96  154/98  Pulse: 72 75  72  Temp:  98 F (36.7 C)  98.3 F (36.8 C)  TempSrc:  Oral  Axillary  Resp: 14 16  21   Height:      Weight:   199 kg (438 lb 11.5 oz)   SpO2: 91% 97%  97%   General: A./O. x4, NAD, negative headache. No acute respiratory distress, anxious about results of pathology report.  Lungs: Clear to auscultation bilaterally without wheezes or crackles  Cardiovascular: Regular rate and rhythm without murmur gallop or rub normal S1 and S2  Abdomen: Morbidly  obese, Nontender, nondistended, soft, bowel sounds positive, no rebound, no ascites, no appreciable mass, wound VAC in place on RLQ (per patient has been there going on 3 months  Extremities: No significant cyanosis, clubbing. bilateral lower edema of extremities to midthigh   Discharge Instructions     Medication List         albuterol 108 (90 BASE) MCG/ACT inhaler  Commonly known as:  PROAIR HFA  Inhale 1-2 puffs into the lungs every 6 (six) hours as needed for wheezing or shortness of breath.     allopurinol 300 MG tablet  Commonly known as:  ZYLOPRIM  Take 300 mg by mouth daily.     ALPRAZolam 0.25 MG tablet  Commonly known as:  XANAX  Take 0.25 mg by mouth daily as needed for anxiety.     ARTHROTEC 75-0.2 MG Tbec  Generic drug:  Diclofenac-Misoprostol  Take 1 tablet by mouth 2 (two) times daily.     carvedilol 25 MG tablet  Commonly known as:  COREG  Take 25 mg by  mouth 2 (two) times daily with a meal.     citalopram 40 MG tablet  Commonly known as:  CELEXA  Take 40 mg by mouth daily.     cyclobenzaprine 10 MG tablet  Commonly known as:  FLEXERIL  Take 10 mg by mouth daily as needed for muscle spasms.     fluticasone 50 MCG/ACT nasal spray  Commonly known as:  FLONASE  Place 2 sprays into both nostrils daily as needed for allergies or rhinitis.     GRALISE 300 MG Tabs  Generic drug:  Gabapentin (PHN)  Take 900 mg by mouth 3 (three) times daily.     HYDROcodone-acetaminophen 5-325 MG per tablet  Commonly known as:  NORCO/VICODIN  Take 1-2 tablets by mouth every 6 (six) hours as needed for moderate pain.     JUVEN PO  Take 1 packet by mouth 2 (two) times daily. Wound healing     loratadine 10 MG tablet  Commonly known as:  CLARITIN  Take 10 mg by mouth daily as needed for allergies.     multivitamin tablet  Take 1 tablet by mouth daily. Centrum brand     potassium chloride 10 MEQ tablet  Commonly known as:  K-DUR,KLOR-CON  Take 10 mEq by mouth 2 (two)  times daily.     PRENATAL PO  Take 1 tablet by mouth daily. For wound healing     PROBIOTIC DAILY Caps  Take 1 capsule by mouth daily.     pseudoephedrine 120 MG 12 hr tablet  Commonly known as:  SUDAFED  Take 120 mg by mouth daily as needed for congestion.     vitamin C 500 MG tablet  Commonly known as:  ASCORBIC ACID  Take 500 mg by mouth daily.       Allergies  Allergen Reactions  . Penicillins Anaphylaxis and Other (See Comments)    Unknown reaction  . Morphine And Related Hives and Itching      The results of significant diagnostics from this hospitalization (including imaging, microbiology, ancillary and laboratory) are listed below for reference.    Significant Diagnostic Studies: Dg Skull 1-3 Views  12/10/2013   CLINICAL DATA:  Pituitary tumor removal.  EXAM: SKULL - 1-3 VIEW; DG C-ARM 61-120 MIN  COMPARISON:  Brain MRI 12/07/2013  FINDINGS: Fluoroscopic images of the face and skull were obtained. Four intraoperative images were obtained. Surgical instrument extends into the sella turcica from a transsphenoidal approach. There is a small curvilinear density in the sella turcica on the first three images but this is not present on the final image.  IMPRESSION: Trans-sphenoid resection of the pituitary tumor.   Electronically Signed   By: Markus Daft M.D.   On: 12/10/2013 20:07   Ct Head W Wo Contrast  12/06/2013   CLINICAL DATA:  Pituitary tumor. Progressive symptoms. Weakness and dizziness.  EXAM: CT HEAD WITHOUT AND WITH CONTRAST  TECHNIQUE: Contiguous axial images were obtained from the base of the skull through the vertex without and with intravenous contrast  CONTRAST:  80 mL Omnipaque 300  COMPARISON:  None.  FINDINGS: A sellar and suprasellar mass lesion is confirmed. Reformatted images were attempted but of low diagnostic value given the thickness of the acquisition.  The right cavernous sinus is expanded with diffuse enhancement. The suprasellar component extends  superiorly. The right-sided lesion with extension into the cavernous sinus measures 2.1 x 2.7 cm on the axial images. The suprasellar component measures 1.1 x 1.0 cm.  The suprasellar brain  is otherwise unremarkable. No acute infarct or hemorrhage is evident.  Soft tissue fills the sphenoid sinus, presumably related to the same process. The paranasal sinuses and mastoid air cells are otherwise clear.  IMPRESSION: 1. Prominent scratch the large sellar and suprasellar mass lesion with extension into the right cavernous sinus and superior extension beyond the diaphragm sella. There is osseous destruction with extension into the sphenoid sinus. Includes a pituitary macroadenoma. A craniopharyngioma is considered less likely without significant calcifications. A meningioma is also considered. Aneurysm is considered but less likely. Metastatic disease is also considered. Recommend MRI of the brain and pituitary for further evaluation. 2. The suprasellar brain is unremarkable.   Electronically Signed   By: Lawrence Santiago M.D.   On: 12/06/2013 10:31   [name] YC Contrast  12/07/2013   CLINICAL DATA:  pituitary tumor  EXAM: MRI HEAD WITHOUT AND WITH CONTRAST  TECHNIQUE: Multiplanar, multiecho pulse sequences of the brain and surrounding structures were obtained without and with intravenous contrast.  CONTRAST:  69mL MULTIHANCE GADOBENATE DIMEGLUMINE 529 MG/ML IV SOLN  COMPARISON:  Head CT 12/06/2013  FINDINGS: The study is degraded by considerable motion. Patient's body habitus prevents use of the best coil configuration.  Diffusion imaging does not show any acute or subacute infarction. There are mild chronic appearing small vessel changes of the cerebral hemispheric white matter. No cortical insult. No intra-axial mass, hemorrhage, hydrocephalus or extra-axial fluid collection.  There is an invasive mass involving the sella, clivus, sphenoid sinus and suprasellar region. This measures 3.7 cm cephalocaudal, 4.1 cm  front to back, and 3.4 cm right to left. There is invasion of the cavernous sinus on the right, surrounding the carotid artery. Early invasion on the left is possible. The component extending into the suprasellar region measures 14 mm in diameter and indents the undersurface of the optic chiasm. The lesion shows variable enhancement. I do not see cystic change or evidence of calcification by MR.  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 Differential diagnosis does include metastatic disease, chordoma and craniopharyngioma.  IMPRESSION: 3.7 x 4.1 x 3.4 cm mass with the epicenter in the region of the sella/clivus. There is invasion of the bony clivus, invasion of the sphenoid sinus and extension into the suprasellar region. There is definite cavernous sinus invasion on the right and possible or early cavernous sinus invasion on the left. 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 The differential diagnosis does include metastatic disease, chordoma and craniopharyngioma.   Electronically Signed   By: Nelson Chimes M.D.   On: 12/07/2013 16:10   Dg Chest Port 1 View  12/11/2013   CLINICAL DATA:  Status post intubation.  EXAM: PORTABLE CHEST - 1 VIEW  COMPARISON:  Single view of the chest 12/09/2013.  FINDINGS: Endotracheal tube is in place with the tip in good position at the level the clavicular heads. There is cardiomegaly and vascular congestion without frank edema. Subsegmental basilar atelectasis is noted. No pneumothorax.  IMPRESSION: ETT oblique projects in good position.  Cardiomegaly and vascular congestion.   Electronically Signed   By: Inge Rise M.D.   On: 12/11/2013 07:41   Dg Chest Port 1 View  12/09/2013   CLINICAL DATA:  Shortness of Breath  EXAM: PORTABLE CHEST - 1 VIEW  COMPARISON:  November 08, 2013  FINDINGS: Central catheter tip is at the cavoatrial junction. No pneumothorax. There is mild right base atelectatic change. Lungs are otherwise clear. Heart  is mildly enlarged but  stable. The pulmonary vascularity is within normal limits. No adenopathy.  IMPRESSION: No frank edema or consolidation. Mild right base atelectasis. Stable cardiac enlargement. No pneumothorax.   Electronically Signed   By: Lowella Grip M.D.   On: 12/09/2013 07:32   Dg Chest Port 1 View  12/08/2013   CLINICAL DATA:  PICC line placement.  EXAM: PORTABLE CHEST - 1 VIEW  COMPARISON:  12/07/2013  FINDINGS: Interval placement of a right PICC catheter with tip over the low SVC region. No pneumothorax. Shallow inspiration with elevation of the right hemidiaphragm. Atelectasis in the lung bases. Cardiac enlargement without significant vascular congestion.  IMPRESSION: Right PICC catheter tip overlies the lower SVC.  No pneumothorax.   Electronically Signed   By: Lucienne Capers M.D.   On: 12/08/2013 21:39   Dg Chest Port 1 View  12/07/2013   CLINICAL DATA:  Assess airspace disease  EXAM: PORTABLE CHEST - 1 VIEW  COMPARISON:  Portable chest x-ray of December 06, 2013  FINDINGS: The patient remains rotated. The lungs are reasonably well inflated. The interstitial markings are less conspicuous today. The pulmonary vascularity remains engorged. The cardiopericardial silhouette remains enlarged. No definite pleural effusion is demonstrated. There is persistent elevation of the right hemidiaphragm.  IMPRESSION: There has been slight interval improvement in the appearance of the lungs consistent with resolving interstitial edema.   Electronically Signed   By: David  Martinique   On: 12/07/2013 07:24   Dg Chest Port 1 View  12/06/2013   CLINICAL DATA:  Severe shortness of breath.  EXAM: PORTABLE CHEST - 1 VIEW  COMPARISON:  10/06/2013.  FINDINGS: Significant patient rotation to the right. No gross change in enlargement of the cardiac silhouette. The lungs are grossly clear. The pulmonary vasculature and interstitial markings remain prominent. The visualized bones are unremarkable.  IMPRESSION: Stable  cardiomegaly, pulmonary vascular congestion and mild chronic interstitial lung disease.   Electronically Signed   By: Enrique Sack M.D.   On: 12/06/2013 11:32   Dg C-arm 1-60 Min  12/10/2013   CLINICAL DATA:  Pituitary tumor removal.  EXAM: SKULL - 1-3 VIEW; DG C-ARM 61-120 MIN  COMPARISON:  Brain MRI 12/07/2013  FINDINGS: Fluoroscopic images of the face and skull were obtained. Four intraoperative images were obtained. Surgical instrument extends into the sella turcica from a transsphenoidal approach. There is a small curvilinear density in the sella turcica on the first three images but this is not present on the final image.  IMPRESSION: Trans-sphenoid resection of the pituitary tumor.   Electronically Signed   By: Markus Daft M.D.   On: 12/10/2013 20:07    Microbiology: No results found for this or any previous visit (from the past 240 hour(s)).   Labs: Basic Metabolic Panel:  Recent Labs Lab 12/15/13 0734 12/16/13 0530 12/18/13 1200 12/19/13 0530 12/20/13 0500  NA 143 142 141 142 143  K 3.8 3.8 4.0 3.8 3.9  CL 99 97 96 100 102  CO2 34* 36* 31 33* 33*  GLUCOSE 104* 97 107* 108* 101*  BUN 18 23 27* 31* 23  CREATININE 0.81 0.80 0.94 0.96 0.83  CALCIUM 8.7 8.6 9.0 8.6 8.4  MG  --   --  2.2 2.2 2.2   Liver Function Tests: No results found for this basename: AST, ALT, ALKPHOS, BILITOT, PROT, ALBUMIN,  in the last 168 hours No results found for this basename: LIPASE, AMYLASE,  in the last 168 hours No results found for this basename: AMMONIA,  in the  last 168 hours CBC:  Recent Labs Lab 12/15/13 0832 12/16/13 0530  WBC 6.5 6.7  HGB 12.9* 12.2*  HCT 42.8 40.4  MCV 89.2 90.0  PLT 128* 128*   Cardiac Enzymes: No results found for this basename: CKTOTAL, CKMB, CKMBINDEX, TROPONINI,  in the last 168 hours BNP: BNP (last 3 results) No results found for this basename: PROBNP,  in the last 8760 hours CBG:  Recent Labs Lab 12/16/13 1125  GLUCAP 92        Signed:  Dia Crawford, MD [facility] [phone] pager

## 2013-12-20 NOTE — Progress Notes (Signed)
Pt arrived to 4W12 accompanied by mother. Reviewed rehab routines, safety precautions, answered all questions. Pt and mother requesting change of primary rehab MD from [place] to [place], left a note in physician office for this to be addressed on Monday. Hortencia Conradi RN

## 2013-12-20 NOTE — Progress Notes (Signed)
[name] [name] is a 48 y.o. male   New admission to PMR Subjective: C/o anxiety. No new problems. Slept well. Feeling OK.  Objective: Vital signs in last 24 hours: Temp:  [97.6 F (36.4 C)-98.3 F (36.8 C)] 98.3 F (36.8 C) (07/18 0833) Pulse Rate:  [72-90] 72 (07/18 0833) Resp:  [14-21] 21 (07/18 0833) BP: (128-157)/(72-98) 154/98 mmHg (07/18 0833) SpO2:  [91 %-98 %] 97 % (07/18 0833) Weight:  [438 lb 11.5 oz (199 kg)] 438 lb 11.5 oz (199 kg) (07/18 0500) Weight change: -2 lb 3.3 oz (-1 kg) Last BM Date: 12/18/13  Intake/Output from previous day: 07/17 0701 - 07/18 0700 In: 958 [P.O.:958] Out: 2451 [Urine:2450; Stool:1] Last cbgs: CBG (last 3)  No results found for this basename: GLUCAP,  in the last 72 hours   Physical Exam General: No apparent distress  Morbidly obese HEENT: not dry Lungs: Normal effort. Lungs clear to auscultation, no crackles or wheezes. Cardiovascular: Regular rate and rhythm, no edema Abdomen: S/NT/ND; BS(+) Musculoskeletal:  unchanged Neurological: No new neurological deficits Wounds: clean   Skin: clear; face flushed Mental state: Alert, oriented, cooperative. Anxious    Lab Results: BMET    Component Value Date/Time   NA 143 12/20/2013 0500   K 3.9 12/20/2013 0500   CL 102 12/20/2013 0500   CO2 33* 12/20/2013 0500   GLUCOSE 101* 12/20/2013 0500   BUN 23 12/20/2013 0500   CREATININE 0.83 12/20/2013 0500   CALCIUM 8.4 12/20/2013 0500   GFRNONAA >90 12/20/2013 0500   GFRAA >90 12/20/2013 0500   CBC    Component Value Date/Time   WBC 6.7 12/16/2013 0530   RBC 4.49 12/16/2013 0530   HGB 12.2* 12/16/2013 0530   HCT 40.4 12/16/2013 0530   PLT 128* 12/16/2013 0530   MCV 90.0 12/16/2013 0530   MCH 27.2 12/16/2013 0530   MCHC 30.2 12/16/2013 0530   RDW 18.3* 12/16/2013 0530   LYMPHSABS 0.6* 12/08/2013 0310   MONOABS 0.7 12/08/2013 0310   EOSABS 0.0 12/08/2013 0310   BASOSABS 0.0 12/08/2013 0310    Studies/Results: No results  found.  Medications: I have reviewed the patient's current medications.  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 Specimen has been sent to [facility] for second opinion  Panhypopituitarism  -Currently the labs for Returns issuing the patient secondary to his suprasellar mass and surgery is panhypopituitary  -Start Synthroid 50 mcg daily, rehabilitation/PCP to titrate to effect  -start Testosterone 100 mg IM q 14 days  -Repeat ACTH/cortisol pending, however previous cortisol was within normal limits so will hold off on starting hydrocortisone at this time. Most likely patient will need hydrocortisone in the future.  Ptosis of R Eye  Slowly improving  Peripheral Neuropathy  Well controlled at present  Hyperglycemia  A1c 5.8 therefore not c/w DM - will stop CBG checks  Acute on Chronic Hypercarbic Respiratory Failure  -due to OSA / OHS c/b post-op narcotics  -Has not completed his sleep study.  - Will need to try to auto-titrate at home for now per Pulm notes, rehabilitation to start this process.  Hypertension  -BP slightly outside ADA/AHA guideline, but given that patient has been running low over the last several days we'll hold off on starting BP medication now and trend.  Bradycardia  Resolved  Diastolic CHF  -Well compensated at present  - negative 31 L  - control BP to avoid exacerbation  -Echocardiogram, results pending  Hypokalemia  -Resolved continue to monitor  Morbid Obesity - Body mass index is 61.22 kg/(m^2).  -has lost 60 lb since Jan of this yr  -Since patient will be  discharged to [facility] will request they ensure patient has followup with surgeons prior to discharge from the facility. In order to facilitate workup for gastric banding/bypass  -Counseled patient that most likely surgeons would want him to heal from this acute episode first.  Hematemesis  -one episode, on NSAID at baseline  - no further bleeding; resumed arthrotec  Mild Thrombocytopenia  Stable/improving   A complex case     Length of stay, days: [place] , MD 12/20/2013, 10:02 AM

## 2013-12-20 NOTE — Progress Notes (Signed)
Sr. Burnice Logan notified of patient's request for Fentanyl IV that he was taking on his former unit and was promised by his MD that he would be getting on this unit; patient has brain tumor and arthritis (per patient report).  Order received:  Fentanyl 25-50 mcg IV q 2 hrs prn severe pain until AM MD rounds only.

## 2013-12-20 NOTE — Progress Notes (Signed)
PT Cancellation Note  Patient Details Name: Derron Pipkins MRN: [id number] DOB: [birth year]   Cancelled Treatment:    Reason Eval/Treat Not Completed: Patient declined, no reason specified.  Patient reports he is preparing to transfer to [facility].  Declined PT at this time.   Despina Pole 12/20/2013, 12:48 PM Carita Pian. Sanjuana Kava, [place] Pager [phone]

## 2013-12-20 NOTE — Progress Notes (Signed)
Called report to [facility] on 4W who will be taking over care of the pt.

## 2013-12-21 ENCOUNTER — Inpatient Hospital Stay (HOSPITAL_COMMUNITY): Payer: BC Managed Care – PPO | Admitting: Physical Therapy

## 2013-12-21 ENCOUNTER — Inpatient Hospital Stay (HOSPITAL_COMMUNITY): Payer: BC Managed Care – PPO | Admitting: *Deleted

## 2013-12-21 DIAGNOSIS — F329 Major depressive disorder, single episode, unspecified: Secondary | ICD-10-CM

## 2013-12-21 DIAGNOSIS — G4733 Obstructive sleep apnea (adult) (pediatric): Secondary | ICD-10-CM

## 2013-12-21 DIAGNOSIS — D496 Neoplasm of unspecified behavior of brain: Secondary | ICD-10-CM

## 2013-12-21 DIAGNOSIS — R5381 Other malaise: Secondary | ICD-10-CM

## 2013-12-21 DIAGNOSIS — I5033 Acute on chronic diastolic (congestive) heart failure: Secondary | ICD-10-CM

## 2013-12-21 DIAGNOSIS — F3289 Other specified depressive episodes: Secondary | ICD-10-CM

## 2013-12-21 LAB — BASIC METABOLIC PANEL
Anion gap: 10 (ref 5–15)
BUN: 21 mg/dL (ref 6–23)
CO2: 32 mEq/L (ref 19–32)
Calcium: 8.6 mg/dL (ref 8.4–10.5)
Chloride: 102 mEq/L (ref 96–112)
Creatinine, Ser: 0.81 mg/dL (ref 0.50–1.35)
GFR calc Af Amer: >90 mL/min (ref >90)
GFR calc non Af Amer: >90 mL/min (ref >90)
Glucose, Bld: 95 mg/dL (ref 70–99)
Potassium: 3.6 mEq/L — ABNORMAL LOW (ref 3.7–5.3)
Sodium: 144 mEq/L (ref 137–147)

## 2013-12-21 LAB — MAGNESIUM: Magnesium: 2 mg/dL (ref 1.5–2.5)

## 2013-12-21 NOTE — Progress Notes (Signed)
[name] [name] is a 48 y.o. male   New admission to PMR Subjective: C/o anxiety re: possible cancer. C/o a HA. Slept well. Feeling OK otherwise.  Objective: Vital signs in last 24 hours: Temp:  [97.6 F (36.4 C)-98.6 F (37 C)] 97.9 F (36.6 C) (07/19 0554) Pulse Rate:  [72-92] 79 (07/19 0554) Resp:  [16-21] 19 (07/19 0554) BP: (136-154)/(84-103) 152/94 mmHg (07/19 0649) SpO2:  [92 %-99 %] 99 % (07/19 0554) Weight change:  Last BM Date: 12/20/13  Intake/Output from previous day: 07/18 0701 - 07/19 0700 In: 240 [P.O.:240] Out: 1500 [Urine:1500] Last cbgs: CBG (last 3)  No results found for this basename: GLUCAP,  in the last 72 hours   Physical Exam General: No apparent distress.  Morbidly obese HEENT: not dry Lungs: Normal effort. Lungs clear to auscultation, no crackles or wheezes. Cardiovascular: Regular rate and rhythm, no edema Abdomen: S/NT/ND; BS(+) Musculoskeletal:  unchanged Neurological: No new neurological deficits Wounds: clean   Skin: clear; face flushed Mental state: Alert, oriented, cooperative. Anxious    Lab Results: BMET    Component Value Date/Time   NA 144 12/21/2013 0517   K 3.6* 12/21/2013 0517   CL 102 12/21/2013 0517   CO2 32 12/21/2013 0517   GLUCOSE 95 12/21/2013 0517   BUN 21 12/21/2013 0517   CREATININE 0.81 12/21/2013 0517   CALCIUM 8.6 12/21/2013 0517   GFRNONAA >90 12/21/2013 0517   GFRAA >90 12/21/2013 0517   CBC    Component Value Date/Time   WBC 6.7 12/16/2013 0530   RBC 4.49 12/16/2013 0530   HGB 12.2* 12/16/2013 0530   HCT 40.4 12/16/2013 0530   PLT 128* 12/16/2013 0530   MCV 90.0 12/16/2013 0530   MCH 27.2 12/16/2013 0530   MCHC 30.2 12/16/2013 0530   RDW 18.3* 12/16/2013 0530   LYMPHSABS 0.6* 12/08/2013 0310   MONOABS 0.7 12/08/2013 0310   EOSABS 0.0 12/08/2013 0310   BASOSABS 0.0 12/08/2013 0310    Studies/Results: No results found.  Medications: I have reviewed the patient's current  medications.  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 Specimen has been sent to [facility] for second opinion  Panhypopituitarism  -Currently the labs for Returns issuing the patient secondary to his suprasellar mass and surgery is panhypopituitary  -Start Synthroid 50 mcg daily, rehabilitation/PCP to titrate to effect  -start Testosterone 100 mg IM q 14 days  -Repeat ACTH/cortisol pending, however previous cortisol was within normal limits so will hold off on starting hydrocortisone at this time. Most likely patient will need hydrocortisone in the future.  Ptosis of R Eye  Slowly improving  Peripheral Neuropathy  Well controlled at present  Hyperglycemia  A1c 5.8 therefore not c/w DM - will stop CBG checks  Acute on Chronic Hypercarbic Respiratory Failure  -due to OSA / OHS c/b post-op narcotics  -Has not completed his sleep study.  - Will need to try to auto-titrate at home for now per Pulm notes, rehabilitation to start this process.  Hypertension  -BP slightly outside ADA/AHA guideline, but given that patient has been running low over the  last several days we'll hold off on starting BP medication now and trend.  Bradycardia  Resolved  Diastolic CHF  -Well compensated at present  - negative 31 L  - control BP to avoid exacerbation  -Echocardiogram, results pending  Hypokalemia  -Resolved continue to monitor  Morbid Obesity - Body mass index is 61.22 kg/(m^2).  -has lost 60 lb since Jan of this yr  -Since patient will be discharged to [facility] will request they ensure patient has  followup with surgeons prior to discharge from the facility. In order to facilitate workup for gastric banding/bypass  -Counseled patient that most likely surgeons would want him to heal from this acute episode first.  Hematemesis  -one episode, on NSAID at baseline  - no further bleeding; resumed arthrotec  Mild Thrombocytopenia  Stable/improving   HA. He relates it to his CPAP (started to use it recently) -he asked for Fentanyl IV for breakthrough pain - may use for another day  OSA -on CPAP  Anxiety -see Rx  A complex case     Length of stay, days: 1  Walker Kehr , MD 12/21/2013, 8:27 AM

## 2013-12-21 NOTE — Evaluation (Signed)
Physical Therapy Assessment and Plan  Patient Details  Name: [name] [name] MRN: [id number] Date of Birth: 11-05-1965  PT Diagnosis: Difficulty walking and Muscle weakness Rehab Potential: Fair ELOS: 7 to 10 days   Today's Date: 12/21/2013 Time: 0800-0855 Time Calculation (min): 55 min  Problem List:  Patient Active Problem List   Diagnosis Date Noted  . Physical deconditioning 12/20/2013  . 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 Gout 12/15/2013  . Clinical depression 12/15/2013  . Acute respiratory failure with hypercapnia 12/07/2013  . Morbid obesity 12/07/2013  . Neoplasm of brain causing mass effect on adjacent structures 12/06/2013  . 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 Wheezing 10/06/2013  . OSA (obstructive sleep apnea) 10/06/2013  . Injury of kidney 08/07/2013  . Acute on chronic diastolic heart failure 40/98/1191  . Obstructive apnea 08/07/2013  . Disorder of peripheral nervous system 08/07/2013  . 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 Extreme obesity 08/02/2013  . Chronic diastolic heart failure 47/82/9562  . Infected wound 07/28/2013  . 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 Elevated blood uric acid level 04/23/2013  . Abdominal wall abscess 12/27/2012  . Calculus of kidney 11/22/2012  . Kidney cysts 11/22/2012  . BP (high blood pressure) 05/21/2012  . H/O infectious disease 12/04/2011    Past Medical History:  Past Medical History  Diagnosis Date  . Sleep apnea   . Allergic rhinitis   . Diastolic CHF   . Gout   . Nephrolithiasis   . Peripheral neuropathy   . Hypertension    Past Surgical History:  Past Surgical History  Procedure Laterality Date  . Sigmoid colonoscopy    . Hernia repair    . Abdominal surgery      for cyst with MRSA  . Craniotomy N/A 12/10/2013    Procedure: Transpheniodal resection of Pituitary tumor with Dr. Radene Journey for approach;  Surgeon: Floyce Stakes, MD;  Location: [facility] NEURO ORS;  Service:  Neurosurgery;  Laterality: N/A;  Transpheniodal resection of Pituitary tumor with Dr. Radene Journey for approach  . Transnasal approach N/A 12/10/2013    Procedure: TRANSNASAL APPROACH;  Surgeon: Rozetta Nunnery, MD;  Location: [facility] NEURO ORS;  Service: ENT;  Laterality: N/A;    Assessment & Plan Clinical Impression: Patient is a 48 y.o. male with history of HTN, OSA, diastolic CHF, morbid obesity, chronic abdominal wound with recent abdominal wall abscess s/p I and D 06/06/13 (with prolonged hospitalization and CIR stay 08/2013 in [place]) who was admitted on 12/06/13 with 2 day history of HA, blurry vision with right ptosis, hematemesis, and hypercarbic respiratory failure. He was transferred from [facility] to [name] for management as CT head revealed prominent, large sellar & suprasellar mass lesion with extension into the R cavernous sinus, ssuperior extension beyond the diaphragm sella. Osseous destruction with extension into sphenoid sinus. He was placed on BIPAP and clear liquid diet. Dr. Doy Mince consulted for input and labs done revealing ACTH 135, FH <0.1, FSH < 0.3, TSH 0.137, Free T4- 0.81 and Cortisol level 31.2.  Dr. Joya Salm and [facility] ENT consulted for input on surgical intervention. Dr. Tommy Medal and Dr. Georgette Dover consulted for input on chronic abdominal wound and recommended continuing fluconazole and no need for antibiotics as no signs of infection noted. To continue with VAC and cleared to proceed with surgery per [facility] input. He has had issues with anxiety requiring BIPAP for labored breathing. On 12/10/13, he underwent transphenoidal resection of pituitary tumor by Dr Radene Journey and Dr. Joya Salm. He  was extubated on 07/09 and cleared for regular diet. 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 Nasal splints removed with improvement in breathing. Repeat cortisol level down to 12.4. Case discussed with Dr. Buddy Duty who  recommends monitoring for DI and to follow up in the office past discharge. 2D echo done 07/17 revealing EF 45-55% with hypokinesis of inferior myocardium and mild MVR with mild LVH. Patient continues to be limited by diplopia, generalized weakness, pain as well as body habitus.   Patient transferred to CIR on 12/20/2013 .   Patient currently requires min with mobility secondary to muscle weakness and decreased cardiorespiratoy endurance.  Prior to hospitalization, patient was modified independent  with mobility and lived with Other (Comment) (lives with parents) in a House home.  Home access is ramp entranceRamped entrance.  Patient will benefit from skilled PT intervention to maximize safe functional mobility, minimize fall risk and decrease caregiver burden for planned discharge home with 24 hour assist.  Anticipate patient will benefit from follow up [facility] at discharge.  PT - End of Session Activity Tolerance: Tolerates 30+ min activity with multiple rests Endurance Deficit: Yes PT Assessment Rehab Potential: Fair Barriers to Discharge: Decreased caregiver support PT Patient demonstrates impairments in the following area(s): Balance;Endurance;Motor PT Transfers Functional Problem(s): Bed Mobility;Bed to Chair;Car PT Locomotion Functional Problem(s): Ambulation;Wheelchair Mobility PT Plan PT Intensity: Minimum of 1-2 x/day ,45 to 90 minutes PT Frequency: 5 out of 7 days PT Duration Estimated Length of Stay: 7 to 10 days PT Treatment/Interventions: Ambulation/gait training;Balance/vestibular training;Discharge planning;DME/adaptive equipment instruction;Functional mobility training;Patient/family education;Neuromuscular re-education;Therapeutic Activities;Stair training;Therapeutic Exercise;UE/LE Coordination activities;UE/LE Strength taining/ROM;Wheelchair propulsion/positioning PT Transfers Anticipated Outcome(s): mod I for bed mobility and transfers PT Locomotion Anticipated Outcome(s): mod I  for ambulation and w/c mobility PT Recommendation Follow Up Recommendations: Home health PT Patient destination: Home Equipment Recommended: To be determined  Skilled Therapeutic Intervention PT evaluation completed and treatment plan initiated. Pt tolerated edge of bed about 5 mins with S. Pt transferred edge of bed to stretcher chair with min A and rolling walker. Discussed with nsg to order pt a bariatric w/c.   PT Evaluation Precautions/Restrictions Precautions Precautions: Fall Precaution Comments: wound vac Restrictions Weight Bearing Restrictions: No General Chart Reviewed: Yes Family/Caregiver Present: No Vital Signs  Pain Pt c/o headache and B LEs pain 8 to 9/10.   Home Living/Prior Functioning Home Living Available Help at Discharge: Family;Available 24 hours/day Type of Home: House Home Access: Ramped entrance Entrance Stairs-Number of Steps: ramp entrance Entrance Stairs-Rails: Right Home Layout: One level Additional Comments:  (Is trying to get a power wc)  Lives With: Other (Comment) (lives with parents) Prior Function Level of Independence: Requires assistive device for independence;Independent with transfers;Independent with gait  Able to Take Stairs?: No Driving: No Comments: pt's mother is primary care taker Vision/Perception  Vision - History Baseline Vision: Bifocals Visual History:  (piturity tumor) Patient Visual Report: Diplopia;Eye fatigue/eye pain/headache Vision - Assessment Eye Alignment: Impaired (comment) ((R)eye deviated laterally, but able to move in all quadrants) Vision Assessment: Vision impaired - to be further tested in functional context  Convergence: Impaired (comment) Diplopia Assessment: Disappears with one eye closed;Objects split side to side  Cognition Overall Cognitive Status: Within Functional Limits for tasks assessed Arousal/Alertness: Awake/alert Orientation Level: Oriented X4 Awareness: Appears  intact Safety/Judgment: Appears intact Sensation Sensation Light Touch: Appears Intact Additional Comments: impaired B LEs to light touch Coordination Gross Motor Movements are  Fluid and Coordinated: Yes (both shoulders have decribiutus) Fine Motor Movements are Fluid and Coordinated: Yes Motor  Motor Motor: Within Functional Limits  Mobility Bed Mobility Bed Mobility: Supine to Sit Supine to Sit: 4: Min assist;HOB elevated;With rails Transfers Transfers: Yes Sit to Stand: 4: Min assist Sit to Stand Details: Verbal cues for technique Stand Pivot Transfers: 4: Min assist Stand Pivot Transfer Details (indicate cue type and reason): +2 for safety, min A transfer Locomotion  Ambulation Ambulation: Yes Ambulation/Gait Assistance: 4: Min assist Ambulation Distance (Feet): 4 Feet Assistive device: Rolling walker Stairs / Additional Locomotion Stairs: No Wheelchair Mobility Wheelchair Mobility: No  Trunk/Postural Assessment  Cervical Assessment Cervical Assessment: Within Functional Limits Thoracic Assessment Thoracic Assessment: Within Functional Limits Lumbar Assessment Lumbar Assessment: Within Functional Limits Postural Control Postural Control: Deficits on evaluation Postural Limitations: increased leaning to right due to wound vac  Balance Balance Balance Assessed: Yes Static Sitting Balance Static Sitting - Balance Support: Feet supported Static Sitting - Level of Assistance: 5: Stand by assistance Static Standing Balance Static Standing - Balance Support: During functional activity;Bilateral upper extremity supported Static Standing - Level of Assistance: 4: Min assist Dynamic Standing Balance Dynamic Standing - Balance Support: Bilateral upper extremity supported;During functional activity Dynamic Standing - Level of Assistance: 4: Min assist Extremity Assessment  B UEs as per OT evaluation.    RLE Assessment RLE Assessment: Exceptions to [facility] RLE AROM  (degrees) Overall AROM Right Lower Extremity: Within functional limits for tasks assessed RLE Strength RLE Overall Strength: Deficits RLE Overall Strength Comments: grossly 3+/5 LLE Assessment LLE Assessment: Exceptions to WFL LLE AROM (degrees) Overall AROM Left Lower Extremity: Within functional limits for tasks assessed LLE Strength LLE Overall Strength: Deficits LLE Overall Strength Comments: grossly 3+/5  FIM:  FIM - Bed/Chair Transfer Bed/Chair Transfer Assistive Devices: Arm rests;HOB elevated;Bed rails;Walker Bed/Chair Transfer: 4: Supine > Sit: Min A (steadying Pt. > 75%/lift 1 leg);4: Bed > Chair or W/C: Min A (steadying Pt. > 75%) FIM - Locomotion: Wheelchair Locomotion: Wheelchair: 0: Activity did not occur FIM - Locomotion: Ambulation Locomotion: Ambulation Assistive Devices: Administrator Ambulation/Gait Assistance: 4: Min assist Locomotion: Ambulation: 1: Travels less than 50 ft with minimal assistance (Pt.>75%) FIM - Locomotion: Stairs Locomotion: Stairs: 0: Activity did not occur   Refer to Care Plan for Long Term Goals  Recommendations for other services: None  Discharge Criteria: Patient will be discharged from PT if patient refuses treatment 3 consecutive times without medical reason, if treatment goals not met, if there is a change in medical status, if patient makes no progress towards goals or if patient is discharged from hospital.  The above assessment, treatment plan, treatment alternatives and goals were discussed and mutually agreed upon: by patient  Dub Amis 12/21/2013, 12:25 PM

## 2013-12-21 NOTE — Evaluation (Signed)
Occupational Therapy Assessment and Plan  Patient Details  Name: [name] [name] MRN: [id number] Date of Birth: 06-08-1965  OT Diagnosis: acute pain, disturbance of vision, muscle weakness (generalized) and pain in joint Rehab Potential: Rehab Potential: Good ELOS: 7-9 days   Today's Date: 12/21/2013 Time: -1030-1130  (60 min) Time Calculation (min): 60 min  Problem List:  Patient Active Problem List   Diagnosis Date Noted  . Physical deconditioning 12/20/2013  . 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 Gout 12/15/2013  . Clinical depression 12/15/2013  . Acute respiratory failure with hypercapnia 12/07/2013  . Morbid obesity 12/07/2013  . Neoplasm of brain causing mass effect on adjacent structures 12/06/2013  . 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 Wheezing 10/06/2013  . OSA (obstructive sleep apnea) 10/06/2013  . Injury of kidney 08/07/2013  . Acute on chronic diastolic heart failure 20/25/4270  . Obstructive apnea 08/07/2013  . Disorder of peripheral nervous system 08/07/2013  . 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 Extreme obesity 08/02/2013  . Chronic diastolic heart failure 62/37/6283  . Infected wound 07/28/2013  . 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 Elevated blood uric acid level 04/23/2013  . Abdominal wall abscess 12/27/2012  . Calculus of kidney 11/22/2012  . Kidney cysts 11/22/2012  . BP (high blood pressure) 05/21/2012  . H/O infectious disease 12/04/2011    Past Medical History:  Past Medical History  Diagnosis Date  . Sleep apnea   . Allergic rhinitis   . Diastolic CHF   . Gout   . Nephrolithiasis   . Peripheral neuropathy   . Hypertension    Past Surgical History:  Past Surgical History  Procedure Laterality Date  . Sigmoid colonoscopy    . Hernia repair    . Abdominal surgery      for cyst with MRSA  . Craniotomy N/A 12/10/2013    Procedure: Transpheniodal resection of Pituitary tumor with Dr. Radene Journey for approach;   Surgeon: Floyce Stakes, MD;  Location: [facility] NEURO ORS;  Service: Neurosurgery;  Laterality: N/A;  Transpheniodal resection of Pituitary tumor with Dr. Radene Journey for approach  . Transnasal approach N/A 12/10/2013    Procedure: TRANSNASAL APPROACH;  Surgeon: Rozetta Nunnery, MD;  Location: [facility] NEURO ORS;  Service: ENT;  Laterality: N/A;    Assessment & Plan Clinical Impression: Clinical Impression: PatientCorbrett [name] is a 48 y.o. male with history of HTN, OSA, diastolic CHF, morbid obesity, chronic abdominal wound with recent abdominal wall abscess s/p I and D 06/06/13 (with prolonged hospitalization and CIR stay 08/2013 in [place]) who was admitted on 12/06/13 with 2 day history of HA, blurry vision with right ptosis, hematemesis, and hypercarbic respiratory failure. He was transferred from [facility] to [facility] for management as CT head revealed prominent, large sellar & suprasellar mass lesion with extension into the R cavernous sinus, ssuperior extension beyond the diaphragm sella. Osseous destruction with extension into sphenoid sinus. He was placed on BIPAP and clear liquid diet. Dr. Doy Mince consulted for input and labs done revealing ACTH 135, FH <0.1, FSH < 0.3, TSH 0.137, Free T4- 0.81 and Cortisol level 31.2.  Dr. Joya Salm and [facility]NT consulted for input on surgical intervention. Dr. Tommy Medal and Dr. Georgette Dover consulted for input on chronic abdominal wound and recommended continuing fluconazole and no need for antibiotics as no signs of infection noted. To continue with VAC and cleared to proceed with surgery per CCS input. He has had issues with anxiety requiring BIPAP for labored breathing. On 12/10/13, he underwent  transphenoidal resection of pituitary tumor by Dr Radene Journey and Dr. Joya Salm. He was extubated on 07/09 and cleared for regular diet. 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 Nasal splints removed  with improvement in breathing. Repeat cortisol level down to 12.4. Case discussed with Dr. Buddy Duty who recommends monitoring for DI and to follow up in the office past discharge. 2D echo done 07/17 revealing EF 45-55% with hypokinesis of inferior myocardium and mild MVR with mild LVH. Patient continues to be limited by diplopia, generalized weakness, pain as well as body habitus. Therapy initiated and CIR recommended by rehab team.    Patient transferred to CIR on 12/20/2013 .    Patient currently requires mod with basic self-care skills secondary to muscle weakness and muscle joint tightness and decreased visual acuity and decreased visual motor skills.  Prior to hospitalization, patient could complete *BADL with independent .  Patient will benefit from skilled intervention to decrease level of assist with basic self-care skills and increase independence with basic self-care skills prior to discharge home with care partner.  Anticipate patient will require intermittent supervision and follow up home health.  OT - End of Session Endurance Deficit: Yes OT Assessment Rehab Potential: Good Barriers to Discharge:  (none) OT Patient demonstrates impairments in the following area(s): Balance;Endurance;Motor;Pain;Safety;Vision OT Basic ADL's Functional Problem(s): Grooming;Bathing;Dressing;Toileting;Other (comment) (vision) OT Transfers Functional Problem(s): Toilet;Tub/Shower OT Additional Impairment(s): None OT Plan OT Intensity: Minimum of 1-2 x/day, 45 to 90 minutes OT Frequency: 5 out of 7 days OT Duration/Estimated Length of Stay: 7-9 days OT Treatment/Interventions: Balance/vestibular training;Discharge planning;DME/adaptive equipment instruction;Functional mobility training;Pain management;Patient/family education;Self Care/advanced ADL retraining;Therapeutic Activities;Therapeutic Exercise;UE/LE Strength taining/ROM;Visual/perceptual remediation/compensation;Wheelchair propulsion/positioning OT  Self Feeding Anticipated Outcome(s): indpendent OT Basic Self-Care Anticipated Outcome(s): mod I OT Toileting Anticipated Outcome(s): mod i OT Bathroom Transfers Anticipated Outcome(s): mod i OT Recommendation Recommendations for Other Services:  (none) Patient destination: Home Follow Up Recommendations: Home health OT Equipment Recommended: 3 in 1 bedside comode;Tub/shower bench (bariatric DME) Equipment Details: bariatric DME   Skilled Therapeutic Intervention Assessed vision .  R eye deviates laterally and  has diplopia all ranges.    Pt has full AROM with both eyes in all quadrants. Pt is getting clear vision to the lateral right but is not consistent when moving to the left.    Has taped eye glasses on right lens   Needs to be further tested.    OT Evaluation Precautions/Restrictions  Precautions Precautions: Fall Precaution Comments: wound vac Restrictions Weight Bearing Restrictions: No General     Pain Pain Assessment Pain Assessment: 0-10 Pain Score: 8  Pain Type: Acute pain Pain Location: Head Pain Orientation: Anterior Pain Descriptors / Indicators: Headache;Throbbing Pain Frequency: Intermittent Pain Onset: On-going Patients Stated Pain Goal: 2 Pain Intervention(s): Medication (See eMAR) Home Living/Prior [place] expects to be discharged to:: Private residence Living Arrangements: Parent Available Help at Discharge: Family;Available 24 hours/day Type of Home: House Home Access: Ramped entrance Entrance Stairs-Number of Steps: ramp entrance Entrance Stairs-Rails: Right Home Layout: One level Additional Comments:  (Is trying to get a power wc)  Lives With: Other (Comment) IADL History Homemaking Responsibilities: No Mode of Transportation: Car (has not driven in 6 months) Prior Function  Able to Take Stairs?: No ADL   Vision/Perception  Vision- Assessment Eye Alignment: Impaired (comment) ((R)eye deviated laterally,  but able to move in all quadrants) Ocular Range of Motion: Other (comment) (mild  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) Alignment/Gaze Preference: Gaze right Tracking/Visual Pursuits: Decreased smoothness of horizontal tracking;Decreased smoothness of vertical tracking;Decreased smoothness of eye movement to LEFT superior field;Decreased smoothness of eye movement to LEFT inferior field;Impaired - to be further tested in functional context;Right eye does not track medially Saccades: Decreased speed of saccadic movement Convergence: Impaired (comment) Diplopia Assessment: Disappears with one eye closed;Objects split side to side Perception Perception: Within Functional Limits Praxis Praxis: Intact  Cognition Overall Cognitive Status: Within Functional Limits for tasks assessed Arousal/Alertness: Awake/alert Orientation Level: Oriented X4 Sensation Sensation Light Touch: Appears Intact Coordination Gross Motor Movements are Fluid and Coordinated: Yes (both shoulders have decribiutus) Fine Motor Movements are Fluid and Coordinated: Yes Motor  Motor Motor: Within Functional Limits Mobility  Bed Mobility Bed Mobility: Rolling Left;Left Sidelying to Sit Transfers Sit to Stand: 4: Min assist Sit to Stand Details: Verbal cues for technique  Trunk/Postural Assessment  Cervical Assessment Cervical Assessment: Within Functional Limits Thoracic Assessment Thoracic Assessment: Within Functional Limits Lumbar Assessment Lumbar Assessment: Within Functional Limits Postural Control Postural Control: Deficits on evaluation Postural Limitations: increased leaning to right due to wound vac  Balance Balance Balance Assessed: Yes Static Sitting Balance Static Sitting - Balance Support: Feet supported Static Sitting - Level of Assistance: 5: Stand by assistance Dynamic Sitting Balance Sitting balance - Comments: tolerated sitting EOB >20 minutes static and dynamic movements tolerated in  sitting EOB Static Standing Balance Static Standing - Balance Support: During functional activity;Bilateral upper extremity supported Static Standing - Level of Assistance: 4: Min assist Dynamic Standing Balance Dynamic Standing - Balance Support: Bilateral upper extremity supported;During functional activity Dynamic Standing - Level of Assistance: 4: Min assist       FIM:  FIM - Control and instrumentation engineer Devices: Arm rests;HOB elevated;Bed rails;Walker Bed/Chair Transfer: 4: Supine > Sit: Min A (steadying Pt. > 75%/lift 1 leg);4: Bed > Chair or W/C: Min A (steadying Pt. > 75%)   Refer to Care Plan for Long Term Goals  Recommendations for other services: None  Discharge Criteria: Patient will be discharged from OT if patient refuses treatment 3 consecutive times without medical reason, if treatment goals not met, if there is a change in medical status, if patient makes no progress towards goals or if patient is discharged from hospital.  The above assessment, treatment plan, treatment alternatives and goals were discussed and mutually agreed upon: by patient  Lisa Roca 12/21/2013, 6:04 PM

## 2013-12-21 NOTE — Progress Notes (Signed)
Pt reports he has had a headache all day, and that percocet has not been effective today as it usually has been in decreasing his pain. Reports he did some eye exercises in therapy that he hasn't done before and this could be a possible cause. Pt requested IV fentanyl, given at 1748. Pt also receptive to trying aromatherapy, this RN is certified to administer it. Placed one drop of peppermint oil in carrier (lotion) and applied small amount to temples and back of neck. Turned off lights in room and encouraged pt to rest, and to report worsening pain to RN. Continue to monitor. Hortencia Conradi RN.

## 2013-12-21 NOTE — Progress Notes (Signed)
Patient refusing to be weighed at this time.

## 2013-12-22 ENCOUNTER — Inpatient Hospital Stay (HOSPITAL_COMMUNITY): Payer: BC Managed Care – PPO

## 2013-12-22 ENCOUNTER — Inpatient Hospital Stay (HOSPITAL_COMMUNITY): Payer: BC Managed Care – PPO | Admitting: Occupational Therapy

## 2013-12-22 DIAGNOSIS — I5033 Acute on chronic diastolic (congestive) heart failure: Secondary | ICD-10-CM

## 2013-12-22 DIAGNOSIS — R5381 Other malaise: Secondary | ICD-10-CM

## 2013-12-22 DIAGNOSIS — D496 Neoplasm of unspecified behavior of brain: Secondary | ICD-10-CM

## 2013-12-22 LAB — BASIC METABOLIC PANEL
Anion gap: 8 (ref 5–15)
BUN: 23 mg/dL (ref 6–23)
CO2: 33 mEq/L — ABNORMAL HIGH (ref 19–32)
Calcium: 8.8 mg/dL (ref 8.4–10.5)
Chloride: 101 mEq/L (ref 96–112)
Creatinine, Ser: 0.88 mg/dL (ref 0.50–1.35)
GFR calc Af Amer: >90 mL/min (ref >90)
GFR calc non Af Amer: >90 mL/min (ref >90)
Glucose, Bld: 95 mg/dL (ref 70–99)
Potassium: 3.8 mEq/L (ref 3.7–5.3)
Sodium: 142 mEq/L (ref 137–147)

## 2013-12-22 LAB — ACTH
C206 ACTH: 82 pg/mL — ABNORMAL HIGH (ref 6–50)
C206 ACTH: 81 pg/mL — ABNORMAL HIGH (ref 6–50)

## 2013-12-22 LAB — MAGNESIUM: Magnesium: 2 mg/dL (ref 1.5–2.5)

## 2013-12-22 MED ORDER — HYDROMORPHONE HCL 2 MG PO TABS
2.0000 mg | ORAL_TABLET | ORAL | Status: DC | PRN
  Administered 2013-12-22 – 2014-01-01 (×30): 2 mg via ORAL
  Filled 2013-12-22 (×31): qty 1

## 2013-12-22 MED ORDER — OXYCODONE-ACETAMINOPHEN 5-325 MG PO TABS: 1.0000 | ORAL_TABLET | ORAL | Status: DC | PRN

## 2013-12-22 MED ORDER — OXYCODONE-ACETAMINOPHEN 5-325 MG PO TABS
1.0000 | ORAL_TABLET | ORAL | Status: DC | PRN
  Administered 2013-12-22 – 2014-01-01 (×28): 2 via ORAL
  Filled 2013-12-22 (×29): qty 2

## 2013-12-22 MED ORDER — TOPIRAMATE 25 MG PO TABS
25.0000 mg | ORAL_TABLET | Freq: Two times a day (BID) | ORAL | Status: DC
  Administered 2013-12-22 – 2013-12-24 (×5): 25 mg via ORAL
  Filled 2013-12-22 (×7): qty 1

## 2013-12-22 MED ORDER — ALPRAZOLAM 0.5 MG PO TABS
0.5000 mg | ORAL_TABLET | ORAL | Status: DC | PRN
  Administered 2013-12-22 – 2014-01-01 (×16): 0.5 mg via ORAL
  Filled 2013-12-22 (×17): qty 1

## 2013-12-22 MED ORDER — DICLOFENAC SODIUM 1 % TD GEL: 4.0000 g | Freq: Three times a day (TID) | TRANSDERMAL | Status: DC

## 2013-12-22 NOTE — IPOC Note (Addendum)
Overall Plan of Care [facility]) Patient Details Name: [name] [name] MRN: [id number] DOB: [birth year]  Admitting Diagnosis: pituitary tumor  Hospital Problems: Active Problems:   Physical deconditioning     Functional Problem List: Nursing    PT Balance;Endurance;Motor  OT Balance;Endurance;Motor;Pain;Safety;Vision  SLP    TR Edema;Endurance;Motor;Pain       Basic ADL's: OT Grooming;Bathing;Dressing;Toileting;Other (comment) (vision)     Advanced  ADL's: OT       Transfers: PT Bed Mobility;Bed to Chair;Car  OT Toilet;Tub/Shower     Locomotion: PT Ambulation;Wheelchair Mobility     Additional Impairments: OT None  SLP        TR anxiety    Anticipated Outcomes Item Anticipated Outcome  Self Feeding indpendent  Swallowing      Basic self-care  mod I  Toileting  mod i   Bathroom Transfers mod i  Bowel/Bladder     Transfers  mod I for bed mobility and transfers  Locomotion  mod I for ambulation and w/c mobility  Communication     Cognition     Pain     Safety/Judgment      Therapy Plan: PT Intensity: Minimum of 1-2 x/day ,45 to 90 minutes PT Frequency: 5 out of 7 days PT Duration Estimated Length of Stay: 7 to 10 days OT Intensity: Minimum of 1-2 x/day, 45 to 90 minutes OT Frequency: 5 out of 7 days OT Duration/Estimated Length of Stay: 7-9 days TR Duration/ELOS: 10  Days TR Frequency:  Min 1 time per week >20 minutes           Team Interventions: Nursing Interventions    PT interventions Ambulation/gait training;Balance/vestibular training;Discharge planning;DME/adaptive equipment instruction;Functional mobility training;Patient/family education;Neuromuscular re-education;Therapeutic Activities;Stair training;Therapeutic Exercise;UE/LE Coordination activities;UE/LE Strength taining/ROM;Wheelchair propulsion/positioning  OT Interventions Publishing copy;Functional mobility  training;Pain management;Patient/family education;Self Care/advanced ADL retraining;Therapeutic Activities;Therapeutic Exercise;UE/LE Strength taining/ROM;Visual/perceptual remediation/compensation;Wheelchair propulsion/positioning  SLP Interventions    TR Interventions Adaptive equipment instruction;1:1 session;Balance/vestibular training;Functional mobility training;Community reintegration;Patient/family education;Therapeutic activities;Recreation/leisure participation;Therapeutic exercise;UE/LE Coordination activities;Wheelchair propulsion/positioning, Relaxation therapy techniques  SW/CM Interventions      Team Discharge Planning: Destination: PT-Home ,OT- Home , SLP-  Projected Follow-up: PT-Home health PT, OT-  Home health OT, SLP-  Projected Equipment Needs: PT-To be determined, OT- 3 in 1 bedside comode;Tub/shower bench (bariatric DME), SLP-  Equipment Details: PT- , OT-bariatric DME Patient/family involved in discharge planning: PT- Patient,  OT-Patient, SLP-   MD ELOS: 7-10 days Medical Rehab Prognosis:  Excellent Assessment: The patient has been admitted for CIR therapies with the diagnosis of deconditioning related to multiple medical, severe endstage OA of both knees. The team will be addressing functional mobility, strength, stamina, balance, safety, adaptive techniques and equipment, self-care, bowel and bladder mgt, patient and caregiver education, pain mgt, wound care/vac mgt, pacing, nutrition, egosupport, community reintegration. Goals have been set at mod I for mobility and self-care. Meredith Staggers, MD, FAAPMR      See Team Conference Notes for weekly updates to the plan of care

## 2013-12-22 NOTE — Progress Notes (Signed)
Recreational Therapy Assessment and Plan  Patient Details  Name: [name] [name] MRN: [id number] Date of Birth: 04-Jun-1966 Today's Date: 12/22/2013  Rehab Potential: Good ELOS: 10 days   Assessment Clinical Impression: Problem List:  Patient Active Problem List    Diagnosis  Date Noted   .  Physical deconditioning  12/20/2013   .  Panhypopituitarism  12/19/2013   .  Gout  12/15/2013   .  Clinical depression  12/15/2013   .  Acute respiratory failure with hypercapnia  12/07/2013   .  Morbid obesity  12/07/2013   .  Neoplasm of brain causing mass effect on adjacent structures  12/06/2013   .  Suprasellar mass  12/06/2013   .  Wheezing  10/06/2013   .  OSA (obstructive sleep apnea)  10/06/2013   .  Injury of kidney  08/07/2013   .  Acute on chronic diastolic heart failure  53/61/4431   .  Obstructive apnea  08/07/2013   .  Disorder of peripheral nervous system  08/07/2013   .  Generalized OA  08/07/2013   .  Disuse syndrome  08/07/2013   .  Extreme obesity  08/02/2013   .  Chronic diastolic heart failure  54/00/8676   .  Infected wound  07/28/2013   .  Breathing-related sleep disorder  06/10/2013   .  Elevated blood uric acid level  04/23/2013   .  Abdominal wall abscess  12/27/2012   .  Calculus of kidney  11/22/2012   .  Kidney cysts  11/22/2012   .  BP (high blood pressure)  05/21/2012   .  H/O infectious disease  12/04/2011    Past Medical History:  Past Medical History   Diagnosis  Date   .  Sleep apnea    .  Allergic rhinitis    .  Diastolic CHF    .  Gout    .  Nephrolithiasis    .  Peripheral neuropathy    .  Hypertension     Past Surgical History:  Past Surgical History   Procedure  Laterality  Date   .  Sigmoid colonoscopy     .  Hernia repair     .  Abdominal surgery       for cyst with MRSA   .  Craniotomy  N/A  12/10/2013     Procedure: Transpheniodal resection of Pituitary tumor with Dr. Radene Journey for approach; Surgeon: Floyce Stakes, MD;  Location: [facility] NEURO ORS; Service: Neurosurgery; Laterality: N/A; Transpheniodal resection of Pituitary tumor with Dr. Radene Journey for approach   .  Transnasal approach  N/A  12/10/2013     Procedure: TRANSNASAL APPROACH; Surgeon: Rozetta Nunnery, MD; Location: [facility] NEURO ORS; Service: ENT; Laterality: N/A;    Assessment & Plan  Clinical Impression: Clinical Impression: PatientCorbrett [name] is a 48 y.o. male with history of HTN, OSA, diastolic CHF, morbid obesity, chronic abdominal wound with recent abdominal wall abscess s/p I and D 06/06/13 (with prolonged hospitalization and CIR stay 08/2013 in [place]) who was admitted on 12/06/13 with 2 day history of HA, blurry vision with right ptosis, hematemesis, and hypercarbic respiratory failure. He was transferred from [facility] to [facility] for management as CT head revealed prominent, large sellar & suprasellar mass lesion with extension into the R cavernous sinus, ssuperior extension beyond the diaphragm sella. Osseous destruction with extension into sphenoid sinus. He was placed on BIPAP and clear liquid diet. Dr. Doy Mince consulted for input and labs done  revealing ACTH 135, FH <0.1, FSH < 0.3, TSH 0.137, Free T4- 0.81 and Cortisol level 31.2. Dr. Joya Salm and [facility]NT consulted for input on surgical intervention. Dr. Tommy Medal and Dr. Georgette Dover consulted for input on chronic abdominal wound and recommended continuing fluconazole and no need for antibiotics as no signs of infection noted. To continue with VAC and cleared to proceed with surgery per CCS input. He has had issues with anxiety requiring BIPAP for labored breathing. On 12/10/13, he underwent transphenoidal resection of pituitary tumor by Dr Radene Journey and Dr. Joya Salm. He was extubated on 07/09 and cleared for regular diet. 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 Nasal splints removed with improvement in breathing.  Repeat cortisol level down to 12.4. Case discussed with Dr. Buddy Duty who recommends monitoring for DI and to follow up in the office past discharge. 2D echo done 07/17 revealing EF 45-55% with hypokinesis of inferior myocardium and mild MVR with mild LVH. Patient continues to be limited by diplopia, generalized weakness, pain as well as body habitus. Therapy initiated and CIR recommended by rehab team. Patient transferred to CIR on 12/20/2013 .   Pt presents with decreased activity tolerance, decreased functional mobility, decreased balance, anxiety Limiting pt's independence with leisure/community pursuits.   Leisure History/Participation Premorbid leisure interest/current participation: [employer] store;Community - Travel (Comment) Expression Interests: Music (Comment) Other Leisure Interests: Television;Movies;Reading;Computer;Cooking/Baking Leisure Participation Style: With Family/Friends Awareness of Community Resources: Good-identify 3 post discharge leisure resources Psychosocial / Spiritual Social interaction - Mood/Behavior: Cooperative Academic librarian Appropriate for Education?: Yes Recreational Therapy Orientation Orientation -Reviewed with patient: Available activity resources Strengths/Weaknesses Patient Strengths/Abilities: Willingness to participate Patient weaknesses: Physical limitations TR Patient demonstrates impairments in the following area(s): Edema;Endurance;Motor;Pain  Plan Rec Therapy Plan Is patient appropriate for Therapeutic Recreation?: Yes Rehab Potential: Good Treatment times per week: Min 1 time per week >20 minutes Estimated Length of Stay: 10 days TR Treatment/Interventions: Adaptive equipment instruction;1:1 session;Balance/vestibular training;Functional mobility training;Community reintegration;Patient/family education;Therapeutic activities;Recreation/leisure participation;Therapeutic exercise;UE/LE Coordination  activities;Wheelchair propulsion/positioning Recommendations for other services: Neuropsych  Recommendations for other services: Neuropsych  Discharge Criteria: Patient will be discharged from TR if patient refuses treatment 3 consecutive times without medical reason.  If treatment goals not met, if there is a change in medical status, if patient makes no progress towards goals or if patient is discharged from hospital.  The above assessment, treatment plan, treatment alternatives and goals were discussed and mutually agreed upon: by patient  [place] 12/22/2013, 3:36 PM

## 2013-12-22 NOTE — Progress Notes (Signed)
Received a call from [facility] PA-C for Dr. Alvan Dame.  He has reviewed notes and discussed with Dr. Alvan Dame. Patient does not have any restrictions on his knees and Dr. Alvan Dame would like him to recover from this surgery and follow up in office prior to  starting Synvisc injections.

## 2013-12-22 NOTE — Progress Notes (Signed)
Physical Therapy Session Note  Patient Details  Name: [name] [name] MRN: [id number] Date of Birth: 1966-01-27  Today's Date: 12/22/2013 Time: 9381-8299 ; [id number]  Time Calculation (min): 60 min; 60 min  Short Term Goals: Week 1:  PT Short Term Goal 1 (Week 1): Pt will increase supine to edge of bed. edge of bed to supine with side rail and S.  PT Short Term Goal 2 (Week 1): Pt will increase transfer bed to chair, chair to bed with LRAD to S.  PT Short Term Goal 3 (Week 1): Pt will increase ambulation with LRAD to S about 15 feet.  PT Short Term Goal 4 (Week 1): Pt will increase w/c mobility to S about 25 to 50 feet.   Skilled Therapeutic Interventions/Progress Updates:  Tx 1:Pt pleasant, but obviously anxious; verbose.    Therapeutic exercise performed with LEs to increase strength for functional mobility: in supine- 1 x 10 each modified abdominal crunches, lower trunk rotation, R and L hip abduction; 1 x 5 R and L quad sets; 1 x 15 bil hip IR and adduction, 1 x 20 R and L ankle pumps.  In flat bed, using rails, pt scooted up in bed with supervision, non slip socks on for extra traction.  L sideying with use of railing, up to sittting with supervision, cues to breathe throughout.  Sitting EOB x 2 minutes before attempting to stand, c//o mild dizziness.  Sit> stand using bed rails and RW, +2 for safety but pt performing >50% of effort.  Stand step transfer into w/c to R, with difficulty sitting safely.  Once legrests in place, pt able to use LEs to assist in scooting back in w/c.  Pt left sitting up in w/c, all needs within reach.  Tx 2:  Pt has been sitting up in w/c since AM, without c/o.  He stated he felt a little "woozy" from pain meds; HA rated 4/10.  Sit> stand from w/c x 3, for 30 seconds, 40 seconds, 40 seconds.  +2 for guarding, mod VCs for slow deep breathing to decrease anxiety. Standing limited by bil knee pain, crepitus heard and felt upon standing.    Therapeutic activities in sitting in w/c: locking/unlocking brakes with supervision, bil UE use on 3# bar to tap volleyball in all directions with back supported> back unsupported for activity tolerance and trunk strengthening, and kicking ball with L and R foot forward and sideways for LE and trunk strengthening.  Ex HR 106; O2 sats 90-95% with cues for deep breathing.  Stand step transfer with RW w/c> bed to L with +2 for safety, min assist.  RN giving meds; bed alarm set and all needs within reach.    Therapy Documentation Precautions:  Precautions Precautions: Fall Precaution Comments: wound vac Restrictions Weight Bearing Restrictions: No   Pain: Pain Assessment Pain Assessment: 0-10 Pain Score: 5  Pain Type: Acute pain Pain Location: Head Pain Descriptors / Indicators: Headache Pain Intervention(s): Medication (See eMAR)      See FIM for current functional status  Therapy/Group: Individual Therapy  [name] 12/22/2013, 11:25 AM

## 2013-12-22 NOTE — H&P (Addendum)
Physical Medicine and Rehabilitation Admission H&P    No chief complaint on file. : HPI: [name] [name] is a 48 y.o. male with history of HTN, OSA, diastolic CHF, morbid obesity, chronic abdominal wound with recent abdominal wall abscess s/p I and D 06/06/13 (with prolonged hospitalization and CIR stay 08/2013 in [place]) who was admitted on 12/06/13 with 2 day history of HA, blurry vision with right ptosis, hematemesis, and hypercarbic respiratory failure. He was transferred from [facility] to [facility] for management as CT head revealed prominent, large sellar & suprasellar mass lesion with extension into the R cavernous sinus, ssuperior extension beyond the diaphragm sella. Osseous destruction with extension into sphenoid sinus. He was placed on BIPAP and clear liquid diet. Dr. Doy Mince consulted for input and labs done revealing ACTH 135, FH <0.1, FSH < 0.3, TSH 0.137, Free T4- 0.81 and Cortisol level 31.2.  Dr. Joya Salm and [facility] ENT consulted for input on surgical intervention. Dr. Tommy Medal and Dr. Georgette Dover consulted for input on chronic abdominal wound and recommended continuing fluconazole and no need for antibiotics as no signs of infection noted. To continue with VAC and cleared to proceed with surgery per [facility] input. He has had issues with anxiety requiring BIPAP for labored breathing. On 12/10/13, he underwent transphenoidal resection of pituitary tumor by Dr Radene Journey and Dr. Joya Salm. He was extubated on 07/09 and cleared for regular diet. Last cortisol level remains elevated at 21.1 and Endocrine follow up recommended for input. Patient continues to be limited by diplopia, generalized weakness, pain as well as body habitus. Therapy initiated and CIR recommended by rehab team.  Patient was independent for stand pivot transfer PTA. Was in process of getting motorized WC due to ortho (Drs Murphy/Olin) recommendations to limit mobility to transfers only for now secondary to concerns of fall/fracture. He  was in process of losing weight in order to have B-TKR in the futures and has lost 60 lbs in past 6 months. Has been followed by ortho   ROS Headache--frontal,occipital, bilateral knee pains. Slept well with cpap, still sob with activity. Vac staying sealed  Past Medical History  Diagnosis Date  . Sleep apnea   . Allergic rhinitis   . Diastolic CHF   . Gout   . Nephrolithiasis   . Peripheral neuropathy   . Hypertension    Past Surgical History  Procedure Laterality Date  . Sigmoid colonoscopy    . Hernia repair    . Abdominal surgery      for cyst with MRSA  . Craniotomy N/A 12/10/2013    Procedure: Transpheniodal resection of Pituitary tumor with Dr. Radene Journey for approach;  Surgeon: Floyce Stakes, MD;  Location: [facility] NEURO ORS;  Service: Neurosurgery;  Laterality: N/A;  Transpheniodal resection of Pituitary tumor with Dr. Radene Journey for approach  . Transnasal approach N/A 12/10/2013    Procedure: TRANSNASAL APPROACH;  Surgeon: Rozetta Nunnery, MD;  Location: [facility] NEURO ORS;  Service: ENT;  Laterality: N/A;   Family History  Problem Relation Age of Onset  . Hypertension Mother   . Hypertension Father   . Gout Father   . Hypothyroidism Father   . Gout Brother    Social History:  reports that he has never smoked. He has never used smokeless tobacco. He reports that he does not drink alcohol or use illicit drugs. Allergies:  Allergies  Allergen Reactions  . Penicillins Anaphylaxis and Other (See Comments)    Unknown reaction  . Morphine And Related  Hives and Itching   Medications Prior to Admission  Medication Sig Dispense Refill  . albuterol (PROAIR HFA) 108 (90 BASE) MCG/ACT inhaler Inhale 1-2 puffs into the lungs every 6 (six) hours as needed for wheezing or shortness of breath.  1 Inhaler  5  . allopurinol (ZYLOPRIM) 300 MG tablet Take 300 mg by mouth daily.      Marland Kitchen ALPRAZolam (XANAX) 0.25 MG tablet Take 1 tablet (0.25 mg total) by mouth every 4 (four) hours as  needed for anxiety.  30 tablet  0  . Amino Acids-Protein Hydrolys (FEEDING SUPPLEMENT, PRO-STAT SUGAR FREE 64,) LIQD Take 30 mLs by mouth 3 (three) times daily with meals.  900 mL  0  . carvedilol (COREG) 12.5 MG tablet Take 1 tablet (12.5 mg total) by mouth 2 (two) times daily with a meal.  60 tablet  0  . citalopram (CELEXA) 40 MG tablet Take 40 mg by mouth daily.      . cyclobenzaprine (FLEXERIL) 10 MG tablet Take 10 mg by mouth daily as needed for muscle spasms.      . diclofenac (VOLTAREN) 75 MG EC tablet Take 1 tablet (75 mg total) by mouth 2 (two) times daily.  60 tablet  0  . Diclofenac-Misoprostol (ARTHROTEC) 75-0.2 MG TBEC Take 1 tablet by mouth 2 (two) times daily.       Marland Kitchen enoxaparin (LOVENOX) 100 MG/ML injection Inject 1 mL (100 mg total) into the skin daily.  30 mL  0  . fluconazole (DIFLUCAN) 100 MG tablet Take 1 tablet (100 mg total) by mouth daily.  30 tablet  0  . fluticasone (FLONASE) 50 MCG/ACT nasal spray Place 2 sprays into both nostrils daily as needed for allergies or rhinitis.      . furosemide (LASIX) 40 MG tablet Take 1 tablet (40 mg total) by mouth daily.  30 tablet  0  . Gabapentin, PHN, (GRALISE) 300 MG TABS Take 900 mg by mouth 3 (three) times daily.      Marland Kitchen HYDROcodone-acetaminophen (NORCO/VICODIN) 5-325 MG per tablet Take 1-2 tablets by mouth every 6 (six) hours as needed for moderate pain.       Marland Kitchen lactulose (CHRONULAC) 10 GM/15ML solution Take 45 mLs (30 g total) by mouth 2 (two) times daily as needed for moderate constipation.  500 mL  0  . levothyroxine (SYNTHROID, LEVOTHROID) 50 MCG tablet Take 1 tablet (50 mcg total) by mouth daily before breakfast.  30 tablet  0  . lidocaine (XYLOCAINE) 4 % external solution Apply topically daily as needed for mild pain.  50 mL  0  . loratadine (CLARITIN) 10 MG tablet Take 10 mg by mouth daily as needed for allergies.      . misoprostol (CYTOTEC) 200 MCG tablet Take 1 tablet (200 mcg total) by mouth 2 (two) times daily.  60  tablet  0  . Multiple Vitamin (MULTIVITAMIN) tablet Take 1 tablet by mouth daily. Centrum brand      . Nutritional Supplements (JUVEN PO) Take 1 packet by mouth 2 (two) times daily. Wound healing      . ondansetron (ZOFRAN) 4 MG/2ML SOLN injection Inject 2 mLs (4 mg total) into the vein every 6 (six) hours as needed for nausea or vomiting.  2 mL  0  . polyethylene glycol (MIRALAX / GLYCOLAX) packet Take 17 g by mouth daily.  14 each  0  . potassium chloride (K-DUR,KLOR-CON) 10 MEQ tablet Take 10 mEq by mouth 2 (two) times daily.      Marland Kitchen  Prenatal Vit-Fe Fumarate-FA (PRENATAL PO) Take 1 tablet by mouth daily. For wound healing      . Probiotic Product (PROBIOTIC DAILY) CAPS Take 1 capsule by mouth daily.      . pseudoephedrine (SUDAFED) 120 MG 12 hr tablet Take 120 mg by mouth daily as needed for congestion.      . senna-docusate (SENOKOT-S) 8.6-50 MG per tablet Take 1 tablet by mouth 2 (two) times daily.  60 tablet  0  . testosterone cypionate (DEPOTESTOTERONE CYPIONATE) 200 MG/ML injection Inject 0.5 mLs (100 mg total) into the muscle every 14 (fourteen) days.  10 mL  0  . vitamin C (ASCORBIC ACID) 500 MG tablet Take 500 mg by mouth daily.        Home: Home Living Family/patient expects to be discharged to:: Private residence Living Arrangements: Parent Available Help at Discharge: Family;Available 24 hours/day Type of Home: House Home Access: Ramped entrance Entrance Stairs-Number of Steps: ramp entrance Entrance Stairs-Rails: Right Home Layout: One level Additional Comments:  (Is trying to get a power wc)  Lives With: Other (Comment)   Functional History: Prior Function Comments: pt's mother is primary care taker  Functional Status:  Mobility:     Ambulation/Gait Ambulation Distance (Feet): 4 Feet    ADL:    Cognition: Cognition Overall Cognitive Status: Within Functional Limits for tasks assessed Arousal/Alertness: Awake/alert Orientation Level: Oriented X4 Awareness:  Appears intact Safety/Judgment: Appears intact Cognition Overall Cognitive Status: Within Functional Limits for tasks assessed  Physical Exam: Blood pressure 140/94, pulse 77, temperature 98.3 F (36.8 C), temperature source Oral, resp. rate 19, weight 205.933 kg (454 lb), SpO2 97.00%. Physical Exam  Morbidly obese.  HENT:  Head: Normocephalic and atraumatic.  Eyes: Conjunctivae are normal. Pupils are equal, round, and reactive to light.  Tends to keep right eye closed  Neck: Normal range of motion. Neck supple.  Cardiovascular: Normal rate and regular rhythm.  No murmur heard.  Respiratory: Effort normal. No respiratory distress. He has wheezes.  GI: Soft. Bowel sounds are normal. He exhibits no distension. There is no tenderness.  Obese with VAC in place.  Musculoskeletal: He exhibits no edema.  Swelling about both knees. Tender with ROM Neurological: He is alert and oriented to person, place, and time.  Delayed response  Skin: Skin is warm and dry.  Psychiatric: anxious  Results for orders placed during the hospital encounter of 12/20/13 (from the past 48 hour(s))  BASIC METABOLIC PANEL     Status: Abnormal   Collection Time    12/21/13  5:17 AM      Result Value Ref Range   Sodium 144  137 - 147 mEq/L   Potassium 3.6 (*) 3.7 - 5.3 mEq/L   Chloride 102  96 - 112 mEq/L   CO2 32  19 - 32 mEq/L   Glucose, Bld 95  70 - 99 mg/dL   BUN 21  6 - 23 mg/dL   Creatinine, Ser 0.81  0.50 - 1.35 mg/dL   Calcium 8.6  8.4 - 10.5 mg/dL   GFR calc non Af Amer >90  >90 mL/min   GFR calc Af Amer >90  >90 mL/min   Comment: (NOTE)     The eGFR has been calculated using the CKD EPI equation.     This calculation has not been validated in all clinical situations.     eGFR's persistently <90 mL/min signify possible Chronic Kidney     Disease.   Anion gap 10  5 - 15  MAGNESIUM     Status: None   Collection Time    12/21/13  5:17 AM      Result Value Ref Range   Magnesium 2.0  1.5 - 2.5  mg/dL  BASIC METABOLIC PANEL     Status: Abnormal   Collection Time    12/22/13  5:00 AM      Result Value Ref Range   Sodium 142  137 - 147 mEq/L   Potassium 3.8  3.7 - 5.3 mEq/L   Chloride 101  96 - 112 mEq/L   CO2 33 (*) 19 - 32 mEq/L   Glucose, Bld 95  70 - 99 mg/dL   BUN 23  6 - 23 mg/dL   Creatinine, Ser 0.88  0.50 - 1.35 mg/dL   Calcium 8.8  8.4 - 10.5 mg/dL   GFR calc non Af Amer >90  >90 mL/min   GFR calc Af Amer >90  >90 mL/min   Comment: (NOTE)     The eGFR has been calculated using the CKD EPI equation.     This calculation has not been validated in all clinical situations.     eGFR's persistently <90 mL/min signify possible Chronic Kidney     Disease.   Anion gap 8  5 - 15  MAGNESIUM     Status: None   Collection Time    12/22/13  5:00 AM      Result Value Ref Range   Magnesium 2.0  1.5 - 2.5 mg/dL   No results found.     Medical Problem List and Plan: 1. Functional deficits secondary to deconditioning related to medical issues, pituitary tumor resection 2.  DVT Prophylaxis/Anticoagulation: Pharmaceutical: Lovenox 3. Pain Management: robaxin, percocet, add dilaudid for more severe/acute pain  -add topamax for headaches  -arthrotec 4. Mood: xanax prn, celexa, egosupport 5. Neuropsych: This patient is capable of making decisions on his own behalf. 6. Pituitary tumor: path pending.  7. Wound care---vac per woc 4n 8. Enstage OA of bilateral knees--arthrotec   -pt followed by Dr. Alvan Dame. Recent xrays done  -was scheduled for synvisc. Will ask ortho to review options with patient----probably will want to wait to inject him until he's out of the hospital      Post Admission Physician Evaluation: 1. Functional deficits secondary  to deconditioning, endstage OA of bilateral knees. 2. Patient is admitted to receive collaborative, interdisciplinary care between the physiatrist, rehab nursing staff, and therapy team. 3. Patient's level of medical complexity and  substantial therapy needs in context of that medical necessity cannot be provided at a lesser intensity of care such as a SNF. 4. Patient has experienced substantial functional loss from his/her baseline which was documented above under the "Functional History" and "Functional Status" headings.  Judging by the patient's diagnosis, physical exam, and functional history, the patient has potential for functional progress which will result in measurable gains while on inpatient rehab.  These gains will be of substantial and practical use upon discharge  in facilitating mobility and self-care at the household level. 5. Physiatrist will provide 24 hour management of medical needs as well as oversight of the therapy plan/treatment and provide guidance as appropriate regarding the interaction of the two. 6. 24 hour rehab nursing will assist with bladder management, bowel management, safety, skin/wound care, disease management, medication administration and pain management  and help integrate therapy concepts, techniques,education, etc. 7. PT will assess and treat for/with: Lower extremity strength, range of motion, stamina, balance, functional mobility, safety, adaptive  techniques and equipment, pian mgt.   Goals are: mod I to supervision w/c level. 8. OT will assess and treat for/with: ADL's, functional mobility, safety, upper extremity strength, adaptive techniques and equipment.   Goals are: mod I to supervision at w/c level. 9. SLP will assess and treat for/with: n/a.  Goals are: n/a . 10. Case Management and Social Worker will assess and treat for psychological issues and discharge planning. 11. Team conference will be held weekly to assess progress toward goals and to determine barriers to discharge. 12. Patient will receive at least 3 hours of therapy per day at least 5 days per week. 13. ELOS: 7-10 days       14. Prognosis:  good     Meredith Staggers, MD, [facility]   12/22/2013

## 2013-12-22 NOTE — Progress Notes (Signed)
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 Date: 07/06/13 Deduct: $1500/met Out of Pocket Max: $4000/met Life Max: none  CIR: 80% SNF: 80$  Outpatient: $40 copay per visit Co-Pay: 20 visits combined  Home Health: 100% Co-Pay: 120 visits per year  DME: 80% Co-Pay: 20%  Providers: in network   SECONDARY: none  Medicaid Application Date: Case Manager:  Disability Application Date: Case Worker:  Pt states he is receiving long term disability through his work. Has not began federal disability application and would like assistance to begin if appropriate.  Emergency Contact Information  Contact Information    Name  Relation  Home  Work  Mobile    [place]  Mother  [phone]        Current Medical History  Patient Admitting Diagnosis: deconditioning after multiple medical, pituitary tumor resection  History of Present Illness: [name] [name] is a 48 y.o. male with history of HTN, OSA, diastolic CHF, morbid obesity, chronic abdominal wound with recent abdominal wall abscess s/p I and D 06/06/13 (with prolonged hospitalization and CIR stay 08/2013 in [place]) who was admitted on 12/06/13 with 2 day history of HA, blurry vision with right ptosis, hematemesis, and hypercarbic respiratory failure.  He was transferred from [facility] to [facility] for management as CT head revealed prominent, large sellar & suprasellar mass lesion with extension into the R cavernous sinus, ssuperior extension beyond the diaphragm  sella. Osseous destruction with extension into sphenoid sinus. He was placed on BIPAP and clear liquid diet. Dr. Doy Mince consulted for input and labs done revealing ACTH 135, FH <0.1, FSH < 0.3, TSH 0.137, Free T4- 0.81 and Cortisol level 31.2.  Dr. Joya Salm and [facility] consulted for input on surgical intervention. Dr. Tommy Medal and Dr. Georgette Dover consulted for input on chronic abdominal wound and recommended continuing fluconazole and no need for antibiotics as no signs of infection noted. To continue with VAC and cleared to proceed with surgery per [facility] input.  He has had issues with anxiety requiring BIPAP for labored breathing. On 12/10/13, he underwent transphenoidal resection of pituitary tumor by Dr Radene Journey and Dr. Joya Salm. He was extubated on 07/09 and cleared for regular diet. Last cortisol level remains elevated at 21.1 and Endocrine follow up recommended for input. Patient continues to be limited by diplopia, generalized weakness, pain as well as body habitus.  Patient was independent for stand pivot transfer PTA. Was in process of getting motorized WC due to ortho (Drs Murphy/Olin) recommendations to limit mobility to transfers only for now secondary to concerns of fall/fracture. He was in process of losing weight in order to have B-TKR in the futures and has lost 60 lbs in past 6 months.  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 Wynelle Cleveland spoke with Dr Dagmar Hait who recommended repeating TSH, FT4, ACTH and Cortisol and suggested we watch for DI symptoms and hypotension .  - appt with Dr Buddy Duty made for 7/ 24@ 8 AM  -In  addition have ordered LH/FSH, testosterone panel  -7/17 spoke with Dr. 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 Specimen has been sent to [facility] for second opinion.  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  -Start Synthroid 50 mcg daily, rehabilitation/PCP to titrate to effect  -start Testosterone 100 mg IM q 14 days  -Repeat ACTH/cortisol pending, however previous cortisol was within normal limits so will hold off on starting hydrocortisone at this time. Most likely patient will need hydrocortisone in the future.  Past Medical History  Past Medical History   Diagnosis  Date   .  Sleep apnea    .  Allergic rhinitis    .  Diastolic CHF    .  Gout    .  Nephrolithiasis    .  Peripheral neuropathy    .  Hypertension     Family History  family history includes Gout in his brother and father; Hypertension in his father and mother; Hypothyroidism in his father.  Prior Rehab/Hospitalizations: CIR in [place] after long hospitalization for 10 days and d/c'd to Mom's home for 24/7 assist. Pt states he did not collaborate with therapists a lot in goal setting in [place]. I have encouraged him to speak up and state his needs in goal setting to get the most out of his inpt rehab stay. To ask questions and to assist in his overall plan of care.  Current Medications  Current facility-administered medications:0.9 % sodium chloride infusion, 250 mL, Intravenous, PRN, Donita Brooks, NP, Last Rate: 10 mL/hr at 12/14/13 0600, 250 mL at 12/14/13 0600; ALPRAZolam (XANAX) tablet 0.25 mg, 0.25 mg, Oral, Q4H PRN, Rush Farmer, MD, 0.25 mg at 12/18/13 2103; bisacodyl (DULCOLAX) suppository 10 mg, 10 mg, Rectal, Daily PRN, Cherene Altes, MD  carvedilol (COREG) tablet 6.25 mg, 6.25 mg, Oral, BID WC, Cherene Altes, MD, 6.25 mg at 12/19/13 0831; citalopram (CELEXA) tablet 40 mg, 40 mg, Oral, Daily, Erick Colace, NP, 40 mg at 12/19/13 1042; diclofenac (VOLTAREN) EC tablet 75 mg, 75 mg, Oral, BID, Cherene Altes, MD, 75 mg at 12/19/13 1041; enoxaparin (LOVENOX) injection 100 mg, 100 mg, Subcutaneous, Q24H, Floyce Stakes, MD, 100 mg at 12/18/13 1551  feeding  supplement (PRO-STAT SUGAR FREE 64) liquid 30 mL, 30 mL, Oral, TID WC, Heather Cornelison Pitts, RD; fentaNYL (SUBLIMAZE) injection 25-50 mcg, 25-50 mcg, Intravenous, Q2H PRN, Juanito Doom, MD, 50 mcg at 12/19/13 0536; fluconazole (DIFLUCAN) tablet 100 mg, 100 mg, Oral, Daily, Truman Hayward, MD, 100 mg at 12/19/13 1041; furosemide (LASIX) tablet 40 mg, 40 mg, Oral, Daily, Cherene Altes, MD, 40 mg at 12/19/13 1041  lactulose (CHRONULAC) 10 GM/15ML solution 30 g, 30 g, Oral, BID PRN, Cherene Altes, MD; levothyroxine (SYNTHROID, LEVOTHROID) tablet 50 mcg, 50 mcg, Oral, QAC breakfast, Allie Bossier, MD; lidocaine (XYLOCAINE) 4 % external solution, , Topical, Daily PRN, Rush Farmer, MD, 5 mL at 12/08/13 0954; magnesium citrate solution 1 Bottle, 1 Bottle, Oral, Daily PRN, Cherene Altes, MD  misoprostol (CYTOTEC) tablet 200 mcg, 200 mcg, Oral, BID, Cherene Altes, MD, 200 mcg at 12/19/13 1041; multivitamin with minerals tablet 1 tablet, 1 tablet, Oral, Daily, Cherene Altes, MD, 1 tablet at 12/19/13 1042; nutrition supplement (JUVEN) (JUVEN) powder packet 1 packet, 1 packet, Oral, BID BM, Heather Cornelison Pitts, RD, 1 packet at 12/19/13  1042  ondansetron (ZOFRAN) injection 4 mg, 4 mg, Intravenous, Q6H PRN, Donita Brooks, NP, 4 mg at 12/17/13 2312; oxyCODONE-acetaminophen (PERCOCET/ROXICET) 5-325 MG per tablet 1 tablet, 1 tablet, Oral, Q4H PRN, Juanito Doom, MD, 1 tablet at 12/19/13 0851; polyethylene glycol (MIRALAX / GLYCOLAX) packet 17 g, 17 g, Oral, Daily, Cherene Altes, MD, 17 g at 12/16/13 1008  senna-docusate (Senokot-S) tablet 1 tablet, 1 tablet, Oral, BID, Cherene Altes, MD, 1 tablet at 12/17/13 2300; sodium chloride 0.9 % injection 10-40 mL, 10-40 mL, Intracatheter, Q12H, Juanito Doom, MD, 30 mL at 12/18/13 2107; testosterone cypionate (DEPOTESTOTERONE CYPIONATE) injection 100 mg, 100 mg, Intramuscular, Q14 Days, Allie Bossier, MD  Patients Current Diet:  Cardiac  Precautions / Restrictions  Precautions  Precautions: Fall  Precaution Comments: wound vac  Restrictions  Weight Bearing Restrictions: No  Prior Activity Level  Limited Community (1-2x/wk): has been in [place] with parents for 3 months after being at an inpt rehab in [place] for 10 days  [place] Devices/Equipment: Environmental consultant (specify type);Hospital bed;Bedside commode/3-in-1;Wheelchair  Home Equipment: Gilford Rile - 2 wheels;Wheelchair - [employer];Hospital bed (working with MD on trying to get power chair)  Was working with Dr. Halford Chessman, pulmonoligist, pta on getting at home sleep study to qualify for home CPAP/Bipap.  Was not on home O2 pta  Prior Functional Level  Prior Function  Level of Independence: Independent with assistive device(s)  Comments: ; currently has wound vac R lateral lower abdomen. States he has had vac since January.; wound carea nurse comes out to house 2 days/week. therapists hadjust D/C ed pt.  Current Functional Level  Cognition  Overall Cognitive Status: Within Functional Limits for tasks assessed  Orientation Level: Oriented X4   Extremity Assessment  (includes Sensation/Coordination)     ADLs  Overall ADL's : Needs assistance/impaired  Eating/Feeding: Modified independent (liquid diet)  Grooming: Set up;Sitting  Upper Body Bathing: Minimal assitance;Sitting  Lower Body Bathing: Maximal assistance;Sit to/from stand  Upper Body Dressing : Moderate assistance;Sitting  Lower Body Dressing: Maximal assistance;Sit to/from stand  Toilet Transfer: Minimal assistance;Stand-pivot;+2 for safety/equipment  Toilet Transfer Details (indicate cue type and reason): bariactirc BSC  Toileting- Clothing Manipulation and Hygiene: Set up;Sitting/lateral lean  Toileting - Clothing Manipulation Details (indicate cue type and reason): foley  Functional mobility during ADLs: Minimal assistance;Rolling walker;+2 for safety/equipment   General ADL Comments: Making excellent progress. Demonstrating progress with endurance for ADL   Mobility  Overal bed mobility: Needs Assistance  Bed Mobility: Sit to Supine  Rolling: Supervision  Sidelying to sit: Supervision  General bed mobility comments: heavily using rails to assist with mobility   Transfers  Overall transfer level: Needs assistance  Equipment used: Rolling walker (2 wheeled)  Transfers: Sit to/from Stand;Stand Pivot Transfers  Sit to Stand: +2 safety/equipment;Min assist  Stand pivot transfers: Min assist;+2 safety/equipment  General transfer comment: 2nd person for safety only   Ambulation / Gait / Stairs / Emergency planning/management officer  Ambulation/Gait  Ambulation/Gait assistance: Fish farm manager (Feet): 12 Feet  Assistive device: Rolling walker (2 wheeled)  Gait Pattern/deviations: Step-to pattern;Shuffle;Wide base of support  General Gait Details: able to take steps across room and then perform steps to turn 180 to chair   Posture / Balance  Dynamic Sitting Balance  Sitting balance - Comments: tolerated sitting EOB >20 minutes static and dynamic movements tolerated in sitting EOB   Special needs/care consideration  BiPAP/CPAP NEW to BIPAP this admission.  Continuous Drip IV no  Oxygen O2 at 2 to 4 liters continuous during the day until 7/17 and RN is assessing O2 sats on room air today  Special Bed bariatric with overlay mattress  Wound Vac (area) see below  Pt seen by [facility] nurse since admit. Pt with chronic wound in RLQ. VAC three times per week with HHRN from [facility]. Also followed outpt wound care center at [facility] with Dr. Lin Landsman.  Skin 7/15 WOC follow up non healing surgical wound. Measurement 2.o cm x 11 cm x 7 cm with tract at 2 o'clock that is 10 cm.wound bed beefy red, granualtion but deeper in the wound bed it is nongranular, pink and moist.  WOC wound follow up 12/19/2013  Sponge in the wound bed injected with 10cc Lidocaine 30 minutes prior to  dressing removal and the bedside nurse has administered PO pain meds.  Wound type: NPWT VAC dressing; RLQ abdominal wound  Measurement: see 12/17/13 note  Wound bed: clean, granulation tissue present, but more pale and non granular in the base  Drainage (amount, consistency, odor) slight odor today ,however I believe more related to canister that needed to be changed. After cleansing odor is not present  Periwound: new area of denudation from 5-8 o'clock, does not appear to be candida overgrowth like before. Some contact dermatitis related to use of VAC drape.  Dressing procedure/placement/frequency:  Dressing removed and the periwound cleansed thoroughly and dried well. Antifungal powder used and no sting skin prep used to crust the periwound skin. Long strip of black foam used to fill down into the tunneled area and then the remainder of the wound bed. Skin protected with drape for a bridge of black foam away from wound. Seal at 142mmHG , pt tolerated without problems.  WOC will follow upon transfer to the inpatient rehab setting  [facility], [place]  [phone]  Bowel mgmt:continent  Bladder mgmt:continent  Diabetic mgmt Hgb A1C 5.8 when checked not c/w DM, so CBGS discontinued  PER PCCMMorbid obesity,OSA / OHS,Chronic hypercarbic respiratory failure  Supplemental oxygen for SpO=>92  Auto-BiPAP qhs  Mobilize as able  Avoid sedatives / hypnotics  Polysomnography as outpatient  F/u appointment with Dr. Halford Chessman   Previous Home Environment  Living Arrangements: Parent  Lives With: Other (Comment) (parent, Mom , Event organiser)  Available Help at Discharge: Family;Available 24 hours/day  Type of Home: House  Home Layout: One level  Home Access: Ramped entrance  Entrance Stairs-Rails: Right  Entrance Stairs-Number of Steps: ramp entrance  Bathroom Shower/Tub: Administrator, Civil Service: Standard  Bathroom Accessibility: Yes  How Accessible: Accessible via walker  Home Care Services: Yes   Type of Home Care Services: [place] (if known): [facility]  Additional Comments: Has ordered powerred w/c  Discharge Living Setting  Plans for Discharge Living Setting: Lives with (comment);Other (Comment) (plans to return home with his Mom, Patty)  Type of Home at Discharge: House  Discharge Home Layout: One level  Discharge Home Access: Stairs to enter;Ramped entrance  Entrance Stairs-Rails: Right  Entrance Stairs-Number of Steps: ramp entrance  Discharge Bathroom Shower/Tub: Tub/shower unit  Discharge Bathroom Toilet: Standard  Discharge Bathroom Accessibility: Yes  How Accessible: Accessible via walker  Does the patient have any problems obtaining your medications?: No  Social/Family/Support Systems  Patient Roles: Other (Comment) (was on short term disability and the let go from his job, Ma)  Sport and exercise psychologist Information: Almetta Lovely, Mom  Anticipated Caregiver: Mom  Anticipated Caregiver's Contact Information:  [phone]  Ability/Limitations of Caregiver: Mom has BLE edema with her own difficulty walking distance. Transport chair to pt room to visit  Caregiver Availability: 24/7  Discharge Plan Discussed with Primary Caregiver: Yes  Is Caregiver In Agreement with Plan?: Yes  Does Caregiver/Family have Issues with Lodging/Transportation while Pt is in Rehab?: No  Pt's Mom very involved , but pt directs his own care and informs her.  Pt goals for Mod I to supervision only short distances. His ortho surgeons have told him that it was mor important for strengthening his transfers and losing weight so that he can eventually have knee surgeries. Pt also looking into gastric bypass surgery. He has lost 60 lbs since Jan. 2015.  Pt wants surgeon outpt appt to be arranged to assess for gastric bypass/banding.  Goals/Additional Needs  Patient/Family Goal for Rehab: Mod I to S PT, Mod I to Min OT  Expected length of stay: ELOS 8 to 13 days  Equipment Needs: Bariatric Equipment   Special Service Needs: pt highly anxious and at times gets claustrophobic. Bew to BiPap this admission. Had planned at home sleep study through Dr. Halford Chessman pta  Pt/Family Agrees to Admission and willing to participate: Yes  Program Orientation Provided & Reviewed with Pt/Caregiver Including Roles & Responsibilities: Yes  Decrease burden of Care through IP rehab admission: n/a  Possible need for SNF placement upon discharge: no  Patient Condition: This patient's medical and functional status has changed since the consult dated: 12/15/2013 in which the Rehabilitation Physician determined and documented that the patient's condition is appropriate for intensive rehabilitative care in an inpatient rehabilitation facility. See "History of Present Illness" (above) for medical update. Functional changes are: overall min to mod assist. Patient's medical and functional status update has been discussed with the Rehabilitation physician and patient remains appropriate for inpatient rehabilitation. Will admit to inpatient rehab today.  Preadmission Screen Completed By: Cleatrice Burke, 12/19/2013 1:55 PM  ______________________________________________________________________  Discussed status with Dr. Naaman Plummer on 12/19/2013 at 1356 and received telephone approval for admission today.  Admission Coordinator: Cleatrice Burke, time 5516 Date 12/19/2013  Cosigned by: Meredith Staggers, MD [12/19/2013 2:12 PM]

## 2013-12-22 NOTE — Consult Note (Signed)
WOC wound follow up Wound type: surgical wound with complex NPWT VAC dressing. Wound bed: clean with mostly granular tissue, base visualized  Drainage (amount, consistency, odor) moderate amount in canister with odor but not a change. Changed canister today  Periwound:intact with most of the denudation improved. Still one area from 7-10 o'clock, appears irritated from the drainage.   Dressing procedure/placement/frequency: Foam dressing injected with 10cc lidocaine prior to dressing removal Cleansed all the periwound well and dried.  Treated the denuded skin with antifungal powder and crusted skin with no sting skin prep.  Cleansed wound well with wound cleanser.  One long strip of black granufoam used to fill the tunneled area and then to fill the reminder of the wound bed.  Bridge to the right of the wound with one pc of black foam, drape under bridge to protect skin.  Seal at 142mmHG, pt tolerated well.   WOC will plan to change [facility] dressing M/W/F Tashauna Caisse Liane Comber RN,CWOCN [phone]

## 2013-12-22 NOTE — Progress Notes (Signed)
Physical Medicine and Rehabilitation Consult  Reason for Consult: Pituitary mass with diplopia, right ptosis, HA and weakness  Referring Physician: Dr. Thereasa Solo  HPI: [name] [name] is a 48 y.o. male with history of HTN, OSA, diastolic CHF, morbid obesity, chronic abdominal wound with recent abdominal wall abscess s/p I and D 06/06/13 (with prolonged hospitalization and CIR stay 08/2013 in [place]) who was admitted on 12/06/13 with 2 day history of HA, blurry vision with right ptosis, hematemesis, and hypercarbic respiratory failure. He was transferred from [facility] to [facility] for management as CT head revealed prominent, large sellar & suprasellar mass lesion with extension into the R cavernous sinus, ssuperior extension beyond the diaphragm sella. Osseous destruction with extension into sphenoid sinus. He was placed on BIPAP and clear liquid diet. Dr. Doy Mince consulted for input and labs done revealing ACTH 135, FH <0.1, FSH < 0.3, TSH 0.137, Free T4- 0.81 and Cortisol level 31.2.  Dr. Joya Salm and [facility] consulted for input on surgical intervention. Dr. Tommy Medal and Dr. Georgette Dover consulted for input on chronic abdominal wound and recommended continuing fluconazole and no need for antibiotics as no signs of infection noted. To continue with VAC and cleared to proceed with surgery per [facility] input. He has had issues with anxiety requiring BIPAP for labored breathing. On 12/10/13, he underwent transphenoidal resection of pituitary tumor by Dr Radene Journey and Dr. Joya Salm. He was extubated on 07/09 and cleared for regular diet. Last cortisol level remains elevated at 21.1 and Endocrine follow up recommended for input. Patient continues to be limited by diplopia, generalized weakness, pain as well as body habitus. Therapy initiated and CIR recommended by rehab team.  Patient was independent for stand pivot transfer PTA. Was in process of getting motorized WC due to ortho (Drs Murphy/Olin) recommendations to limit mobility to  transfers only for now secondary to concerns of fall/fracture. He was in process of losing weight in order to have B-TKR in the futures and has lost 60 lbs in past 6 months.  Review of Systems  Constitutional: Positive for malaise/fatigue.  HENT: Negative for hearing loss and tinnitus.  Eyes: Positive for blurred vision and double vision.  Respiratory: Positive for shortness of breath (due to nasal packing). Negative for cough and sputum production.  Cardiovascular: Negative for chest pain and palpitations.  Gastrointestinal: Positive for constipation. Negative for heartburn and nausea.  Genitourinary: Positive for urgency and frequency.  Musculoskeletal: Positive for joint pain.  Neurological: Positive for weakness and headaches. Negative for dizziness and tingling.  Psychiatric/Behavioral: Negative for depression and memory loss. The patient is nervous/anxious.   Past Medical History   Diagnosis  Date   .  Sleep apnea    .  Allergic rhinitis    .  Diastolic CHF    .  Gout    .  Nephrolithiasis    .  Peripheral neuropathy    .  Hypertension     Past Surgical History   Procedure  Laterality  Date   .  Sigmoid colonoscopy     .  Hernia repair     .  Abdominal surgery       for cyst with MRSA   .  Craniotomy  N/A  12/10/2013     Procedure: Transpheniodal resection of Pituitary tumor with Dr. Radene Journey for approach; Surgeon: Floyce Stakes, MD; Location: [facility] NEURO ORS; Service: Neurosurgery; Laterality: N/A; Transpheniodal resection of Pituitary tumor with Dr. Radene Journey for approach   .  Transnasal approach  N/A  12/10/2013     Procedure: TRANSNASAL APPROACH; Surgeon: Rozetta Nunnery, MD; Location: [facility] NEURO ORS; Service: ENT; Laterality: N/A;    Family History   Problem  Relation  Age of Onset   .  Hypertension  Mother    .  Hypertension  Father    .  Gout  Father    .  Hypothyroidism  Father    .  Gout  Brother     Social History: Lives with parents. Moved from [place] 4  months ago--lost his job due to current illness. He was independent for ADLs and transfers with walker PTA. He reports that he has never smoked. He has never used smokeless tobacco. He reports that he does not drink alcohol or use illicit drugs.  Allergies   Allergen  Reactions   .  Penicillins  Anaphylaxis and Other (See Comments)     Unknown reaction   .  Morphine And Related  Hives and Itching    Medications Prior to Admission   Medication  Sig  Dispense  Refill   .  albuterol (PROAIR HFA) 108 (90 BASE) MCG/ACT inhaler  Inhale 1-2 puffs into the lungs every 6 (six) hours as needed for wheezing or shortness of breath.  1 Inhaler  5   .  allopurinol (ZYLOPRIM) 300 MG tablet  Take 300 mg by mouth daily.     Marland Kitchen  ALPRAZolam (XANAX) 0.25 MG tablet  Take 0.25 mg by mouth daily as needed for anxiety.     .  carvedilol (COREG) 25 MG tablet  Take 25 mg by mouth 2 (two) times daily with a meal.     .  citalopram (CELEXA) 40 MG tablet  Take 40 mg by mouth daily.     .  cyclobenzaprine (FLEXERIL) 10 MG tablet  Take 10 mg by mouth daily as needed for muscle spasms.     .  Diclofenac-Misoprostol (ARTHROTEC) 75-0.2 MG TBEC  Take 1 tablet by mouth 2 (two) times daily.     .  fluticasone (FLONASE) 50 MCG/ACT nasal spray  Place 2 sprays into both nostrils daily as needed for allergies or rhinitis.     .  Gabapentin, PHN, (GRALISE) 300 MG TABS  Take 900 mg by mouth 3 (three) times daily.     Marland Kitchen  HYDROcodone-acetaminophen (NORCO/VICODIN) 5-325 MG per tablet  Take 1-2 tablets by mouth every 6 (six) hours as needed for moderate pain.     Marland Kitchen  loratadine (CLARITIN) 10 MG tablet  Take 10 mg by mouth daily as needed for allergies.     .  Multiple Vitamin (MULTIVITAMIN) tablet  Take 1 tablet by mouth daily. Centrum brand     .  Nutritional Supplements (JUVEN PO)  Take 1 packet by mouth 2 (two) times daily. Wound healing     .  potassium chloride (K-DUR,KLOR-CON) 10 MEQ tablet  Take 10 mEq by mouth 2 (two) times daily.      .  Prenatal Vit-Fe Fumarate-FA (PRENATAL PO)  Take 1 tablet by mouth daily. For wound healing     .  Probiotic Product (PROBIOTIC DAILY) CAPS  Take 1 capsule by mouth daily.     .  pseudoephedrine (SUDAFED) 120 MG 12 hr tablet  Take 120 mg by mouth daily as needed for congestion.     .  vitamin C (ASCORBIC ACID) 500 MG tablet  Take 500 mg by mouth daily.      Home:  Home Living  Family/patient expects  to be discharged to:: Private residence  Living Arrangements: Parent  Available Help at Discharge: Family;Available 24 hours/day  Type of Home: House  Home Access: Stairs to enter  [employer] of Steps: 6  Entrance Stairs-Rails: Right  Home Layout: One level  Home Equipment: Walker - 2 wheels;Wheelchair - [employer];Hospital bed (working with MD on trying to get power chair)  Additional Comments: Has ordered powerred w/c  Functional History:  Prior Function  Level of Independence: Independent with assistive device(s)  Comments: ; currently has wound vac R lateral lower abdomen. States he has had vac since January.; wound carea nurse comes out to house 2 days/week. therapists hadjust D/C ed pt.  Functional Status:  Mobility:  Bed Mobility  Overal bed mobility: Needs Assistance  Bed Mobility: Rolling;Sidelying to Sit  Rolling: Min assist;+2 for physical assistance;+2 for safety/equipment  Sidelying to sit: Min assist  General bed mobility comments: Assist to help rotate trunk and hips to EOB  Transfers  Overall transfer level: Needs assistance  Equipment used: Rolling walker (2 wheeled)  Transfers: Sit to/from Stand  Sit to Stand: Mod assist;+2 physical assistance;+2 safety/equipment  Stand pivot transfers: Mod assist;+2 physical assistance;+2 safety/equipment  General transfer comment: assist to power up to standing, Cues to breathe and come to upright posture and position  Ambulation/Gait  Ambulation/Gait assistance: Mod assist;+2 physical assistance;+2  safety/equipment  Ambulation Distance (Feet): 4 Feet  Assistive device: Rolling walker (2 wheeled)  Gait Pattern/deviations: Step-to pattern;Shuffle  General Gait Details: pivotal steps to chair   ADL:  ADL  Overall ADL's : Needs assistance/impaired  Eating/Feeding: Modified independent (liquid diet)  Grooming: Set up;Sitting  Upper Body Bathing: Minimal assitance;Sitting  Lower Body Bathing: Maximal assistance;Sit to/from stand  Upper Body Dressing : Moderate assistance;Sitting  Lower Body Dressing: Sit to/from stand;Total assistance (without AE)  Toilet Transfer: +2 for physical assistance;Moderate assistance (simulated to recliner)  Toileting- Clothing Manipulation and Hygiene: Total assistance  Toileting - Clothing Manipulation Details (indicate cue type and reason): foley  Functional mobility during ADLs: +2 for physical assistance;Moderate assistance;Rolling walker (bariatric)  General ADL Comments: At baseline pt uses AE for LB ADL (reacher and sock aid). Pt states he has ordered a reacher. Discussed benefits of using a toilet aid.  Cognition:  Cognition  Overall Cognitive Status: No family/caregiver present to determine baseline cognitive functioning  Orientation Level: Oriented X4  Cognition  Arousal/Alertness: Awake/alert  Behavior During Therapy: WFL for tasks assessed/performed  Overall Cognitive Status: No family/caregiver present to determine baseline cognitive functioning  Memory: Decreased short-term memory (some difficulty recalling vision exercises from yesterday)  Blood pressure 131/89, pulse 70, temperature 97.8 F (36.6 C), temperature source Oral, resp. rate 21, height 5\' 11"  (1.803 m), weight 210 kg (462 lb 15.5 oz), SpO2 95.00%.  Physical Exam  Nursing note and vitals reviewed.  Constitutional: He is oriented to person, place, and time. He appears well-developed and well-nourished.  Morbidly obese.  HENT:  Head: Normocephalic and atraumatic.  Eyes:  Conjunctivae are normal. Pupils are equal, round, and reactive to light.  Tends to keep right eye closed  Neck: Normal range of motion. Neck supple.  Cardiovascular: Normal rate and regular rhythm.  No murmur heard.  Respiratory: Effort normal. No respiratory distress. He has wheezes.  GI: Soft. Bowel sounds are normal. He exhibits no distension. There is no tenderness.  Obese with VAC in place.  Musculoskeletal: He exhibits no edema.  Swelling about both knees.  Neurological: He is alert and oriented to person,  place, and time.  Delayed response  Skin: Skin is warm and dry.  Psychiatric: He has a normal mood and affect. His behavior is normal. Judgment and thought content normal.   Results for orders placed during the hospital encounter of 12/06/13 (from the past 24 hour(s))   HEMOGLOBIN A1C Status: Abnormal    Collection Time    12/15/13 7:33 AM   Result  Value  Ref Range    Hemoglobin A1C  5.8 (*)  <5.7 %    Mean Plasma Glucose  120 (*)  <117 mg/dL   BASIC METABOLIC PANEL Status: Abnormal    Collection Time    12/15/13 7:34 AM   Result  Value  Ref Range    Sodium  143  137 - 147 mEq/L    Potassium  3.8  3.7 - 5.3 mEq/L    Chloride  99  96 - 112 mEq/L    CO2  34 (*)  19 - 32 mEq/L    Glucose, Bld  104 (*)  70 - 99 mg/dL    BUN  18  6 - 23 mg/dL    Creatinine, Ser  0.81  0.50 - 1.35 mg/dL    Calcium  8.7  8.4 - 10.5 mg/dL    GFR calc non Af Amer  >90  >90 mL/min    GFR calc Af Amer  >90  >90 mL/min    Anion gap  10  5 - 15   CBC Status: Abnormal    Collection Time    12/15/13 8:32 AM   Result  Value  Ref Range    WBC  6.5  4.0 - 10.5 K/uL    RBC  4.80  4.22 - 5.81 MIL/uL    Hemoglobin  12.9 (*)  13.0 - 17.0 g/dL    HCT  42.8  39.0 - 52.0 %    MCV  89.2  78.0 - 100.0 fL    MCH  26.9  26.0 - 34.0 pg    MCHC  30.1  30.0 - 36.0 g/dL    RDW  18.2 (*)  11.5 - 15.5 %    Platelets  128 (*)  150 - 400 K/uL    No results found.  Assessment/Plan:  Diagnosis: deconditioning  after multiple medical, pituitary tumor resection  1. Does the need for close, 24 hr/day medical supervision in concert with the patient's rehab needs make it unreasonable for this patient to be served in a less intensive setting? Yes 2. Co-Morbidities requiring supervision/potential complications: morbid obesity, endstage OA of both knees 3. Due to bladder management, bowel management, safety, skin/wound care, disease management, medication administration, pain management and patient education, does the patient require 24 hr/day rehab nursing? Yes 4. Does the patient require coordinated care of a physician, rehab nurse, PT (1-2 hrs/day, 5 days/week) and OT (1-2 hrs/day, 5 days/week) to address physical and functional deficits in the context of the above medical diagnosis(es)? Yes Addressing deficits in the following areas: balance, endurance, locomotion, strength, transferring, bowel/bladder control, bathing, dressing, feeding, grooming, toileting and psychosocial support 5. Can the patient actively participate in an intensive therapy program of at least 3 hrs of therapy per day at least 5 days per week? Yes 6. The potential for patient to make measurable gains while on inpatient rehab is excellent 7. Anticipated functional outcomes upon discharge from inpatient rehab are modified independent and supervision with PT, modified independent and min assist with OT, n/a with SLP. 8. Estimated rehab length of stay  to reach the above functional goals is: 8-13 days 9. Does the patient have adequate social supports to accommodate these discharge functional goals? Yes 10. Anticipated D/C setting: Home 11. Anticipated post D/C treatments: HH therapy and Outpatient therapy 12. Overall Rehab/Functional Prognosis: excellent RECOMMENDATIONS:  This patient's condition is appropriate for continued rehabilitative care in the following setting: CIR  Patient has agreed to participate in recommended program. Yes  Note  that insurance prior authorization may be required for reimbursement for recommended care.  Comment: Rehab Admissions Coordinator to follow up.  Thanks,  Meredith Staggers, MD, Mellody Drown  12/15/2013

## 2013-12-22 NOTE — Progress Notes (Signed)
Occupational Therapy Session Note  Patient Details  Name: [name] [name] MRN: [id number] Date of Birth: 1965/12/30  Today's Date: 12/22/2013 Time: 0810 (therapy session start time delayed from nursing care)-0910 Time Calculation (min): 60 min  Short Term Goals: Week 1:  OT Short Term Goal 1 (Week 1): STG=LTG  Skilled Therapeutic Interventions/Progress Updates:    Pt seen for BADL retraining of bathing and dressing with a focus on activity tolerance and functional mobility. Discussed his current home set up, equipment needs and goals. Pt stated that due to his severe OA he is not able to attempt ambulation of more than a few feet but he would like to be able to take 6-8 steps into his bathroom at home. He does have a bariatric BSC and a bariatric tub bench he likes at home. Pt was able to sit to EOB without A to complete most of his bathing. To wash his perineal area and buttocks he leaned back on the bed. He was not able to get to his feet. The mattress is an air mattress overlay and it was very difficult for pt to come into a stand as he did not have a firm surface to push from. He was able to come into a stand but was too fearful to release his grip from side rail to walker. Spoke with nursing about changing mattress as pt is mobile in the bed and does not have any skin issues.  Pt slid back into bed to pull pants over hips.  He needs cues to breathe during exertion. Good participation and motivation from pt. Pt set up in bed with breakfast tray and call light.   Therapy Documentation Precautions:  Precautions Precautions: Fall Precaution Comments: wound vac Restrictions Weight Bearing Restrictions: No   Pain: Pain Assessment Pain Assessment: 0-10 Pain Score: 5  Pain Type: Acute pain Pain Location: Head Pain Descriptors / Indicators: Headache Pain Intervention(s): Medication (See eMAR) ADL:  See FIM for current functional status  Therapy/Group: Individual  Therapy  [place] 12/22/2013, 11:20 AM

## 2013-12-23 ENCOUNTER — Inpatient Hospital Stay (HOSPITAL_COMMUNITY): Payer: BC Managed Care – PPO

## 2013-12-23 ENCOUNTER — Encounter (HOSPITAL_COMMUNITY): Payer: Self-pay | Admitting: Neurosurgery

## 2013-12-23 ENCOUNTER — Inpatient Hospital Stay (HOSPITAL_COMMUNITY): Payer: BC Managed Care – PPO | Admitting: *Deleted

## 2013-12-23 ENCOUNTER — Encounter (HOSPITAL_COMMUNITY): Payer: BC Managed Care – PPO

## 2013-12-23 DIAGNOSIS — D496 Neoplasm of unspecified behavior of brain: Secondary | ICD-10-CM

## 2013-12-23 DIAGNOSIS — R5381 Other malaise: Secondary | ICD-10-CM

## 2013-12-23 DIAGNOSIS — I5033 Acute on chronic diastolic (congestive) heart failure: Secondary | ICD-10-CM

## 2013-12-23 LAB — BASIC METABOLIC PANEL
Anion gap: 12 (ref 5–15)
BUN: 26 mg/dL — ABNORMAL HIGH (ref 6–23)
CO2: 30 mEq/L (ref 19–32)
Calcium: 8.9 mg/dL (ref 8.4–10.5)
Chloride: 99 mEq/L (ref 96–112)
Creatinine, Ser: 0.8 mg/dL (ref 0.50–1.35)
GFR calc Af Amer: >90 mL/min (ref >90)
GFR calc non Af Amer: >90 mL/min (ref >90)
Glucose, Bld: 92 mg/dL (ref 70–99)
Potassium: 3.9 mEq/L (ref 3.7–5.3)
Sodium: 141 mEq/L (ref 137–147)

## 2013-12-23 LAB — MAGNESIUM: Magnesium: 2.3 mg/dL (ref 1.5–2.5)

## 2013-12-23 MED ORDER — DICLOFENAC-MISOPROSTOL 75-0.2 MG PO TBEC
1.0000 | DELAYED_RELEASE_TABLET | Freq: Two times a day (BID) | ORAL | Status: DC
Start: 2013-12-23 — End: 2014-01-01
  Administered 2013-12-23 – 2014-01-01 (×18): 1 via ORAL
  Filled 2013-12-23 (×8): qty 1

## 2013-12-23 MED ORDER — OXYCODONE HCL ER 10 MG PO T12A
20.0000 mg | EXTENDED_RELEASE_TABLET | Freq: Two times a day (BID) | ORAL | Status: DC
  Administered 2013-12-23 – 2014-01-01 (×18): 20 mg via ORAL
  Filled 2013-12-23 (×18): qty 2

## 2013-12-23 MED ORDER — NON FORMULARY: 75.0000 mg | Freq: Two times a day (BID) | Status: DC

## 2013-12-23 NOTE — Progress Notes (Signed)
Pt requested chaplain to come and pray for him and give him a listening ear,as he recently discovered that he has been diagnosed with cancer. Chaplain gave pt a listening ear and recommended he listen to soothing music when he became upset and the bible on audio. Chaplin prayed for pt. Pt was in a much better mood when chaplain left.  Charyl Dancer, Chaplain  12/23/13 1600  Clinical Encounter Type  Visited With Patient  Visit Type Spiritual support  Referral From Patient  Consult/Referral To Chaplain  Recommendations listern to calming music and bible  Spiritual Encounters  Spiritual Needs Brochure;Prayer  Stress Factors  Patient Stress Factors Loss of control;Major life changes  Family Stress Factors None identified

## 2013-12-23 NOTE — Progress Notes (Signed)
[name] PHYSICAL MEDICINE & REHABILITATION     PROGRESS NOTE    Subjective/Complaints:   Objective: Vital Signs: Blood pressure 122/82, pulse 73, temperature 97.8 F (36.6 C), temperature source Oral, resp. rate 20, weight 205.9 kg (453 lb 14.8 oz), SpO2 98.00%. No results found. No results found for this basename: WBC, HGB, HCT, PLT,  in the last 72 hours  Recent Labs  12/22/13 0500 12/23/13 0445  NA 142 141  K 3.8 3.9  CL 101 99  GLUCOSE 95 92  BUN 23 26*  CREATININE 0.88 0.80  CALCIUM 8.8 8.9   CBG (last 3)  No results found for this basename: GLUCAP,  in the last 72 hours  Wt Readings from Last 3 Encounters:  12/23/13 205.9 kg (453 lb 14.8 oz)  12/20/13 199 kg (438 lb 11.5 oz)  12/20/13 199 kg (438 lb 11.5 oz)    Physical Exam:  Morbidly obese.  HENT:  Head: Normocephalic and atraumatic.  Eyes: Conjunctivae are normal. Pupils are equal, round, and reactive to light.  Tends to keep right eye closed  Neck: Normal range of motion. Neck supple.  Cardiovascular: Normal rate and regular rhythm.  No murmur heard.  Respiratory: Effort normal. No respiratory distress. He has wheezes.  GI: Soft. Bowel sounds are normal. He exhibits no distension. There is no tenderness.  Obese with VAC in place. Area red around dressing site/opsite Musculoskeletal: He exhibits no edema.  Swelling about both knees. Tender with ROM still Neurological: He is alert and oriented to person, place, and time.  Reasonable insight and awareness.  Skin: Skin is warm and dry.  Psychiatric: anxious   Assessment/Plan: 1. Functional deficits secondary to deconditioning related to multiple medical issues which require 3+ hours per day of interdisciplinary therapy in a comprehensive inpatient rehab setting. Physiatrist is providing close team supervision and 24 hour management of active medical problems listed below. Physiatrist and rehab team continue to assess barriers to discharge/monitor  patient progress toward functional and medical goals. FIM: FIM - Bathing Bathing Steps Patient Completed: Chest;Right Arm;Left Arm;Abdomen;Right upper leg;Left upper leg;Front perineal area;Buttocks Bathing: 4: Min-Patient completes 8-9 54f 10 parts or 75+ percent  FIM - Upper Body Dressing/Undressing Upper body dressing/undressing steps patient completed: Thread/unthread right sleeve of pullover shirt/dresss;Thread/unthread left sleeve of pullover shirt/dress;Put head through opening of pull over shirt/dress;Pull shirt over trunk Upper body dressing/undressing: 5: Set-up assist to: Obtain clothing/put away FIM - Lower Body Dressing/Undressing Lower body dressing/undressing: 1: Total-Patient completed less than 25% of tasks  FIM - Toileting Toileting steps completed by patient: Performs perineal hygiene Toileting Assistive Devices: Grab bar or rail for support Toileting: 0: Activity did not occur  FIM - Radio producer Devices: Nurse, learning disability Transfers: 0-Activity did not occur  FIM - Control and instrumentation engineer Devices: Arm rests;Walker;Bed rails Bed/Chair Transfer: 1: Two helpers;6: Sit > Supine: No assist  FIM - Locomotion: Wheelchair Locomotion: Wheelchair: 1: Total Assistance/staff pushes wheelchair (Pt<25%) FIM - Locomotion: Ambulation Locomotion: Ambulation Assistive Devices: Administrator Ambulation/Gait Assistance: 4: Min assist Locomotion: Ambulation: 1: Travels less than 50 ft with minimal assistance (Pt.>75%)  Comprehension Comprehension Mode: Auditory Comprehension: 7-Follows complex conversation/direction: With no assist  Expression Expression Mode: Verbal Expression: 7-Expresses complex ideas: With no assist  Social Interaction Social Interaction: 7-Interacts appropriately with others - No medications needed.  Problem Solving Problem Solving: 7-Solves complex problems: Recognizes &  self-corrects  Memory Memory: 6-More than reasonable amt of time Medical Problem List and  Plan:  1. Functional deficits secondary to deconditioning related to medical issues, pituitary tumor resection  2. DVT Prophylaxis/Anticoagulation: Pharmaceutical: Lovenox  3. Pain Management: robaxin, percocet, add dilaudid for more severe/acute pain  -add topamax for headaches  -arthrotec (use home supply) -add long acting morphine to better control knee pain 4. Mood: xanax prn, celexa, egosupport  5. Neuropsych: This patient is capable of making decisions on his own behalf.  6. Pituitary tumor: path pending.  7. Wound care---vac per woc----area stable  8. Enstage OA of bilateral knees--arthrotec  -pt followed by Dr. Alvan Dame. Recent xrays done  -was scheduled for synvisc. Will ask ortho to review options with patient---I don't suspect that they will want to pursue injections given his wound/medical status---probably will want to wait until he's home    LOS (Days) 3 A FACE TO FACE EVALUATION WAS PERFORMED  Lexxi Koslow T 12/23/2013 8:21 AM

## 2013-12-23 NOTE — Plan of Care (Signed)
Problem: RH Ambulation Goal: LTG Patient will ambulate in controlled environment (PT) LTG: Patient will ambulate in a controlled environment, # of feet with assistance (PT).  Decreased due to poor tolerance to standing from chronic pain in B knees Goal: LTG Patient will ambulate in home environment (PT) LTG: Patient will ambulate in home environment, # of feet with assistance (PT).  Decreased due to poor tolerance to standing from chronic pain in B knees

## 2013-12-23 NOTE — Progress Notes (Signed)
RT spoke with patient about wearing CPAP.  Patient states he would like to wear 2 lpm [place] at this time and be put on CPAP around midnight.

## 2013-12-23 NOTE — Progress Notes (Addendum)
Patient refused CPAP at this time but states he will call when ready.  Patient is currently on 2 lpm [name]. CPAP at bedside on standby.  RN aware.

## 2013-12-23 NOTE — Progress Notes (Signed)
Patient ID: [name] [name], male   DOB: [birth year], 48 y.o.   MRN: [id number] Path report pending. Neuro better. To get a f/u mri brain

## 2013-12-23 NOTE — Plan of Care (Signed)
Problem: RH PAIN MANAGEMENT Goal: RH STG PAIN MANAGED AT OR BELOW PT'S PAIN GOAL <3 on 0-10 scale with use of PO analgesics  Outcome: Not Progressing Rates pain as 9

## 2013-12-23 NOTE — Progress Notes (Signed)
Physical Therapy Session Note  Patient Details  Name: [name] [name] MRN: [id number] Date of Birth: 07/06/1965  Today's Date: 12/23/2013 Time: Treatment Session 1: 0810-0900; Treatment Session 2: 7564-3329 Time Calculation (min): Treatment Session 1: 50 min; Treatment Session 2: 87min  Short Term Goals: Week 1:  PT Short Term Goal 1 (Week 1): Pt will increase supine to edge of bed. edge of bed to supine with side rail and S.  PT Short Term Goal 2 (Week 1): Pt will increase transfer bed to chair, chair to bed with LRAD to S.  PT Short Term Goal 3 (Week 1): Pt will increase ambulation with LRAD to S about 15 feet.  PT Short Term Goal 4 (Week 1): Pt will increase w/c mobility to S about 25 to 50 feet.   Skilled Therapeutic Interventions/Progress Updates:  Treatment Session 1:  1:1. Pt received semi-reclined in bed, stating 9/10 headache but agreeable to participate as able. RN made aware and present to provide medication, missed 2min at start of session due to nursing needs. Extensive discussion regarding goals for physical therapy, previous level of functional mobility and DME needs. Pt verbalized that he had been working with his physician to obtain power w/c mobility PTA. Pt req min A for bed mobility w/ use of hospital bed functions and mod A for completion for t/f sit<>stand x3 from elevated bed. Pt req cues for breathing and relaxation techniques for increased tolerance to standing due to pain in B knees. Pt able to complete min A SPT bed>BSC for toilteing at end of session. OT present to take over.   Treatment Session 2:  1:1. Pt received semi-reclined in bed, ready for therapy. Pt req supervision for t/f sup>sit EOB w/ use of hospital bed techniques. Focus this session on seated therex and t/f sit<>stand for B LE strengthening. Min A for completion of SPT bed>w/c w/ bed height elevated. Pt w/ good tolerance to seated therex, exercises included 2x10 reps of: LAQ, marching, resisted hip abd  and add. Pt practiced t/f sit<>stand from w/c height 4x req mod A w/ emphasis on safety and technique. Therapist facilitating increased anterior weight shift, however, pt w/ poor tolerance to WB due to B knee pain. Pt unable to stand >10sec. Pt left sitting in w/c at end of session w/ all needs in reach.   Therapy Documentation Precautions:  Precautions Precautions: Fall Precaution Comments: wound vac Restrictions Weight Bearing Restrictions: No General: Amount of Missed PT Time (min): 10 Minutes Missed Time Reason: Nursing care   Pain: Pain Assessment Pain Score: 9  Pain Type: Acute pain  See FIM for current functional status  Therapy/Group: Individual Therapy  Gilmore Laroche 12/23/2013, 9:57 AM

## 2013-12-23 NOTE — Progress Notes (Signed)
Social Work  Social Work Assessment and Plan  Patient Details  Name: [name] [name] MRN: [id number] Date of Birth: June 13, 1965  Today's Date: 12/23/2013  Problem List:  Patient Active Problem List   Diagnosis Date Noted  . Physical deconditioning 12/20/2013  . 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 Gout 12/15/2013  . Clinical depression 12/15/2013  . Acute respiratory failure with hypercapnia 12/07/2013  . Morbid obesity 12/07/2013  . Neoplasm of brain causing mass effect on adjacent structures 12/06/2013  . 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 Wheezing 10/06/2013  . OSA (obstructive sleep apnea) 10/06/2013  . Injury of kidney 08/07/2013  . Acute on chronic diastolic heart failure 41/32/4401  . Obstructive apnea 08/07/2013  . Disorder of peripheral nervous system 08/07/2013  . 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 Extreme obesity 08/02/2013  . Chronic diastolic heart failure 02/72/5366  . Infected wound 07/28/2013  . 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 Elevated blood uric acid level 04/23/2013  . Abdominal wall abscess 12/27/2012  . Calculus of kidney 11/22/2012  . Kidney cysts 11/22/2012  . BP (high blood pressure) 05/21/2012  . H/O infectious disease 12/04/2011   Past Medical History:  Past Medical History  Diagnosis Date  . Sleep apnea   . Allergic rhinitis   . Diastolic CHF   . Gout   . Nephrolithiasis   . Peripheral neuropathy   . Hypertension    Past Surgical History:  Past Surgical History  Procedure Laterality Date  . Sigmoid colonoscopy    . Hernia repair    . Abdominal surgery      for cyst with MRSA  . Craniotomy N/A 12/10/2013    Procedure: Transpheniodal resection of Pituitary tumor with Dr. Radene Journey for approach;  Surgeon: Floyce Stakes, MD;  Location: [facility] NEURO ORS;  Service: Neurosurgery;  Laterality: N/A;  Transpheniodal resection of Pituitary tumor with Dr. Radene Journey for approach  . Transnasal approach N/A  12/10/2013    Procedure: TRANSNASAL APPROACH;  Surgeon: Rozetta Nunnery, MD;  Location: [facility] NEURO ORS;  Service: ENT;  Laterality: N/A;   Social History:  reports that he has never smoked. He has never used smokeless tobacco. He reports that he does not drink alcohol or use illicit drugs.  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 Transport chair to pt room to visit Caregiver Availability: 24/7 Family Dynamics: pt describes very close relationship with his family.  Very appreciative of all the help they have been providing to him since his return to [facility] in March.  Social History Preferred language: English Religion: Unknown Cultural Background: NA Education: college Read: Yes Write: Yes Employment Status: Unemployed Date Retired/Disabled/Unemployed: since Jan 2015 - initially on FMLA then terminated in March Legal Hisotry/Current Legal Issues: none Guardian/Conservator: none - per MD, pt capable of making decisions on his own behalf   Abuse/Neglect Physical Abuse: Denies Verbal Abuse: Denies Sexual Abuse: Denies Exploitation of patient/patient's resources: Denies Self-Neglect: Denies  Emotional Status Pt's affect, behavior adn adjustment status: Pt very pleasant, talkative and easily opens up about feeling "terrified" of what path report might be.  Reports having bouts of feeling "overwhelmed" as he waits for results.  Says he uses alot of self - talk to encourage himself to "  stay focused" on therapy.  Notes his family has been his primary support and "I know I will get through this but it's hard." Recent Psychosocial Issues: Pt describes almost a two year hx of  chronic health issues beginning with ruptured foot tendons.  Significant physical limitations which, ultimately, took him out of work and into long-term rehabiliation which continues now.  He states that, while he loves his family and appreciates their support, he was saddened to have to leave his home in [place] where he has been 20+ yrs. Pyschiatric History: Pt notes no significant psych/ mental health hx, however, did "get depressed" when he suffered foot injuries a couple of years ago and was significantly restricted in what he could do.  Notes his current anxiety has begun with finding of this tumor. Substance Abuse History: none  Patient / Family Perceptions, Expectations & Goals Pt/Family understanding of illness & functional limitations: Pt and family with very good understanding of his multiple medical issues and are anxiously awaiting path results that have been sent to [facility]. Good understanding of his functional limitations and need for CIR. Premorbid pt/family roles/activities: Pt was significantly limited at (parent's) home PTA using a walker and w/c for mobility.  Was independent overall. Anticipated changes in roles/activities/participation: Pt may require some increased assistance, however, goals have been set for Mod I. Pt/family expectations/goals: Pt's primary goals are to regain as much strength and independence as possible and secure a power w/c.  Community Resources Premorbid Home Care/DME Agencies: Other (Comment) ([facility] for [facility];  [facility] for wound VAC) Transportation available at discharge: yes - family assists, however, very difficult Resource referrals recommended: Neuropsychology;Support group (specify)  Discharge Planning Living Arrangements: Parent Support Systems: Parent;Other relatives Type of Residence: Private residence Insurance Resources: Multimedia programmer (specify) ([place]:  (LTD via employer ) Financial Screen Referred:  No Living Expenses: Education officer, museum (still owns home in [place] with plans to sell) Money Management: Patient Does the patient have any problems obtaining your medications?: No Home Management: pt and family Patient/Family Preliminary Plans: pt plans to return home with parents and longer term plans of buying home locally Barriers to Discharge:  (already ramped) Social Work Anticipated Follow Up Needs: HH/OP Expected length of stay: ELOS 8 to 13 days  Clinical Impression  Very pleasant, motivated gentleman here following discovery of pituitary tumor and anxiously awaiting path reports.  Good family support and he has been living with parents since March of 2015 due to prior medical issues.  Size may be significant barrier in reaching mod i goals.  Pt's primary wish is to be able to get power w/c for home to use until he has reached a weight that will allow for knee surgery.  Very open to neuropsych consult for assistance with coping - will arrange.  Will follow for support and d/c planning needs.  Calem Cocozza 12/23/2013, 11:01 AM

## 2013-12-23 NOTE — Progress Notes (Signed)
Occupational Therapy Session Note  Patient Details  Name: Jane Broughton MRN: [id number] Date of Birth: 08/26/1965  Today's Date: 12/23/2013  Session 1 Time: 0900-1000 Time Calculation (min): 60 min  Short Term Goals: Week 1:  OT Short Term Goal 1 (Week 1): STG=LTG  Skilled Therapeutic Interventions/Progress Updates:    Pt seated on BSC upon arrival.  Pt transferred BSC to EOB to complete bathing and dressing tasks.  Pt required max A for sit<>stand and steady A for stand-pivot transfer to EOB. Pt engaged in BADLs including bathing and dressing seated EOB.  Pt utilized lateral leans and posterior leans in bed to pull up pants after performing bathing tasks.  Pt lay back in bed at end of session.  Pt completed sit->supine with supervision.  Pt required multiple rest breaks during therapy session.  Focus on transfers, sit<>stand, bed mobility, sitting balance, activity tolerance, and safety awareness.   Therapy Documentation Precautions:  Precautions Precautions: Fall Precaution Comments: wound vac Restrictions Weight Bearing Restrictions: No   Pain: Pt denied pain See FIM for current functional status  Therapy/Group: Individual Therapy  Session 2 Time: 1130-1200 Pt denied pain Individual therapy  Pt engaged in BUE therex with theraband to increase BUE strength and endurance.  Focus on increased activity tolerance to facilitate independence with BADLs.   [name] [facility] 12/23/2013, 12:11 PM

## 2013-12-23 NOTE — Progress Notes (Signed)
Recreational Therapy Session Note  Patient Details  Name: [name] [name] MRN: [id number] Date of Birth: 02-05-1966 Today's Date: 12/23/2013  Pain: c/o pain, unrated-med being given by RN upon entry Skilled Therapeutic Interventions/Progress Updates: Session focused on identification of anxiety triggers, recognizing early symptoms of anxiety.  Also discussed & educated pt on diaphragmatic breathing techniques to assist with anxiety management as well as how to recognize if he is doing techniques appropriate.  Pt given verbal instruction & demonstration & then returned demonstration with supervision. [place] 12/23/2013, 12:40 PM

## 2013-12-23 NOTE — Progress Notes (Signed)
Placed patient on BIPAP 18/6 via full face mask with 2 lpm O2 bleed in. Patient tolerating well at this time.  RN aware.

## 2013-12-24 ENCOUNTER — Inpatient Hospital Stay (HOSPITAL_COMMUNITY): Payer: BC Managed Care – PPO

## 2013-12-24 ENCOUNTER — Encounter (HOSPITAL_COMMUNITY): Payer: BC Managed Care – PPO

## 2013-12-24 ENCOUNTER — Inpatient Hospital Stay (HOSPITAL_COMMUNITY): Payer: BC Managed Care – PPO | Admitting: *Deleted

## 2013-12-24 DIAGNOSIS — D496 Neoplasm of unspecified behavior of brain: Secondary | ICD-10-CM

## 2013-12-24 DIAGNOSIS — I5033 Acute on chronic diastolic (congestive) heart failure: Secondary | ICD-10-CM

## 2013-12-24 DIAGNOSIS — R5381 Other malaise: Secondary | ICD-10-CM

## 2013-12-24 LAB — BASIC METABOLIC PANEL
Anion gap: 9 (ref 5–15)
BUN: 27 mg/dL — ABNORMAL HIGH (ref 6–23)
CO2: 32 mEq/L (ref 19–32)
Calcium: 8.9 mg/dL (ref 8.4–10.5)
Chloride: 101 mEq/L (ref 96–112)
Creatinine, Ser: 0.9 mg/dL (ref 0.50–1.35)
GFR calc Af Amer: >90 mL/min (ref >90)
GFR calc non Af Amer: >90 mL/min (ref >90)
Glucose, Bld: 105 mg/dL — ABNORMAL HIGH (ref 70–99)
Potassium: 3.9 mEq/L (ref 3.7–5.3)
Sodium: 142 mEq/L (ref 137–147)

## 2013-12-24 LAB — MAGNESIUM: Magnesium: 2.3 mg/dL (ref 1.5–2.5)

## 2013-12-24 MED ORDER — GADOBENATE DIMEGLUMINE 529 MG/ML IV SOLN
15.0000 mL | Freq: Once | INTRAVENOUS | Status: AC
  Administered 2013-12-24: 15 mL via INTRAVENOUS

## 2013-12-24 MED ORDER — DIAZEPAM 5 MG PO TABS
5.0000 mg | ORAL_TABLET | Freq: Once | ORAL | Status: AC | PRN
  Administered 2013-12-24: 5 mg via ORAL
  Filled 2013-12-24: qty 1

## 2013-12-24 MED ORDER — TOPIRAMATE 25 MG PO TABS
50.0000 mg | ORAL_TABLET | Freq: Two times a day (BID) | ORAL | Status: DC
  Administered 2013-12-24 – 2014-01-01 (×16): 50 mg via ORAL
  Filled 2013-12-24 (×19): qty 2

## 2013-12-24 NOTE — Progress Notes (Signed)
Physical Therapy Session Note  Patient Details  Name: [name] [name] MRN: [id number] Date of Birth: [birth year]  Today's Date: 12/24/2013 Time: Treatment Session 1: 1100-1200; Treatment Session 2: 1515-1600 Time Calculation (min): Treatment Session 1: 60 min; Treatment Session 2: 24min  Short Term Goals: Week 1:  PT Short Term Goal 1 (Week 1): Pt will increase supine to edge of bed. edge of bed to supine with side rail and S.  PT Short Term Goal 2 (Week 1): Pt will increase transfer bed to chair, chair to bed with LRAD to S.  PT Short Term Goal 3 (Week 1): Pt will increase ambulation with LRAD to S about 15 feet.  PT Short Term Goal 4 (Week 1): Pt will increase w/c mobility to S about 25 to 50 feet.   Skilled Therapeutic Interventions/Progress Updates:  Treatment Session 1:  1:1. Pt received semi-reclined in bed, ready for therapy. Pt req (S) for t/f sup>sit EOB w/ use of hospital bed functions and min A for SPT bed>w/c. Pt demonstrated ability to propel manual w/c w/ B UE 18' on hard level surface and 15' on carpeted surface in day room. Extensive conversation with pt regarding power mobility options with consideration of PMH, current medical status as well as unknown medical status due to pending results form pathology report and anticipation of MRI later today. Pt educated as to why renting power mobility may be more appropriate at this time vs. Need for customized power w/c. Will continue to assess and discuss with pt's medical team for direction. Pt left sitting in w/c w/ all needs in reach.   Treatment Session 2:  Pt received semi-reclined in bed, working with Rec Therapist. Cotx at start of session w/ Rec Therapist for emphasis on management of stress anxiety. Focus remainder of session on bed mobility and seated therex. Emphasis on log roll technique for t/f sup<>sit EOB x3 w/ HOB flat and use of hospital bed to engage core with decreased stress on abdominal wound and for increased ease  of transfer. Pt engaged in ball toss/bounce/kick activity while sitting EOB to target gross endurance, challenged by speed of passes and alternated use of B UE/LE. Pt w/ good tolerance overall, difficulty level of activity assessed by pt's ability to maintain conversation. Pt left semi-reclined in bed w/ all needs in reach, RN in room.   Therapy Documentation Precautions:  Precautions Precautions: Fall Precaution Comments: wound vac Restrictions Weight Bearing Restrictions: No Pain: Pain Assessment Pain Assessment: 0-10 Pain Score: 5  Pain Type: Acute pain Pain Location: Head Pain Orientation: Posterior Pain Descriptors / Indicators: Headache Pain Onset: On-going Pain Intervention(s): Medication (See eMAR)  See FIM for current functional status  Therapy/Group: Individual Therapy  Gilmore Laroche 12/24/2013, 12:29 PM

## 2013-12-24 NOTE — Consult Note (Signed)
WOC wound follow up  Injected foam with 10cc Lidocaine  Wound type: surgical wound, non healing Measurement:2.0cm x 10cm x 8cm with tunnel at 2 o'clock 10cm  Wound MBE:MLJQG red, paler in the base, bleeds easily Drainage (amount, consistency, odor) moderate, serosanguineous  Periwound: intact however the denudation from 7-11 o'clock is still present and some worse today, skin it intact but very red. Dressing procedure/placement/frequency: cleansed wound thoroughly  with wound cleanser, cleansed the periwound with soap and water.  Some odor noted but after cleansing the odor diminished.  Filled tunnel with black foam and continuous strip of black foam used to fill the wound bed (1 pc).  One additional pc of black foam used at the wound edge to fill space.  Skin protected and bridge placed away from the wound with one additional pc of black foam. Total of 3 pc of black foam used. Seal at 110mmHG.  Pt tolerated without problems.   WOC will follow along with you for [facility] dressings M/W/F Maggy Wyble Liane Comber RN,CWOCN [phone]

## 2013-12-24 NOTE — Patient Care Conference (Signed)
Inpatient RehabilitationTeam Conference and Plan of Care Update Date: 12/23/2013   Time: 2:50 PM    Patient Name: [name] [name]      Medical Record Number: [id number]  Date of Birth: 1966-03-15 Sex: Male         Room/Bed: 4W12C/4W12C-01 Payor Info: Payor: [place] / Plan: BCBS PPO OUT OF STATE / Product Type: *No Product type* /    Admitting Diagnosis: pituitary tumor  Admit Date/Time:  12/20/2013  2:08 PM Admission Comments: No comment available   Primary Diagnosis:  <principal problem not specified> Principal Problem: <principal problem not specified>  Patient Active Problem List   Diagnosis Date Noted  . Physical deconditioning 12/20/2013  . 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 Gout 12/15/2013  . Clinical depression 12/15/2013  . Acute respiratory failure with hypercapnia 12/07/2013  . Morbid obesity 12/07/2013  . Neoplasm of brain causing mass effect on adjacent structures 12/06/2013  . 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 Wheezing 10/06/2013  . OSA (obstructive sleep apnea) 10/06/2013  . Injury of kidney 08/07/2013  . Acute on chronic diastolic heart failure 44/31/5400  . Obstructive apnea 08/07/2013  . Disorder of peripheral nervous system 08/07/2013  . 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 Extreme obesity 08/02/2013  . Chronic diastolic heart failure 86/76/1950  . Infected wound 07/28/2013  . 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 Elevated blood uric acid level 04/23/2013  . Abdominal wall abscess 12/27/2012  . Calculus of kidney 11/22/2012  . Kidney cysts 11/22/2012  . BP (high blood pressure) 05/21/2012  . H/O infectious disease 12/04/2011    Expected Discharge Date: Expected Discharge Date: 12/30/13  Team Members Present: Physician leading conference: Dr. Alger Simons Social Worker Present: Lennart Pall, LCSW Nurse Present: Elliot Cousin, RN PT Present: Otis Brace, PT;Bridgett Ripa, PT OT Present: Roanna Epley,  Griffin Basil, OT SLP Present: Weston Anna, SLP Other (Discipline and Name): Danne Baxter, RN [facility]) PPS Coordinator present : Daiva Nakayama, RN, CRRN;Becky Alwyn Ren, PT     Current Status/Progress Goal Weekly Team Focus  Medical   deconditioning after multiple medical issues, pituitary resection, including severe wounds, obese, severe OA of both knees  improve activity tolerance, reduce anxiety  pain control, skin care, anxiety, sleep,    Bowel/Bladder   Patient is continent of bowel and bladder. LBM: 12/21/2013  Patient to remain continent of bowel and bladder  Monitor for diarrhea/constipation   Swallow/Nutrition/ Hydration             ADL's   bathing/dressing-min A bed level; toilet transfers-mod A;  mod I overall  activity tolerance; transfers; safety awareness   Mobility   Min-mod A for bed mobility, standing, transfers  Mod(I)-(S)  funcitonal endurance, B LE strength, balance, safety, d/c planning, w/c evaluation   Communication             Safety/Cognition/ Behavioral Observations            Pain   Percocet 1-2 tabs q 4 hrs prn, Dilaudid 2 mg q 3 hrs prn. Dilaudid 2 mg at 0051 for headache rated 9/10  Pain managed at 3 or below  Monitor pain q 4 hrs; provide appropriate intervention....   Skin   Wound vac to RLQ. Drsg CDI. Reddened area under abdominal folds. Microguard powder applied. Rash to buttocks  No breakdown/infection during stay.Marland KitchenMarland KitchenMarland KitchenPatient to regain integrity of skin surface.  Educate patient/family on wound care, and measures to promote good skin integrity    Rehab Goals Patient on target to  meet rehab goals: Yes *See Care Plan and progress notes for long and short-term goals.  Barriers to Discharge: size, severe OA in knees    Possible Resolutions to Barriers:  pain mgt, ortho eval, ?injections, pacing, safety education    Discharge Planning/Teaching Needs:  home with parents who can only provide supervision      Team Discussion:  Very  insistent re: need for power w/c.  PT to co-treat with supervisor.  Anticipate only ambulating very short distance.  Family supportive, however, cannot provide physical assistance.   Revisions to Treatment Plan:  None   Continued Need for Acute Rehabilitation Level of Care: The patient requires daily medical management by a physician with specialized training in physical medicine and rehabilitation for the following conditions: Daily direction of a multidisciplinary physical rehabilitation program to ensure safe treatment while eliciting the highest outcome that is of practical value to the patient.: Yes Daily medical management of patient stability for increased activity during participation in an intensive rehabilitation regime.: Yes Daily analysis of laboratory values and/or radiology reports with any subsequent need for medication adjustment of medical intervention for : Post surgical problems;Neurological problems  [name] [name] 12/24/2013, 1:05 PM

## 2013-12-24 NOTE — Progress Notes (Signed)
Patient ID: [name] [name], male   DOB: [birth year], 48 y.o.   MRN: [id number] Stable. 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 Mri was seen. Gross debulking of the tumor with some left close to the clivus as we expected. Will call radiation oncologist for opinion

## 2013-12-24 NOTE — Progress Notes (Signed)
Recreational Therapy Session Note  Patient Details  Name: [name] [name] MRN: [id number] Date of Birth: April 13, 1966 Today's Date: 12/24/2013  Pain: no c/o Skilled Therapeutic Interventions/Progress Updates: Session focused on anxiety & stress management at bed level per pt request, pt with c/o fatigue.  Reviewed diaphragmatic breathing in which pt verbalized correct technique & independent use in the room last night.   [place] 12/24/2013, 5:42 PM

## 2013-12-24 NOTE — Progress Notes (Signed)
Occupational Therapy Session Note  Patient Details  Name: [name] [name] MRN: [id number] Date of Birth: 04/03/1966  Today's Date: 12/24/2013  Session 1 Time: 0300-9233 Time Calculation (min): 57 min  Short Term Goals: Week 1:  OT Short Term Goal 1 (Week 1): STG=LTG  Skilled Therapeutic Interventions/Progress Updates:    Pt engaged in bathing and dressing tasks seated EOB.  Focus on bed mobility, activity tolerance, AE use, sit<>stand, and safety awareness.  Pt requires min A for sit<>stand from elevated surface and mod/max A sit<>stand from lower surface.  Pt completes bathing and dressing tasks with min A/supervision with multiple rest breaks.  Therapy Documentation Precautions:  Precautions Precautions: Fall Precaution Comments: wound vac Restrictions Weight Bearing Restrictions: No   Pain: Pain Assessment Pain Assessment: 0-10 Pain Score: 2  Pain Type: Acute pain Pain Location: Head Pain Orientation: Posterior Pain Descriptors / Indicators: Headache Pain Frequency: Constant Pain Onset: On-going Patients Stated Pain Goal: 3 Pain Intervention(s): RN made aware  See FIM for current functional status  Therapy/Group: Individual Therapy  Session 2 Time: 0076-2263 Pt c/o 3/10 headache (dull sensation); RN aware Individual Therapy  Pt sitting in w/c upon arrival.  Pt initially engaged in sit<>stand X 3 with standing with BUE support (47 secs). Pt progressed to standing with unilateral UE suport for 1:15.  Pt attempted to stand without BUE support but unable.  Pt commented that he was too anxious when attempting to stand without support.  Pt performed stand-step-pivot to return to bed.  Pt required min A for sit<>stand and steady A for transfer and standing balance.  Focus on activity tolerance, sit<>stand, and standing balance to increased independence with BADLs.  [name] [facility] 12/24/2013, 9:59 AM

## 2013-12-24 NOTE — Progress Notes (Signed)
Social Work Patient ID: [name] [name], male   DOB: 03/10/1966, 48 y.o.   MRN: [id number]   Have reviewed team conference with pt who is aware and agreeable with targeted d/c date of 7/28.  His primary concern remains that he get a rental power w/c for d/c use.  Have spoken with several DME vendors and am working on finding a DME company that might have appropriately sized power chair that pt could rent for a few months.  Plans still to d/c home to parents.  Will continue to follow.  [name] Frith, LCSW

## 2013-12-24 NOTE — Progress Notes (Signed)
[place] PHYSICAL MEDICINE & REHABILITATION     PROGRESS NOTE    Subjective/Complaints:   Objective: Vital Signs: Blood pressure 139/84, pulse 86, temperature 97.7 F (36.5 C), temperature source Oral, resp. rate 18, weight 205.9 kg (453 lb 14.8 oz), SpO2 96.00%. No results found. No results found for this basename: WBC, HGB, HCT, PLT,  in the last 72 hours  Recent Labs  12/23/13 0445 12/24/13 0500  NA 141 142  K 3.9 3.9  CL 99 101  GLUCOSE 92 105*  BUN 26* 27*  CREATININE 0.80 0.90  CALCIUM 8.9 8.9   CBG (last 3)  No results found for this basename: GLUCAP,  in the last 72 hours  Wt Readings from Last 3 Encounters:  12/23/13 205.9 kg (453 lb 14.8 oz)  12/20/13 199 kg (438 lb 11.5 oz)  12/20/13 199 kg (438 lb 11.5 oz)    Physical Exam:  Morbidly obese.  HENT:  Head: Normocephalic and atraumatic.  Eyes: Conjunctivae are normal. Pupils are equal, round, and reactive to light.  Tends to keep right eye closed  Neck: Normal range of motion. Neck supple.  Cardiovascular: Normal rate and regular rhythm.  No murmur heard.  Respiratory: Effort normal. No respiratory distress. He has wheezes.  GI: Soft. Bowel sounds are normal. He exhibits no distension. There is no tenderness.  Obese with VAC in place. Area red around dressing site/opsite Musculoskeletal: He exhibits no edema.  Swelling about both knees. Tender with ROM still Neurological: He is alert and oriented to person, place, and time.  Reasonable insight and awareness.  Skin: Skin is warm and dry.  Psychiatric: anxious   Assessment/Plan: 1. Functional deficits secondary to deconditioning related to multiple medical issues which require 3+ hours per day of interdisciplinary therapy in a comprehensive inpatient rehab setting. Physiatrist is providing close team supervision and 24 hour management of active medical problems listed below. Physiatrist and rehab team continue to assess barriers to  discharge/monitor patient progress toward functional and medical goals.  Discussed mobility with patient. He wants a power chair as opposed to a scooter as the chair is easier to maneuver within his home. i suggested renting a chair as opposed to buying one. Stressed the need to maintain weight bearing and lower extremity strength especially if he wants to lose weight and wants future TKA's   FIM: FIM - Bathing Bathing Steps Patient Completed: Chest;Right Arm;Left Arm;Abdomen;Front perineal area;Buttocks;Right upper leg;Left upper leg Bathing: 4: Min-Patient completes 8-9 16f 10 parts or 75+ percent  FIM - Upper Body Dressing/Undressing Upper body dressing/undressing steps patient completed: Thread/unthread right sleeve of pullover shirt/dresss;Thread/unthread left sleeve of pullover shirt/dress;Put head through opening of pull over shirt/dress;Pull shirt over trunk Upper body dressing/undressing: 5: Set-up assist to: Obtain clothing/put away FIM - Lower Body Dressing/Undressing Lower body dressing/undressing steps patient completed: Thread/unthread right pants leg;Thread/unthread left pants leg;Pull pants up/down Lower body dressing/undressing: 2: Max-Patient completed 25-49% of tasks  FIM - Toileting Toileting steps completed by patient: Performs perineal hygiene Toileting Assistive Devices: Grab bar or rail for support Toileting: 2: Max-Patient completed 1 of 3 steps  FIM - Radio producer Devices: Nurse, learning disability Transfers: 4-To toilet/BSC: Min A (steadying Pt. > 75%)  FIM - Bed/Chair Transfer Bed/Chair Transfer Assistive Devices: HOB elevated;Bed rails;Arm rests;Walker Bed/Chair Transfer: 4: Supine > Sit: Min A (steadying Pt. > 75%/lift 1 leg);4: Bed > Chair or W/C: Min A (steadying Pt. > 75%);4: Chair or W/C > Bed: Min A (steadying Pt. >  75%)  FIM - Locomotion: Wheelchair Locomotion: Wheelchair: 1: Total Assistance/staff pushes wheelchair  (Pt<25%) FIM - Locomotion: Ambulation Locomotion: Ambulation Assistive Devices: Administrator Ambulation/Gait Assistance: 4: Min assist Locomotion: Ambulation: 1: Travels less than 50 ft with minimal assistance (Pt.>75%)  Comprehension Comprehension Mode: Auditory Comprehension: 7-Follows complex conversation/direction: With no assist  Expression Expression Mode: Verbal Expression: 7-Expresses complex ideas: With no assist  Social Interaction Social Interaction: 7-Interacts appropriately with others - No medications needed.  Problem Solving Problem Solving: 7-Solves complex problems: Recognizes & self-corrects  Memory Memory: 6-More than reasonable amt of time Medical Problem List and Plan:  1. Functional deficits secondary to deconditioning related to medical issues, pituitary tumor resection   -for MRI today  -path stil pending 2. DVT Prophylaxis/Anticoagulation: Pharmaceutical: Lovenox  3. Pain Management: robaxin, percocet, add dilaudid for more severe/acute pain  -added topamax for headaches which has helped --titrate to 50mg  bid -arthrotec (using home supply) -added long acting morphine to better control knee pain 4. Mood: xanax prn, celexa, egosupport  5. Neuropsych: This patient is capable of making decisions on his own behalf.  6. Pituitary tumor: path pending.  7. Wound care---vac per woc----area stable  8. Enstage OA of bilateral knees--arthrotec  -pt followed by Dr. Alvan Dame. Recent xrays done  -was scheduled for synvisc. Probably just needs to wait until he is home and through these medical issues   LOS (Days) 4 A FACE TO FACE EVALUATION WAS PERFORMED  Sarrinah Gardin T 12/24/2013 8:45 AM

## 2013-12-25 ENCOUNTER — Inpatient Hospital Stay (HOSPITAL_COMMUNITY): Payer: BC Managed Care – PPO

## 2013-12-25 ENCOUNTER — Inpatient Hospital Stay (HOSPITAL_COMMUNITY): Payer: BC Managed Care – PPO | Admitting: Physical Therapy

## 2013-12-25 ENCOUNTER — Encounter (HOSPITAL_COMMUNITY): Payer: Self-pay

## 2013-12-25 ENCOUNTER — Encounter (HOSPITAL_COMMUNITY): Payer: BC Managed Care – PPO

## 2013-12-25 LAB — BASIC METABOLIC PANEL
Anion gap: 11 (ref 5–15)
BUN: 28 mg/dL — ABNORMAL HIGH (ref 6–23)
CO2: 29 mEq/L (ref 19–32)
Calcium: 9 mg/dL (ref 8.4–10.5)
Chloride: 103 mEq/L (ref 96–112)
Creatinine, Ser: 0.95 mg/dL (ref 0.50–1.35)
GFR calc Af Amer: >90 mL/min (ref >90)
GFR calc non Af Amer: >90 mL/min (ref >90)
Glucose, Bld: 105 mg/dL — ABNORMAL HIGH (ref 70–99)
Potassium: 4.6 mEq/L (ref 3.7–5.3)
Sodium: 143 mEq/L (ref 137–147)

## 2013-12-25 LAB — MAGNESIUM: Magnesium: 2.4 mg/dL (ref 1.5–2.5)

## 2013-12-25 NOTE — Progress Notes (Signed)
Occupational Therapy Session Note  Patient Details  Name: [name] [name] MRN: [id number] Date of Birth: 1965/08/26  Today's Date: 12/25/2013  Session 1 Time: 0900-0958 Time Calculation (min): 58 min  Short Term Goals: Week 1:  OT Short Term Goal 1 (Week 1): STG=LTG  Skilled Therapeutic Interventions/Progress Updates:    Pt engaged in bathing and dressing EOB to address sitting balance, postural control, activity tolerance, bed mobility, sit<>stand, and static standing balance.  Pt completed bathing and dressing EOB and partially supine to pull up pants.  Pt performed sit<>stand X 3 (1'40", 55", 55") to complete shaving tasks with electric razor.  Pt requires min A for sit<>stand and standing balance while completing task.    Therapy Documentation Precautions:  Precautions Precautions: Fall Precaution Comments: wound vac Restrictions Weight Bearing Restrictions: No    Pain: Pain Assessment Pain Assessment: 0-10 Pain Score: 2  Pain Type: Acute pain Pain Location: Head Pain Descriptors / Indicators: Headache Patients Stated Pain Goal: 4 Pain Intervention(s):RN aware See FIM for current functional status  Therapy/Group: Individual Therapy  Session 2 Time: 8003-4917 Pt denied pain Individual therapy  Pt engaged in sit<>stand, dynamic standing balance with unilateral UE support while hitting beach ball to staff member, and BUE body weight therex to increase UE strength/shoulder strength and trunk mobility.  Pt stood X 3 (40", 30" X 2).  Pt required min A for sit<>stand and steady A while standing.  Focus on activity tolerance, standing balance, sit<>stand, and safety awareness.  [name] [facility] 12/25/2013, 9:59 AM

## 2013-12-25 NOTE — Progress Notes (Signed)
Physical Therapy Session Note  Patient Details  Name: [name] [name] MRN: [id number] Date of Birth: Sep 24, 1965  Today's Date: 12/25/2013 Time: 1100-1200 and 1445-1530 Time Calculation (min): 60 min and 45 min  Short Term Goals: Week 1:  PT Short Term Goal 1 (Week 1): Pt will increase supine to edge of bed. edge of bed to supine with side rail and S.  PT Short Term Goal 2 (Week 1): Pt will increase transfer bed to chair, chair to bed with LRAD to S.  PT Short Term Goal 3 (Week 1): Pt will increase ambulation with LRAD to S about 15 feet.  PT Short Term Goal 4 (Week 1): Pt will increase w/c mobility to S about 25 to 50 feet.   Skilled Therapeutic Interventions/Progress Updates:   AM Session: Pt received semi reclined in bed, reporting feeling much better since finding out good news that tumor is benign. Pt transferred supine > sit with use of bed functions and HOB slightly elevated and use of rail with log roll technique, supervision. Pt performed sit <> stand from EOB x 3 with static standing trials of 30-45 sec, therapist stabilizing bari RW. Stand pivot transfer bed > w/c using RW and min A, therapist managing wound vac. W/c mobility x 100 ft using BUEs, min A for propulsion due to difficulty reaching wheel rims. In therapy gym, pt performed seated B LE therex for strengthening, ROM, activity tolerance, 2 x 20 each exercise: LAQ, hip flexion, hip adduction, ankle pumps. Pt engaged in ball toss/kick activity from seated level to target endurance and coordination. Pt returned to room and left sitting up in w/c with all needs within reach.   PM Session: Pt received sitting up in w/c, reporting fatigue but in continued good spirits. W/c mobility 2 x 100 ft using BUEs, min A for propulsion. Pt performed UBE x 14 min total, 2-3 min forward/2-3 min backward for endurance and UE strengthening. Pt reports he has had UBE delivered to home and plans to use as exercise at d/c. Pt performed sit <> stand from  w/c with pulling up on // bars and min-mod A, unable to remain standing > 30sec and requesting to cease standing due to increased B knee pain. Pt propelled w/c in room with supervision to setup for w/c > bed transfer using RW with overall min A. Pt transferred sit > supine with HOB flat and supervision, able to reposition in bed independently. Pt left semi reclined in bed with all needs within reach.   Therapy Documentation Precautions:  Precautions Precautions: Fall Precaution Comments: wound vac Restrictions Weight Bearing Restrictions: No Pain: Pain Assessment Pain Assessment: 0-10 Pain Score: 2  Pain Type: Acute pain Pain Location: Head Pain Descriptors / Indicators: Headache Pain Intervention(s): Medication (See eMAR)  See FIM for current functional status  Therapy/Group: Individual Therapy  Laretta Alstrom 12/25/2013, 1:07 PM

## 2013-12-25 NOTE — Care Management Note (Signed)
[facility]patient Rehabilitation Center Individual Statement of Services  Patient Name:  [name] [name]  Date:  12/23/2013  Welcome to the [facility].  Our goal is to provide you with an individualized program based on your diagnosis and situation, designed to meet your specific needs.  With this comprehensive rehabilitation program, you will be expected to participate in at least 3 hours of rehabilitation therapies Monday-Friday, with modified therapy programming on the weekends.  Your rehabilitation program will include the following services:  Physical Therapy (PT), Occupational Therapy (OT), 24 hour per day rehabilitation nursing, Therapeutic Recreaction (TR), Neuropsychology, Case Management (Social Worker), Rehabilitation Medicine, Nutrition Services and Pharmacy Services  Weekly team conferences will be held on Tuesdays to discuss your progress.  Your Social Worker will talk with you frequently to get your input and to update you on team discussions.  Team conferences with you and your family in attendance may also be held.  Expected length of stay: 7-10 days  Overall anticipated outcome: supervision  Depending on your progress and recovery, your program may change. Your Social Worker will coordinate services and will keep you informed of any changes. Your Social Worker's name and contact numbers are listed  below.  The following services may also be recommended but are not provided by the [facility] will be made to provide these services after discharge if needed.  Arrangements include referral to agencies that provide these services.  Your insurance has been verified to be:  [employer] [place] Your primary doctor is:  Dr. Shelia Media  Pertinent information will be shared with your doctor and your insurance  company.  Social Worker:  [place], [place] or ([phone]   Information discussed with and copy given to patient by: Lennart Pall, 12/23/2013, 11:36 AM

## 2013-12-25 NOTE — Progress Notes (Addendum)
[place] PHYSICAL MEDICINE & REHABILITATION     PROGRESS NOTE    Subjective/Complaints: Happy that path was negative for tumor. Pain better controlled as a whole. Generally pleased with care.   Objective: Vital Signs: Blood pressure 126/62, pulse 84, temperature 97.3 F (36.3 C), temperature source Oral, resp. rate 17, weight 205 kg (451 lb 15.1 oz), SpO2 96.00%. [name]  12/24/2013   CLINICAL DATA:  Headache. Blurred vision. Reduced is. Previous pituitary tumor resection.  EXAM: MRI HEAD WITHOUT AND WITH CONTRAST  TECHNIQUE: Multiplanar, multiecho pulse sequences of the brain and surrounding structures were obtained without and with intravenous contrast.  CONTRAST:  57mL MULTIHANCE GADOBENATE DIMEGLUMINE 529 MG/ML IV SOLN  COMPARISON:  12/07/2013  FINDINGS: There is no acute or subacute infarction. The brainstem cerebellum are normal. Cerebral hemispheres are normal. No hydrocephalus or extra-axial collection. No evidence of intracranial hemorrhage.  The patient has had transsphenoidal surgery for debulking of the previously seen large an invasive sellar region mass. Most of the tissue the in the central sella in the tissue extending into the suprasellar region has been removed. There continues to be abnormal signal within the clivus consistent with bone invasion. 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 There is probably early invasion of the left cavernous sinus. Packing material is present in the sphenoid sinus.  IMPRESSION: Interval transsphenoidal surgery. Considerable debulking of the invasive mass with the epicenter in the region of the sella. Complete removal of the suprasellar component. 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   Electronically Signed   By: Nelson Chimes M.D.   On: 12/24/2013 18:04   No results found for this basename: WBC, HGB, HCT, PLT,  in the last  72 hours  Recent Labs  12/24/13 0500 12/25/13 0445  NA 142 143  K 3.9 4.6  CL 101 103  GLUCOSE 105* 105*  BUN 27* 28*  CREATININE 0.90 0.95  CALCIUM 8.9 9.0   CBG (last 3)  No results found for this basename: GLUCAP,  in the last 72 hours  Wt Readings from Last 3 Encounters:  12/25/13 205 kg (451 lb 15.1 oz)  12/20/13 199 kg (438 lb 11.5 oz)  12/20/13 199 kg (438 lb 11.5 oz)    Physical Exam:  Morbidly obese.  HENT:  Head: Normocephalic and atraumatic.  Eyes: Conjunctivae are normal. Pupils are equal, round, and reactive to light.   Neck: Normal range of motion. Neck supple.  Cardiovascular: Normal rate and regular rhythm.  No murmur heard.  Respiratory: Effort normal. No respiratory distress. He has wheezes.  GI: Soft. Bowel sounds are normal. He exhibits no distension. There is no tenderness.  Obese with VAC in place. Area remains red around dressing site/opsite Musculoskeletal: He exhibits no edema.  Swelling about both knees. Tender with ROM still Neurological: He is alert and oriented to person, place, and time.  Reasonable insight and awareness.  Skin: Skin is warm and dry.  Psychiatric: anxious   Assessment/Plan: 1. Functional deficits secondary to deconditioning related to multiple medical issues which require 3+ hours per day of interdisciplinary therapy in a comprehensive inpatient rehab setting. Physiatrist is providing close team supervision and 24 hour management of active medical problems listed below. Physiatrist and rehab team continue to assess barriers to discharge/monitor patient progress toward functional and medical goals.  Discussed mobility with patient. He wants a power chair as opposed  to a scooter as the chair is easier to maneuver within his home. i suggested renting a chair as opposed to buying one. Stressed the need to maintain weight bearing and lower extremity strength especially if he wants to lose weight and wants future  TKA's   FIM: FIM - Bathing Bathing Steps Patient Completed: Chest;Right Arm;Left Arm;Abdomen;Front perineal area;Buttocks;Right upper leg;Left upper leg Bathing: 4: Min-Patient completes 8-9 29f 10 parts or 75+ percent  FIM - Upper Body Dressing/Undressing Upper body dressing/undressing steps patient completed: Thread/unthread right sleeve of pullover shirt/dresss;Thread/unthread left sleeve of pullover shirt/dress;Put head through opening of pull over shirt/dress;Pull shirt over trunk Upper body dressing/undressing: 5: Set-up assist to: Obtain clothing/put away FIM - Lower Body Dressing/Undressing Lower body dressing/undressing steps patient completed: Thread/unthread right pants leg;Thread/unthread left pants leg;Pull pants up/down Lower body dressing/undressing: 2: Max-Patient completed 25-49% of tasks  FIM - Toileting Toileting steps completed by patient: Performs perineal hygiene Toileting Assistive Devices: Grab bar or rail for support Toileting: 2: Max-Patient completed 1 of 3 steps  FIM - Radio producer Devices: Nurse, learning disability Transfers: 4-To toilet/BSC: Min A (steadying Pt. > 75%)  FIM - Bed/Chair Transfer Bed/Chair Transfer Assistive Devices: Arm rests;Walker;Bed rails Bed/Chair Transfer: 5: Supine > Sit: Supervision (verbal cues/safety issues);5: Sit > Supine: Supervision (verbal cues/safety issues);4: Bed > Chair or W/C: Min A (steadying Pt. > 75%);4: Chair or W/C > Bed: Min A (steadying Pt. > 75%)  FIM - Locomotion: Wheelchair Locomotion: Wheelchair: 1: Travels less than 50 ft with supervision, cueing or coaxing FIM - Locomotion: Ambulation Locomotion: Ambulation Assistive Devices: Administrator Ambulation/Gait Assistance: 4: Min assist Locomotion: Ambulation: 0: Activity did not occur  Comprehension Comprehension Mode: Auditory Comprehension: 7-Follows complex conversation/direction: With no  assist  Expression Expression Mode: Verbal Expression: 7-Expresses complex ideas: With no assist  Social Interaction Social Interaction: 7-Interacts appropriately with others - No medications needed.  Problem Solving Problem Solving: 7-Solves complex problems: Recognizes & self-corrects  Memory Memory: 6-More than reasonable amt of time Medical Problem List and Plan:  1. Functional deficits secondary to deconditioning related to medical issues, pituitary tumor resection   -MRI with debulking---some residual---further follow up per NS  -tumor is NON-MALIGNANT 2. DVT Prophylaxis/Anticoagulation: Pharmaceutical: Lovenox  3. Pain Management: robaxin, percocet, add dilaudid for more severe/acute pain  -added topamax for headaches which has helped --titrated to 50mg  bid -arthrotec (using home supply) -added long acting morphine to better control knee pain which has helped 4. Mood: xanax prn, celexa, egosupport  5. Neuropsych: This patient is capable of making decisions on his own behalf.  6. Pituitary tumor: path pending.  7. Wound care---vac per woc----area stable   -continue diflucan for yeast on skin near vac site 8. Enstage OA of bilateral knees--arthrotec  -pt followed by Dr. Alvan Dame. Recent xrays done  -was scheduled for synvisc. Probably just needs to wait until he is home and through these medical issues   LOS (Days) 5 A FACE TO FACE EVALUATION WAS PERFORMED  SWARTZ,ZACHARY T 12/25/2013 8:39 AM

## 2013-12-25 NOTE — Plan of Care (Signed)
Problem: RH BLADDER ELIMINATION Goal: RH STG MANAGE BLADDER WITH ASSISTANCE STG Manage Bladder With Mod I  Outcome: Not Progressing Helper positions urinal and empties  Problem: RH PAIN MANAGEMENT Goal: RH STG PAIN MANAGED AT OR BELOW PT'S PAIN GOAL <3 on 0-10 scale with use of PO analgesics  Outcome: Not Progressing Pain does not decrease lower than a 4

## 2013-12-25 NOTE — Progress Notes (Signed)
Patient ID: [name] [name], male   DOB: 10/13/1965, 48 y.o.   MRN: [id number] Radiation oncology to see.neuro stable

## 2013-12-26 ENCOUNTER — Inpatient Hospital Stay (HOSPITAL_COMMUNITY): Payer: BC Managed Care – PPO

## 2013-12-26 ENCOUNTER — Ambulatory Visit (HOSPITAL_COMMUNITY): Payer: BC Managed Care – PPO

## 2013-12-26 ENCOUNTER — Encounter (HOSPITAL_COMMUNITY): Payer: BC Managed Care – PPO

## 2013-12-26 DIAGNOSIS — D496 Neoplasm of unspecified behavior of brain: Secondary | ICD-10-CM

## 2013-12-26 DIAGNOSIS — I5033 Acute on chronic diastolic (congestive) heart failure: Secondary | ICD-10-CM

## 2013-12-26 DIAGNOSIS — R5381 Other malaise: Secondary | ICD-10-CM

## 2013-12-26 LAB — BASIC METABOLIC PANEL
Anion gap: 10 (ref 5–15)
BUN: 30 mg/dL — ABNORMAL HIGH (ref 6–23)
CO2: 32 mEq/L (ref 19–32)
Calcium: 9.2 mg/dL (ref 8.4–10.5)
Chloride: 102 mEq/L (ref 96–112)
Creatinine, Ser: 1.06 mg/dL (ref 0.50–1.35)
GFR calc Af Amer: >90 mL/min (ref >90)
GFR calc non Af Amer: 81 mL/min — ABNORMAL LOW (ref >90)
Glucose, Bld: 108 mg/dL — ABNORMAL HIGH (ref 70–99)
Potassium: 3.9 mEq/L (ref 3.7–5.3)
Sodium: 144 mEq/L (ref 137–147)

## 2013-12-26 LAB — MAGNESIUM: Magnesium: 2.4 mg/dL (ref 1.5–2.5)

## 2013-12-26 NOTE — Progress Notes (Signed)
Occupational Therapy Session Note  Patient Details  Name: [name] [name] MRN: [id number] Date of Birth: 1965-12-30  Today's Date: 12/26/2013  Session 1 Time: 0900-1000 Time Calculation (min): 60 min  Short Term Goals: Week 1:  OT Short Term Goal 1 (Week 1): STG=LTG  Skilled Therapeutic Interventions/Progress Updates:    Pt engaged in BADL retraining including bathing and UB dressing seated EOB, BSC transfers, toileting, and LB dressing seated on BSC.  Pt requested use of BSC after bathing and UB dressing and performed stand pivot transfer with RW (bed->BSC) with close supervision for therapist to hold RW.  Pt able to perform toilet hygiene and thread pants without assistance.  Pt stood from [facility] to pull up pants before transferring back to bed.  Pt continues to require increased time to complete tasks and exhibits increased anxiety with transitional movements.   Therapy Documentation Precautions:  Precautions Precautions: Fall Precaution Comments: wound vac Restrictions Weight Bearing Restrictions: No General:   Vital Signs:   Pain: Pain Assessment Pain Assessment: 0-10 Pain Score: 9  Pain Type: Acute pain Pain Location: Leg Pain Orientation: Right;Left Pain Descriptors / Indicators: Aching Pain Frequency: Intermittent Pain Onset: Gradual Patients Stated Pain Goal: 4 Pain Intervention(s): Medication (See eMAR)  See FIM for current functional status  Therapy/Group: Individual Therapy  Session 2 Time: 1330-1400 Pt c/o 6/10 headache; RN aware Individual Therapy  Initial focus on bed mobility and transfer to w/c.  Pt completed all tasks at close supervision and extra time.  Pt engaged in bilateral BUE therex with 3# weight bar while seated in w/c (shoulder presses, biceps curls, trunk rotation). Pt remained in w/c with all needs in place.  [name] [facility] 12/26/2013, 11:02 AM

## 2013-12-26 NOTE — Plan of Care (Signed)
Problem: RH BOWEL ELIMINATION Goal: RH STG MANAGE BOWEL W/MEDICATION W/ASSISTANCE STG Manage Bowel with Medication with Mod I  Outcome: Progressing Patient declines Miralax at this time, voices awareness about preventing constipation  Problem: RH BLADDER ELIMINATION Goal: RH STG MANAGE BLADDER WITH ASSISTANCE STG Manage Bladder With Mod I  Outcome: Not Progressing Patient requires helper to obtain urinal , position, hold and then empty  Problem: RH PAIN MANAGEMENT Goal: RH STG PAIN MANAGED AT OR BELOW PT'S PAIN GOAL <3 on 0-10 scale with use of PO analgesics  Outcome: Not Progressing Pain remains between rating of 4 to 8 per patient.

## 2013-12-26 NOTE — Consult Note (Signed)
WOC wound follow up Injected wound with 10cc Lidocaine prior to dressing removal Wound type: surgical wound, non healing abdominal wound Measurement: see notes from this week Wound bed: clean, no odor after cleansing, oozing at wound edges Drainage (amount, consistency, odor) minimal in canister and some sanguinous at wound edges  Periwound: some erythema along the wound edge, treated with antifungal powder  Dressing procedure/placement/frequency: Cleansed wound well with wound cleanser.  Treated periwound skin with antifungal powder and "crusted" with no sting skin prep.  Filled wound bed with 1 long strip of black granufoam, and used second pc today to see if extra foam raised above the level of the wound would help facilitate better drainage of the wound bed.  Skin protected and bridge placed proximally.   WOC will follow along with you for complex VAC dressing M/W/F.  Will need to restart [facility] for VAC dressings once DC to home. Adaleena Mooers [place] RN,CWOCN [phone]

## 2013-12-26 NOTE — Progress Notes (Signed)
Physical Therapy Session Note  Patient Details  Name: [name] [name] MRN: [id number] Date of Birth: 1965/09/09  Today's Date: 12/26/2013 Time: 1030-1130 Time Calculation (min): 60 min  Short Term Goals: Week 1:  PT Short Term Goal 1 (Week 1): Pt will increase supine to edge of bed. edge of bed to supine with side rail and S.  PT Short Term Goal 2 (Week 1): Pt will increase transfer bed to chair, chair to bed with LRAD to S.  PT Short Term Goal 3 (Week 1): Pt will increase ambulation with LRAD to S about 15 feet.  PT Short Term Goal 4 (Week 1): Pt will increase w/c mobility to S about 25 to 50 feet.   Skilled Therapeutic Interventions/Progress Updates:    Session 1: Pt received supine in bed, agreeable to participate in therapy. Pt mildly perseverative on HA and increased leg pain since yesterday, but reported leg pain was muscle soreness and he was happy about it. Worked on bed mobility w/ therapist managing wound care tubing. Pt w/ supervision for rolling L, rolling R, and for moving supine <> sit. Nurse present to administer pain meds during session. Pt w/ SPT bed>w/c w/ MinA w/ bari RW. Pt propelled w/c 100' w/ MinA for steering and due to difficulty reaching push rims. In rehab gym, pt completed x1 SPT w/c <> mat w/ MinA. Attempted second transfer, but pt very fatigued and fearful during transfer and returned self to chair. Pt agreeable to stay up in wheelchair at end of session w/ min encouragement. Pt left seated in w/c w/ all needs within reach.  Session 2: Pt received seated in the w/c, hand off from COTA, agreeable to participate in therapy. Pt able to demonstrated trunk control/UE strengthening ex's that COTA had given him to do in room. Pt w/ TotalA w/c propulsion to rehab gym, completed SPT w/ RW w/ MinA w/c>mat. Seated at edge of mat, pt completed horseshoe tossing exercise to 5 different targets, and ball kicking exercise with therapist for trunk control, coordination, and dynamic  seated balance. Pt required frequent rest breaks during session, fatigued easily. Pt w/ two sit<> stands, remained standing for 1:15 (GDJ:MEQA) then 0:45 after seated rest break. Pt w/ SPT mat>w/c w/ MinA w/ RW and max cues to avoid breath holding and for upright posture. Pt requested to return to bed at end of session, performed SPT back to bed w/ MinA and mod cueing for upright posture. Pt reported legs very fatigued at end of session, unable to back up all the way to bed during transfer. Pt moved sit>supine w/ supervision. Pt left supine in bed w/ all needs within reach.  Therapy Documentation Precautions:  Precautions Precautions: Fall Precaution Comments: wound vac Restrictions Weight Bearing Restrictions: No General:   Vital Signs:   Pain: Pain Assessment Pain Score: 5  Mobility:   Locomotion : Ambulation Ambulation/Gait Assistance: 4: Min assist   See FIM for current functional status  Therapy/Group: Individual Therapy  Rada Hay Rada Hay, PT, DPT 12/26/2013, 5:32 PM

## 2013-12-26 NOTE — Progress Notes (Signed)
[place] [facility] PHYSICAL MEDICINE & REHABILITATION     PROGRESS NOTE    Subjective/Complaints: Had a good day. In general pain is better. Activity tolerance improving. In good spirits. Slept well   Objective: Vital Signs: Blood pressure 136/88, pulse 84, temperature 97.5 F (36.4 C), temperature source Oral, resp. rate 18, weight 205.3 kg (452 lb 9.7 oz), SpO2 97.00%. Mr [name] [name] Wo Contrast  12/24/2013   CLINICAL DATA:  Headache. Blurred vision. Reduced is. Previous pituitary tumor resection.  EXAM: MRI HEAD WITHOUT AND WITH CONTRAST  TECHNIQUE: Multiplanar, multiecho pulse sequences of the brain and surrounding structures were obtained without and with intravenous contrast.  CONTRAST:  92mL MULTIHANCE GADOBENATE DIMEGLUMINE 529 MG/ML IV SOLN  COMPARISON:  12/07/2013  FINDINGS: There is no acute or subacute infarction. The brainstem cerebellum are normal. Cerebral hemispheres are normal. No hydrocephalus or extra-axial collection. No evidence of intracranial hemorrhage.  The patient has had transsphenoidal surgery for debulking of the previously seen large an invasive sellar region mass. Most of the tissue the in the central sella in the tissue extending into the suprasellar region has been removed. There continues to be abnormal signal within the clivus consistent with bone invasion. 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 There is probably early invasion of the left cavernous sinus. Packing material is present in the sphenoid sinus.  IMPRESSION: Interval transsphenoidal surgery. Considerable debulking of the invasive mass with the epicenter in the region of the sella. Complete removal of the suprasellar component. 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   Electronically Signed   By: Nelson Chimes M.D.   On: 12/24/2013 18:04   No results found for this basename: WBC, HGB, HCT, PLT,  in the last  72 hours  Recent Labs  12/25/13 0445 12/26/13 0500  NA 143 144  K 4.6 3.9  CL 103 102  GLUCOSE 105* 108*  BUN 28* 30*  CREATININE 0.95 1.06  CALCIUM 9.0 9.2   CBG (last 3)  No results found for this basename: GLUCAP,  in the last 72 hours  Wt Readings from Last 3 Encounters:  12/26/13 205.3 kg (452 lb 9.7 oz)  12/20/13 199 kg (438 lb 11.5 oz)  12/20/13 199 kg (438 lb 11.5 oz)    Physical Exam:  Morbidly obese.  HENT:  Head: Normocephalic and atraumatic.  Eyes: Conjunctivae are normal. Pupils are equal, round, and reactive to light.   Neck: Normal range of motion. Neck supple.  Cardiovascular: Normal rate and regular rhythm.  No murmur heard.  Respiratory: Effort normal. No respiratory distress. He has wheezes.  GI: Soft. Bowel sounds are normal. He exhibits no distension. There is no tenderness.  Obese with VAC in place. Area remains red around dressing site/opsite Musculoskeletal: He exhibits no edema.  Swelling about both knees. Tender with ROM still Neurological: He is alert and oriented to person, place, and time.  Reasonable insight and awareness.  Skin: Skin is warm and dry.  Psychiatric: anxious   Assessment/Plan: 1. Functional deficits secondary to deconditioning related to multiple medical issues which require 3+ hours per day of interdisciplinary therapy in a comprehensive inpatient rehab setting. Physiatrist is providing close team supervision and 24 hour management of active medical problems listed below. Physiatrist and rehab team continue to assess barriers to discharge/monitor patient progress toward functional and medical goals.  Discussed mobility with patient. He wants a power chair as opposed  to a scooter as the chair is easier to maneuver within his home. i suggested renting a chair as opposed to buying one. Stressed the need to maintain weight bearing and lower extremity strength especially if he wants to lose weight and wants future  TKA's   FIM: FIM - Bathing Bathing Steps Patient Completed: Chest;Right Arm;Left Arm;Abdomen;Front perineal area;Buttocks;Right upper leg;Left upper leg Bathing: 4: Min-Patient completes 8-9 61f 10 parts or 75+ percent  FIM - Upper Body Dressing/Undressing Upper body dressing/undressing steps patient completed: Thread/unthread right sleeve of pullover shirt/dresss;Thread/unthread left sleeve of pullover shirt/dress;Put head through opening of pull over shirt/dress;Pull shirt over trunk Upper body dressing/undressing: 5: Set-up assist to: Obtain clothing/put away FIM - Lower Body Dressing/Undressing Lower body dressing/undressing steps patient completed: Thread/unthread right pants leg;Thread/unthread left pants leg;Pull pants up/down Lower body dressing/undressing: 2: Max-Patient completed 25-49% of tasks  FIM - Toileting Toileting steps completed by patient: Performs perineal hygiene Toileting Assistive Devices: Grab bar or rail for support Toileting: 2: Max-Patient completed 1 of 3 steps  FIM - Radio producer Devices: Nurse, learning disability Transfers: 4-To toilet/BSC: Min A (steadying Pt. > 75%)  FIM - Bed/Chair Transfer Bed/Chair Transfer Assistive Devices: Arm rests;Walker;Bed rails;HOB elevated Bed/Chair Transfer: 5: Supine > Sit: Supervision (verbal cues/safety issues);4: Bed > Chair or W/C: Min A (steadying Pt. > 75%)  FIM - Locomotion: Wheelchair Distance: 100 ft Locomotion: Wheelchair: 2: Travels 50 - 149 ft with minimal assistance (Pt.>75%) FIM - Locomotion: Ambulation Locomotion: Ambulation Assistive Devices: Administrator Ambulation/Gait Assistance: 4: Min assist Locomotion: Ambulation: 0: Activity did not occur  Comprehension Comprehension Mode: Auditory Comprehension: 7-Follows complex conversation/direction: With no assist  Expression Expression Mode: Verbal Expression: 7-Expresses complex ideas: With no assist  Social  Interaction Social Interaction: 6-Interacts appropriately with others with medication or extra time (anti-anxiety, antidepressant).  Problem Solving Problem Solving: 7-Solves complex problems: Recognizes & self-corrects  Memory Memory: 7-Complete Independence: No helper Medical Problem List and Plan:  1. Functional deficits secondary to deconditioning related to medical issues, pituitary tumor resection   -MRI with debulking---some residual  -tumor is NON-MALIGNANT. Rad-onc consult requested by NS 2. DVT Prophylaxis/Anticoagulation: Pharmaceutical: Lovenox  3. Pain Management: robaxin, percocet, add dilaudid for more severe/acute pain  -added topamax for headaches which has helped --titrated to 50mg  bid -arthrotec (using home supply) -added long acting morphine to better control knee pain which has helped 4. Mood: xanax prn, celexa, egosupport  5. Neuropsych: This patient is capable of making decisions on his own behalf.  6. 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 Wound care---vac per woc----area stable   -continue diflucan for yeast on skin near vac site 8. Enstage OA of bilateral knees--arthrotec  -pt followed by Dr. Alvan Dame. Recent xrays done  -was scheduled for synvisc. Probably just needs to wait until he is home and through these medical issues   LOS (Days) 6 A FACE TO FACE EVALUATION WAS PERFORMED  SWARTZ,ZACHARY T 12/26/2013 8:08 AM

## 2013-12-27 ENCOUNTER — Inpatient Hospital Stay (HOSPITAL_COMMUNITY): Payer: BC Managed Care – PPO | Admitting: Physical Therapy

## 2013-12-27 ENCOUNTER — Inpatient Hospital Stay (HOSPITAL_COMMUNITY): Payer: BC Managed Care – PPO | Admitting: *Deleted

## 2013-12-27 DIAGNOSIS — D496 Neoplasm of unspecified behavior of brain: Secondary | ICD-10-CM

## 2013-12-27 DIAGNOSIS — R5381 Other malaise: Secondary | ICD-10-CM

## 2013-12-27 DIAGNOSIS — I5033 Acute on chronic diastolic (congestive) heart failure: Secondary | ICD-10-CM

## 2013-12-27 LAB — BASIC METABOLIC PANEL
Anion gap: 11 (ref 5–15)
BUN: 29 mg/dL — ABNORMAL HIGH (ref 6–23)
CO2: 31 mEq/L (ref 19–32)
Calcium: 8.9 mg/dL (ref 8.4–10.5)
Chloride: 104 mEq/L (ref 96–112)
Creatinine, Ser: 1.02 mg/dL (ref 0.50–1.35)
GFR calc Af Amer: >90 mL/min (ref >90)
GFR calc non Af Amer: 84 mL/min — ABNORMAL LOW (ref >90)
Glucose, Bld: 98 mg/dL (ref 70–99)
Potassium: 3.9 mEq/L (ref 3.7–5.3)
Sodium: 146 mEq/L (ref 137–147)

## 2013-12-27 LAB — MAGNESIUM: Magnesium: 2.4 mg/dL (ref 1.5–2.5)

## 2013-12-27 MED ORDER — GRX ANALGESIC BALM EX OINT
2.0000 g | TOPICAL_OINTMENT | Freq: Two times a day (BID) | CUTANEOUS | Status: DC
  Filled 2013-12-27: qty 28

## 2013-12-27 MED ORDER — MUSCLE RUB 10-15 % EX CREA
TOPICAL_CREAM | Freq: Two times a day (BID) | CUTANEOUS | Status: DC
  Administered 2013-12-27 – 2013-12-30 (×6): via TOPICAL
  Filled 2013-12-27: qty 85

## 2013-12-27 NOTE — Progress Notes (Signed)
Occupational Therapy Session Note  Patient Details  Name: Glendal Cassaday MRN: [id number] Date of Birth: 1966/04/11  Today's Date: 12/27/2013 Time: 1330-1400 Time Calculation (min): 30 min  Short Term Goals: Week 1:  OT Short Term Goal 1 (Week 1): STG=LTG Week 2:     Skilled Therapeutic Interventions/Progress Updates:      Pt. Sitting in wc upon OT arrival.  Addressed wc mobility, and UE AROM and strengthening with BADL at sink.  Pt was SBA with wc mobility.  He performed hair washing and grooming at sink with set up assist.  Pt. Propelled wc back to bedside and left with call bell in reach.    Therapy Documentation Precautions:  Precautions Precautions: Fall Precaution Comments: wound vac Restrictions Weight Bearing Restrictions: No    Pain: Pain Assessment Pain Assessment: 0-10 Pain Score: 7  Pain Type: Acute pain Pain Location: Leg Pain Orientation: Right;Left Pain Descriptors / Indicators: Aching Pain Intervention(s):  (graded activity, pain science education)        See FIM for current functional status  Therapy/Group: Individual Therapy  Lisa Roca 12/27/2013, 7:09 PM

## 2013-12-27 NOTE — Progress Notes (Signed)
Physical Therapy Session Note  Patient Details  Name: [name] [name] MRN: [id number] Date of Birth: 1966-03-06  Today's Date: 12/27/2013 Time: 1440-1540 Time Calculation (min): 60 min  Short Term Goals: Week 1:  PT Short Term Goal 1 (Week 1): Pt will increase supine to edge of bed. edge of bed to supine with side rail and S.  PT Short Term Goal 2 (Week 1): Pt will increase transfer bed to chair, chair to bed with LRAD to S.  PT Short Term Goal 3 (Week 1): Pt will increase ambulation with LRAD to S about 15 feet.  PT Short Term Goal 4 (Week 1): Pt will increase w/c mobility to S about 25 to 50 feet.    Skilled Therapeutic Interventions/Progress Updates:    Pt reports anxiety regarding movement initially in session, benefiting from education in session to decrease this anxiety. Pt benefits from facilitation of diaphragmatic breathing to better recruit diaphragm, but still needs further re-ed to carry over into function. Pt carries over ventilatory strategies from previous session, but occasionally does require cueing to avoid valsalva. Pt able to tolerate pre-transfer activities without any substantial increase in pain.  Therapy Documentation Precautions:  Precautions Precautions: Fall Precaution Comments: wound vac Restrictions Weight Bearing Restrictions: No Vital Signs: Therapy Vitals Temp: 98.1 F (36.7 C) Temp src: Oral Pulse Rate: 81 Resp: 20 BP: 122/87 mmHg Patient Position (if appropriate): Lying Oxygen Therapy SpO2: 97 % O2 Device: None (Room air) Pain: Pain Assessment Pain Assessment: 0-10 Pain Score: 7  Pain Type: Acute pain Pain Location: Leg Pain Orientation: Right;Left Pain Descriptors / Indicators: Aching Pain Intervention(s):  (graded activity, pain science education) Mobility:  Pt Min A for transfers with cues for ventilation Locomotion : Ambulation Ambulation/Gait Assistance: 4: Min assist 5' with cues for sequencing and posture Balance: Static  Sitting Balance Static Sitting - Balance Support: Feet supported Static Sitting - Level of Assistance: 5: Stand by assistance Static Sitting - Comment/# of Minutes: back unsupported from backrest  Dynamic Sitting Balance Sitting balance - Comments: tolerated sitting EOB >15 minutes static and dynamic movements tolerated in sitting edge of wheelchair with interventions as below Static Standing Balance Static Standing - Balance Support: During functional activity;Bilateral upper extremity supported Static Standing - Level of Assistance: 4: Min assist Static Standing - Comment/# of Minutes: 1' Dynamic Standing Balance Dynamic Standing - Balance Support: Bilateral upper extremity supported;During functional activity Dynamic Standing - Level of Assistance: 4: Min assist Other Treatments:   Pt performs anterior weight shifts 2x10 in wheel chair with ventilatory cues. Pt performs neutral spine sitting with bicep curls with 3# dowel, stirring the pot with 3# dowel, driving with 3# dowel, tonic spread 2'x2, marching 2x10, chest press with isometric hold 2', continuous phonation 2'x2. Pt educated on pain science, ventilation, progressing mobility, and valsalva. Pt performs incentive spirometry x10 with cues for technique. Diaphragmatic breathing facilitated then performed 2x2'  See FIM for current functional status  Therapy/Group: Individual Therapy  Monia Pouch 12/27/2013, 5:32 PM

## 2013-12-27 NOTE — Progress Notes (Signed)
Pt setup on BiPAP (18/6 with 3 lpm bleed-in O2 via FFM) for night.  Pt comfortable and tolerating well.  RT to monitor as needed.

## 2013-12-27 NOTE — Progress Notes (Signed)
[place] PHYSICAL MEDICINE & REHABILITATION     PROGRESS NOTE    Subjective/Complaints: SOB with exercise Knee pain does limit activity Review of Systems - Negative except HA and Knee pain Objective: Vital Signs: Blood pressure 152/85, pulse 80, temperature 97.5 F (36.4 C), temperature source Oral, resp. rate 20, weight 207 kg (456 lb 5.6 oz), SpO2 100.00%. No results found. No results found for this basename: WBC, HGB, HCT, PLT,  in the last 72 hours  Recent Labs  12/26/13 0500 12/27/13 0543  NA 144 146  K 3.9 3.9  CL 102 104  GLUCOSE 108* 98  BUN 30* 29*  CREATININE 1.06 1.02  CALCIUM 9.2 8.9   CBG (last 3)  No results found for this basename: GLUCAP,  in the last 72 hours  Wt Readings from Last 3 Encounters:  12/27/13 207 kg (456 lb 5.6 oz)  12/20/13 199 kg (438 lb 11.5 oz)  12/20/13 199 kg (438 lb 11.5 oz)    Physical Exam:  Morbidly obese.  HENT:  Head: Normocephalic and atraumatic.  Eyes: Conjunctivae are normal. Pupils are equal, round, and reactive to light.   Neck: Normal range of motion. Neck supple.  Cardiovascular: Normal rate and regular rhythm.  No murmur heard.  Respiratory: Effort normal. No respiratory distress. He has wheezes.  GI: Soft. Bowel sounds are normal. He exhibits no distension. There is no tenderness.  Obese with VAC in place. Area remains red around dressing site/opsite Musculoskeletal: He exhibits no edema.  Swelling about both knees. Tender with ROM still Neurological: He is alert and oriented to person, place, and time.  Reasonable insight and awareness.  Skin: Skin is warm and dry.  Psychiatric: anxious   Assessment/Plan: 1. Functional deficits secondary to deconditioning related to multiple medical issues which require 3+ hours per day of interdisciplinary therapy in a comprehensive inpatient rehab setting. Physiatrist is providing close team supervision and 24 hour management of active medical problems listed  below. Physiatrist and rehab team continue to assess barriers to discharge/monitor patient progress toward functional and medical goals.  Discussed mobility with patient. He wants a power chair as opposed to a scooter as the chair is easier to maneuver within his home. i suggested renting a chair as opposed to buying one. Stressed the need to maintain weight bearing and lower extremity strength especially if he wants to lose weight and wants future TKA's   FIM: FIM - Bathing Bathing Steps Patient Completed: Chest;Right Arm;Left Arm;Abdomen;Front perineal area;Buttocks;Right upper leg;Left upper leg Bathing: 4: Min-Patient completes 8-9 55f 10 parts or 75+ percent  FIM - Upper Body Dressing/Undressing Upper body dressing/undressing steps patient completed: Thread/unthread right sleeve of pullover shirt/dresss;Thread/unthread left sleeve of pullover shirt/dress;Put head through opening of pull over shirt/dress;Pull shirt over trunk Upper body dressing/undressing: 5: Set-up assist to: Obtain clothing/put away FIM - Lower Body Dressing/Undressing Lower body dressing/undressing steps patient completed: Thread/unthread right pants leg;Thread/unthread left pants leg;Pull pants up/down Lower body dressing/undressing: 4: Min-Patient completed 75 plus % of tasks  FIM - Toileting Toileting steps completed by patient: Adjust clothing prior to toileting;Performs perineal hygiene;Adjust clothing after toileting Toileting Assistive Devices: Grab bar or rail for support Toileting: 4: Steadying assist  FIM - Radio producer Devices: Bedside commode Toilet Transfers: 4-To toilet/BSC: Min A (steadying Pt. > 75%);4-From toilet/BSC: Min A (steadying Pt. > 75%)  FIM - Bed/Chair Transfer Bed/Chair Transfer Assistive Devices: Arm rests;Walker;Bed rails;HOB elevated Bed/Chair Transfer: 4: Bed > Chair or W/C: Min A (steadying  Pt. > 75%);5: Supine > Sit: Supervision (verbal cues/safety  issues);4: Sit > Supine: Min A (steadying pt. > 75%/lift 1 leg)  FIM - Locomotion: Wheelchair Distance: 100 ft Locomotion: Wheelchair: 2: Travels 50 - 149 ft with minimal assistance (Pt.>75%) FIM - Locomotion: Ambulation Locomotion: Ambulation Assistive Devices: Administrator Ambulation/Gait Assistance: 4: Min assist Locomotion: Ambulation: 1: Travels less than 50 ft with minimal assistance (Pt.>75%)  Comprehension Comprehension Mode: Auditory Comprehension: 7-Follows complex conversation/direction: With no assist  Expression Expression Mode: Verbal Expression: 7-Expresses complex ideas: With no assist  Social Interaction Social Interaction: 6-Interacts appropriately with others with medication or extra time (anti-anxiety, antidepressant).  Problem Solving Problem Solving: 7-Solves complex problems: Recognizes & self-corrects  Memory Memory: 7-Complete Independence: No helper Medical Problem List and Plan:  1. Functional deficits secondary to deconditioning related to medical issues, pituitary tumor resection   -MRI with debulking---some residual  -tumor is NON-MALIGNANT. Rad-onc consult requested by NS 2. DVT Prophylaxis/Anticoagulation: Pharmaceutical: Lovenox  3. Pain Management: robaxin, percocet, add dilaudid for more severe/acute pain  -added topamax for headaches which has helped --titrated to 50mg  bid -arthrotec (using home supply) -added long acting morphine to better control knee pain which has helped 4. Mood: xanax prn, celexa, egosupport  5. Neuropsych: This patient is capable of making decisions on his own behalf.  6. 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 Wound care---vac per woc----area stable   -continue diflucan for yeast on skin near vac site 8. Enstage OA of bilateral knees--arthrotec , trial analgesic cream -pt followed by Dr. Alvan Dame. Recent xrays done  -was scheduled for synvisc. Probably just needs to wait until he is home and through  these medical issues   LOS (Days) 7 A FACE TO FACE EVALUATION WAS PERFORMED  Charlett Blake 12/27/2013 8:46 AM

## 2013-12-28 ENCOUNTER — Inpatient Hospital Stay (HOSPITAL_COMMUNITY): Payer: BC Managed Care – PPO | Admitting: Physical Therapy

## 2013-12-28 ENCOUNTER — Inpatient Hospital Stay (HOSPITAL_COMMUNITY): Payer: BC Managed Care – PPO | Admitting: *Deleted

## 2013-12-28 LAB — BASIC METABOLIC PANEL
Anion gap: 10 (ref 5–15)
BUN: 27 mg/dL — ABNORMAL HIGH (ref 6–23)
CO2: 31 mEq/L (ref 19–32)
Calcium: 9.3 mg/dL (ref 8.4–10.5)
Chloride: 105 mEq/L (ref 96–112)
Creatinine, Ser: 1.01 mg/dL (ref 0.50–1.35)
GFR calc Af Amer: >90 mL/min (ref >90)
GFR calc non Af Amer: 85 mL/min — ABNORMAL LOW (ref >90)
Glucose, Bld: 117 mg/dL — ABNORMAL HIGH (ref 70–99)
Potassium: 3.9 mEq/L (ref 3.7–5.3)
Sodium: 146 mEq/L (ref 137–147)

## 2013-12-28 LAB — CBC
HCT: 38 % — ABNORMAL LOW (ref 39.0–52.0)
Hemoglobin: 11.4 g/dL — ABNORMAL LOW (ref 13.0–17.0)
MCH: 27.6 pg (ref 26.0–34.0)
MCHC: 30 g/dL (ref 30.0–36.0)
MCV: 92 fL (ref 78.0–100.0)
Platelets: 154 10*3/uL (ref 150–400)
RBC: 4.13 MIL/uL — ABNORMAL LOW (ref 4.22–5.81)
RDW: 19.2 % — ABNORMAL HIGH (ref 11.5–15.5)
WBC: 5 10*3/uL (ref 4.0–10.5)

## 2013-12-28 LAB — MAGNESIUM: Magnesium: 2.3 mg/dL (ref 1.5–2.5)

## 2013-12-28 NOTE — Progress Notes (Signed)
[place] PHYSICAL MEDICINE & REHABILITATION     PROGRESS NOTE    Subjective/Complaints:  Had a good night, sportscreme helping with knee pain Review of Systems - Negative except HA and Knee pain Objective: Vital Signs: Blood pressure 132/80, pulse 78, temperature 97.7 F (36.5 C), temperature source Axillary, resp. rate 18, weight 207 kg (456 lb 5.6 oz), SpO2 94.00%. No results found.  Recent Labs  12/28/13 0522  WBC 5.0  HGB 11.4*  HCT 38.0*  PLT 154    Recent Labs  12/27/13 0543 12/28/13 0522  NA 146 146  K 3.9 3.9  CL 104 105  GLUCOSE 98 117*  BUN 29* 27*  CREATININE 1.02 1.01  CALCIUM 8.9 9.3   CBG (last 3)  No results found for this basename: GLUCAP,  in the last 72 hours  Wt Readings from Last 3 Encounters:  12/27/13 207 kg (456 lb 5.6 oz)  12/20/13 199 kg (438 lb 11.5 oz)  12/20/13 199 kg (438 lb 11.5 oz)    Physical Exam:  Morbidly obese.  HENT:  Head: Normocephalic and atraumatic.  Eyes: Conjunctivae are normal. Pupils are equal, round, and reactive to light.   Neck: Normal range of motion. Neck supple.  Cardiovascular: Normal rate and regular rhythm.  No murmur heard.  Respiratory: Effort normal. No respiratory distress. He has wheezes.  GI: Soft. Bowel sounds are normal. He exhibits no distension. There is no tenderness.  Obese with VAC in place. Area remains red around dressing site/opsite Musculoskeletal: He exhibits no edema.  Swelling about both knees. Tender with ROM still Neurological: He is alert and oriented to person, place, and time.  Reasonable insight and awareness.  Skin: Skin is warm and dry.  Psychiatric: anxious   Assessment/Plan: 1. Functional deficits secondary to deconditioning related to multiple medical issues which require 3+ hours per day of interdisciplinary therapy in a comprehensive inpatient rehab setting. Physiatrist is providing close team supervision and 24 hour management of active medical problems listed  below. Physiatrist and rehab team continue to assess barriers to discharge/monitor patient progress toward functional and medical goals.  Questions about home Bipap   FIM: FIM - Bathing Bathing Steps Patient Completed: Chest;Right Arm;Left Arm;Abdomen;Front perineal area;Buttocks;Right upper leg;Left upper leg Bathing: 4: Min-Patient completes 8-9 29f 10 parts or 75+ percent  FIM - Upper Body Dressing/Undressing Upper body dressing/undressing steps patient completed: Thread/unthread right sleeve of pullover shirt/dresss;Thread/unthread left sleeve of pullover shirt/dress;Put head through opening of pull over shirt/dress;Pull shirt over trunk Upper body dressing/undressing: 5: Set-up assist to: Obtain clothing/put away FIM - Lower Body Dressing/Undressing Lower body dressing/undressing steps patient completed: Thread/unthread right pants leg;Thread/unthread left pants leg;Pull pants up/down Lower body dressing/undressing: 4: Min-Patient completed 75 plus % of tasks  FIM - Toileting Toileting steps completed by patient: Performs perineal hygiene Toileting Assistive Devices: Grab bar or rail for support Toileting: 2: Max-Patient completed 1 of 3 steps  FIM - Radio producer Devices: Recruitment consultant Transfers: 4-To toilet/BSC: Min A (steadying Pt. > 75%);4-From toilet/BSC: Min A (steadying Pt. > 75%)  FIM - Bed/Chair Transfer Bed/Chair Transfer Assistive Devices: Arm rests;Walker;Bed rails;HOB elevated Bed/Chair Transfer: 4: Bed > Chair or W/C: Min A (steadying Pt. > 75%)  FIM - Locomotion: Wheelchair Distance: 100 ft Locomotion: Wheelchair: 0: Activity did not occur FIM - Locomotion: Ambulation Locomotion: Ambulation Assistive Devices: Administrator Ambulation/Gait Assistance: 4: Min assist Locomotion: Ambulation: 1: Travels less than 50 ft with minimal assistance (Pt.>75%)  Comprehension Comprehension  Mode: Auditory Comprehension: 7-Follows  complex conversation/direction: With no assist  Expression Expression Mode: Verbal Expression: 7-Expresses complex ideas: With no assist  Social Interaction Social Interaction: 6-Interacts appropriately with others with medication or extra time (anti-anxiety, antidepressant).  Problem Solving Problem Solving: 7-Solves complex problems: Recognizes & self-corrects  Memory Memory: 7-Complete Independence: No helper Medical Problem List and Plan:  1. Functional deficits secondary to deconditioning related to medical issues, pituitary tumor resection   -MRI with debulking---some residual  -tumor is NON-MALIGNANT. Rad-onc consult requested by NS 2. DVT Prophylaxis/Anticoagulation: Pharmaceutical: Lovenox  3. Pain Management: robaxin, percocet, add dilaudid for more severe/acute pain  -added topamax for headaches which has helped --titrated to 50mg  bid -arthrotec (using home supply) -added long acting morphine to better control knee pain which has helped 4. Mood: xanax prn, celexa, egosupport  5. Neuropsych: This patient is capable of making decisions on his own behalf.  6. 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 Wound care---vac per woc----area stable   -continue diflucan for yeast on skin near vac site 8. Enstage OA of bilateral knees--arthrotec , trial analgesic cream -pt followed by Dr. Alvan Dame. Recent xrays done  -was scheduled for synvisc. Probably just needs to wait until he is home and through these medical issues   LOS (Days) [place] E 12/28/2013 9:02 AM

## 2013-12-28 NOTE — Progress Notes (Addendum)
Physical Therapy Session Note  Patient Details  Name: [name] [name] MRN: [id number] Date of Birth: 1966/01/04  Today's Date: 12/28/2013 Time: 1100-1204, 2947-6546 Time Calculation (min): 60 min  Short Term Goals: Week 1:  PT Short Term Goal 1 (Week 1): Pt will increase supine to edge of bed. edge of bed to supine with side rail and S.  PT Short Term Goal 2 (Week 1): Pt will increase transfer bed to chair, chair to bed with LRAD to S.  PT Short Term Goal 3 (Week 1): Pt will increase ambulation with LRAD to S about 15 feet.  PT Short Term Goal 4 (Week 1): Pt will increase w/c mobility to S about 25 to 50 feet.   Skilled Therapeutic Interventions/Progress Updates:    Pt demonstrates improved bed mobility in session using ventilatory cues and manual techniques. Decreased tightness in rolling noted, increased ability to actively perform heel slide,, and increased ability to perform SLR noted with ventilatory cues. Pt demonstrates improvement increasing gait distance, but still limited in standing by knee pain. Pt does benefit from repeated L/S ext in sitting consistent with posterior L/S derangement. Limited response in standing though so may benefit from unloaded repeated ext  Therapy Documentation Precautions:  Precautions Precautions: Fall Precaution Comments: wound vac Restrictions Weight Bearing Restrictions: No Pain: Pain Assessment Pain Assessment: 0-10 Pain Score: 9  Pain Type: Acute pain Pain Location: Leg Pain Orientation: Right;Left Pain Descriptors / Indicators: Aching Pain Onset: On-going Pain Intervention(s):  (manual, graded activity, neuromuscular re-ed) Mobility:  Pt performs all bed mobility with supervision and cues for technique. Pt performs transfer training with cues for weight shift and ventilation. Locomotion : Ambulation Ambulation/Gait Assistance: 4: Min assist 7' with cues for posture and AD Balance: Static Sitting Balance Static Sitting - Balance  Support: Feet supported Static Sitting - Level of Assistance: 5: Stand by assistance Dynamic Sitting Balance Sitting balance - Comments: tolerated sitting EOB and edge of chair 15 minutes static and dynamic movements  Static Standing Balance Static Standing - Balance Support: During functional activity;Bilateral upper extremity supported Static Standing - Level of Assistance: 4: Min assist Static Standing - Comment/# of Minutes: 1'x2 Dynamic Standing Balance Dynamic Standing - Balance Support: Bilateral upper extremity supported;During functional activity Dynamic Standing - Level of Assistance: 4: Min assist Other Treatments:   AM Tx: Pt performs heel slides with AAROM, patellar mobs, ventilatory cues 2x10 each. Pt performs SLR and hip abd AROM 2x10. Pt performs trunk rotation AAROM with each extremity x5. Pt performs partial segmental rolling 2x5 with each extremity. Pt performs repeated transfers x5. General abd and old scar myofasicial releases performed far way from wound vac site. Pt educated further on pain science and retraining movement patterns to reduce pain.  PM Tx: Pt performs repeated seated L/S ext 4x10. Pt performs standing repeated L/S ext 2x10 with RW. Increased L/S ext performed with manual facilitation x5. Pt performs seated knee flexion 2x10 with some active assist. Pt educated on regional interdependence and progressing mobility. Quadriceps release performed. Positioned in increase L/S ext and educated on positioning  See FIM for current functional status  Therapy/Group: Individual Therapy  Monia Pouch 12/28/2013, 12:24 PM

## 2013-12-28 NOTE — Progress Notes (Signed)
Occupational Therapy Session Note  Patient Details  Name: [name] [name] MRN: [id number] Date of Birth: February 06, 1966  Today's Date: 12/28/2013 Time:  -   0731- 0840  (69 sec)  1st session    Short Term Goals: Week 1:  OT Short Term Goal 1 (Week 1): STG=LTG :     Skilled Therapeutic Interventions/Progress Updates:       1st sessionPt. Lying supine upon OT arrival.  Pt. Adjusted bed and went from supine to sidelying to sit with bed rails with SBA assistance.  Sat EOB and did bathing and dressing.  Went from sit to stand with min assist to don pants.  Stood for 45 sec during this time to don pants with min assist getting over hips.  .  Sat EOB and went to supine with SBA.  .     Time:1213-1313  (50 min) 2nd session Pain: 6/10 back Individual session;   Pt. Sitting in wc upon OT arrival.  Performed UE AROM and strengthening.  Utilized 3# bar weight and 4# hand weight.  Pt. Returned exercises with minimal instructional cues.  Added some diaphragmatic exercises as well.  Pt. Returned these exercises as well.      Therapy Documentation Precautions:  Precautions Precautions: Fall Precaution Comments: wound vac Restrictions Weight Bearing Restrictions: No    Pain: Pain Assessment Pain Assessment: 0-10 Pain Score 7 Pain Type: Acute pain Pain Location: Head ache and BLE Pain Orientation: Anterior Pain Descriptors / Indicators: Aching Pain Onset: On-going Pain Intervention(s): Medication (See eMAR)       Therapy/Group: Individual Therapy  Lisa Roca 12/28/2013, 8:10 AM

## 2013-12-29 ENCOUNTER — Inpatient Hospital Stay (HOSPITAL_COMMUNITY): Payer: BC Managed Care – PPO

## 2013-12-29 ENCOUNTER — Inpatient Hospital Stay (HOSPITAL_COMMUNITY): Payer: BC Managed Care – PPO | Admitting: Occupational Therapy

## 2013-12-29 ENCOUNTER — Encounter: Payer: Self-pay | Admitting: Radiation Oncology

## 2013-12-29 ENCOUNTER — Encounter (HOSPITAL_COMMUNITY): Payer: BC Managed Care – PPO

## 2013-12-29 ENCOUNTER — Ambulatory Visit
Admit: 2013-12-29 | Discharge: 2013-12-29 | Disposition: A | Discharge status: Home / Self Care | Payer: BC Managed Care – PPO | Attending: Radiation Oncology | Admitting: Radiation Oncology

## 2013-12-29 ENCOUNTER — Telehealth: Payer: Self-pay | Admitting: Pulmonary Disease

## 2013-12-29 DIAGNOSIS — D496 Neoplasm of unspecified behavior of brain: Secondary | ICD-10-CM

## 2013-12-29 DIAGNOSIS — G9389 Other specified disorders of brain: Secondary | ICD-10-CM

## 2013-12-29 DIAGNOSIS — I5033 Acute on chronic diastolic (congestive) heart failure: Secondary | ICD-10-CM

## 2013-12-29 DIAGNOSIS — R220 Localized swelling, mass and lump, head: Principal | ICD-10-CM

## 2013-12-29 DIAGNOSIS — R5381 Other malaise: Secondary | ICD-10-CM

## 2013-12-29 LAB — BASIC METABOLIC PANEL
Anion gap: 9 (ref 5–15)
BUN: 28 mg/dL — ABNORMAL HIGH (ref 6–23)
CO2: 31 mEq/L (ref 19–32)
Calcium: 9.1 mg/dL (ref 8.4–10.5)
Chloride: 105 mEq/L (ref 96–112)
Creatinine, Ser: 1.02 mg/dL (ref 0.50–1.35)
GFR calc Af Amer: >90 mL/min (ref >90)
GFR calc non Af Amer: 84 mL/min — ABNORMAL LOW (ref >90)
Glucose, Bld: 129 mg/dL — ABNORMAL HIGH (ref 70–99)
Potassium: 3.9 mEq/L (ref 3.7–5.3)
Sodium: 145 mEq/L (ref 137–147)

## 2013-12-29 LAB — MAGNESIUM: Magnesium: 2.2 mg/dL (ref 1.5–2.5)

## 2013-12-29 MED ORDER — DICLOFENAC SODIUM 1 % TD GEL
4.0000 g | Freq: Four times a day (QID) | TRANSDERMAL | Status: DC
  Administered 2013-12-29 – 2014-01-01 (×9): 4 g via TOPICAL
  Filled 2013-12-29: qty 100

## 2013-12-29 NOTE — Plan of Care (Signed)
Problem: RH Ambulation Goal: LTG Patient will ambulate in controlled environment (PT) LTG: Patient will ambulate in a controlled environment, # of feet with assistance (PT).  Upgraded due to improved functional endurance and tolerance to WB Goal: LTG Patient will ambulate in home environment (PT) LTG: Patient will ambulate in home environment, # of feet with assistance (PT).  Upgraded due to improved functional endurance and tolerance to WB  Problem: RH Wheelchair Mobility Goal: LTG Patient will propel w/c in controlled environment (PT) LTG: Patient will propel wheelchair in controlled environment, # of feet with assist (PT)  Downgraded due to decreased functional endurance. Goal: LTG Patient will propel w/c in home environment (PT) LTG: Patient will propel wheelchair in home environment, # of feet with assistance (PT).  Downgraded due to decreased functional endurance. Goal: LTG Patient will propel w/c in community environment (PT) LTG: Patient will propel wheelchair in community environment, # of feet with assist (PT)  Downgraded due to decreased functional endurance.

## 2013-12-29 NOTE — Telephone Encounter (Signed)
I had been trying to contact pt to reschedule HST after he had cancelled 11/13/13 appt to pick up [place].  Pt's mother called  & spoke with [facility] today stating he is in [facility] & she will have him call back after he is discharged & is feeling up to doing HST.  I am putting pt's HST order in the folder in [place]'s office for HST's that we have not been able to get scheduled [place] [facility]

## 2013-12-29 NOTE — Progress Notes (Signed)
[place] PHYSICAL MEDICINE & REHABILITATION     PROGRESS NOTE    Subjective/Complaints:  No new issues this weekend Review of Systems - Negative except HA and Knee pain  Objective: Vital Signs: Blood pressure 152/91, pulse 73, temperature 97.5 F (36.4 C), temperature source Oral, resp. rate 18, weight 209 kg (460 lb 12.2 oz), SpO2 96.00%. No results found.  Recent Labs  12/28/13 0522  WBC 5.0  HGB 11.4*  HCT 38.0*  PLT 154    Recent Labs  12/28/13 0522 12/29/13 0439  NA 146 145  K 3.9 3.9  CL 105 105  GLUCOSE 117* 129*  BUN 27* 28*  CREATININE 1.01 1.02  CALCIUM 9.3 9.1   CBG (last 3)  No results found for this basename: GLUCAP,  in the last 72 hours  Wt Readings from Last 3 Encounters:  12/29/13 209 kg (460 lb 12.2 oz)  12/20/13 199 kg (438 lb 11.5 oz)  12/20/13 199 kg (438 lb 11.5 oz)    Physical Exam:  Morbidly obese.  HENT:  Head: Normocephalic and atraumatic.  Eyes: Conjunctivae are normal. Pupils are equal, round, and reactive to light.   Neck: Normal range of motion. Neck supple.  Cardiovascular: Normal rate and regular rhythm.  No murmur heard.  Respiratory: Effort normal. No respiratory distress. He has wheezes.  GI: Soft. Bowel sounds are normal. He exhibits no distension. There is no tenderness.  Obese with VAC in place. Area remains red around dressing site/opsite Musculoskeletal: He exhibits no edema.  Swelling around both knees. Tender with ROM still Neurological: He is alert and oriented to person, place, and time.  Reasonable insight and awareness.  Skin: Skin is warm and dry.  Psychiatric: anxious   Assessment/Plan: 1. Functional deficits secondary to deconditioning related to multiple medical issues which require 3+ hours per day of interdisciplinary therapy in a comprehensive inpatient rehab setting. Physiatrist is providing close team supervision and 24 hour management of active medical problems listed below. Physiatrist and  rehab team continue to assess barriers to discharge/monitor patient progress toward functional and medical goals.    FIM: FIM - Bathing Bathing Steps Patient Completed: Chest;Right Arm;Left Arm;Abdomen;Front perineal area;Buttocks;Right upper leg;Left upper leg Bathing: 4: Min-Patient completes 8-9 77f 10 parts or 75+ percent  FIM - Upper Body Dressing/Undressing Upper body dressing/undressing steps patient completed: Thread/unthread right sleeve of pullover shirt/dresss;Thread/unthread left sleeve of pullover shirt/dress;Put head through opening of pull over shirt/dress;Pull shirt over trunk Upper body dressing/undressing: 5: Set-up assist to: Obtain clothing/put away FIM - Lower Body Dressing/Undressing Lower body dressing/undressing steps patient completed: Thread/unthread right pants leg;Thread/unthread left pants leg;Pull pants up/down Lower body dressing/undressing: 4: Min-Patient completed 75 plus % of tasks  FIM - Toileting Toileting steps completed by patient: Performs perineal hygiene Toileting Assistive Devices: Grab bar or rail for support Toileting: 2: Max-Patient completed 1 of 3 steps  FIM - Radio producer Devices: Recruitment consultant Transfers: 0-Activity did not occur  FIM - Control and instrumentation engineer Devices: Arm rests;Walker;Bed rails;HOB elevated Bed/Chair Transfer: 4: Bed > Chair or W/C: Min A (steadying Pt. > 75%);5: Supine > Sit: Supervision (verbal cues/safety issues)  FIM - Locomotion: Wheelchair Distance: 100 ft Locomotion: Wheelchair: 0: Activity did not occur FIM - Locomotion: Ambulation Locomotion: Ambulation Assistive Devices: Administrator Ambulation/Gait Assistance: 4: Min assist Locomotion: Ambulation: 1: Travels less than 50 ft with minimal assistance (Pt.>75%)  Comprehension Comprehension Mode: Auditory Comprehension: 7-Follows complex conversation/direction: With no  assist  Expression Expression Mode: Verbal Expression: 7-Expresses complex ideas: With no assist  Social Interaction Social Interaction: 6-Interacts appropriately with others with medication or extra time (anti-anxiety, antidepressant).  Problem Solving Problem Solving: 7-Solves complex problems: Recognizes & self-corrects  Memory Memory: 7-Complete Independence: No helper Medical Problem List and Plan:  1. Functional deficits secondary to deconditioning related to medical issues, pituitary tumor resection   -MRI with debulking---some residual  -tumor is NON-MALIGNANT. Rad-onc consult requested by NS 2. DVT Prophylaxis/Anticoagulation: Pharmaceutical: Lovenox  3. Pain Management: robaxin, percocet, add dilaudid for more severe/acute pain  -topamax 50mg  bid -arthrotec (using home supply) -ms contin helpful 4. Mood: xanax prn, celexa, egosupport  5. Neuropsych: This patient is capable of making decisions on his own behalf.  6. 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 Wound care---vac per woc----area stable   -dc diflucan 8. Enstage OA of bilateral knees--arthrotec , trial analgesic cream -pt followed by Dr. Alvan Dame. Recent xrays done  -was scheduled for synvisc. Probably just needs to wait until he is home and through these medical issues   LOS (Days) 9 A FACE TO FACE EVALUATION WAS PERFORMED  SWARTZ,ZACHARY T 12/29/2013 8:53 AM

## 2013-12-29 NOTE — Progress Notes (Signed)
Occupational Therapy Weekly Progress Note  Patient Details  Name: [name] [name] MRN: [id number] Date of Birth: 1965/08/20  Beginning of progress report period: December 21, 2013 End of progress report period: December 29, 2013  Today's Date: 12/29/2013  Short term goals not set due to estimated length of stay.  Pt progress with BADLs has been slow since admission.  Pt continues to require min A/mod A for sit<>stand from elevated surface and min A for toilet transfers.  Pt unable to consistently remove UE from RW when standing to pull up pants and requires assistance. Pt requires mod A for toileting tasks secondary to required assistance for clothing management.  Pt uses AE appropriately for LB dressing.  Pt continues to require extra time to complete all tasks and requires multiple rest breaks throughout sessions. Pt would benefit from extending LOS to allow time to fully reach mod I/supervision goals as he continues to fluctuate based on pain level and endurance.  Patient continues to demonstrate the following deficits: BLE weakness and pain, decreased activity tolerance and endurance, and therefore will continue to benefit from skilled OT intervention to enhance overall performance with BADL and Reduce care partner burden.  See Patient's Care Plan for progression toward long term goals.  Patient progressing toward long term goals..  Continue plan of care.   Therapy Documentation Precautions:  Precautions Precautions: Fall Precaution Comments: wound vac Restrictions Weight Bearing Restrictions: No   Pain: Pain Assessment Pain Assessment: 0-10 Pain Score: 10-Worst pain ever Pain Type: Acute pain Pain Location: Knee Pain Orientation: Right;Left Pain Radiating Towards: behind eye Pain Descriptors / Indicators: Aching Pain Frequency: Constant Pain Onset: With Activity Patients Stated Pain Goal: 4 Pain Intervention(s): Repositioned;RN made aware  See FIM for current functional  status  Leroy Libman 12/29/2013, 12:15 PM

## 2013-12-29 NOTE — Progress Notes (Signed)
  Radiation Oncology         [phone]) [phone] ________________________________  Name: [name] [name] MRN: [id number]  Date: 12/29/2013  DOB: 10/17/1965  Chart Note:  I received a request to see this patient in consultation for his pituitary tumor.  I will plan to see him in consultation tomorrow afternoon.  Given the proximity of the residual tumor to optic nerves, he may be a candidate for conventionally fractionated conformal radiotherapy.  ________________________________  Sheral Apley. Tammi Klippel, M.D.

## 2013-12-29 NOTE — Consult Note (Signed)
WOC wound follow up  Injected wound with 10cc Lidocaine prior to dressing removal  Wound type: surgical wound, non healing abdominal wound  Wound bed: clean, no odor after cleansing Drainage (amount, consistency, odor) moderate serosanguinous in canister and some sanguinous at wound edges  Periwound: some erythema along the wound edge, treated with antifungal powder  Dressing procedure/placement/frequency:  Cleansed wound well with wound cleanser. Treated periwound skin with antifungal powder and "crusted" with no sting skin prep.  Filled wound bed with 1 long strip of black granufoam.  Skin protected and bridge placed proximally.  WOC will follow along with you for complex VAC dressing M/W/F.  Will need to restart [facility] for VAC dressings once DC to home.  Delene Morais [place] RN,CWOCN  [phone]

## 2013-12-29 NOTE — Progress Notes (Signed)
Occupational Therapy Session Note  Patient Details  Name: [name] [name] MRN: [id number] Date of Birth: 05-22-1966  Today's Date: 12/29/2013 Time: 0800-0930 Time Calculation (min): 90 min  Short Term Goals: Week 1:  OT Short Term Goal 1 (Week 1): STG=LTG  Skilled Therapeutic Interventions/Progress Updates:    Pt resting in bed upon arrival and ready for therapy.  Pt engaged in BADL retraining including bathing and dressing with sit<>stand from EOB.  Focus on activity tolerance, sitting balance, sit<>stand, standing balance, AE use for LB dressing tasks, and safety awareness.  Pt required mod A for sit<>stand from elevated surface this morning.  Pt was unable to remove BUE from RW this morning to pul up pants and required assistance to complete task.  Pt used sock aid to don socks.  Pt continues to require assistance with sit<>stand, standing balance without UE support.  Pt continues to fatigue quickly and requires multiple rest breaks throughout session.  Pt transitioned to BUE therex seated in unsupported sitting EOB.  Therex included shoulder presses, truck rotation, bicep curls, and shoulder retractions with 3# weight bar. Pt returned to supine position in bed to await wound care RN.  All needs within reach.  Therapy Documentation Precautions:  Precautions Precautions: Fall Precaution Comments: wound vac Restrictions Weight Bearing Restrictions: No Pain: Pain Assessment Pain Assessment: 0-10 Pain Score: 10-Worst pain ever Pain Type: Acute pain Pain Location: Knee Pain Orientation: Right;Left Pain Radiating Towards: behind eye Pain Descriptors / Indicators: Aching Pain Frequency: Constant Pain Onset: With Activity Patients Stated Pain Goal: 4 Pain Intervention(s): Repositioned;RN made aware  See FIM for current functional status  Therapy/Group: Individual Therapy  Leroy Libman 12/29/2013, 9:31 AM

## 2013-12-29 NOTE — Progress Notes (Signed)
Physical Therapy Weekly Progress Note  Patient Details  Name: [name] [name] MRN: [id number] Date of Birth: Apr 28, 1966  Beginning of progress report period: December 21, 2013 End of progress report period: December 29, 2013  Today's Date: 12/29/2013 Time: 1105-1205 Time Calculation (min): 60 min   Pt continues to be very motivated to participate in therapy and has made slow progress since admission and has achieved 1/4 STGs, with progress towards remaining STGs. See details below. Pt currently requires supervision for bed mobility with use of hospital bed, min-mod A for transfers with RW, close(S) for ambulation with RW up to 7' and ability to propel w/c x15' with close(S)-min A. Pt continues to demonstrate limited tolerance to WB due pain in B knees and difficulty propelling manual w/c due to decreased strength and functional endurance. Pt consistently requires significantly increased time to complete functional tasks as well as require multiple rest breaks during sessions due to decreased functional endurance. Pt would greatly benefit from an extended LOS to allow pt to achieve LTGs as he demonstrates strong potential for household functional mobility at mod(I)/supervision level.   Patient continues to demonstrate the following deficits: decreased functional endurance, decreased B LE weakness, pain and decreased overall mobility therefore will continue to benefit from skilled PT intervention to enhance overall performance with activity tolerance, balance and ability to compensate for deficits to advance functional mobility.  Patient progressing toward modified long term goals. Continue plan of care.  PT Short Term Goals Week 1:  PT Short Term Goal 1 (Week 1): Pt will increase supine to edge of bed. edge of bed to supine with side rail and S.  PT Short Term Goal 1 - Progress (Week 1): Met PT Short Term Goal 2 (Week 1): Pt will increase transfer bed to chair, chair to bed with LRAD to S.  PT Short  Term Goal 2 - Progress (Week 1): Progressing toward goal PT Short Term Goal 3 (Week 1): Pt will increase ambulation with LRAD to S about 15 feet.  PT Short Term Goal 3 - Progress (Week 1): Progressing toward goal (LTG revised) PT Short Term Goal 4 (Week 1): Pt will increase w/c mobility to S about 25 to 50 feet.  PT Short Term Goal 4 - Progress (Week 1): Progressing toward goal (LTG revised) Week 2:  PT Short Term Goal 1 (Week 2): LTGs=STGs due to anticipated LOS  Skilled Therapeutic Interventions/Progress Updates:  1:1. Pt received supine in bed, ready for therapy. Focus this session on functional endurance, transfers and mobility. Emphasis on t/f sup>sit using log roll technique during t/f sup>sit w/ bed rails for increased independence and tolerance to activate appropriate muscles and not place increased pressure on abdominal wound, pt req supervision w/ min cueing for technique. Pt practiced multiple t/f sit<>stand from various surfaces, trialing various hand positions for increased comfort and independence. Pt req mod A for t/f sit<>stand from bed, min A sit<>stand x2 from w/c and min guard from tx mat x3, therapist blocking RW. Pt demonstrated increased success when L UE on handle of RW and R UE on cross bar of RW w/ elbow pushing through R handle of RW. Pt only able to tolerate amb x7' due to pain in B knees w/ RW, close(S) and w/c follow. Pt also demonstrated limited tolerance to static standing 30secondsx2 and 45secondsx1, again due to pain in B knees w/ WB.  Pt close(S)-min A for w/c propulsion 15'x2 w/ B UE. Pt left sitting in w/c w/ all needs in  reach.    Therapy Documentation Precautions:  Precautions Precautions: Fall Precaution Comments: wound vac Restrictions Weight Bearing Restrictions: No   Pain: Pain Assessment Pain Assessment: 0-10 Pain Score: 10-Worst pain ever Pain Type: Acute pain Pain Location: Knee Pain Orientation: Right;Left Pain Radiating Towards: behind eye Pain  Descriptors / Indicators: Aching Pain Frequency: Constant Pain Onset: With Activity Patients Stated Pain Goal: 4 Pain Intervention(s): Repositioned;RN made aware  See FIM for current functional status  Therapy/Group: Individual Therapy  Gilmore Laroche 12/29/2013, 12:14 PM

## 2013-12-29 NOTE — Progress Notes (Signed)
Recreational Therapy Session Note  Patient Details  Name: [name] [name] MRN: [id number] Date of Birth: May 06, 1966 Today's Date: 12/29/2013  Pain: no c/o pain Skilled Therapeutic Interventions/Progress Updates: Session focused on review of deep breathing exercises in which pt states he has been utilizing last several days & is more efficient with use.  Pt is optimistic about continued healing & recovery.  Pt discussed plans for discharge & return to activities of choice.  Therapy/Group: Individual Therapy   Desirey Keahey 12/29/2013, 12:17 PM

## 2013-12-29 NOTE — Telephone Encounter (Signed)
Mother calling back pattie [phone]

## 2013-12-29 NOTE — Progress Notes (Signed)
Occupational Therapy Session Note  Patient Details  Name: [name] [name] MRN: [id number] Date of Birth: Oct 02, 1965  Today's Date: 12/29/2013 Time: 6203-5597 Time Calculation (min): 30 min  Short Term Goals: Week 1:  OT Short Term Goal 1 (Week 1): STG=LTG  Skilled Therapeutic Interventions/Progress Updates:    Focus on sit > stand and maintaining standing balance with only 1 UE support to increase participation in self-care tasks.  Upon arrival, pt reports fatigue and sore BLE from previous treatment sessions however willing to participate.  Supine to sit at EOB mod I, engaged in sit > stand with focus on carryover of hand placement to increase independence.  Pt utilized LUE on handle of RW and RUE on cross bar on RW with elbow pushing through Rt handle of RW and after 2 unsuccessful attempts, followed by education on forward weight shifting and raising bed, pt able to complete sit > stand x4 with close supervision.  Pt simulated adjusting pants by alternating holding RW with one hand then other.  Pt even adjusted shorts x1 in standing with supervision.  Pt returned to supine in bed mod I.  Therapy Documentation Precautions:  Precautions Precautions: Fall Precaution Comments: wound vac Restrictions Weight Bearing Restrictions: No Pain: Pain Assessment Pain Assessment: 0-10 Pain Score: 9  Pain Type: Acute pain Pain Location: Knee Pain Orientation: Right;Left Pain Descriptors / Indicators: Aching Pain Frequency: Intermittent Pain Onset: Gradual Patients Stated Pain Goal: 3 Pain Intervention(s): Medication (See eMAR)  See FIM for current functional status  Therapy/Group: Individual Therapy  Simonne Come 12/29/2013, 2:54 PM

## 2013-12-29 NOTE — Progress Notes (Signed)
Pt setup on BiPAP (18/6 with 3 lpm bleed-in O2 via FFM) for night. Pt comfortable and tolerating well. RT to monitor as needed.

## 2013-12-29 NOTE — Progress Notes (Signed)
Social Work Patient ID: [name] [name], male   DOB: June 30, 1965, 48 y.o.   MRN: [id number]  Have spoken with treatment team today with all recommending extensive of stay with new target d/c 7/30 in order to meet supervision goals.  MD aware and agreeable.  Will send current notes to insurance CM and continue to work on finding a rental power w/c for pt to use at d/c.  Continue to follow.  Rasool Rommel, LCSW

## 2013-12-30 ENCOUNTER — Encounter (HOSPITAL_COMMUNITY): Payer: BC Managed Care – PPO

## 2013-12-30 ENCOUNTER — Inpatient Hospital Stay (HOSPITAL_COMMUNITY): Payer: BC Managed Care – PPO

## 2013-12-30 DIAGNOSIS — R5381 Other malaise: Secondary | ICD-10-CM

## 2013-12-30 DIAGNOSIS — D496 Neoplasm of unspecified behavior of brain: Secondary | ICD-10-CM

## 2013-12-30 DIAGNOSIS — I5033 Acute on chronic diastolic (congestive) heart failure: Secondary | ICD-10-CM

## 2013-12-30 LAB — BASIC METABOLIC PANEL
Anion gap: 10 (ref 5–15)
BUN: 27 mg/dL — ABNORMAL HIGH (ref 6–23)
CO2: 29 mEq/L (ref 19–32)
Calcium: 8.9 mg/dL (ref 8.4–10.5)
Chloride: 102 mEq/L (ref 96–112)
Creatinine, Ser: 0.98 mg/dL (ref 0.50–1.35)
GFR calc Af Amer: >90 mL/min (ref >90)
GFR calc non Af Amer: >90 mL/min (ref >90)
Glucose, Bld: 90 mg/dL (ref 70–99)
Potassium: 3.8 mEq/L (ref 3.7–5.3)
Sodium: 141 mEq/L (ref 137–147)

## 2013-12-30 LAB — MAGNESIUM: Magnesium: 2.3 mg/dL (ref 1.5–2.5)

## 2013-12-30 NOTE — Plan of Care (Signed)
Problem: RH BOWEL ELIMINATION Goal: RH STG MANAGE BOWEL WITH ASSISTANCE STG Manage Bowel with Mod I  Outcome: Progressing 1 person assist stand pivot to bedside commode  Problem: RH BLADDER ELIMINATION Goal: RH STG MANAGE BLADDER WITH ASSISTANCE STG Manage Bladder With Mod I  Outcome: Not Progressing Helper obtains, positions and empties urinal  Problem: RH PAIN MANAGEMENT Goal: RH STG PAIN MANAGED AT OR BELOW PT'S PAIN GOAL <3 on 0-10 scale with use of PO analgesics  Outcome: Not Progressing Pain not lower than 5 or 6 out of 10

## 2013-12-30 NOTE — Progress Notes (Signed)
Occupational Therapy Session Note  Patient Details  Name: [name] [name] MRN: [id number] Date of Birth: [birth year]  Today's Date: 12/30/2013  Session 1 Time: 0900-1000 Time Calculation (min): 60 min  Short Term Goals: Week 2:  OT Short Term Goal 1 (Week 2): Pt will perform toilet transfers with mod I OT Short Term Goal 2 (Week 2): Pt will perform tub bench transfers with supervision  Skilled Therapeutic Interventions/Progress Updates:    Pt resting in bed upon arrival and requested to use BSC.  Pt completed in/out bed transfers to [facility] and completed toileting tasks at supervision level.  Pt engaged in bathing and dressing tasks with sit<>stand from EOB.  Pt completed tasks at supervision level except steady A while standing to pull up pants.  Pt requires extra time to complete all tasks and requires multiple rest breaks throughout session.  Discussed energy conservation techniques and allowing adequate time at home to complete tasks with appropriate rest breaks.  Focus on activity tolerance, sit<>stand, transfers, BSC transfers, toileting, discharge planning, and safety awareness.  Therapy Documentation Precautions:  Precautions Precautions: Fall Precaution Comments: wound vac Restrictions Weight Bearing Restrictions: No   Pain: Pain Assessment Pain Assessment: 0-10 Pain Score: 8  Pain Type: Acute pain Pain Location: Head Pain Orientation: Anterior Pain Descriptors / Indicators: Aching Pain Onset: On-going Pain Intervention(s): Medication (See eMAR)  See FIM for current functional status  Therapy/Group: Individual Therapy  Session 2 Time: 1303-1330 Pt c/o 4/10 headache; RN aware Individual therapy  Pt sitting in w/c upon arrival.  Initial focus on sit<>stand from w/c and amb with RW approx 6' to simulate distance to access tub in bathroom at home.  Pt completed task at supervision level.  Pt transitioned to BUE therex with 3# weight bar - shoulder presses, biceps curls,  and trunk rotations.  Pt transferred to bed (supervision) and remained in bed until next therapy.  Focus on increased activity tolerance, functional amb with RW, transfers, and sit<>stands to increase independence with BADLs.   [name] [name] [facility] 12/30/2013, 10:57 AM

## 2013-12-30 NOTE — Progress Notes (Signed)
Physical Therapy Session Note  Patient Details  Name: [name] [name] MRN: [id number] Date of Birth: [birth year]  Today's Date: 12/30/2013 Time: 1050-1150 Time Calculation (min): 60 min  Short Term Goals: Week 2:  PT Short Term Goal 1 (Week 2): LTGs=STGs due to anticipated LOS  Skilled Therapeutic Interventions/Progress Updates:  Pt received semi-reclined in bed, ready for therapy. Co-tx this session with Rec Therapy for focus on functional transfers and general functional endurance. Pt req supervision for t/f sup>sit EOB w/ use of hospital bed as well as multiple t/f sit<>stands w/ RW from bed and w/c.   Pt propelled w/c w/ B UE forwards 20'x2 and reverse 30'x1 to target biceps and triceps. Pt req supervision for ambulation 6' from bed>w/c w/ RW and side stepping x3' to mirror bathroom setup. Pt with fair tolerance to standing ball roll activity with single UE support to target standing endurance. Pt required frequent seated rest breaks throughout session and required cues for breathing during activity. Standing tolerance extremely limited due to pain in B knees with WB as well as fatigue.  Pt left sitting in w/c at end of session w/ all needs in reach.   Therapy Documentation Precautions:  Precautions Precautions: Fall Precaution Comments: wound vac Restrictions Weight Bearing Restrictions: No Pain: Pain Assessment Pain Assessment: 0-10 Pain Score: 6  Pain Type: Acute pain Pain Location: Head Pain Orientation: Anterior Pain Descriptors / Indicators: Aching Pain Onset: On-going Pain Intervention(s): Medication (See eMAR)  See FIM for current functional status  Therapy/Group: Co-Treatment  Otis Brace S 12/30/2013, 12:21 PM

## 2013-12-30 NOTE — Telephone Encounter (Signed)
Called & spoke with patient's mother, [name] [name].  She states pt will be discharged from [facility] on 01/01/14.  Pt's mother is requesting to set up an appt to pick an [name] for pt's HST for next week.  I advised I will be out of the office next week but I will give this to our Team Leader, [name] and she can ask one of the other PCCs to contact the pt/pt's mother.  I have passed this request to [name]

## 2013-12-30 NOTE — Progress Notes (Signed)
Physical Therapy Session Note  Patient Details  Name: Slevin Gunby MRN: [id number] Date of Birth: 1965/10/28  Today's Date: 12/30/2013 Time: 1324-4010 Time Calculation (min): 30 min  Short Term Goals: Week 2:  PT Short Term Goal 1 (Week 2): LTGs=STGs due to anticipated LOS  Skilled Therapeutic Interventions/Progress Updates:  1:1. Pt received semi-reclined in bed, ready for therapy. Focus this session on functional transfers, side stepping and LE therex. Pt req overall (S) for t/f sup<>sit w/ use of hospital bed functions, t/f sit<>stand w/ RW and increased time due to pain with WB. Pt practiced sidestepping 6'x2 bed<>w/c w/ RW and close(S) to simulate bathroom setup. Pt with good tolerance to LE therex to target strength and engage pt in active rest break, exercises included 2x15 reps of: ankle pumps, LAQ and marching. Pt becomes SOB very easily, consistent cues throughout session for pursed lip breathing. Pt left semi-reclined in bed w/ all needs in reach, RN in room address wound vac.   Therapy Documentation Precautions:  Precautions Precautions: Fall Precaution Comments: wound vac Restrictions Weight Bearing Restrictions: No  Pain: Pain Assessment Pain Assessment: 0-10 Pain Score: 7  Pain Type: Acute pain Pain Location: Head Pain Orientation: Anterior Pain Descriptors / Indicators: Aching Pain Onset: On-going Pain Intervention(s): Medication (See eMAR)  See FIM for current functional status  Therapy/Group: Individual Therapy  Gilmore Laroche 12/30/2013, 4:07 PM

## 2013-12-30 NOTE — Progress Notes (Signed)
[facility] PHYSICAL MEDICINE & REHABILITATION     PROGRESS NOTE    Subjective/Complaints:  Overall feeling stronger. Pain is better controlled. Likes medication regimen. Started voltaren gel last noc Review of Systems - otherwise negative  Objective: Vital Signs: Blood pressure 135/88, pulse 74, temperature 97.8 F (36.6 C), temperature source Oral, resp. rate 18, weight 206 kg (454 lb 2.4 oz), SpO2 96.00%. No results found.  Recent Labs  12/28/13 0522  WBC 5.0  HGB 11.4*  HCT 38.0*  PLT 154    Recent Labs  12/29/13 0439 12/30/13 0530  NA 145 141  K 3.9 3.8  CL 105 102  GLUCOSE 129* 90  BUN 28* 27*  CREATININE 1.02 0.98  CALCIUM 9.1 8.9   CBG (last 3)  No results found for this basename: GLUCAP,  in the last 72 hours  Wt Readings from Last 3 Encounters:  12/30/13 206 kg (454 lb 2.4 oz)  12/20/13 199 kg (438 lb 11.5 oz)  12/20/13 199 kg (438 lb 11.5 oz)    Physical Exam:  Morbidly obese.  HENT:  Head: Normocephalic and atraumatic.  Eyes: Conjunctivae are normal. Pupils are equal, round, and reactive to light.   Neck: Normal range of motion. Neck supple.  Cardiovascular: Normal rate and regular rhythm.  No murmur heard.  Respiratory: Effort normal. No respiratory distress. He has wheezes.  GI: Soft. Bowel sounds are normal. He exhibits no distension. There is no tenderness.  Obese with VAC in place. Area is slightly red around dressing site/opsite Musculoskeletal: He exhibits no edema.  ROM better both knees Neurological: He is alert and oriented to person, place, and time.  Reasonable insight and awareness.  Skin: Skin is warm and dry.  Psychiatric: anxious   Assessment/Plan: 1. Functional deficits secondary to deconditioning related to multiple medical issues which require 3+ hours per day of interdisciplinary therapy in a comprehensive inpatient rehab setting. Physiatrist is providing close team supervision and 24 hour management of active medical  problems listed below. Physiatrist and rehab team continue to assess barriers to discharge/monitor patient progress toward functional and medical goals.    FIM: FIM - Bathing Bathing Steps Patient Completed: Chest;Right Arm;Left Arm;Abdomen;Front perineal area;Buttocks;Right upper leg;Left upper leg;Right lower leg (including foot);Left lower leg (including foot) Bathing: 4: Steadying assist  FIM - Upper Body Dressing/Undressing Upper body dressing/undressing steps patient completed: Thread/unthread right sleeve of pullover shirt/dresss;Thread/unthread left sleeve of pullover shirt/dress;Put head through opening of pull over shirt/dress;Pull shirt over trunk Upper body dressing/undressing: 5: Set-up assist to: Obtain clothing/put away FIM - Lower Body Dressing/Undressing Lower body dressing/undressing steps patient completed: Thread/unthread right pants leg;Thread/unthread left pants leg;Don/Doff right sock;Don/Doff left sock Lower body dressing/undressing: 4: Min-Patient completed 75 plus % of tasks  FIM - Toileting Toileting steps completed by patient: Performs perineal hygiene Toileting Assistive Devices: Grab bar or rail for support Toileting: 2: Max-Patient completed 1 of 3 steps  FIM - Radio producer Devices: Recruitment consultant Transfers: 0-Activity did not occur  FIM - Control and instrumentation engineer Devices: Arm rests;Walker;Bed rails Bed/Chair Transfer: 5: Supine > Sit: Supervision (verbal cues/safety issues);3: Bed > Chair or W/C: Mod A (lift or lower assist);4: Chair or W/C > Bed: Min A (steadying Pt. > 75%)  FIM - Locomotion: Wheelchair Distance: 100 ft Locomotion: Wheelchair: 1: Travels less than 50 ft with supervision, cueing or coaxing FIM - Locomotion: Ambulation Locomotion: Ambulation Assistive Devices: Administrator Ambulation/Gait Assistance: 5: Supervision Locomotion: Ambulation: 1: Travels less  than 50 ft with  supervision/safety issues  Comprehension Comprehension Mode: Auditory Comprehension: 7-Follows complex conversation/direction: With no assist  Expression Expression Mode: Verbal Expression: 7-Expresses complex ideas: With no assist  Social Interaction Social Interaction: 6-Interacts appropriately with others with medication or extra time (anti-anxiety, antidepressant).  Problem Solving Problem Solving: 7-Solves complex problems: Recognizes & self-corrects  Memory Memory: 7-Complete Independence: No helper Medical Problem List and Plan:  1. Functional deficits secondary to deconditioning related to medical issues, pituitary tumor resection   -MRI with debulking---some residual tumor  -tumor is NON-MALIGNANT. Rad-onc consult requested by NS (pending today) 2. DVT Prophylaxis/Anticoagulation: Pharmaceutical: Lovenox  3. Pain Management: robaxin, percocet, add dilaudid for more severe/acute pain  -topamax 50mg  bid -arthrotec (using home supply)---he is trying voltaren as well to see if this helps  -ms contin helpful 4. Mood: xanax prn, celexa, egosupport  5. Neuropsych: This patient is capable of making decisions on his own behalf.  6. 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 Wound care---vac per woc----area stable   -dc diflucan 8. Enstage OA of bilateral knees--arthrotec , trial analgesic cream -pt followed by Dr. Alvan Dame. Recent xrays done  -was scheduled for synvisc. Probably just needs to wait until he is home and through these medical issues   LOS (Days) 10 A FACE TO FACE EVALUATION WAS PERFORMED  Bobbyjoe Pabst T 12/30/2013 8:10 AM

## 2013-12-30 NOTE — Progress Notes (Signed)
Patient BiPAP already set up. Says he will place himself on when ready. 3L bled in. RT will continue to monitor.

## 2013-12-30 NOTE — Plan of Care (Signed)
Problem: RH Bed to Chair Transfers Goal: LTG Patient will perform bed/chair transfers w/assist (PT) LTG: Patient will perform bed/chair transfers with assistance, with/without cues (PT).  Upgraded due to decreased need for cues for safe completion.

## 2013-12-30 NOTE — Consult Note (Signed)
Radiation Oncology         [phone]) [phone] ________________________________  Initial inpatient Consultation  Name: [name] [name] MRN: [id number]  Date: 12/06/2013  DOB: [birth year]  EX:BMWUX,LKGMWN DAVIDSON, MD  No ref. provider found   REFERRING PHYSICIAN: No ref. 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 male who presented on 12/06/13 with a 3 day history of occipital headache as severe as 10/10 at times.  He had some associated nausea and blurred vision.  Head CT on 12/06/13 demonstrated a sellar and suprasellar mass with invasion and expansion of the right cavernous sinus and extension into the sphenoid sinus.    Brain MRI on 12/07/13 further characterized the mass as a 3.7 x 4.1 x 3.4 cm mass with the epicenter in the region of the sella/clivus. There was invasion of the cavernous sinus on the right, surrounding the carotid artery.  Early invasion on the left is possible. The component extending into the suprasellar region measures 14 mm in diameter and indents the undersurface of the optic chiasm. The lesion shows variable enhancement.     Clinically, he was noted to have ptosis in the right with enlarged pupil.  He suffers with obesity and has had a chronic abdominal wound which was evaluated by infectious disease.  Endocrine work-up on 7/4 showed:  Results for KOHL, POLINSKY (MRN [id number]) as of 12/30/2013 15:22  Ref. Range 12/06/2013 12:10  Cortisol, Plasma No range found 31.2  Growth Hormone Latest Range: 0.00-3.00 ng/mL 0.39  LH Latest Range: 1.5-9.3 mIU/mL <0.1 (L)  FSH Latest Range: 1.4-18.1 mIU/mL <0.3 (L)  Prolactin Latest Range: 2.1-17.1 ng/mL 11.1  Somatomedin C Latest Range: 55-213 ng/mL 121  TSH Latest Range: 0.350-4.500 uIU/mL 0.137 (L)  Free T4 Latest Range: 0.80-1.80 ng/dL 0.81  Results for LORIMER, TIBERIO (MRN [id number]) as  of 12/30/2013 15:22  Ref. Range 12/18/2013 12:00  Sex Hormone Binding Latest Range: 13-71 nmol/L 18  Testosterone Latest Range: 300-890 ng/dL 69 (L)  Testosterone Free Latest Range: 47.0-244.0 pg/mL 17.1 (L)    The patient proceeded to transphenoidal resection of the tumor on 12/10/13 with Dr. Joya Salm.    Pathology confirmed pituitary adenoma, confirmed at [facility].  Post-op MRI showed complete removal of the suprasellar tumor component with residual invasion of the cavernous sinuses and clivus.    Post-operatively, the patient is recovering from surgery with no change in presenting cranial nerve dysfunction.  PREVIOUS RADIATION THERAPY: No  PAST MEDICAL HISTORY:  has a past medical history of Sleep apnea; Allergic rhinitis; Diastolic CHF; Gout; Nephrolithiasis; Peripheral neuropathy; and Hypertension.    PAST SURGICAL HISTORY: Past Surgical History  Procedure Laterality Date  . Sigmoid colonoscopy    . Hernia repair    . Abdominal surgery      for cyst with MRSA  . Craniotomy N/A 12/10/2013    Procedure: Transpheniodal resection of Pituitary tumor with Dr. Radene Journey for approach;  Surgeon: Floyce Stakes, MD;  Location: [facility];  Service: Neurosurgery;  Laterality: N/A;  Transpheniodal resection of Pituitary tumor with Dr. Radene Journey for approach  . Transnasal approach N/A 12/10/2013    Procedure: TRANSNASAL APPROACH;  Surgeon: Rozetta Nunnery, MD;  Location: [facility] NEURO ORS;  Service: ENT;  Laterality: N/A;    FAMILY HISTORY: family history includes Gout in his brother and father; Hypertension in his father and mother; Hypothyroidism in his father.  SOCIAL HISTORY:  reports that he has never smoked. He has never used smokeless tobacco. He reports that he does not drink alcohol or use illicit drugs.  ALLERGIES: Penicillins and Morphine and related  MEDICATIONS:  No current facility-administered medications for this encounter.   No current outpatient  prescriptions on file.   Facility-Administered Medications Ordered in Other Encounters  Medication Dose Route Frequency Provider Last Rate Last Dose  . acetaminophen (TYLENOL) tablet 325-650 mg  325-650 mg Oral Q4H PRN Bary Leriche, PA-C   650 mg at 12/21/13 0606  . ALPRAZolam Duanne Moron) tablet 0.5 mg  0.5 mg Oral Q4H PRN Meredith Staggers, MD   0.5 mg at 12/29/13 2158  . alum & mag hydroxide-simeth (MAALOX/MYLANTA) 200-200-20 MG/5ML suspension 30 mL  30 mL Oral Q4H PRN Bary Leriche, PA-C      . bisacodyl (DULCOLAX) suppository 10 mg  10 mg Rectal Daily PRN Bary Leriche, PA-C      . citalopram (CELEXA) tablet 40 mg  40 mg Oral Daily Bary Leriche, PA-C   40 mg at 12/30/13 0816  . diclofenac sodium (VOLTAREN) 1 % transdermal gel 4 g  4 g Topical QID Meredith Staggers, MD   4 g at 12/30/13 1200  . Diclofenac-Misoprostol 75-0.2 MG TBEC 1 tablet  1 tablet Oral BID Bary Leriche, PA-C   1 tablet at 12/30/13 0830  . enoxaparin (LOVENOX) injection 100 mg  100 mg Subcutaneous Q24H Pamela S Love, PA-C   100 mg at 12/30/13 1411  . feeding supplement (PRO-STAT SUGAR FREE 64) liquid 30 mL  30 mL Oral TID WC Ivan Anchors Love, PA-C   30 mL at 12/30/13 1159  . furosemide (LASIX) tablet 40 mg  40 mg Oral Daily Bary Leriche, PA-C   40 mg at 12/30/13 0817  . guaiFENesin-dextromethorphan (ROBITUSSIN DM) 100-10 MG/5ML syrup 5-10 mL  5-10 mL Oral Q6H PRN Bary Leriche, PA-C      . HYDROmorphone (DILAUDID) tablet 2 mg  2 mg Oral Q3H PRN Bary Leriche, PA-C   2 mg at 12/29/13 1228  . lactulose (CHRONULAC) 10 GM/15ML solution 30 g  30 g Oral BID PRN Bary Leriche, PA-C      . levothyroxine (SYNTHROID, LEVOTHROID) tablet 50 mcg  50 mcg Oral QAC breakfast Bary Leriche, PA-C   50 mcg at 12/30/13 0160  . lidocaine (XYLOCAINE) 4 % external solution   Topical Daily PRN Bary Leriche, PA-C      . methocarbamol (ROBAXIN) tablet 500 mg  500 mg Oral Q6H PRN Bary Leriche, PA-C   500 mg at 12/26/13 2147  . multivitamin with minerals  tablet 1 tablet  1 tablet Oral Daily Bary Leriche, PA-C   1 tablet at 12/30/13 0817  . MUSCLE RUB CREA   Topical BID Charlett Blake, MD      . nutrition supplement (JUVEN) (JUVEN) powder packet 1 packet  1 packet Oral BID BM Bary Leriche, PA-C   1 packet at 12/30/13 1411  . ondansetron (ZOFRAN) tablet 4 mg  4 mg Oral Q6H PRN Bary Leriche, PA-C   4 mg at 12/21/13 1137   Or  . ondansetron (ZOFRAN) injection 4 mg  4 mg Intravenous Q6H PRN Bary Leriche, PA-C      . OxyCODONE (OXYCONTIN) 12 hr tablet 20 mg  20 mg Oral Q12H Meredith Staggers, MD   20 mg at 12/30/13 0816  . oxyCODONE-acetaminophen (PERCOCET/ROXICET) 5-325 MG  per tablet 1-2 tablet  1-2 tablet Oral Q4H PRN Bary Leriche, PA-C   2 tablet at 12/30/13 1411  . polyethylene glycol (MIRALAX / GLYCOLAX) packet 17 g  17 g Oral BID Bary Leriche, PA-C   17 g at 12/23/13 2009  . sodium chloride 0.9 % injection 10-40 mL  10-40 mL Intracatheter PRN Charlett Blake, MD   10 mL at 12/29/13 0439  . [START ON 01/02/2014] testosterone cypionate (DEPOTESTOTERONE CYPIONATE) injection 100 mg  100 mg Intramuscular Q14 Days Pamela S Love, PA-C      . topiramate (TOPAMAX) tablet 50 mg  50 mg Oral BID Meredith Staggers, MD   50 mg at 12/30/13 0817  . traZODone (DESYREL) tablet 25-50 mg  25-50 mg Oral QHS PRN Bary Leriche, PA-C        REVIEW OF SYSTEMS:  A 15 point review of systems is documented in the electronic medical record. This was obtained by the nursing staff. However, I reviewed this with the patient to discuss relevant findings and make appropriate changes.  Pertinent items are noted in HPI.   PHYSICAL EXAM:  height is 5\' 11"  (1.803 m) and weight is 438 lb 11.5 oz (199 kg). His oral temperature is 97.6 F (36.4 C). His blood pressure is 139/88 and his pulse is 79. His respiration is 16 and oxygen saturation is 99%.    Pre-op exam on 7/5 by Dr. Doy Mince showed:   BMI of 58 and  Neurologic Exam:  Awake and alert. Oriented. Follows commands.  Speech fluent without evidence of aphasia.  Cranial Nerves:  II: Discs flat bilaterally; Visual fields grossly normal, left pupil 43mm and reactive, right pupil 53mm and unreactive  III,IV, VI: right ptosis, Decreased upward, downward and medial motion with the right eye, left eye now with restricted lateral gaze, not as briskly reactive  V,VII: smile symmetric, facial light touch sensation normal bilaterally  VIII: hearing normal bilaterally  IX,X: gag reflex present  XI: bilateral shoulder shrug  XII: rightward tongue extension  Motor:  Right : Upper extremity 5/5 Left: Upper extremity 5/5  Lower extremity 5/5 Lower extremity 5/5  Tone and bulk:normal tone throughout; no atrophy noted  Sensory: Pinprick and light touch decreased in the left upper and lower extremity  Deep Tendon Reflexes: 2+ in the upper extremities and absent in the lower extremities  Plantars:  Right: mute Left: mute  Cerebellar:  normal finger-to-nose  Gait: Unable to test  CV: pulses palpable throughout   His preoperative neurologic exam suggests 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  Postoperatively, on my exam today, the patient's extraocular muscles have significantly improved with no objective deficit. The patient reports some subjective diplopia when gazing medially on the right from the right eye suggesting some residual cranial nerve 3 dysfunction    KPS = 40  100 - Normal; no complaints; no evidence of disease. 90   - Able to carry on normal activity; minor signs or symptoms of disease. 80   - Normal activity with effort; some signs or symptoms of disease. 35   - Cares for self; unable to carry on normal activity or to do active work. 60   - Requires occasional assistance, but is able to care for most of his personal needs. 50   - Requires considerable assistance and frequent medical care. 56   - Disabled; requires  special care and assistance. 30   -  Severely disabled; hospital admission is indicated although death not imminent. 35   - Very sick; hospital admission necessary; active supportive treatment necessary. 10   - Moribund; fatal processes progressing rapidly. 0     - Dead  Karnofsky DA, Abelmann [place], Craver LS and Burchenal [facility] [phone]) The use of the nitrogen mustards in the palliative treatment of carcinoma: with particular reference to bronchogenic carcinoma Cancer 1 634-56  LABORATORY DATA:  Lab Results  Component Value Date   WBC 5.0 12/28/2013   HGB 11.4* 12/28/2013   HCT 38.0* 12/28/2013   MCV 92.0 12/28/2013   PLT 154 12/28/2013   Lab Results  Component Value Date   NA 141 12/30/2013   K 3.8 12/30/2013   CL 102 12/30/2013   CO2 29 12/30/2013   Lab Results  Component Value Date   ALT 35 12/06/2013   AST 23 12/06/2013   ALKPHOS 100 12/06/2013   BILITOT 0.4 12/06/2013   RADIOGRAPHY: Dg Skull 1-3 Views  12/10/2013   CLINICAL DATA:  Pituitary tumor removal.  EXAM: SKULL - 1-3 VIEW; DG C-ARM 61-120 MIN  COMPARISON:  Brain MRI 12/07/2013  FINDINGS: Fluoroscopic images of the face and skull were obtained. Four intraoperative images were obtained. Surgical instrument extends into the sella turcica from a transsphenoidal approach. There is a small curvilinear density in the sella turcica on the first three images but this is not present on the final image.  IMPRESSION: Trans-sphenoid resection of the pituitary tumor.   Electronically Signed   By: Markus Daft M.D.   On: 12/10/2013 20:07   Ct Head W Wo Contrast  12/06/2013   CLINICAL DATA:  Pituitary tumor. Progressive symptoms. Weakness and dizziness.  EXAM: CT HEAD WITHOUT AND WITH CONTRAST  TECHNIQUE: Contiguous axial images were obtained from the base of the skull through the vertex without and with intravenous contrast  CONTRAST:  80 mL Omnipaque 300  COMPARISON:  None.  FINDINGS: A sellar and suprasellar mass lesion is confirmed. Reformatted images were  attempted but of low diagnostic value given the thickness of the acquisition.  The right cavernous sinus is expanded with diffuse enhancement. The suprasellar component extends superiorly. The right-sided lesion with extension into the cavernous sinus measures 2.1 x 2.7 cm on the axial images. The suprasellar component measures 1.1 x 1.0 cm.  The suprasellar brain is otherwise unremarkable. No acute infarct or hemorrhage is evident.  Soft tissue fills the sphenoid sinus, presumably related to the same process. The paranasal sinuses and mastoid air cells are otherwise clear.  IMPRESSION: 1. Prominent scratch the large sellar and suprasellar mass lesion with extension into the right cavernous sinus and superior extension beyond the diaphragm sella. There is osseous destruction with extension into the sphenoid sinus. Includes a pituitary macroadenoma. A craniopharyngioma is considered less likely without significant calcifications. A meningioma is also considered. Aneurysm is considered but less likely. Metastatic disease is also considered. Recommend MRI of the brain and pituitary for further evaluation. 2. The suprasellar brain is unremarkable.   Electronically Signed   By: Lawrence Santiago M.D.   On: 12/06/2013 10:31   [name] UD Contrast  12/24/2013   CLINICAL DATA:  Headache. Blurred vision. Reduced is. Previous pituitary tumor resection.  EXAM: MRI HEAD WITHOUT AND WITH CONTRAST  TECHNIQUE: Multiplanar, multiecho pulse sequences of the brain and surrounding structures were obtained without and with intravenous contrast.  CONTRAST:  2mL MULTIHANCE GADOBENATE DIMEGLUMINE 529 MG/ML IV SOLN  COMPARISON:  12/07/2013  FINDINGS: There  is no acute or subacute infarction. The brainstem cerebellum are normal. Cerebral hemispheres are normal. No hydrocephalus or extra-axial collection. No evidence of intracranial hemorrhage.  The patient has had transsphenoidal surgery for debulking of the previously seen large an  invasive sellar region mass. Most of the tissue the in the central sella in the tissue extending into the suprasellar region has been removed. There continues to be abnormal signal within the clivus consistent with bone invasion. 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 There is probably early invasion of the left cavernous sinus. Packing material is present in the sphenoid sinus.  IMPRESSION: Interval transsphenoidal surgery. Considerable debulking of the invasive mass with the epicenter in the region of the sella. Complete removal of the suprasellar component. 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   Electronically Signed   By: Nelson Chimes M.D.   On: 12/24/2013 18:04   [name] MH Contrast  12/07/2013   CLINICAL DATA:  pituitary tumor  EXAM: MRI HEAD WITHOUT AND WITH CONTRAST  TECHNIQUE: Multiplanar, multiecho pulse sequences of the brain and surrounding structures were obtained without and with intravenous contrast.  CONTRAST:  40mL MULTIHANCE GADOBENATE DIMEGLUMINE 529 MG/ML IV SOLN  COMPARISON:  Head CT 12/06/2013  FINDINGS: The study is degraded by considerable motion. Patient's body habitus prevents use of the best coil configuration.  Diffusion imaging does not show any acute or subacute infarction. There are mild chronic appearing small vessel changes of the cerebral hemispheric white matter. No cortical insult. No intra-axial mass, hemorrhage, hydrocephalus or extra-axial fluid collection.  There is an invasive mass involving the sella, clivus, sphenoid sinus and suprasellar region. This measures 3.7 cm cephalocaudal, 4.1 cm front to back, and 3.4 cm right to left. There is invasion of the cavernous sinus on the right, surrounding the carotid artery. Early invasion on the left is possible. The component extending into the suprasellar region measures 14 mm in diameter and indents the  undersurface of the optic chiasm. The lesion shows variable enhancement. I do not see cystic change or evidence of calcification by MR.  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 Differential diagnosis does include metastatic disease, chordoma and craniopharyngioma.  IMPRESSION: 3.7 x 4.1 x 3.4 cm mass with the epicenter in the region of the sella/clivus. There is invasion of the bony clivus, invasion of the sphenoid sinus and extension into the suprasellar region. There is definite cavernous sinus invasion on the right and possible or early cavernous sinus invasion on the left. 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 The differential diagnosis does include metastatic disease, chordoma and craniopharyngioma.   Electronically Signed   By: Nelson Chimes M.D.   On: 12/07/2013 16:10   Dg Chest Port 1 View  12/11/2013   CLINICAL DATA:  Status post intubation.  EXAM: PORTABLE CHEST - 1 VIEW  COMPARISON:  Single view of the chest 12/09/2013.  FINDINGS: Endotracheal tube is in place with the tip in good position at the level the clavicular heads. There is cardiomegaly and vascular congestion without frank edema. Subsegmental basilar atelectasis is noted. No pneumothorax.  IMPRESSION: ETT oblique projects in good position.  Cardiomegaly and vascular congestion.   Electronically Signed   By: Inge Rise M.D.   On: 12/11/2013 07:41   Dg Chest Port 1 View  12/09/2013   CLINICAL DATA:  Shortness of Breath  EXAM: PORTABLE CHEST -  1 VIEW  COMPARISON:  November 08, 2013  FINDINGS: Central catheter tip is at the cavoatrial junction. No pneumothorax. There is mild right base atelectatic change. Lungs are otherwise clear. Heart is mildly enlarged but stable. The pulmonary vascularity is within normal limits. No adenopathy.  IMPRESSION: No frank edema or consolidation. Mild right base atelectasis. Stable cardiac enlargement. No pneumothorax.   Electronically Signed   By: Lowella Grip M.D.   On: 12/09/2013 07:32   Dg Chest Port 1 View  12/08/2013   CLINICAL DATA:  PICC line placement.  EXAM: PORTABLE CHEST - 1 VIEW  COMPARISON:  12/07/2013  FINDINGS: Interval placement of a right PICC catheter with tip over the low SVC region. No pneumothorax. Shallow inspiration with elevation of the right hemidiaphragm. Atelectasis in the lung bases. Cardiac enlargement without significant vascular congestion.  IMPRESSION: Right PICC catheter tip overlies the lower SVC.  No pneumothorax.   Electronically Signed   By: Lucienne Capers M.D.   On: 12/08/2013 21:39   Dg Chest Port 1 View  12/07/2013   CLINICAL DATA:  Assess airspace disease  EXAM: PORTABLE CHEST - 1 VIEW  COMPARISON:  Portable chest x-ray of December 06, 2013  FINDINGS: The patient remains rotated. The lungs are reasonably well inflated. The interstitial markings are less conspicuous today. The pulmonary vascularity remains engorged. The cardiopericardial silhouette remains enlarged. No definite pleural effusion is demonstrated. There is persistent elevation of the right hemidiaphragm.  IMPRESSION: There has been slight interval improvement in the appearance of the lungs consistent with resolving interstitial edema.   Electronically Signed   By: David  [place]   On: 12/07/2013 07:24   Dg Chest Port 1 View  12/06/2013   CLINICAL DATA:  Severe shortness of breath.  EXAM: PORTABLE CHEST - 1 VIEW  COMPARISON:  10/06/2013.  FINDINGS: Significant patient rotation to the right. No gross change in enlargement of the cardiac silhouette. The lungs are grossly clear. The pulmonary vasculature and interstitial markings remain prominent. The visualized bones are unremarkable.  IMPRESSION: Stable cardiomegaly, pulmonary vascular congestion and mild chronic interstitial lung disease.   Electronically Signed   By: Enrique Sack M.D.   On: 12/06/2013 11:32   Dg C-arm 1-60 Min  12/10/2013   CLINICAL DATA:  Pituitary tumor removal.  EXAM: SKULL - 1-3 VIEW;  DG C-ARM 61-120 MIN  COMPARISON:  Brain MRI 12/07/2013  FINDINGS: Fluoroscopic images of the face and skull were obtained. Four intraoperative images were obtained. Surgical instrument extends into the sella turcica from a transsphenoidal approach. There is a small curvilinear density in the sella turcica on the first three images but this is not present on the final image.  IMPRESSION: Trans-sphenoid resection of the pituitary tumor.   Electronically Signed   By: Markus Daft M.D.   On: 12/10/2013 20:07      IMPRESSION: This patient is a very nice 48 yo gentleman s/p transphenoidal de-bulking of a 4.1 cm pituitary adenoma with invasion of the bilateral cavernous sinuses and resulting in cranial nerve symptoms producing extraocular muscle dysfunction.  This patient may benefit from adjuvant radiotherapy to help prevent  recurrence of cranial nerve dysfunction and preserve existing vision.  Stereotactic radiosurgery in a single fraction can be considered with smaller pituitary adenomas away from the optic nerves, but, the proximity of the optic nerves in this case  requires conventionally fractionated conformal radiotherapy.  His overall weight of 454 lbs exceeds the table tolerance of  available radiation treatment units.  PLAN:  Today, I talked to the patient and family about the findings and work-up thus far.  We discussed the natural history of large non-secreting pituitary adenomas and general treatment, highlighting the role or radiotherapy in the management.  We discussed the available radiation techniques, and focused on the details of logistics and delivery.  We reviewed the anticipated acute and late sequelae associated with radiation in this setting.  The patient was encouraged to ask questions that I answered to the best of my ability.   I would recommend adjuvant radiotherapy for this patient following significant weight loss to allow treatment.  I spent 60 minutes minutes face to face with the  patient and more than 50% of that time was spent in counseling and/or coordination of care.  I will coordinate follow-up visits monthly to monitor weight and potentially offer radiotherapy when the patient's weight can be accommodated by the treatment machine table.   ------------------------------------------------  Sheral Apley. Tammi Klippel, M.D.

## 2013-12-30 NOTE — Progress Notes (Signed)
Recreational Therapy Session Note  Patient Details  Name: Nichollas Perusse MRN: [id number] Date of Birth: Jan 30, 1966 Today's Date: 12/30/2013  Pain: no c/o Skilled Therapeutic Interventions/Progress Updates: session focused on activity tolerance, sit-stands, standing balance/ tolerance uisng RW with intermittent 1UE support with close supervision.   Pt required frequent rest breaks due to low activity tolerance & min instructional cues for breathing technique when physically challenged.  Therapy/Group: Co-Treatment   Deborha Moseley 12/30/2013, 11:58 AM

## 2013-12-30 NOTE — Telephone Encounter (Signed)
Pt's mother is calling back.  Would like to go ahead & schedule the HST.  Pt's mother can be reached at [phone].  Satira Anis

## 2013-12-31 ENCOUNTER — Inpatient Hospital Stay (HOSPITAL_COMMUNITY): Payer: BC Managed Care – PPO

## 2013-12-31 ENCOUNTER — Encounter (HOSPITAL_COMMUNITY): Payer: BC Managed Care – PPO

## 2013-12-31 ENCOUNTER — Telehealth: Payer: Self-pay | Admitting: Pulmonary Disease

## 2013-12-31 ENCOUNTER — Inpatient Hospital Stay (HOSPITAL_COMMUNITY): Payer: BC Managed Care – PPO | Admitting: *Deleted

## 2013-12-31 DIAGNOSIS — G4733 Obstructive sleep apnea (adult) (pediatric): Secondary | ICD-10-CM

## 2013-12-31 LAB — MAGNESIUM: Magnesium: 2.3 mg/dL (ref 1.5–2.5)

## 2013-12-31 LAB — BASIC METABOLIC PANEL
Anion gap: 11 (ref 5–15)
BUN: 26 mg/dL — ABNORMAL HIGH (ref 6–23)
CO2: 30 mEq/L (ref 19–32)
Calcium: 8.8 mg/dL (ref 8.4–10.5)
Chloride: 104 mEq/L (ref 96–112)
Creatinine, Ser: 1.08 mg/dL (ref 0.50–1.35)
GFR calc Af Amer: >90 mL/min (ref >90)
GFR calc non Af Amer: 78 mL/min — ABNORMAL LOW (ref >90)
Glucose, Bld: 95 mg/dL (ref 70–99)
Potassium: 3.7 mEq/L (ref 3.7–5.3)
Sodium: 145 mEq/L (ref 137–147)

## 2013-12-31 NOTE — Consult Note (Signed)
NEUROCOGNITIVE [place]   Mr. Einar Feggins is a 48 year old man, who was seen for a brief neurocognitive status examination in the setting of pituitary tumor, to evaluate his emotional state and mental status.  According to his medical record, he was admitted on 12/06/13 with 2 day history of headache, blurry vision, hematemesis, and hypercarbic respiratory failure.  CT of his head revealed prominent, large sellar and suprasellar mass lesion with extension into the right cavernous sinus.  During his stay, he had issues with anxiety requiring BIPAP for labored breathing.  He underwent transphenoidal resection of pituitary tumor on 12/10/13 and was extubated on 12/11/13.    Emotional Functioning:  During the clinical interview, Mr. 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  Significant time was spent in the session identifying coping techniques that he has used to cope with prior stressors.  Some of the main techniques that he identified included: prayer, deep breathing, meditation, talking with his mother, and "googling" information about problems he was experiencing.  He commented that he has purposefully not searched the internet for information on brain tumors, as he does not want to become worried unless his tumor is confirmed to be "an aggressive carcinoma."  Also, he stated that he has elected not to discuss his anxiety about his current medical situation with his mother, as he does not want to burden her.  We discussed the ramifications of eliminating these two main coping techniques during this time and how not using  some of his main coping strategies may be contributing to or causing his uncontrolled anxious mood.  Mr. Standage denied major symptoms of depression, including suicidal ideation.  His responses to a self-report measure of mood symptoms were suggestive of the presence of clinically significant depression, at a mild level.     Mental Status:  Mr. Arnold total score on an overall measure of mental status was not suggestive of the presence of significant cognitive disruption at this time (MMSE-2 brief = 16/16).  Subjectively, he denied noticing cognitive changes.    Impressions and Recommendations:  Mr. Mantell overall neurocognitive profile was not suggestive of marked cognitive impairment at this time.  From an emotional standpoint, he seems to be experiencing significant anxiety, as well as mild depressed mood.  As his medications were recently adjusted to help reduce anxiety and Mr. Savant stated that the medications seemed to be helping, no additional medication adjustments are recommended.  Behaviorally, it was suggested that he consider using all coping strategies that he has used in the past to deal with stressful situations.  He was receptive to this idea and also planned to continue utilizing prayer, deep breathing, and meditation to help cope.  Continued support through social work would be beneficial during his stay on the unit and a follow-up consult through neuropsychology could be requested, should his mood worsen significantly.  He may also benefit from treatment in individual psychotherapy post-discharge and information on providers in his area should be included with his discharge paperwork.    DIAGNOSES: Neoplasm of brain Unspecified anxiety disorder Adjustment disorder with depressed mood  Marlane Hatcher, Psy.D.  Clinical  Neuropsychologist

## 2013-12-31 NOTE — Progress Notes (Signed)
SATURATION QUALIFICATIONS: (This note is used to comply with regulatory documentation for home oxygen)  Patient Saturations on Room Air at Rest = 88%  Patient Saturations on Room Air while Ambulating = %  Patient Saturations on 3 Liters of oxygen while Ambulating = 95%  Please briefly explain why patient needs home oxygen:Patient has CHF

## 2013-12-31 NOTE — Progress Notes (Signed)
Occupational Therapy Discharge Summary  Patient Details  Name: [name] [name] MRN: [id number] Date of Birth: 01/31/1966  Today's Date: 12/31/2013   Patient has met 8 of 8 long term goals due to improved activity tolerance, improved balance, postural control and ability to compensate for deficits.  Pt made steady progress with BADLs during this admission.  Pt completes bathing and dressing tasks seated EOB with sit<>stand to pull up pants.  Pt is mod I for stand-step-pivot transfers to [facility] and is able to complete all toileting tasks at mod I level.  Pt has is able to perform tub bench transfers at mod I level and is able to amb approx 10' to access bathroom after w/c positioned at bathroom door entrance. Pt uses AE appropriately to assist with completing bathing and dressing tasks at mod I level.Patient to discharge at overall Modified Independent level.  Patient's care partner is independent to provide the necessary assistance at discharge, pt lives with mother but due to reaching mod I with all self-care goals does not require physical assistance from her at this time.   Recommendation:  Patient will benefit from ongoing skilled OT services in home health setting to continue to advance functional skills in the area of BADL and Reduce care partner burden.  Equipment: No equipment provided Pt owns BSC and tub bench  Reasons for discharge: treatment goals met and discharge from hospital  Patient/family agrees with progress made and goals achieved: Yes  OT Discharge   Pain Pain Assessment Pain Assessment: 0-10 Pain Score: 8  Pain Type: Acute pain Pain Location: Knee Pain Orientation: Right;Left Pain Descriptors / Indicators: Aching Pain Frequency: Intermittent Pain Onset: On-going Patients Stated Pain Goal: 2 Pain Intervention(s): Medication (See eMAR) ADL  See FIM Vision/Perception  Vision- History Baseline Vision/History: Wears glasses Wears Glasses: At all times Patient  Visual Report: Diplopia;Other (comment) (diplopia in lower left quadrant) Vision- Assessment Ocular Range of Motion: Within Functional Limits Alignment/Gaze Preference: Within Defined Limits Tracking/Visual Pursuits: Able to track stimulus in all quads without difficulty Saccades: Within functional limits Convergence: Within functional limits Visual Fields: No apparent deficits Diplopia Assessment: Objects split side to side;Disappears with one eye closed  Cognition Overall Cognitive Status: Within Functional Limits for tasks assessed Arousal/Alertness: Awake/alert Orientation Level: Oriented X4 Attention: Alternating Alternating Attention: Appears intact Memory: Appears intact Awareness: Appears intact Problem Solving: Appears intact Safety/Judgment: Appears intact Sensation Sensation Light Touch: Appears Intact Hot/Cold: Appears Intact Proprioception: Appears Intact Additional Comments: when testing sensation, pt reports ability to feel certain places on B LE better than others, no specific pattern Coordination Gross Motor Movements are Fluid and Coordinated: Yes Coordination and Movement Description: imppaired due to pain in B knees Motor  Motor Motor: Within Functional Limits   Trunk/Postural Assessment  Cervical Assessment Cervical Assessment: Within Functional Limits Thoracic Assessment Thoracic Assessment: Within Functional Limits Lumbar Assessment Lumbar Assessment: Within Functional Limits Postural Control Postural Control: Within Functional Limits  Balance Balance Balance Assessed: Yes Static Sitting Balance Static Sitting - Balance Support: No upper extremity supported;Feet unsupported Static Sitting - Level of Assistance: 7: Independent Dynamic Sitting Balance Dynamic Sitting - Balance Support: Feet supported Dynamic Sitting - Level of Assistance: 6: Modified independent (Device/Increase time) Dynamic Sitting - Balance Activities: Forward lean/weight  shifting;Reaching for objects;Reaching across midline Static Standing Balance Static Standing - Balance Support: Bilateral upper extremity supported Static Standing - Level of Assistance: 6: Modified independent (Device/Increase time) Dynamic Standing Balance Dynamic Standing - Balance Support: Bilateral upper extremity supported Dynamic Standing -  Level of Assistance: 6: Modified independent (Device/Increase time) Dynamic Standing - Balance Activities: Lateral lean/weight shifting;Forward lean/weight shifting;Reaching for objects Extremity/Trunk Assessment RUE Assessment RUE Assessment: Within Functional Limits LUE Assessment LUE Assessment: Within Functional Limits  See FIM for current functional status  Leroy Libman 12/31/2013, 1:51 PM

## 2013-12-31 NOTE — Patient Care Conference (Signed)
Inpatient RehabilitationTeam Conference and Plan of Care Update Date: 12/30/2013   Time: 3:00 PM    Patient Name: [name] [name]      Medical Record Number: [id number]  Date of Birth: 03/21/1966 Sex: Male         Room/Bed: 4W12C/4W12C-01 Payor Info: Payor: [place] / Plan: BCBS PPO OUT OF STATE / Product Type: *No Product type* /    Admitting Diagnosis: pituitary tumor  Admit Date/Time:  12/20/2013  2:08 PM Admission Comments: No comment available   Primary Diagnosis:  <principal problem not specified> Principal Problem: <principal problem not specified>  Patient Active Problem List   Diagnosis Date Noted  . Physical deconditioning 12/20/2013  . 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 Gout 12/15/2013  . Clinical depression 12/15/2013  . Acute respiratory failure with hypercapnia 12/07/2013  . Morbid obesity 12/07/2013  . Neoplasm of brain causing mass effect on adjacent structures 12/06/2013  . 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 Wheezing 10/06/2013  . OSA (obstructive sleep apnea) 10/06/2013  . Injury of kidney 08/07/2013  . Acute on chronic diastolic heart failure 86/76/1950  . Obstructive apnea 08/07/2013  . Disorder of peripheral nervous system 08/07/2013  . 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 Extreme obesity 08/02/2013  . Chronic diastolic heart failure 93/26/7124  . Infected wound 07/28/2013  . 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 Elevated blood uric acid level 04/23/2013  . Abdominal wall abscess 12/27/2012  . Calculus of kidney 11/22/2012  . Kidney cysts 11/22/2012  . BP (high blood pressure) 05/21/2012  . H/O infectious disease 12/04/2011    Expected Discharge Date: Expected Discharge Date: 01/01/14  Team Members Present: Physician leading conference: Dr. Alger Simons Social Worker Present: Lennart Pall, LCSW Nurse Present: Elliot Cousin, RN PT Present: Melene Plan, Cottie Banda, PT OT Present: Roanna Epley,  COTA;Jennifer Tamala Julian, OT;Katie Allene Pyo, OT SLP Present: Weston Anna, SLP Other (Discipline and Name): Danne Baxter, RN [facility]) PPS Coordinator present : Daiva Nakayama, RN, CRRN;Becky Alwyn Ren, PT     Current Status/Progress Goal Weekly Team Focus  Medical   improved pain control. tumor benign  see prior, pacing, pain control  weight mgt, strengthening activities   Bowel/Bladder   continent of bowel on bladder   Patient to remain continent of bowel and bladder  Min A   Swallow/Nutrition/ Hydration             ADL's   bathing at bed level-setup; UB dressing-supervision; LB dressing-min A for sit<>stand or supervision at bed level; transfers-min/mod a  mod I overall  sit<>stand; standing balance; transfers   Mobility   Supervision for bed mobility with use of hospital bed; Min-mod A for transfers; close(S) for ambulation w/ RW up to 7'; Supervision-min A for w/c propulsion x15' w/ B UE  Mod(I)-(S)  functiona endurance, B LE strength, balance, safety, d/c planning   Communication             Safety/Cognition/ Behavioral Observations            Pain   Percocet 1-2 tabs q 4 hrs PRN, Dilaudid 2 mg q 3hrs PRN, Oxycodone 12 hr. tab 20 mg.  Pain managed at 5 or below  Assess pain q 4 hrs and after each pain medication intervention   Skin   Wound vac RLQ, changed MWF by WOC Reddened areas under abdoninal folds, microguard powder applied  No breakdown/infection during stay.Marland KitchenMarland KitchenMarland KitchenPatient to regain integrity of skin surface.  Assess skin q shift for any new  breakdown    Rehab Goals Patient on target to meet rehab goals: Yes *See Care Plan and progress notes for long and short-term goals.  Barriers to Discharge: pain, size, see prior    Possible Resolutions to Barriers:  see prior    Discharge Planning/Teaching Needs:  home with parents to provide supervision      Team Discussion:  Making gains and expect to reach goals with 2 day extension.  SW still working on Water quality scientist power w/c for pt if possible.    Revisions to Treatment Plan:  Extension of LOS x 2 days   Continued Need for Acute Rehabilitation Level of Care: The patient requires daily medical management by a physician with specialized training in physical medicine and rehabilitation for the following conditions: Daily direction of a multidisciplinary physical rehabilitation program to ensure safe treatment while eliciting the highest outcome that is of practical value to the patient.: Yes Daily medical management of patient stability for increased activity during participation in an intensive rehabilitation regime.: Yes Daily analysis of laboratory values and/or radiology reports with any subsequent need for medication adjustment of medical intervention for : Pulmonary problems;Post surgical problems;Neurological problems  [name] [name] 12/31/2013, 2:01 PM

## 2013-12-31 NOTE — Progress Notes (Signed)
[place] PHYSICAL MEDICINE & REHABILITATION     PROGRESS NOTE    Subjective/Complaints:  No new issues. Pain under control. met with radiation onc yesterday. Review of Systems - otherwise negative  Objective: Vital Signs: Blood pressure 129/88, pulse 74, temperature 97.4 F (36.3 C), temperature source Oral, resp. rate 18, weight 209 kg (460 lb 12.2 oz), SpO2 98.00%. No results found. No results found for this basename: WBC, HGB, HCT, PLT,  in the last 72 hours  Recent Labs  12/30/13 0530 12/31/13 0545  NA 141 145  K 3.8 3.7  CL 102 104  GLUCOSE 90 95  BUN 27* 26*  CREATININE 0.98 1.08  CALCIUM 8.9 8.8   CBG (last 3)  No results found for this basename: GLUCAP,  in the last 72 hours  Wt Readings from Last 3 Encounters:  12/31/13 209 kg (460 lb 12.2 oz)  12/20/13 199 kg (438 lb 11.5 oz)  12/20/13 199 kg (438 lb 11.5 oz)    Physical Exam:  Morbidly obese.  HENT:  Head: Normocephalic and atraumatic.  Eyes: Conjunctivae are normal. Pupils are equal, round, and reactive to light.   Neck: Normal range of motion. Neck supple.  Cardiovascular: Normal rate and regular rhythm.  No murmur heard.  Respiratory: Effort normal. No respiratory distress. He has wheezes.  GI: Soft. Bowel sounds are normal. He exhibits no distension. There is no tenderness.  Obese with VAC in place. Area is slightly red around dressing site/opsite Musculoskeletal: He exhibits no edema.  ROM better both knees Neurological: He is alert and oriented to person, place, and time.  Reasonable insight and awareness.  Skin: Skin is warm and dry.  Psychiatric: anxious   Assessment/Plan: 1. Functional deficits secondary to deconditioning related to multiple medical issues which require 3+ hours per day of interdisciplinary therapy in a comprehensive inpatient rehab setting. Physiatrist is providing close team supervision and 24 hour management of active medical problems listed below. Physiatrist and  rehab team continue to assess barriers to discharge/monitor patient progress toward functional and medical goals.    FIM: FIM - Bathing Bathing Steps Patient Completed: Chest;Right Arm;Left Arm;Abdomen;Front perineal area;Buttocks;Right upper leg;Left upper leg;Right lower leg (including foot);Left lower leg (including foot) Bathing: 5: Supervision: Safety issues/verbal cues  FIM - Upper Body Dressing/Undressing Upper body dressing/undressing steps patient completed: Thread/unthread right sleeve of pullover shirt/dresss;Thread/unthread left sleeve of pullover shirt/dress;Put head through opening of pull over shirt/dress;Pull shirt over trunk Upper body dressing/undressing: 5: Set-up assist to: Obtain clothing/put away FIM - Lower Body Dressing/Undressing Lower body dressing/undressing steps patient completed: Thread/unthread right pants leg;Thread/unthread left pants leg;Pull pants up/down;Don/Doff right sock;Don/Doff left sock Lower body dressing/undressing: 4: Min-Patient completed 75 plus % of tasks  FIM - Toileting Toileting steps completed by patient: Adjust clothing prior to toileting;Performs perineal hygiene;Adjust clothing after toileting Toileting Assistive Devices: Grab bar or rail for support Toileting: 5: Supervision: Safety issues/verbal cues  FIM - Toilet Transfers Toilet Transfers Assistive Devices: Bedside commode Toilet Transfers: 0-Activity did not occur  FIM - Bed/Chair Transfer Bed/Chair Transfer Assistive Devices: Arm rests;Walker;Bed rails Bed/Chair Transfer: 5: Supine > Sit: Supervision (verbal cues/safety issues);5: Bed > Chair or W/C: Supervision (verbal cues/safety issues);5: Chair or W/C > Bed: Supervision (verbal cues/safety issues)  FIM - Locomotion: Wheelchair Distance: 100 ft Locomotion: Wheelchair: 1: Travels less than 50 ft with supervision, cueing or coaxing FIM - Locomotion: Ambulation Locomotion: Ambulation Assistive Devices: Walker -  Rolling Ambulation/Gait Assistance: 5: Supervision Locomotion: Ambulation: 1: Travels less than 50   ft with minimal assistance (Pt.>75%)  Comprehension Comprehension Mode: Auditory Comprehension: 7-Follows complex conversation/direction: With no assist  Expression Expression Mode: Verbal Expression: 7-Expresses complex ideas: With no assist  Social Interaction Social Interaction: 7-Interacts appropriately with others - No medications needed.  Problem Solving Problem Solving: 7-Solves complex problems: Recognizes & self-corrects  Memory Memory: 7-Complete Independence: No helper Medical Problem List and Plan:  1. Functional deficits secondary to deconditioning related to medical issues, pituitary tumor resection   -MRI with debulking---some residual tumor  -tumor is NON-MALIGNANT. Radiation therapy once pt has lost adequate weight 2. DVT Prophylaxis/Anticoagulation: Pharmaceutical: Lovenox  3. Pain Management: robaxin, percocet, add dilaudid for more severe/acute pain  -topamax 11m bid -arthrotec (using home supply)---he is trying voltaren as well to see if this helps  -ms contin helpful 4. Mood: xanax prn, celexa, egosupport  5. Neuropsych: This patient is capable of making decisions on his own behalf.  6. 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 Wound care---vac per woc----area stable   -dced diflucan 8. Enstage OA of bilateral knees--arthrotec , trial analgesic cream -outpt follow up with ortho -was scheduled for synvisc. Can inject as outpt   LOS (Days) 11 A FACE TO FACE EVALUATION WAS PERFORMED  [name] [name] 12/31/2013 8:19 AM

## 2013-12-31 NOTE — Progress Notes (Signed)
Physical Therapy Discharge Summary  Patient Details  Name: [name] [name] MRN: [id number] Date of Birth: 05-05-1966  Today's Date: 12/31/2013 Time: Treatment Session 1: 1000-1100; Treatment Session 2: 9323-5573 Time Calculation (min): Treatment Session 1: 60 min; Treatment Session 2: 5mn  Patient has met 7 of 8 long term goals due to improved activity tolerance, improved balance, increased strength, decreased pain and ability to compensate for deficits.  Patient to discharge at limited household mobility with use of wheelchair or [facility]. Pt is able to direct family for care as needed. Pt planning to rent power w/c for increased functional mobility and independence.   Reasons goals not met: Pt did not achieve w/c propulsion goal in community environment due to lack of opportunity- per pt request.   Recommendation:  Patient will benefit from ongoing skilled PT services in home health setting to continue to advance safe functional mobility, address ongoing impairments in decreased functional endurance, decreased strength, decreased balance, decreased overall functional mobility, and minimize fall risk.  Equipment: No equipment provided  Reasons for discharge: treatment goals met and discharge from hospital  Patient/family agrees with progress made and goals achieved: Yes  Skilled Therapeutic Interventions Treatment Session 1:  1:1. Pt receive sitting EOB, ready for therapy. Focus this session on d/c planning and functional endurance. [facility] safety in home environment and made aware that ESerafina Royals ATP, CRTS from [employer] would be present tomorrow to assist pt with trial of power w/c to rent for increased functional independence in home and community environments. Pt verbalized understanding.  Pt verbally guiding therapist through car transfer, no corrections for current technique used. Pt able to demonstrate bed mobility, SPTs w/ RW,  ambulation x10' w/ RW and w/c propulsion 25'x2 at mod(I) level.   Pt req frequent seated rest breaks throughout session due to fatigue. Pt left supine in bed at end of session w/ all needs in reach.   Treatment Session 2:  1:1. Pt received sitting EOB, ready for therapy. Pt politely declined planned activity to go off unit for w/c propulsion in community environment due to fatigue. Focus this session on functional endurance and d/c planning. Pt engaged in weighted ball toss and reaching activity as well as hitting small yellow therapy ball with 3lb bar weight via chest press or bat position to challenge endurance and gross strength. Pt req cues for breathing throughout session as well as frequent seated rest breaks due to fatigue. Reviewed d/c education from tx session earlier in day. PT left sitting EOB with all needs in reach.   PT Discharge Precautions/Restrictions Precautions Precautions: Fall Precaution Comments: wound vac Restrictions Weight Bearing Restrictions: No Pain Pain Assessment Pain Assessment: 0-10 Pain Score: 8  Pain Location: Knee Pain Orientation: Right;Left Pain Descriptors / Indicators: Aching Pain Intervention(s): Distraction (Pt reported just having recieved pain medicine) Vision/Perception  See OT discharge Cognition Overall Cognitive Status: Within Functional Limits for tasks assessed Arousal/Alertness: Awake/alert Orientation Level: Oriented X4 Attention: Alternating Alternating Attention: Appears intact Memory: Appears intact Awareness: Appears intact Safety/Judgment: Appears intact Sensation Sensation Light Touch: Impaired by gross assessment Proprioception: Appears Intact Additional Comments: when testing sensation, pt reports ability to feel certain places on B LE better than others, no specific pattern Coordination Gross Motor Movements are Fluid and Coordinated: No Coordination and Movement Description: imppaired due to pain in B knees Motor   Motor Motor: Within Functional Limits  Mobility Bed Mobility Bed Mobility: Left Sidelying to Sit;Rolling Left  Rolling Left: 6: Modified independent (Device/Increase time) Left Sidelying to Sit: 6: Modified independent (Device/Increase time) Transfers Transfers: Yes Sit to Stand: 6: Modified independent (Device/Increase time) Stand to Sit: 6: Modified independent (Device/Increase time) Stand Pivot Transfers: 6: Modified independent (Device/Increase time) Locomotion  Ambulation Ambulation: Yes Ambulation/Gait Assistance: 6: Modified independent (Device/Increase time) Ambulation Distance (Feet): 10 Feet Assistive device: Rolling walker Gait Gait: Yes Gait Pattern: Impaired (Impaired at baseline due to chronic B knee pain) Gait Pattern: Step-through pattern;Decreased hip/knee flexion - right;Decreased stride length;Decreased hip/knee flexion - left;Wide base of support Stairs / Additional Locomotion Stairs: No Architect: Yes Wheelchair Assistance: 6: Modified independent (Device/Increase time) Environmental health practitioner: Both upper extremities Wheelchair Parts Management: Needs assistance Distance: 25' in forwards or reverse directions  Trunk/Postural Assessment  Cervical Assessment Cervical Assessment: Within Functional Limits Thoracic Assessment Thoracic Assessment: Within Functional Limits Lumbar Assessment Lumbar Assessment: Within Functional Limits Postural Control Postural Control: Within Functional Limits  Balance Balance Balance Assessed: Yes Static Sitting Balance Static Sitting - Balance Support: No upper extremity supported;Feet unsupported Static Sitting - Level of Assistance: 7: Independent Dynamic Sitting Balance Dynamic Sitting - Balance Support: Left upper extremity supported;Right upper extremity supported;Bilateral upper extremity supported Dynamic Sitting - Level of Assistance: 6: Modified independent (Device/Increase  time) Dynamic Sitting - Balance Activities: Forward lean/weight shifting;Reaching for objects;Reaching across midline Static Standing Balance Static Standing - Balance Support: Bilateral upper extremity supported Static Standing - Level of Assistance: 6: Modified independent (Device/Increase time) Dynamic Standing Balance Dynamic Standing - Balance Support: Bilateral upper extremity supported Dynamic Standing - Level of Assistance: 6: Modified independent (Device/Increase time) Dynamic Standing - Balance Activities: Lateral lean/weight shifting;Forward lean/weight shifting;Reaching for objects Extremity Assessment  RUE Assessment RUE Assessment: Within Functional Limits LUE Assessment LUE Assessment: Within Functional Limits RLE Assessment RLE Assessment: Exceptions to [facility] RLE Strength RLE Overall Strength: Due to premorbid status;Deficits;Due to pain RLE Overall Strength Comments: Grossly 4/5. Pt has significant pain in B knees during WB. LLE Assessment LLE Assessment: Exceptions to [facility] LLE Strength LLE Overall Strength: Due to premorbid status;Due to pain;Deficits LLE Overall Strength Comments: Grossly 4/5. Pt has significant pain in B knees during WB.  See FIM for current functional status  Gilmore Laroche 12/31/2013, 7:47 PM

## 2013-12-31 NOTE — Progress Notes (Signed)
Occupational Therapy Session Note  Patient Details  Name: [name] [name] MRN: [id number] Date of Birth: [birth year]  Today's Date: 12/31/2013 Session 1  Time: 5427-0623 Time Calculation (min): 45 min  Short Term Goals: Week 1:  OT Short Term Goal 1 (Week 1): STG=LTG  Skilled Therapeutic Interventions/Progress Updates:    Pt engaged in BADLs EOB with sit<>stand from EOB to pull up pants.  Pt utilized AE appropriately to complete all tasks at mod I level.  Pt requested to use BSC and performed transfer and toileting tasks with mod I.  Pt continues to required extra time to complete tasks with multiple rest breaks.  Continued discharge planning and discussion regarding importance of setting up a schedule at home.  Recommended to patient to practice tub bench transfer with HHOT before using shower the first time.  Pt verbalized understanding. Focus on activity tolerance, sit<>stand, standing balance, and safety awareness.  Therapy Documentation Precautions:  Precautions Precautions: Fall Precaution Comments: wound vac Restrictions Weight Bearing Restrictions: No   Pain: Pain Assessment Pain Assessment: 0-10 Pain Score: 8  Pain Type: Acute pain Pain Location: Knee Pain Orientation: Right;Left Pain Descriptors / Indicators: Aching Pain Onset: On-going Pain Intervention(s): Distraction (Pt reported just having recieved pain medicine)  See FIM for current functional status  Therapy/Group: Individual Therapy  Session 2 Time: 1330-1400 Pt c/o dull headache; RN notified Individual therapy  Pt engaged in EOB activities (sit<>stand, standing balance) and vision testing in preparation for discharge tomorrow.  Continued discharge planning and recommendations for home activities.  Pt verbalized understanding and is pleased with progress.  Pt stated he is anxious about going home but feels well prepared.  [name] [facility] 12/31/2013, 12:16 PM

## 2013-12-31 NOTE — Telephone Encounter (Signed)
Please advise VS thanks

## 2013-12-31 NOTE — Telephone Encounter (Signed)
Please arrange for split night sleep study 

## 2013-12-31 NOTE — Progress Notes (Signed)
Social Work Patient ID: [name] [name], male   DOB: 01-26-1966, 48 y.o.   MRN: [id number]   Have reveiwed team conference with pt and mother (via phone) with both feeling ready for d/c tomorrow.  Have placed order for home BiPap and restart of [place] services via [facility].  Have also arranged for local DME rep to bring potential loaner power w/c to pt in morning as well.  We hope that this will fit pt adequately as company willing to place on loan to pt for 3 months at no charge.  Continue to follow.  Kanesha Cadle, LCSW

## 2013-12-31 NOTE — Progress Notes (Signed)
Patient ID: [name] [name], male   DOB: Sep 07, 1965, 48 y.o.   MRN: [id number] Neuro stable. Radiation oncology note read

## 2014-01-01 DIAGNOSIS — S31109A Unspecified open wound of abdominal wall, unspecified quadrant without penetration into peritoneal cavity, initial encounter: Secondary | ICD-10-CM

## 2014-01-01 DIAGNOSIS — R51 Headache: Secondary | ICD-10-CM

## 2014-01-01 DIAGNOSIS — R519 Headache, unspecified: Secondary | ICD-10-CM | POA: Diagnosis present

## 2014-01-01 LAB — BASIC METABOLIC PANEL
Anion gap: 9 (ref 5–15)
BUN: 30 mg/dL — ABNORMAL HIGH (ref 6–23)
CO2: 29 mEq/L (ref 19–32)
Calcium: 8.8 mg/dL (ref 8.4–10.5)
Chloride: 106 mEq/L (ref 96–112)
Creatinine, Ser: 1.02 mg/dL (ref 0.50–1.35)
GFR calc Af Amer: >90 mL/min (ref >90)
GFR calc non Af Amer: 84 mL/min — ABNORMAL LOW (ref >90)
Glucose, Bld: 88 mg/dL (ref 70–99)
Potassium: 4 mEq/L (ref 3.7–5.3)
Sodium: 144 mEq/L (ref 137–147)

## 2014-01-01 LAB — MAGNESIUM: Magnesium: 2.2 mg/dL (ref 1.5–2.5)

## 2014-01-01 MED ORDER — MUSCLE RUB 10-15 % EX CREA: 1.0000 "application " | TOPICAL_CREAM | Freq: Two times a day (BID) | CUTANEOUS | Status: DC

## 2014-01-01 MED ORDER — TESTOSTERONE CYPIONATE 200 MG/ML IM SOLN: 100.0000 mg | INTRAMUSCULAR | Status: DC

## 2014-01-01 MED ORDER — POLYETHYLENE GLYCOL 3350 17 G PO PACK: 17.0000 g | PACK | Freq: Two times a day (BID) | ORAL | Status: DC

## 2014-01-01 MED ORDER — OXYCODONE HCL ER 20 MG PO T12A: 20.0000 mg | EXTENDED_RELEASE_TABLET | Freq: Two times a day (BID) | ORAL | Status: DC

## 2014-01-01 MED ORDER — FUROSEMIDE 40 MG PO TABS: 40.0000 mg | ORAL_TABLET | Freq: Every day | ORAL | Status: DC

## 2014-01-01 MED ORDER — DICLOFENAC SODIUM 1 % TD GEL: 4.0000 g | Freq: Four times a day (QID) | TRANSDERMAL | Status: DC

## 2014-01-01 MED ORDER — LEVOTHYROXINE SODIUM 50 MCG PO TABS: 50.0000 ug | ORAL_TABLET | Freq: Every day | ORAL | Status: DC

## 2014-01-01 MED ORDER — TOPIRAMATE 50 MG PO TABS: 50.0000 mg | ORAL_TABLET | Freq: Two times a day (BID) | ORAL | Status: DC

## 2014-01-01 MED ORDER — ALPRAZOLAM 0.25 MG PO TABS: 0.2500 mg | ORAL_TABLET | Freq: Four times a day (QID) | ORAL | Status: DC | PRN

## 2014-01-01 MED ORDER — OXYCODONE-ACETAMINOPHEN 5-325 MG PO TABS: 1.0000 | ORAL_TABLET | Freq: Three times a day (TID) | ORAL | Status: DC | PRN

## 2014-01-01 NOTE — Progress Notes (Signed)
Recreational Therapy Discharge Summary Patient Details  Name: [name] [name] MRN: [id number] Date of Birth: [birth year] Today's Date: 01/01/2014  Long term goals set: 1  Long term goals met: 1  Comments on progress toward goals: Pt has made excellent progress toward goal and is ready for discharge home today at Mod I w/c level.  Sessions primarily focused on techniques to assist with anxiety management.  Instructed pt on diaphragmatic breathing techniques with pt returning demonstration with independence & reporting use & effectiveness.  Reasons for discharge: discharge from hospital  Patient/family agrees with progress made and goals achieved: Yes  Tyrone Pautsch 01/01/2014, 8:13 AM

## 2014-01-01 NOTE — Progress Notes (Signed)
Patient received discharge instructions from Algis Liming, PA-C with verbal understanding. Patient discharged to home with caregiver and belongings.

## 2014-01-01 NOTE — Progress Notes (Signed)
[facility] PHYSICAL MEDICINE & REHABILITATION     PROGRESS NOTE    Subjective/Complaints:  Excited but a little anxious about going home.  Review of Systems - otherwise negative  Objective: Vital Signs: Blood pressure 129/84, pulse 78, temperature 97.5 F (36.4 C), temperature source Oral, resp. rate 18, weight 208 kg (458 lb 8.9 oz), SpO2 99.00%. No results found. No results found for this basename: WBC, HGB, HCT, PLT,  in the last 72 hours  Recent Labs  12/31/13 0545 01/01/14 0510  NA 145 144  K 3.7 4.0  CL 104 106  GLUCOSE 95 88  BUN 26* 30*  CREATININE 1.08 1.02  CALCIUM 8.8 8.8   CBG (last 3)  No results found for this basename: GLUCAP,  in the last 72 hours  Wt Readings from Last 3 Encounters:  01/01/14 208 kg (458 lb 8.9 oz)  12/20/13 199 kg (438 lb 11.5 oz)  12/20/13 199 kg (438 lb 11.5 oz)    Physical Exam:  Morbidly obese.  HENT:  Head: Normocephalic and atraumatic.  Eyes: Conjunctivae are normal. Pupils are equal, round, and reactive to light.   Neck: Normal range of motion. Neck supple.  Cardiovascular: Normal rate and regular rhythm.  No murmur heard.  Respiratory: Effort normal. No respiratory distress. He has wheezes.  GI: Soft. Bowel sounds are normal. He exhibits no distension. There is no tenderness.  Obese with VAC in place. Area is slightly red around dressing site/opsite Musculoskeletal: He exhibits no edema.  ROM better both knees Neurological: He is alert and oriented to person, place, and time.  Reasonable insight and awareness.  Skin: Skin is warm and dry.  Psychiatric: anxious   Assessment/Plan: 1. Functional deficits secondary to deconditioning related to multiple medical issues which require 3+ hours per day of interdisciplinary therapy in a comprehensive inpatient rehab setting. Physiatrist is providing close team supervision and 24 hour management of active medical problems listed below. Physiatrist and rehab team continue to  assess barriers to discharge/monitor patient progress toward functional and medical goals.  Home today. Pleased with progress. Pain better. Follow up services and equipment being arranged. I would prefer just using oxygen at night for now---utilize rest breaks and proper breathing techniques  I'll see him back in about 4-6 weeks   FIM: FIM - Bathing Bathing Steps Patient Completed: Chest;Right Arm;Left Arm;Abdomen;Front perineal area;Buttocks;Left lower leg (including foot);Right lower leg (including foot);Left upper leg;Right upper leg Bathing: 6: Assistive device (Comment)  FIM - Upper Body Dressing/Undressing Upper body dressing/undressing steps patient completed: Thread/unthread right sleeve of pullover shirt/dresss;Thread/unthread left sleeve of pullover shirt/dress;Put head through opening of pull over shirt/dress;Pull shirt over trunk Upper body dressing/undressing: 7: Complete Independence: No helper FIM - Lower Body Dressing/Undressing Lower body dressing/undressing steps patient completed: Thread/unthread right pants leg;Thread/unthread left pants leg;Pull pants up/down;Don/Doff right sock;Don/Doff left sock Lower body dressing/undressing: 6: Assistive device (Comment)  FIM - Toileting Toileting steps completed by patient: Adjust clothing prior to toileting;Performs perineal hygiene;Adjust clothing after toileting Toileting Assistive Devices: Grab bar or rail for support Toileting: 6: Assistive device: No helper  FIM - Radio producer Devices: Recruitment consultant Transfers: 6-Assistive device: No helper;6-To toilet/ BSC;6-From toilet/BSC  FIM - Control and instrumentation engineer Devices: Arm rests;Walker;Bed rails Bed/Chair Transfer: 6: Supine > Sit: No assist;6: Sit > Supine: No assist;6: Bed > Chair or W/C: No assist;6: Chair or W/C > Bed: No assist  FIM - Locomotion: Wheelchair Distance: 25' in forwards or reverse  directions Locomotion: Wheelchair: 1: Travels less than 50 ft with supervision, cueing or coaxing FIM - Locomotion: Ambulation Locomotion: Ambulation Assistive Devices: Administrator Ambulation/Gait Assistance: 6: Modified independent (Device/Increase time) Locomotion: Ambulation: 1: Travels less than 50 ft with supervision/safety issues (Pt amb <50' mod(I))  Comprehension Comprehension Mode: Auditory Comprehension: 7-Follows complex conversation/direction: With no assist  Expression Expression Mode: Verbal Expression: 7-Expresses complex ideas: With no assist  Social Interaction Social Interaction: 7-Interacts appropriately with others - No medications needed.  Problem Solving Problem Solving: 7-Solves complex problems: Recognizes & self-corrects  Memory Memory Mode: Asleep Memory: 7-Complete Independence: No helper Medical Problem List and Plan:  1. Functional deficits secondary to deconditioning related to medical issues, pituitary tumor resection   -MRI with debulking---some residual tumor  -tumor is NON-MALIGNANT. Radiation therapy once pt has lost adequate weight 2. DVT Prophylaxis/Anticoagulation: Pharmaceutical: Lovenox  3. Pain Management: robaxin, percocet, add dilaudid for more severe/acute pain  -topamax 50mg  bid -arthrotec (using home supply)---he is trying voltaren as well to see if this helps  -ms contin helpful 4. Mood: xanax prn, celexa, egosupport  5. Neuropsych: This patient is capable of making decisions on his own behalf.  6. 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 Wound care---vac per woc----area stable   -dced diflucan----likely more irritation from dressing than anything else 8. Enstage OA of bilateral knees--arthrotec , trial analgesic cream -outpt follow up with ortho -was scheduled for synvisc. Can inject as outpt   LOS (Days) 12 A FACE TO FACE EVALUATION WAS PERFORMED  SWARTZ,ZACHARY T 01/01/2014 8:37 AM

## 2014-01-01 NOTE — Discharge Instructions (Signed)
Inpatient Rehab Discharge Instructions  [facility] Discharge date and time:  12/30/13  Activities/Precautions/ Functional Status: Activity: no lifting, driving, or strenuous exercise for till cleared by MD Diet: cardiac diet Low salt Wound Care:  Continue VAC changes Mon, Wed, Fri  Functional status:  ___ No restrictions     ___ Walk up steps independently ___ 24/7 supervision/assistance   ___ Walk up steps with assistance _X__ Intermittent supervision/assistance  ___ Bathe/dress independently _X__ Walk with walker    __ Bathe/dress with assistance _X__ Walk Independently    ___ Shower independently ___ Walk with assistance    ___ Shower with assistance _X__ No alcohol     ___ Return to work/school ________   COMMUNITY REFERRALS UPON DISCHARGE:    Home Health:   PT     OT      RN                    Agency: [facility] Phone: [phone]   Medical Equipment/Items Ordered: BiPap                                                      Agency/Supplier: [facility] @ [phone]      Special Instructions: 1. Use BIPAP when asleep (nights or naps)   My questions have been answered and I understand these instructions. I will adhere to these goals and the provided educational materials after my discharge from the hospital.  Patient/Caregiver Signature _______________________________ Date __________  Clinician Signature _______________________________________ Date __________  Please bring this form and your medication list with you to all your follow-up doctor's appointments.

## 2014-01-01 NOTE — Progress Notes (Signed)
A new CPAP was set up for patient in room. I added water and he placed himself on. Resting comfortably.

## 2014-01-01 NOTE — Discharge Summary (Signed)
Physician Discharge Summary  Patient ID: [name] [name] MRN: [id number] DOB/AGE: July 15, 1965 48 y.o.  Admit date: 12/20/2013 Discharge date: 01/01/2014  Discharge Diagnoses:  Principal Problem:   Physical deconditioning Active Problems:   OSA (obstructive sleep apnea)   Abdominal wall abscess   Extreme obesity   Chronic diastolic heart failure   Panhypopituitarism   New onset of headaches   Discharged Condition: Stable  Significant Diagnostic Studies:  Mr Kizzie Fantasia Contrast  12/24/2013   CLINICAL DATA:  Headache. Blurred vision. Reduced is. Previous pituitary tumor resection.  EXAM: MRI HEAD WITHOUT AND WITH CONTRAST  TECHNIQUE: Multiplanar, multiecho pulse sequences of the brain and surrounding structures were obtained without and with intravenous contrast.  CONTRAST:  84mL MULTIHANCE GADOBENATE DIMEGLUMINE 529 MG/ML IV SOLN  COMPARISON:  12/07/2013  FINDINGS: There is no acute or subacute infarction. The brainstem cerebellum are normal. Cerebral hemispheres are normal. No hydrocephalus or extra-axial collection. No evidence of intracranial hemorrhage.  The patient has had transsphenoidal surgery for debulking of the previously seen large an invasive sellar region mass. Most of the tissue the in the central sella in the tissue extending into the suprasellar region has been removed. There continues to be abnormal signal within the clivus consistent with bone invasion. 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 There is probably early invasion of the left cavernous sinus. Packing material is present in the sphenoid sinus.  IMPRESSION: Interval transsphenoidal surgery. Considerable debulking of the invasive mass with the epicenter in the region of the sella. Complete removal of the suprasellar component. 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   Electronically Signed   By: Nelson Chimes  M.D.   On: 12/24/2013 18:04    Labs:  Basic Metabolic Panel:  Recent Labs Lab 12/26/13 0500 12/27/13 0543 12/28/13 0522 12/29/13 0439 12/30/13 0530 12/31/13 0545 01/01/14 0510  NA 144 146 146 145 141 145 144  K 3.9 3.9 3.9 3.9 3.8 3.7 4.0  CL 102 104 105 105 102 104 106  CO2 32 31 31 31 29 30 29   GLUCOSE 108* 98 117* 129* 90 95 88  BUN 30* 29* 27* 28* 27* 26* 30*  CREATININE 1.06 1.02 1.01 1.02 0.98 1.08 1.02  CALCIUM 9.2 8.9 9.3 9.1 8.9 8.8 8.8  MG 2.4 2.4 2.3 2.2 2.3 2.3 2.2    CBC: CBC Latest Ref Rng 12/28/2013 12/16/2013 12/15/2013  WBC 4.0 - 10.5 K/uL 5.0 6.7 6.5  Hemoglobin 13.0 - 17.0 g/dL 11.4(L) 12.2(L) 12.9(L)  Hematocrit 39.0 - 52.0 % 38.0(L) 40.4 42.8  Platelets 150 - 400 K/uL 154 128(L) 128(L)     CBG: No results found for this basename: GLUCAP,  in the last 168 hours  Brief HPI:   [name] is a 48 y.o. male with history of HTN, OSA, diastolic CHF, morbid obesity, chronic abdominal wound with recent abdominal wall abscess s/p I and D 06/06/13 (with prolonged hospitalization and CIR stay 08/2013 in [place]) who was admitted on 12/06/13 with 2 day history of HA, blurry vision with right ptosis, hematemesis, and hypercarbic respiratory failure. He was transferred from [facility] to [facility] for management as CT head revealed prominent, large sellar & suprasellar mass lesion with extension into the R cavernous sinus, ssuperior extension beyond the diaphragm sella. Osseous destruction with extension into sphenoid sinus.   Dr. Joya Salm and [facility] ENT consulted for input on surgical intervention and patient underwent he  underwent transphenoidal resection of pituitary tumor on 12/10/13. Recheck cortisol level is down to 12.5, ACTH-81, T3- 2.0, Free T4- 0.64, TSH-0.59, free testosterone 17.1 and SHBG-18. He  was started on synthroid as well as depotestosterone for supplementation and is to follow up with Dr. [name] on outpatient basis for further input. Patient continues to be  limited by diplopia, generalized weakness, headaches, knee pain as well as body habitus. Therapy initiated and CIR was recommended by rehab team.    Hospital Course: Vasiliy Staat was admitted to rehab 12/20/2013 for inpatient therapies to consist of PT, ST and OT at least three hours five days a week. Past admission physiatrist, therapy team and rehab RN have worked together to provide customized collaborative inpatient rehab. Blood pressures have been monitored on tid basis and have been well controlled. Weight has been stable and CHF noted to be compensated. He has been using BIPAP with 3 liters bleed in for treatment of OSA. Pathology showed pituitary adenoma with necrosis and follow up MRI  Brain shows residual tumor and XRT recommended for treatment by Dr. Tammi Klippel. Po intake has been good and he's continent of B/B. Abdominal wound has been healing well with MWF VAC changes as well as nutritional supplement to help promote wound healing. He was started on Topamax for headaches but continues to require oxycodone on tid prn basis. He is to discuss titration of  Topamax further with PMD.  He has had high levels of anxiety intermittently and this was  alleviated with use of xanax prn.  He has made good progress in therapy but continues to be limited due to knee pain.  He has progressed to independent level and will continue to receive HHPT and HHOT , HHRN for further strengthening, endurance and wound care by [facility].     Rehab course: During patient's stay in rehab weekly team conferences were held to monitor patient's progress, set goals and discuss barriers to discharge. Patient has had improvement in activity tolerance, balance, postural control, as well as ability to compensate for deficits. He has had improvement in functional use BLE, increased ability to perform SLR as well as increased in walking distance.  He is independent for transfers and is able to ambulate 10 feet with RW. He is  able to propel wheelchair 25 feet x 2. He is able to complete bathing, dressing and toileting tasks with use of AE at modified independent level.    Disposition: 01-Home or Self Care   Diet: Low fat. Low salt.  Special Instructions: 1. Use BIPAP whenever asleep.     Medication List    STOP taking these medications       carvedilol 12.5 MG tablet  Commonly known as:  COREG     diclofenac 75 MG EC tablet  Commonly known as:  VOLTAREN     enoxaparin 100 MG/ML injection  Commonly known as:  LOVENOX     GRALISE 300 MG Tabs  Generic drug:  Gabapentin (PHN)     HYDROcodone-acetaminophen 5-325 MG per tablet  Commonly known as:  NORCO/VICODIN     misoprostol 200 MCG tablet  Commonly known as:  CYTOTEC     ondansetron 4 MG/2ML Soln injection  Commonly known as:  ZOFRAN     potassium chloride 10 MEQ tablet  Commonly known as:  K-DUR,KLOR-CON     senna-docusate 8.6-50 MG per tablet  Commonly known as:  Senokot-S      TAKE these medications  albuterol 108 (90 BASE) MCG/ACT inhaler  Commonly known as:  PROAIR HFA  Inhale 1-2 puffs into the lungs every 6 (six) hours as needed for wheezing or shortness of breath.     allopurinol 300 MG tablet  Commonly known as:  ZYLOPRIM  Take 300 mg by mouth daily.     ALPRAZolam 0.25 MG tablet  Commonly known as:  XANAX  Take 1 tablet (0.25 mg total) by mouth every 6 (six) hours as needed for anxiety.     ARTHROTEC 75-0.2 MG Tbec  Generic drug:  Diclofenac-Misoprostol  Take 1 tablet by mouth 2 (two) times daily.     citalopram 40 MG tablet  Commonly known as:  CELEXA  Take 40 mg by mouth daily.     cyclobenzaprine 10 MG tablet  Commonly known as:  FLEXERIL  Take 10 mg by mouth daily as needed for muscle spasms.     diclofenac sodium 1 % Gel  Commonly known as:  VOLTAREN  Apply 4 g topically 4 (four) times daily.     feeding supplement (PRO-STAT SUGAR FREE 64) Liqd  Take 30 mLs by mouth 3 (three) times daily with  meals.     fluconazole 100 MG tablet  Commonly known as:  DIFLUCAN  Take 1 tablet (100 mg total) by mouth daily.     fluticasone 50 MCG/ACT nasal spray  Commonly known as:  FLONASE  Place 2 sprays into both nostrils daily as needed for allergies or rhinitis.     furosemide 40 MG tablet  Commonly known as:  LASIX  Take 1 tablet (40 mg total) by mouth daily.     JUVEN PO  Take 1 packet by mouth 2 (two) times daily. Wound healing     lactulose 10 GM/15ML solution  Commonly known as:  CHRONULAC  Take 45 mLs (30 g total) by mouth 2 (two) times daily as needed for moderate constipation.     levothyroxine 50 MCG tablet  Commonly known as:  SYNTHROID, LEVOTHROID  Take 1 tablet (50 mcg total) by mouth daily before breakfast.     lidocaine 4 % external solution  Commonly known as:  XYLOCAINE  Apply topically daily as needed for mild pain.     loratadine 10 MG tablet  Commonly known as:  CLARITIN  Take 10 mg by mouth daily as needed for allergies.     multivitamin tablet  Take 1 tablet by mouth daily. Centrum brand     MUSCLE RUB 10-15 % Crea  Apply 1 application topically 2 (two) times daily.     OxyCODONE 20 mg T12a 12 hr tablet  Commonly known as:  OXYCONTIN  Take 1 tablet (20 mg total) by mouth every 12 (twelve) hours. For two weeks. Then decrease to one pill daily till gone.     oxyCODONE-acetaminophen 5-325 MG per tablet  Commonly known as:  PERCOCET/ROXICET  Take 1-2 tablets by mouth every 8 (eight) hours as needed for moderate pain.     polyethylene glycol packet  Commonly known as:  MIRALAX / GLYCOLAX  Take 17 g by mouth 2 (two) times daily.     PRENATAL PO  Take 1 tablet by mouth daily. For wound healing     PROBIOTIC DAILY Caps  Take 1 capsule by mouth daily.     pseudoephedrine 120 MG 12 hr tablet  Commonly known as:  SUDAFED  Take 120 mg by mouth daily as needed for congestion.     testosterone cypionate 200 MG/ML injection  Commonly known as:   DEPOTESTOTERONE CYPIONATE  Inject 0.5 mLs (100 mg total) into the muscle every 14 (fourteen) days.     topiramate 50 MG tablet  Commonly known as:  TOPAMAX  Take 1 tablet (50 mg total) by mouth 2 (two) times daily.     vitamin C 500 MG tablet  Commonly known as:  ASCORBIC ACID  Take 500 mg by mouth daily.       Follow-up Information   Follow up with Meredith Staggers, MD On 02/06/2014. (Be there at  11:30 am for 12 noon  appointment)    Specialty:  Physical Medicine and Rehabilitation   Contact information:   [address], [place] [address] [phone]       Follow up with Jacelyn Pi, MD. Call today. (for appointment)    Specialty:  Endocrinology   Contact information:   [address] [place] [address] [phone]       Call Floyce Stakes, MD. (for post op appointment)    Specialty:  Neurosurgery   Contact information:   [place] [address] [phone]       Follow up with Mauri Pole, MD. Call today. (for follow up appointment)    Specialty:  Orthopedic Surgery   Contact information:   [address] [place] [address] [phone]       Follow up with Melony Overly, MD. Call today. (for post op check)    Specialty:  Otolaryngology   Contact information:   [place] [address] [phone]       Call Lora Paula, MD. (for follow up appointment)    Specialty:  Radiation Oncology   Contact information:   [place] [address] [phone]       Schedule an appointment as soon as possible for a visit with [name], MD.   Specialty:  Internal Medicine   Contact information:   [place] [address] [phone]       Signed: Bary Leriche 01/01/2014, 3:55 PM

## 2014-01-01 NOTE — Telephone Encounter (Signed)
Order has been placed. Please advise thanks 

## 2014-01-01 NOTE — Telephone Encounter (Signed)
Sleep study scheduled for 03/04/14 pt's mother is aware and paperwork mailed to them [name] [name]

## 2014-01-02 NOTE — Progress Notes (Signed)
Social Work  Discharge Note  The overall goal for the admission was met for:   Discharge location: Yes - home with parents who can provide 24/7 supervision  Length of Stay: Yes - met modified LOS = 12 days  Discharge activity level: Yes - modified independent/ supervision  Home/community participation: Yes  Services provided included: MD, RD, PT, OT, RN, TR, Pharmacy, Neuropsych and SW  Financial Services: Private Insurance: [employer] [place]  Follow-up services arranged: Home Health: RN, PT, OT via [place], DME: [employer] via [place] and Patient/Family has no preference for HH/DME agencies  Comments (or additional information):  Patient/Family verbalized understanding of follow-up arrangements: Yes  Individual responsible for coordination of the follow-up plan: patient  Confirmed correct DME delivered: [name] 01/02/2014    [place], [place]

## 2014-01-10 ENCOUNTER — Telehealth: Payer: Self-pay | Admitting: Internal Medicine

## 2014-01-10 NOTE — Telephone Encounter (Signed)
Caller - Patty Hounshell re: [name] [name]: C/o itching of skin x 2 days, no rash New meds: Topamax for HA, Oxycontin, Oxycodone Advised:  1. Go to ER if rash appears or if itching is worse 2. Stop Topamax 3. Reduce Percocet in 1/2 4. Benadryl 25 mg po qid 5. Call Clinic for an OV on Monday

## 2014-01-11 NOTE — Telephone Encounter (Signed)
Doubt this itching came from meds-----he was on these meds for a week + prior to discharge. Contact pt Monday and find out how he's doing. Would resume topamax if his headaches return.

## 2014-01-12 ENCOUNTER — Encounter (HOSPITAL_BASED_OUTPATIENT_CLINIC_OR_DEPARTMENT_OTHER): Payer: BC Managed Care – PPO | Attending: Plastic Surgery

## 2014-01-12 ENCOUNTER — Ambulatory Visit: Payer: BC Managed Care – PPO | Admitting: Pulmonary Disease

## 2014-01-12 ENCOUNTER — Telehealth: Payer: Self-pay | Admitting: Physical Medicine and Rehabilitation

## 2014-01-12 DIAGNOSIS — Z9049 Acquired absence of other specified parts of digestive tract: Secondary | ICD-10-CM | POA: Diagnosis not present

## 2014-01-12 DIAGNOSIS — L02219 Cutaneous abscess of trunk, unspecified: Secondary | ICD-10-CM | POA: Diagnosis not present

## 2014-01-12 DIAGNOSIS — T8189XA Other complications of procedures, not elsewhere classified, initial encounter: Secondary | ICD-10-CM | POA: Insufficient documentation

## 2014-01-12 DIAGNOSIS — Y836 Removal of other organ (partial) (total) as the cause of abnormal reaction of the patient, or of later complication, without mention of misadventure at the time of the procedure: Secondary | ICD-10-CM | POA: Diagnosis not present

## 2014-01-12 DIAGNOSIS — L03319 Cellulitis of trunk, unspecified: Secondary | ICD-10-CM

## 2014-01-12 NOTE — Telephone Encounter (Signed)
Attempted to contact patient. Left a voicemail to return call to clinic.

## 2014-01-12 NOTE — Telephone Encounter (Signed)
Patient's mother called back about itching. He has  been taking Topamax and OxyContin. Itching is better with benadryl but it makes him sedated. Relayed that arthrotec is most likely the cause despite having taken many years. She feels that oxycontin/percocet is the culprit. He had some left over Vicodin that he used this weekend without SE. He continues to have 10/10 pain and needs something for pain.   He also has a bad odor from wound and has appointment with wound center today. Rx for Vicodin 5/325 mg # 30 pills written. He has an appointment with Dr. Shelia Media tomorrow and advised her to discuss patient's  HA management as well as mood/anxiety issues with him.

## 2014-01-12 NOTE — Consult Note (Signed)
[name] [name]. Georgette Dover, MD, [facility] Surgery  General/ Trauma Surgery  01/12/2014 8:15 AM

## 2014-01-12 NOTE — Telephone Encounter (Signed)
Patient's wife called on his behalf requesting a call back from [facility]. Wife has questions about his medications giving at discharge.

## 2014-01-13 ENCOUNTER — Telehealth: Payer: Self-pay | Admitting: Physical Medicine and Rehabilitation

## 2014-01-13 NOTE — Progress Notes (Signed)
[facility]  NAME:  [name] [name], [name] [name]           ACCOUNT NO.:  [id number]  MEDICAL RECORD NO.:  [id number]      DATE OF BIRTH:  Jul 27, 1965  PHYSICIAN:  Irene Limbo, MD    VISIT DATE:  01/12/2014                                  OFFICE VISIT   CHIEF COMPLAINT:  Chronic right abdominal wound.  HISTORY OF PRESENT ILLNESS:  The patient is a 48 year old male with complex history including partial colectomy in approximately 2007-2008 followed by incisional hernia and this was complicated by infection and removal of his mesh in 2008. All of these surgeries were completed in [place] where the patient was living at that time.  The patient developed chronic right lower quadrant abdominal wound, which was felt to be secondary to possible foreign body or retained suture per description of the patient.  He underwent incision and drainage in January 2015 and was found to have a large fluid collection present.  There was no communication with the bowels noted.  He was treated with IV antibiotics for Proteus for several months' time.  His current wound care has been a wound VAC.  More recently, he was admitted to the hospital for pituitary tumor and underwent transsphenoidal resection.  He is awaiting adjuvant radiotherapy, but was counseled to lose some weight, which he has now attained.  He was recommended as an inpatient from the wound nurses to seek ACell application and that is why he is referred to the clinic here today.  The patient reports that he has not been febrile at home, but overall has not felt well.  He does require viscous lidocaine and hydrocodone for pain management of his right abdominal wound.  He is accompanied by his mother with whom he lives.  He has [facility] for home health care.  On examination, his wound itself is completely granulated with no slough present.  There is surrounding erythema, however, that does tract over the right lateral  abdominal wall and over his panniculus.  There is edema that is consistent with both the dependent nature of his panniculus as well as cellulitis.  The VAC drainage itself is quite clean in nature.  ASSESSMENT:  We reviewed his new medications and topicals.  The mother expresses some concern that the topical barrier cream is a new brand and it may be contributing to the wound.  They currently do use antifungal cream in the area.  The patient continues to have pruritus.  It is unclear whether the patient is developing cellulitis over his right lateral abdomen.  He is seeing his primary care physician tomorrow.  I have counseled him that he may require IV antibiotics either through infusion center as for inpatient admission. Certainly if he continues to not feel well or develop fevers, he should present to the ED.   With regards to his wound, it is quite clean and I have a feeling that the [facility] is contributing to the chronicity of the wound as it is likely being filled with sponge the entire depth of the wound.  Given his periwound maceration and possible cellulitis, we will hold the [facility] for this week and return to silver alginate.  In the absence of any infection, the patient would be a good candidate for ACell given the chronic nonhealing nature of  the wound.  We will apply for application of this in the clinic.  We will hold the wound VAC for this week, but we discussed that we can continue to utilize this with ACell application. The patient will follow up in 1 week's time.          ______________________________ Irene Limbo, MD     BT/MEDQ  D:  01/12/2014  T:  01/13/2014  Job:  [id number]

## 2014-01-13 NOTE — Telephone Encounter (Signed)
Patient got Vicodin Rx at wound center therefore did not pick up prescription at the hospital. Rx shredded.

## 2014-01-19 DIAGNOSIS — L02219 Cutaneous abscess of trunk, unspecified: Secondary | ICD-10-CM | POA: Diagnosis not present

## 2014-01-19 DIAGNOSIS — J449 Chronic obstructive pulmonary disease, unspecified: Secondary | ICD-10-CM

## 2014-01-19 DIAGNOSIS — Z483 Aftercare following surgery for neoplasm: Secondary | ICD-10-CM | POA: Diagnosis not present

## 2014-01-19 DIAGNOSIS — Z9049 Acquired absence of other specified parts of digestive tract: Secondary | ICD-10-CM | POA: Diagnosis not present

## 2014-01-19 DIAGNOSIS — S31109A Unspecified open wound of abdominal wall, unspecified quadrant without penetration into peritoneal cavity, initial encounter: Secondary | ICD-10-CM | POA: Diagnosis not present

## 2014-01-19 DIAGNOSIS — T8189XA Other complications of procedures, not elsewhere classified, initial encounter: Secondary | ICD-10-CM | POA: Diagnosis not present

## 2014-01-19 DIAGNOSIS — J4489 Other specified chronic obstructive pulmonary disease: Secondary | ICD-10-CM | POA: Diagnosis not present

## 2014-01-19 NOTE — Progress Notes (Signed)
[facility]  NAME:  [name] [name], COLGAN NO.:  [id number]  MEDICAL RECORD NO.:  [id number]      DATE OF BIRTH:  [birth year]  PHYSICIAN:  [name] Kos, DO       VISIT DATE:  01/19/2014                                  OFFICE VISIT   HISTORY OF PRESENT ILLNESS:  The patient is a 48 year old male who is here for followup on his large abdominal wound.  He has been using the [facility] on it with some improvement but it has been a problem for at least 6 months when he has an abdominal abscess that was drained and the area has not cleaned.  PAST MEDICAL HISTORY:  Positive for: 1. Chronic sinus problems. 2. Sleep apnea. 3. Hypertension. 4. Gout. 5. Osteoarthritis. 6. Neuropathy. 7. Diverticulitis. 8. Depression.  SURGERIES:  Lithotripsy, Achilles tendon repair on both sides, abdominal abscess removal, and a colon resection for diverticulitis.  MEDICATION ALLERGIES:  PENICILLIN and MORPHINE.  MEDICATIONS:  Wellbutrin, Coreg, Flexeril, allopurinol, Arthrotec, Flonase, Lasix, hydrocortisone, Gralise, probiotics, lidocaine as needed, loratadine, potassium, Sudafed, and clindamycin.  SOCIAL HISTORY:  He lives at home and has a strong family support.  He is not a smoker, and he is not diabetic. He does use a walker.  REVIEW OF SYSTEMS:  Positive for cellulitis around the area.  PHYSICAL EXAMINATION:  GENERAL:  He is alert, oriented, not in any acute distress.  He is pleasant for the exam accompanied by his mother. HEENT:  Pupils are equal.  Extraocular muscles are intact. NECK:  He does not have any cervical lymphadenopathy. ABDOMEN:  He has an extremely large abdomen.  No organomegaly palpable.  His wound is extremely deep and those notes are noted in the chart at 11.3 x 9.7 x 2.4 for the wound.  It does look clean.  He has surrounding periwound cellulitis but that has apparently gotten better over the last week whereby Dr. Shelia Media had given him  clindamycin.  His lower extremities are swollen as well but pulses are present and recommendation is to continue with the VAC.  We will use a white sponge deep and a black sponge superficial.  He should have this changed every other day.  Continue with the clindamycin.  We will check a pre-albumin.  He needs to add zinc to his vitamin regimen.  He can shower when the [facility] exchanged, and we will work him up for the [place] for ACell placement and VAC.  We will see him back in a week.     [name] Kos, DO     CS/MEDQ  D:  01/19/2014  T:  01/19/2014  Job:  [id number]

## 2014-01-23 ENCOUNTER — Encounter (HOSPITAL_COMMUNITY): Payer: Self-pay | Admitting: *Deleted

## 2014-01-23 ENCOUNTER — Encounter (HOSPITAL_COMMUNITY): Payer: Self-pay | Admitting: Pharmacy Technician

## 2014-01-23 NOTE — Progress Notes (Signed)
No pre-op orders in EPIC. Called Dr. Leafy Ro office and left message with scheduler requesting orders.

## 2014-01-23 NOTE — Progress Notes (Signed)
Pt will need the bariatric stretcher.   Instructed pt to stop Arthrotec as of today prior to surgery.

## 2014-01-26 ENCOUNTER — Encounter: Payer: Self-pay | Admitting: Plastic Surgery

## 2014-01-26 ENCOUNTER — Ambulatory Visit (HOSPITAL_COMMUNITY): Payer: BC Managed Care – PPO | Admitting: Anesthesiology

## 2014-01-26 ENCOUNTER — Encounter (HOSPITAL_COMMUNITY): Payer: BC Managed Care – PPO | Admitting: Anesthesiology

## 2014-01-26 ENCOUNTER — Encounter (HOSPITAL_COMMUNITY): Payer: Self-pay | Admitting: Plastic Surgery

## 2014-01-26 ENCOUNTER — Other Ambulatory Visit: Payer: Self-pay | Admitting: Plastic Surgery

## 2014-01-26 ENCOUNTER — Ambulatory Visit (HOSPITAL_COMMUNITY): Payer: BC Managed Care – PPO

## 2014-01-26 ENCOUNTER — Encounter (HOSPITAL_COMMUNITY)
Admission: RE | Disposition: A | Discharge status: Home / Self Care | Payer: Self-pay | Source: Ambulatory Visit | Attending: Plastic Surgery

## 2014-01-26 ENCOUNTER — Ambulatory Visit (HOSPITAL_COMMUNITY)
Admission: RE | Admit: 2014-01-26 | Discharge: 2014-01-26 | Disposition: A | Discharge status: Home / Self Care | Payer: BC Managed Care – PPO | Source: Ambulatory Visit | Attending: Plastic Surgery | Admitting: Plastic Surgery

## 2014-01-26 DIAGNOSIS — I509 Heart failure, unspecified: Secondary | ICD-10-CM | POA: Insufficient documentation

## 2014-01-26 DIAGNOSIS — Z885 Allergy status to narcotic agent status: Secondary | ICD-10-CM | POA: Insufficient documentation

## 2014-01-26 DIAGNOSIS — Z862 Personal history of diseases of the blood and blood-forming organs and certain disorders involving the immune mechanism: Secondary | ICD-10-CM | POA: Diagnosis not present

## 2014-01-26 DIAGNOSIS — R51 Headache: Secondary | ICD-10-CM | POA: Insufficient documentation

## 2014-01-26 DIAGNOSIS — S31109A Unspecified open wound of abdominal wall, unspecified quadrant without penetration into peritoneal cavity, initial encounter: Secondary | ICD-10-CM | POA: Diagnosis present

## 2014-01-26 DIAGNOSIS — F411 Generalized anxiety disorder: Secondary | ICD-10-CM | POA: Diagnosis not present

## 2014-01-26 DIAGNOSIS — I10 Essential (primary) hypertension: Secondary | ICD-10-CM | POA: Diagnosis not present

## 2014-01-26 DIAGNOSIS — Z79899 Other long term (current) drug therapy: Secondary | ICD-10-CM | POA: Insufficient documentation

## 2014-01-26 DIAGNOSIS — F329 Major depressive disorder, single episode, unspecified: Secondary | ICD-10-CM | POA: Insufficient documentation

## 2014-01-26 DIAGNOSIS — Z880 Allergy status to penicillin: Secondary | ICD-10-CM | POA: Insufficient documentation

## 2014-01-26 DIAGNOSIS — G4730 Sleep apnea, unspecified: Secondary | ICD-10-CM | POA: Diagnosis not present

## 2014-01-26 DIAGNOSIS — S31109S Unspecified open wound of abdominal wall, unspecified quadrant without penetration into peritoneal cavity, sequela: Secondary | ICD-10-CM

## 2014-01-26 DIAGNOSIS — Y838 Other surgical procedures as the cause of abnormal reaction of the patient, or of later complication, without mention of misadventure at the time of the procedure: Secondary | ICD-10-CM | POA: Insufficient documentation

## 2014-01-26 DIAGNOSIS — F3289 Other specified depressive episodes: Secondary | ICD-10-CM | POA: Diagnosis not present

## 2014-01-26 DIAGNOSIS — L98499 Non-pressure chronic ulcer of skin of other sites with unspecified severity: Secondary | ICD-10-CM | POA: Diagnosis not present

## 2014-01-26 DIAGNOSIS — M109 Gout, unspecified: Secondary | ICD-10-CM | POA: Insufficient documentation

## 2014-01-26 DIAGNOSIS — M1710 Unilateral primary osteoarthritis, unspecified knee: Secondary | ICD-10-CM | POA: Insufficient documentation

## 2014-01-26 DIAGNOSIS — Z8639 Personal history of other endocrine, nutritional and metabolic disease: Secondary | ICD-10-CM | POA: Insufficient documentation

## 2014-01-26 DIAGNOSIS — Z6841 Body Mass Index (BMI) 40.0 and over, adult: Secondary | ICD-10-CM | POA: Insufficient documentation

## 2014-01-26 HISTORY — DX: Neoplasm of unspecified behavior of endocrine glands and other parts of nervous system: D49.7

## 2014-01-26 HISTORY — DX: Unspecified osteoarthritis, unspecified site: M19.90

## 2014-01-26 HISTORY — DX: Personal history of other diseases of the respiratory system: Z87.09

## 2014-01-26 HISTORY — DX: Anxiety disorder, unspecified: F41.9

## 2014-01-26 HISTORY — DX: Depression, unspecified: F32.A

## 2014-01-26 HISTORY — PX: INCISION AND DRAINAGE OF WOUND: SHX1803

## 2014-01-26 HISTORY — DX: Morbid (severe) obesity due to excess calories: E66.01

## 2014-01-26 HISTORY — DX: Major depressive disorder, single episode, unspecified: F32.9

## 2014-01-26 HISTORY — PX: APPLICATION OF A-CELL OF CHEST/ABDOMEN: SHX6302

## 2014-01-26 LAB — BASIC METABOLIC PANEL
Anion gap: 12 (ref 5–15)
BUN: 15 mg/dL (ref 6–23)
CO2: 25 mEq/L (ref 19–32)
Calcium: 9.3 mg/dL (ref 8.4–10.5)
Chloride: 106 mEq/L (ref 96–112)
Creatinine, Ser: 0.93 mg/dL (ref 0.50–1.35)
GFR calc Af Amer: >90 mL/min (ref >90)
GFR calc non Af Amer: >90 mL/min (ref >90)
Glucose, Bld: 91 mg/dL (ref 70–99)
Potassium: 4.7 mEq/L (ref 3.7–5.3)
Sodium: 143 mEq/L (ref 137–147)

## 2014-01-26 LAB — CBC
HCT: 37 % — ABNORMAL LOW (ref 39.0–52.0)
Hemoglobin: 11.6 g/dL — ABNORMAL LOW (ref 13.0–17.0)
MCH: 27.3 pg (ref 26.0–34.0)
MCHC: 31.4 g/dL (ref 30.0–36.0)
MCV: 87.1 fL (ref 78.0–100.0)
Platelets: 171 10*3/uL (ref 150–400)
RBC: 4.25 MIL/uL (ref 4.22–5.81)
RDW: 17 % — ABNORMAL HIGH (ref 11.5–15.5)
WBC: 6.6 10*3/uL (ref 4.0–10.5)

## 2014-01-26 LAB — SURGICAL PCR SCREEN
MRSA, PCR: POSITIVE — AB
Staphylococcus aureus: POSITIVE — AB

## 2014-01-26 SURGERY — IRRIGATION AND DEBRIDEMENT WOUND
Anesthesia: General | Site: Abdomen

## 2014-01-26 MED ORDER — CIPROFLOXACIN IN D5W 400 MG/200ML IV SOLN
400.0000 mg | INTRAVENOUS | Status: AC
  Administered 2014-01-26: 400 mg via INTRAVENOUS
  Filled 2014-01-26: qty 200

## 2014-01-26 MED ORDER — FENTANYL CITRATE 0.05 MG/ML IJ SOLN
INTRAMUSCULAR | Status: AC
  Filled 2014-01-26: qty 5

## 2014-01-26 MED ORDER — SUCCINYLCHOLINE CHLORIDE 20 MG/ML IJ SOLN
INTRAMUSCULAR | Status: DC | PRN
  Administered 2014-01-26: 100 mg via INTRAVENOUS

## 2014-01-26 MED ORDER — LACTATED RINGERS IV SOLN
INTRAVENOUS | Status: DC | PRN
  Administered 2014-01-26: 15:00:00 via INTRAVENOUS

## 2014-01-26 MED ORDER — SODIUM CHLORIDE 0.9 % IR SOLN
Status: DC | PRN
  Administered 2014-01-26: 1000 mL

## 2014-01-26 MED ORDER — EPHEDRINE SULFATE 50 MG/ML IJ SOLN
INTRAMUSCULAR | Status: DC | PRN
  Administered 2014-01-26 (×3): 10 mg via INTRAVENOUS

## 2014-01-26 MED ORDER — MUPIROCIN 2 % EX OINT
1.0000 "application " | TOPICAL_OINTMENT | Freq: Once | CUTANEOUS | Status: AC
  Administered 2014-01-26: 1 via TOPICAL

## 2014-01-26 MED ORDER — PROPOFOL 10 MG/ML IV BOLUS
INTRAVENOUS | Status: DC | PRN
  Administered 2014-01-26: 160 mg via INTRAVENOUS

## 2014-01-26 MED ORDER — PROMETHAZINE HCL 25 MG/ML IJ SOLN: 6.2500 mg | INTRAMUSCULAR | Status: DC | PRN

## 2014-01-26 MED ORDER — ONDANSETRON HCL 4 MG/2ML IJ SOLN
INTRAMUSCULAR | Status: DC | PRN
  Administered 2014-01-26: 4 mg via INTRAVENOUS

## 2014-01-26 MED ORDER — ONDANSETRON HCL 4 MG/2ML IJ SOLN
4.0000 mg | Freq: Once | INTRAMUSCULAR | Status: AC
  Administered 2014-01-26: 4 mg via INTRAVENOUS

## 2014-01-26 MED ORDER — HYDROMORPHONE HCL PF 1 MG/ML IJ SOLN
0.2500 mg | INTRAMUSCULAR | Status: DC | PRN
  Administered 2014-01-26 (×3): 0.5 mg via INTRAVENOUS

## 2014-01-26 MED ORDER — MIDAZOLAM HCL 5 MG/5ML IJ SOLN
INTRAMUSCULAR | Status: DC | PRN
  Administered 2014-01-26: 2 mg via INTRAVENOUS

## 2014-01-26 MED ORDER — HYDROMORPHONE HCL PF 1 MG/ML IJ SOLN
INTRAMUSCULAR | Status: AC
  Filled 2014-01-26: qty 1

## 2014-01-26 MED ORDER — LIDOCAINE HCL (CARDIAC) 20 MG/ML IV SOLN
INTRAVENOUS | Status: DC | PRN
  Administered 2014-01-26: 80 mg via INTRAVENOUS

## 2014-01-26 MED ORDER — PROPOFOL 10 MG/ML IV BOLUS
INTRAVENOUS | Status: AC
  Filled 2014-01-26: qty 20

## 2014-01-26 MED ORDER — MUPIROCIN 2 % EX OINT
TOPICAL_OINTMENT | CUTANEOUS | Status: AC
  Filled 2014-01-26: qty 22

## 2014-01-26 MED ORDER — SODIUM CHLORIDE 0.9 % IR SOLN
Freq: Once | Status: DC
  Filled 2014-01-26: qty 1

## 2014-01-26 MED ORDER — OXYCODONE HCL 5 MG PO TABS: 5.0000 mg | ORAL_TABLET | Freq: Once | ORAL | Status: DC | PRN

## 2014-01-26 MED ORDER — OXYCODONE HCL 5 MG/5ML PO SOLN: 5.0000 mg | Freq: Once | ORAL | Status: DC | PRN

## 2014-01-26 MED ORDER — ONDANSETRON HCL 4 MG/2ML IJ SOLN
INTRAMUSCULAR | Status: AC
Start: 2014-01-26 — End: 2014-01-26
  Filled 2014-01-26: qty 2

## 2014-01-26 MED ORDER — MIDAZOLAM HCL 2 MG/2ML IJ SOLN
INTRAMUSCULAR | Status: AC
  Filled 2014-01-26: qty 2

## 2014-01-26 MED ORDER — LACTATED RINGERS IV SOLN
INTRAVENOUS | Status: DC
  Administered 2014-01-26: 14:00:00 via INTRAVENOUS

## 2014-01-26 MED ORDER — FENTANYL CITRATE 0.05 MG/ML IJ SOLN
INTRAMUSCULAR | Status: DC | PRN
  Administered 2014-01-26 (×3): 100 ug via INTRAVENOUS
  Administered 2014-01-26: 50 ug via INTRAVENOUS

## 2014-01-26 MED ORDER — ONDANSETRON HCL 4 MG/2ML IJ SOLN
INTRAMUSCULAR | Status: AC
  Filled 2014-01-26: qty 2

## 2014-01-26 MED ORDER — MEPERIDINE HCL 25 MG/ML IJ SOLN: 6.2500 mg | INTRAMUSCULAR | Status: DC | PRN

## 2014-01-26 SURGICAL SUPPLY — 48 items
BAG DECANTER FOR FLEXI CONT (MISCELLANEOUS) IMPLANT
BLADE 10 SAFETY STRL DISP (BLADE) ×3 IMPLANT
BLADE SURG ROTATE 9660 (MISCELLANEOUS) IMPLANT
BNDG GAUZE ELAST 4 BULKY (GAUZE/BANDAGES/DRESSINGS) IMPLANT
CANISTER SUCTION 2500CC (MISCELLANEOUS) ×3 IMPLANT
CHLORAPREP W/TINT 26ML (MISCELLANEOUS) IMPLANT
CONT SPEC STER OR (MISCELLANEOUS) IMPLANT
COVER SURGICAL LIGHT HANDLE (MISCELLANEOUS) ×3 IMPLANT
DRAPE INCISE IOBAN 66X45 STRL (DRAPES) IMPLANT
DRAPE PED LAPAROTOMY (DRAPES) ×3 IMPLANT
DRAPE PROXIMA HALF (DRAPES) IMPLANT
DRSG ADAPTIC 3X8 NADH LF (GAUZE/BANDAGES/DRESSINGS) ×3 IMPLANT
DRSG EMULSION OIL 3X3 NADH (GAUZE/BANDAGES/DRESSINGS) IMPLANT
DRSG PAD ABDOMINAL 8X10 ST (GAUZE/BANDAGES/DRESSINGS) ×3 IMPLANT
DRSG VAC ATS LRG SENSATRAC (GAUZE/BANDAGES/DRESSINGS) IMPLANT
DRSG VAC ATS MED SENSATRAC (GAUZE/BANDAGES/DRESSINGS) IMPLANT
DRSG VAC ATS SM SENSATRAC (GAUZE/BANDAGES/DRESSINGS) IMPLANT
ELECT CAUTERY BLADE 6.4 (BLADE) ×3 IMPLANT
ELECT REM PT RETURN 9FT ADLT (ELECTROSURGICAL) ×3
ELECTRODE REM PT RTRN 9FT ADLT (ELECTROSURGICAL) ×1 IMPLANT
GAUZE SPONGE 4X4 12PLY STRL (GAUZE/BANDAGES/DRESSINGS) ×3 IMPLANT
GAUZE SPONGE 4X4 16PLY XRAY LF (GAUZE/BANDAGES/DRESSINGS) ×3 IMPLANT
GLOVE BIO SURGEON STRL SZ 6.5 (GLOVE) ×2 IMPLANT
GLOVE BIO SURGEONS STRL SZ 6.5 (GLOVE) ×1
GOWN STRL REUS W/ TWL LRG LVL3 (GOWN DISPOSABLE) ×2 IMPLANT
GOWN STRL REUS W/TWL LRG LVL3 (GOWN DISPOSABLE) ×4
KIT BASIN OR (CUSTOM PROCEDURE TRAY) ×3 IMPLANT
KIT ROOM TURNOVER OR (KITS) ×3 IMPLANT
MATRIX SURGICAL PSM 7X10CM (Tissue) ×6 IMPLANT
MICROMATRIX 1000MG (Tissue) ×6 IMPLANT
NS IRRIG 1000ML POUR BTL (IV SOLUTION) ×3 IMPLANT
PACK GENERAL/GYN (CUSTOM PROCEDURE TRAY) ×3 IMPLANT
PACK SURGICAL SETUP 50X90 (CUSTOM PROCEDURE TRAY) ×3 IMPLANT
PAD ARMBOARD 7.5X6 YLW CONV (MISCELLANEOUS) ×6 IMPLANT
PENCIL BUTTON HOLSTER BLD 10FT (ELECTRODE) ×3 IMPLANT
SOLUTION PARTIC MCRMTRX 1000MG (Tissue) ×2 IMPLANT
SURGILUBE 2OZ TUBE FLIPTOP (MISCELLANEOUS) IMPLANT
SUT CHROMIC 4 0 P 3 18 (SUTURE) IMPLANT
SUT ETHILON 4 0 PS 2 18 (SUTURE) IMPLANT
SUT ETHILON 5 0 P 3 18 (SUTURE)
SUT NYLON ETHILON 5-0 P-3 1X18 (SUTURE) IMPLANT
SUT VIC AB 5-0 PS2 18 (SUTURE) ×9 IMPLANT
SWAB COLLECTION DEVICE MRSA (MISCELLANEOUS) IMPLANT
SYR BULB 3OZ (MISCELLANEOUS) IMPLANT
TOWEL OR 17X24 6PK STRL BLUE (TOWEL DISPOSABLE) ×3 IMPLANT
TOWEL OR 17X26 10 PK STRL BLUE (TOWEL DISPOSABLE) ×3 IMPLANT
TUBE ANAEROBIC SPECIMEN COL (MISCELLANEOUS) IMPLANT
UNDERPAD 30X30 INCONTINENT (UNDERPADS AND DIAPERS) ×3 IMPLANT

## 2014-01-26 NOTE — Anesthesia Preprocedure Evaluation (Signed)
Anesthesia Evaluation  Patient identified by MRN, date of birth, ID band Patient awake    Reviewed: Allergy & Precautions, H&P , NPO status , Patient's Chart, lab work & pertinent test results  Airway Mallampati: II TM Distance: <3 FB Neck ROM: Full    Dental  (+) Teeth Intact, Dental Advisory Given   Pulmonary sleep apnea and Continuous Positive Airway Pressure Ventilation ,          Cardiovascular hypertension, Pt. on medications +CHF     Neuro/Psych  Headaches, PSYCHIATRIC DISORDERS Anxiety Depression  Neuromuscular disease    GI/Hepatic negative GI ROS, Neg liver ROS,   Endo/Other  Morbid obesity  Renal/GU Renal diseasenegative Renal ROS  negative genitourinary   Musculoskeletal negative musculoskeletal ROS (+)   Abdominal   Peds negative pediatric ROS (+)  Hematology negative hematology ROS (+)   Anesthesia Other Findings Pituitary Tumor  Reproductive/Obstetrics                           Anesthesia Physical  Anesthesia Plan  ASA: IV  Anesthesia Plan: General   Post-op Pain Management:    Induction: Intravenous  Airway Management Planned: LMA and Oral ETT  Additional Equipment:   Intra-op Plan:   Post-operative Plan: Possible Post-op intubation/ventilation and Extubation in OR  Informed Consent: I have reviewed the patients History and Physical, chart, labs and discussed the procedure including the risks, benefits and alternatives for the proposed anesthesia with the patient or authorized representative who has indicated his/her understanding and acceptance.   Dental advisory given  Plan Discussed with: CRNA  Anesthesia Plan Comments:         Anesthesia Quick Evaluation

## 2014-01-26 NOTE — OR Nursing (Signed)
Bug Juice exp 01/27/14 given in operating room by Dr Bernadette Hoit

## 2014-01-26 NOTE — Interval H&P Note (Signed)
History and Physical Interval Note:  01/26/2014 11:12 AM  [name] [name]  has presented today for surgery, with the diagnosis of abdominal wound   The various methods of treatment have been discussed with the patient and family. After consideration of risks, benefits and other options for treatment, the patient has consented to  Procedure(s): IRRIGATION AND DEBRIDEMENT ABDOMINAL WOUND WITH  (N/A) PLACEMENT OF A CELL AND VAC  (N/A) as a surgical intervention .  The patient's history has been reviewed, patient examined, no change in status, stable for surgery.  I have reviewed the patient's chart and labs.  Questions were answered to the patient's satisfaction.     [name],[name]

## 2014-01-26 NOTE — H&P (View-Only) (Signed)
[facility]  NAME:  [name] [name], [name] [name] NO.:  [id number]  MEDICAL RECORD NO.:  [id number]      DATE OF BIRTH:  31-Mar-1966  PHYSICIAN:  [name], DO       VISIT DATE:  01/19/2014                                  OFFICE VISIT   HISTORY OF PRESENT ILLNESS:  The patient is a 48 year old male who is here for followup on his large abdominal wound.  He has been using the [facility] on it with some improvement but it has been a problem for at least 6 months when he has an abdominal abscess that was drained and the area has not cleaned.  PAST MEDICAL HISTORY:  Positive for: 1. Chronic sinus problems. 2. Sleep apnea. 3. Hypertension. 4. Gout. 5. Osteoarthritis. 6. Neuropathy. 7. Diverticulitis. 8. Depression.  SURGERIES:  Lithotripsy, Achilles tendon repair on both sides, abdominal abscess removal, and a colon resection for diverticulitis.  MEDICATION ALLERGIES:  PENICILLIN and MORPHINE.  MEDICATIONS:  Wellbutrin, Coreg, Flexeril, allopurinol, Arthrotec, Flonase, Lasix, hydrocortisone, Gralise, probiotics, lidocaine as needed, loratadine, potassium, Sudafed, and clindamycin.  SOCIAL HISTORY:  He lives at home and has a strong family support.  He is not a smoker, and he is not diabetic. He does use a walker.  REVIEW OF SYSTEMS:  Positive for cellulitis around the area.  PHYSICAL EXAMINATION:  GENERAL:  He is alert, oriented, not in any acute distress.  He is pleasant for the exam accompanied by his mother. HEENT:  Pupils are equal.  Extraocular muscles are intact. NECK:  He does not have any cervical lymphadenopathy. ABDOMEN:  He has an extremely large abdomen.  No organomegaly palpable.  His wound is extremely deep and those notes are noted in the chart at 11.3 x 9.7 x 2.4 for the wound.  It does look clean.  He has surrounding periwound cellulitis but that has apparently gotten better over the last week whereby Dr. Shelia Media had given him  clindamycin.  His lower extremities are swollen as well but pulses are present and recommendation is to continue with the VAC.  We will use a white sponge deep and a black sponge superficial.  He should have this changed every other day.  Continue with the clindamycin.  We will check a pre-albumin.  He needs to add zinc to his vitamin regimen.  He can shower when the [facility] exchanged, and we will work him up for the [place] for ACell placement and VAC.  We will see him back in a week.     [name], DO     CS/MEDQ  D:  01/19/2014  T:  01/19/2014  Job:  [id number]

## 2014-01-26 NOTE — Op Note (Signed)
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   COMPLICATIONS: None.   INDICATIONS FOR PROCEDURE:  The patient, [name] [name] is a 48 y.o. male born on Jun 13, 1965, is here for treatment of an abdominal wall chronic ulceration. MRN: [id number]  CONSENT:  Informed consent was obtained directly from the patient. Risks, benefits and alternatives were fully discussed. Specific risks including but not limited to bleeding, infection, hematoma, seroma, scarring, pain, infection, contracture, asymmetry, wound healing problems, and need for further surgery were all discussed. The patient did have an ample opportunity to have questions answered to satisfaction.   DESCRIPTION OF PROCEDURE:  The patient was taken to the operating room. SCDs were placed and IV antibiotics were given. The patient's operative site was prepped and draped in a sterile fashion. A time out was performed and all information was confirmed to be correct.  General anesthesia was administered.  The wound was irrigated with antibiotic solution.  The wound was debrided with a currette. The Acell powder and sheet were applied. The sheet was secured with 5-0 Vicryl. The adaptic was placed and the VAC sponge. There was an excellent seal.  The patient tolerated the procedure well.  There were no complications. The patient was allowed to wake from anesthesia, extubated and taken to the recovery room in satisfactory condition.

## 2014-01-26 NOTE — Transfer of Care (Signed)
Immediate Anesthesia Transfer of Care Note  Patient: [name] [name]  Procedure(s) Performed: Procedure(s): IRRIGATION AND DEBRIDEMENT ABDOMINAL WOUND WITH  (N/A) PLACEMENT OF A CELL AND VAC  (N/A)  Patient Location: PACU  Anesthesia Type:General  Level of Consciousness: awake, alert , oriented and patient cooperative  Airway & Oxygen Therapy: Patient Spontanous Breathing and Patient connected to nasal cannula oxygen  Post-op Assessment: Report given to PACU RN, Post -op Vital signs reviewed and stable and Patient moving all extremities  Post vital signs: Reviewed and stable  Complications: No apparent anesthesia complications

## 2014-01-26 NOTE — Anesthesia Postprocedure Evaluation (Signed)
  Anesthesia Post-op Note  Patient: [name] [name]  Procedure(s) Performed: Procedure(s): IRRIGATION AND DEBRIDEMENT ABDOMINAL WOUND WITH  (N/A) PLACEMENT OF A CELL AND VAC  (N/A)  Patient Location: PACU  Anesthesia Type:General  Level of Consciousness: awake  Airway and Oxygen Therapy: Patient Spontanous Breathing  Post-op Pain: moderate  Post-op Assessment: Post-op Vital signs reviewed, Patient's Cardiovascular Status Stable, Respiratory Function Stable, Patent Airway, No signs of Nausea or vomiting and Pain level controlled  Post-op Vital Signs: Reviewed and stable  Last Vitals:  Filed Vitals:   01/26/14 1745  BP:   Pulse: 86  Temp:   Resp: 21    Complications: No apparent anesthesia complications

## 2014-01-26 NOTE — Brief Op Note (Signed)
01/26/2014  3:44 PM  PATIENT:  Delon Ormond  48 y.o. male  PRE-OPERATIVE DIAGNOSIS:  abdominal wound   POST-OPERATIVE DIAGNOSIS:  abdominal wound  PROCEDURE:  Procedure(s): IRRIGATION AND DEBRIDEMENT ABDOMINAL WOUND WITH  (N/A) PLACEMENT OF A CELL AND VAC  (N/A)  SURGEON:  Surgeon(s) and Role:    * Shirl Weir [name], DO - Primary  PHYSICIAN ASSISTANT: Shawn Rayburn, PA  ASSISTANTS: none   ANESTHESIA:   general  EBL:     BLOOD ADMINISTERED:none  DRAINS: none   LOCAL MEDICATIONS USED:  NONE  SPECIMEN:  No Specimen  DISPOSITION OF SPECIMEN:  N/A  COUNTS:  YES  TOURNIQUET:  * No tourniquets in log *  DICTATION: .Dragon Dictation  PLAN OF CARE: Discharge to home after PACU  PATIENT DISPOSITION:  PACU - hemodynamically stable.   Delay start of Pharmacological VTE agent (>24hrs) due to surgical blood loss or risk of bleeding: no

## 2014-01-26 NOTE — Discharge Instructions (Addendum)
Continue VAC Do not change VAC  What to eat:  For your first meals, you should eat lightly; only small meals initially.  If you do not have nausea, you may eat larger meals.  Avoid spicy, greasy and heavy food.    General Anesthesia, Adult, Care After  Refer to this sheet in the next few weeks. These instructions provide you with information on caring for yourself after your procedure. Your health care provider may also give you more specific instructions. Your treatment has been planned according to current medical practices, but problems sometimes occur. Call your health care provider if you have any problems or questions after your procedure.  WHAT TO EXPECT AFTER THE PROCEDURE  After the procedure, it is typical to experience:  Sleepiness.  Nausea and vomiting. HOME CARE INSTRUCTIONS  For the first 24 hours after general anesthesia:  Have a responsible person with you.  Do not drive a car. If you are alone, do not take public transportation.  Do not drink alcohol.  Do not take medicine that has not been prescribed by your health care provider.  Do not sign important papers or make important decisions.  You may resume a normal diet and activities as directed by your health care provider.  Change bandages (dressings) as directed.  If you have questions or problems that seem related to general anesthesia, call the hospital and ask for the anesthetist or anesthesiologist on call. SEEK MEDICAL CARE IF:  You have nausea and vomiting that continue the day after anesthesia.  You develop a rash. SEEK IMMEDIATE MEDICAL CARE IF:  You have difficulty breathing.  You have chest pain.  You have any allergic problems. Document Released: 08/28/2000 Document Revised: 01/22/2013 Document Reviewed: 12/05/2012  [facility] Patient Information 2014 [place], [place].

## 2014-01-27 ENCOUNTER — Encounter (HOSPITAL_COMMUNITY): Payer: Self-pay | Admitting: Plastic Surgery

## 2014-01-30 ENCOUNTER — Encounter (HOSPITAL_COMMUNITY): Payer: Self-pay | Admitting: *Deleted

## 2014-02-02 ENCOUNTER — Ambulatory Visit (HOSPITAL_COMMUNITY): Payer: BC Managed Care – PPO | Admitting: Anesthesiology

## 2014-02-02 ENCOUNTER — Encounter (HOSPITAL_COMMUNITY): Payer: BC Managed Care – PPO | Admitting: Anesthesiology

## 2014-02-02 ENCOUNTER — Encounter (HOSPITAL_COMMUNITY)
Admission: RE | Disposition: A | Discharge status: Home / Self Care | Payer: Self-pay | Source: Ambulatory Visit | Attending: Plastic Surgery

## 2014-02-02 ENCOUNTER — Telehealth: Payer: Self-pay | Admitting: Radiation Oncology

## 2014-02-02 ENCOUNTER — Ambulatory Visit (HOSPITAL_COMMUNITY)
Admission: RE | Admit: 2014-02-02 | Discharge: 2014-02-02 | Disposition: A | Discharge status: Home / Self Care | Payer: BC Managed Care – PPO | Source: Ambulatory Visit | Attending: Plastic Surgery | Admitting: Plastic Surgery

## 2014-02-02 ENCOUNTER — Encounter (HOSPITAL_COMMUNITY): Payer: Self-pay | Admitting: Plastic Surgery

## 2014-02-02 DIAGNOSIS — F411 Generalized anxiety disorder: Secondary | ICD-10-CM | POA: Diagnosis not present

## 2014-02-02 DIAGNOSIS — F329 Major depressive disorder, single episode, unspecified: Secondary | ICD-10-CM | POA: Diagnosis not present

## 2014-02-02 DIAGNOSIS — I10 Essential (primary) hypertension: Secondary | ICD-10-CM | POA: Diagnosis not present

## 2014-02-02 DIAGNOSIS — X58XXXA Exposure to other specified factors, initial encounter: Secondary | ICD-10-CM | POA: Insufficient documentation

## 2014-02-02 DIAGNOSIS — L98499 Non-pressure chronic ulcer of skin of other sites with unspecified severity: Secondary | ICD-10-CM | POA: Diagnosis not present

## 2014-02-02 DIAGNOSIS — F3289 Other specified depressive episodes: Secondary | ICD-10-CM | POA: Insufficient documentation

## 2014-02-02 DIAGNOSIS — E039 Hypothyroidism, unspecified: Secondary | ICD-10-CM | POA: Insufficient documentation

## 2014-02-02 DIAGNOSIS — S31109A Unspecified open wound of abdominal wall, unspecified quadrant without penetration into peritoneal cavity, initial encounter: Secondary | ICD-10-CM | POA: Insufficient documentation

## 2014-02-02 DIAGNOSIS — Z6841 Body Mass Index (BMI) 40.0 and over, adult: Secondary | ICD-10-CM | POA: Diagnosis not present

## 2014-02-02 DIAGNOSIS — M109 Gout, unspecified: Secondary | ICD-10-CM | POA: Diagnosis not present

## 2014-02-02 DIAGNOSIS — M1990 Unspecified osteoarthritis, unspecified site: Secondary | ICD-10-CM | POA: Diagnosis not present

## 2014-02-02 DIAGNOSIS — G4730 Sleep apnea, unspecified: Secondary | ICD-10-CM | POA: Diagnosis not present

## 2014-02-02 DIAGNOSIS — Z79899 Other long term (current) drug therapy: Secondary | ICD-10-CM | POA: Insufficient documentation

## 2014-02-02 HISTORY — DX: Other specified postprocedural states: Z98.890

## 2014-02-02 HISTORY — PX: INCISION AND DRAINAGE OF WOUND: SHX1803

## 2014-02-02 HISTORY — PX: APPLICATION OF A-CELL OF CHEST/ABDOMEN: SHX6302

## 2014-02-02 HISTORY — DX: Nausea with vomiting, unspecified: R11.2

## 2014-02-02 SURGERY — IRRIGATION AND DEBRIDEMENT WOUND
Anesthesia: General

## 2014-02-02 MED ORDER — HYDROMORPHONE HCL PF 1 MG/ML IJ SOLN
INTRAMUSCULAR | Status: AC
  Administered 2014-02-02: 0.5 mg via INTRAVENOUS
  Filled 2014-02-02: qty 1

## 2014-02-02 MED ORDER — FENTANYL CITRATE 0.05 MG/ML IJ SOLN
INTRAMUSCULAR | Status: AC
  Administered 2014-02-02: 50 ug
  Filled 2014-02-02: qty 2

## 2014-02-02 MED ORDER — ONDANSETRON HCL 4 MG/2ML IJ SOLN
4.0000 mg | Freq: Once | INTRAMUSCULAR | Status: DC
  Filled 2014-02-02: qty 2

## 2014-02-02 MED ORDER — FENTANYL CITRATE 0.05 MG/ML IJ SOLN
INTRAMUSCULAR | Status: AC
  Filled 2014-02-02: qty 5

## 2014-02-02 MED ORDER — LACTATED RINGERS IV SOLN
INTRAVENOUS | Status: DC | PRN
  Administered 2014-02-02: 14:00:00 via INTRAVENOUS

## 2014-02-02 MED ORDER — ONDANSETRON HCL 4 MG/2ML IJ SOLN
INTRAMUSCULAR | Status: DC | PRN
  Administered 2014-02-02: 4 mg via INTRAVENOUS

## 2014-02-02 MED ORDER — OXYCODONE HCL 5 MG PO TABS
ORAL_TABLET | ORAL | Status: AC
  Filled 2014-02-02: qty 1

## 2014-02-02 MED ORDER — MIDAZOLAM HCL 5 MG/5ML IJ SOLN
INTRAMUSCULAR | Status: DC | PRN
  Administered 2014-02-02: 2 mg via INTRAVENOUS

## 2014-02-02 MED ORDER — ONDANSETRON HCL 4 MG/2ML IJ SOLN: 4.0000 mg | Freq: Once | INTRAMUSCULAR | Status: DC | PRN

## 2014-02-02 MED ORDER — CIPROFLOXACIN IN D5W 400 MG/200ML IV SOLN
400.0000 mg | Freq: Two times a day (BID) | INTRAVENOUS | Status: DC
  Filled 2014-02-02: qty 200

## 2014-02-02 MED ORDER — OXYCODONE HCL 5 MG PO TABS: 5.0000 mg | ORAL_TABLET | Freq: Once | ORAL | Status: DC | PRN

## 2014-02-02 MED ORDER — OXYCODONE HCL 5 MG/5ML PO SOLN: 5.0000 mg | Freq: Once | ORAL | Status: DC | PRN

## 2014-02-02 MED ORDER — SODIUM CHLORIDE 0.9 % IR SOLN
Status: DC | PRN
  Administered 2014-02-02: 1000 mL

## 2014-02-02 MED ORDER — ONDANSETRON HCL 4 MG/2ML IJ SOLN
INTRAMUSCULAR | Status: AC
  Administered 2014-02-02: 4 mg
  Filled 2014-02-02: qty 2

## 2014-02-02 MED ORDER — MIDAZOLAM HCL 2 MG/2ML IJ SOLN
INTRAMUSCULAR | Status: AC
Start: 2014-02-02 — End: 2014-02-02
  Filled 2014-02-02: qty 2

## 2014-02-02 MED ORDER — DEXAMETHASONE SODIUM PHOSPHATE 4 MG/ML IJ SOLN
INTRAMUSCULAR | Status: DC | PRN
  Administered 2014-02-02: 4 mg via INTRAVENOUS

## 2014-02-02 MED ORDER — PROPOFOL 10 MG/ML IV BOLUS
INTRAVENOUS | Status: AC
  Filled 2014-02-02: qty 20

## 2014-02-02 MED ORDER — ONDANSETRON HCL 4 MG/2ML IJ SOLN
INTRAMUSCULAR | Status: AC
  Filled 2014-02-02: qty 2

## 2014-02-02 MED ORDER — LACTATED RINGERS IV SOLN
INTRAVENOUS | Status: DC
  Administered 2014-02-02: 13:00:00 via INTRAVENOUS

## 2014-02-02 MED ORDER — SODIUM CHLORIDE 0.9 % IR SOLN
Status: DC | PRN
  Administered 2014-02-02: 16:00:00

## 2014-02-02 MED ORDER — PROPOFOL 10 MG/ML IV BOLUS
INTRAVENOUS | Status: DC | PRN
Start: 2014-02-02 — End: 2014-02-02
  Administered 2014-02-02: 200 mg via INTRAVENOUS
  Administered 2014-02-02: 50 mg via INTRAVENOUS

## 2014-02-02 MED ORDER — CIPROFLOXACIN IN D5W 400 MG/200ML IV SOLN
400.0000 mg | INTRAVENOUS | Status: DC
  Filled 2014-02-02: qty 200

## 2014-02-02 MED ORDER — CIPROFLOXACIN IN D5W 400 MG/200ML IV SOLN
400.0000 mg | INTRAVENOUS | Status: AC
  Administered 2014-02-02: 400 mg via INTRAVENOUS
  Filled 2014-02-02: qty 200

## 2014-02-02 MED ORDER — FENTANYL CITRATE 0.05 MG/ML IJ SOLN
INTRAMUSCULAR | Status: DC | PRN
  Administered 2014-02-02 (×2): 50 ug via INTRAVENOUS

## 2014-02-02 MED ORDER — DEXAMETHASONE SODIUM PHOSPHATE 4 MG/ML IJ SOLN
INTRAMUSCULAR | Status: AC
Start: 2014-02-02 — End: 2014-02-02
  Filled 2014-02-02: qty 1

## 2014-02-02 MED ORDER — HYDROMORPHONE HCL PF 1 MG/ML IJ SOLN
0.2500 mg | INTRAMUSCULAR | Status: DC | PRN
  Administered 2014-02-02: 0.5 mg via INTRAVENOUS

## 2014-02-02 MED ORDER — MEPERIDINE HCL 25 MG/ML IJ SOLN
6.2500 mg | INTRAMUSCULAR | Status: DC | PRN
Start: 2014-02-02 — End: 2014-02-02

## 2014-02-02 MED ORDER — LIDOCAINE HCL (CARDIAC) 20 MG/ML IV SOLN
INTRAVENOUS | Status: DC | PRN
  Administered 2014-02-02: 100 mg via INTRAVENOUS

## 2014-02-02 SURGICAL SUPPLY — 48 items
BAG DECANTER FOR FLEXI CONT (MISCELLANEOUS) IMPLANT
BLADE 10 SAFETY STRL DISP (BLADE) ×3 IMPLANT
BLADE SURG ROTATE 9660 (MISCELLANEOUS) IMPLANT
BNDG GAUZE ELAST 4 BULKY (GAUZE/BANDAGES/DRESSINGS) IMPLANT
CANISTER SUCTION 2500CC (MISCELLANEOUS) ×3 IMPLANT
CHLORAPREP W/TINT 26ML (MISCELLANEOUS) IMPLANT
CONT SPEC STER OR (MISCELLANEOUS) IMPLANT
COVER SURGICAL LIGHT HANDLE (MISCELLANEOUS) ×3 IMPLANT
DRAPE INCISE IOBAN 66X45 STRL (DRAPES) IMPLANT
DRAPE PED LAPAROTOMY (DRAPES) ×3 IMPLANT
DRAPE PROXIMA HALF (DRAPES) IMPLANT
DRSG ADAPTIC 3X8 NADH LF (GAUZE/BANDAGES/DRESSINGS) ×3 IMPLANT
DRSG EMULSION OIL 3X3 NADH (GAUZE/BANDAGES/DRESSINGS) IMPLANT
DRSG PAD ABDOMINAL 8X10 ST (GAUZE/BANDAGES/DRESSINGS) ×3 IMPLANT
DRSG VAC ATS LRG SENSATRAC (GAUZE/BANDAGES/DRESSINGS) IMPLANT
DRSG VAC ATS MED SENSATRAC (GAUZE/BANDAGES/DRESSINGS) IMPLANT
DRSG VAC ATS SM SENSATRAC (GAUZE/BANDAGES/DRESSINGS) IMPLANT
ELECT CAUTERY BLADE 6.4 (BLADE) ×3 IMPLANT
ELECT REM PT RETURN 9FT ADLT (ELECTROSURGICAL) ×3
ELECTRODE REM PT RTRN 9FT ADLT (ELECTROSURGICAL) ×1 IMPLANT
GAUZE SPONGE 4X4 12PLY STRL (GAUZE/BANDAGES/DRESSINGS) ×3 IMPLANT
GAUZE SPONGE 4X4 16PLY XRAY LF (GAUZE/BANDAGES/DRESSINGS) ×3 IMPLANT
GLOVE BIO SURGEON STRL SZ 6.5 (GLOVE) ×2 IMPLANT
GLOVE BIO SURGEONS STRL SZ 6.5 (GLOVE) ×1
GOWN STRL REUS W/ TWL LRG LVL3 (GOWN DISPOSABLE) ×2 IMPLANT
GOWN STRL REUS W/TWL LRG LVL3 (GOWN DISPOSABLE) ×4
KIT BASIN OR (CUSTOM PROCEDURE TRAY) ×3 IMPLANT
KIT ROOM TURNOVER OR (KITS) ×3 IMPLANT
MATRIX SURGICAL PSM 10X15CM (Tissue) ×3 IMPLANT
MICROMATRIX 1000MG (Tissue) ×6 IMPLANT
NS IRRIG 1000ML POUR BTL (IV SOLUTION) ×3 IMPLANT
PACK GENERAL/GYN (CUSTOM PROCEDURE TRAY) ×3 IMPLANT
PACK SURGICAL SETUP 50X90 (CUSTOM PROCEDURE TRAY) ×3 IMPLANT
PAD ARMBOARD 7.5X6 YLW CONV (MISCELLANEOUS) ×6 IMPLANT
PENCIL BUTTON HOLSTER BLD 10FT (ELECTRODE) ×3 IMPLANT
SOLUTION PARTIC MCRMTRX 1000MG (Tissue) ×2 IMPLANT
SURGILUBE 2OZ TUBE FLIPTOP (MISCELLANEOUS) IMPLANT
SUT CHROMIC 4 0 P 3 18 (SUTURE) IMPLANT
SUT ETHILON 4 0 PS 2 18 (SUTURE) IMPLANT
SUT ETHILON 5 0 P 3 18 (SUTURE)
SUT NYLON ETHILON 5-0 P-3 1X18 (SUTURE) IMPLANT
SUT VIC AB 5-0 PS2 18 (SUTURE) IMPLANT
SWAB COLLECTION DEVICE MRSA (MISCELLANEOUS) IMPLANT
SYR BULB 3OZ (MISCELLANEOUS) IMPLANT
TOWEL OR 17X24 6PK STRL BLUE (TOWEL DISPOSABLE) ×3 IMPLANT
TOWEL OR 17X26 10 PK STRL BLUE (TOWEL DISPOSABLE) ×3 IMPLANT
TUBE ANAEROBIC SPECIMEN COL (MISCELLANEOUS) IMPLANT
UNDERPAD 30X30 INCONTINENT (UNDERPADS AND DIAPERS) ×3 IMPLANT

## 2014-02-02 NOTE — Interval H&P Note (Signed)
History and Physical Interval Note:  02/02/2014 7:16 AM  D'Arcy Lacour  has presented today for surgery, with the diagnosis of ABDOMINAL WOUND   The various methods of treatment have been discussed with the patient and family. After consideration of risks, benefits and other options for treatment, the patient has consented to  Procedure(s): IRRIGATION AND DEBRIDEMENT ABDOMINAL WOUND (N/A) WITH PLACEMENT OF A CELL AND VAC  (N/A) as a surgical intervention .  The patient's history has been reviewed, patient examined, no change in status, stable for surgery.  I have reviewed the patient's chart and labs.  Questions were answered to the patient's satisfaction.     [name],Merek Niu

## 2014-02-02 NOTE — Brief Op Note (Signed)
02/02/2014  3:51 PM  PATIENT:  [name] [name]  48 y.o. male  PRE-OPERATIVE DIAGNOSIS:  ABDOMINAL WOUND   POST-OPERATIVE DIAGNOSIS:  ABDOMINAL WOUND   PROCEDURE:  Procedure(s): IRRIGATION AND DEBRIDEMENT ABDOMINAL WOUND (N/A) WITH PLACEMENT OF A CELL AND VAC  (N/A)  SURGEON:  Surgeon(s) and Role:    * Claire [name], DO - Primary  PHYSICIAN ASSISTANT: none  ASSISTANTS: none   ANESTHESIA:   general  EBL:     BLOOD ADMINISTERED:none  DRAINS: none   LOCAL MEDICATIONS USED:  NONE  SPECIMEN:  No Specimen  DISPOSITION OF SPECIMEN:  N/A  COUNTS:  YES  TOURNIQUET:  * No tourniquets in log *  DICTATION: .Dragon Dictation  PLAN OF CARE: Discharge to home after PACU  PATIENT DISPOSITION:  PACU - hemodynamically stable.   Delay start of Pharmacological VTE agent (>24hrs) due to surgical blood loss or risk of bleeding: no

## 2014-02-02 NOTE — Anesthesia Postprocedure Evaluation (Signed)
Anesthesia Post Note  Patient: [name] [name]  Procedure(s) Performed: Procedure(s) (LRB): IRRIGATION AND DEBRIDEMENT ABDOMINAL WOUND (N/A) WITH PLACEMENT OF A CELL AND VAC  (N/A)  Anesthesia type: general  Patient location: PACU  Post pain: Pain level controlled  Post assessment: Patient's Cardiovascular Status Stable  Last Vitals:  Filed Vitals:   02/02/14 1728  BP: 154/99  Pulse: 71  Temp:   Resp:     Post vital signs: Reviewed and stable  Level of consciousness: sedated  Complications: No apparent anesthesia complications

## 2014-02-02 NOTE — Op Note (Signed)
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   COMPLICATIONS: None.   INDICATIONS FOR PROCEDURE:  The patient, [name] [name] is a 48 y.o. male born on 1966-03-20, is here for treatment of an abdominal wall chronic ulceration. MRN: [id number]  CONSENT:  Informed consent was obtained directly from the patient. Risks, benefits and alternatives were fully discussed. Specific risks including but not limited to bleeding, infection, hematoma, seroma, scarring, pain, infection, contracture, asymmetry, wound healing problems, and need for further surgery were all discussed. The patient did have an ample opportunity to have questions answered to satisfaction.   DESCRIPTION OF PROCEDURE:  The patient was taken to the operating room. SCDs were placed and IV antibiotics were given. The patient's operative site was prepped and draped in a sterile fashion. A time out was performed and all information was confirmed to be correct.  General anesthesia was administered.  The wound was irrigated with antibiotic solution.  The wound was debrided with a currette. The Acell powder and sheet were applied. The sheet was secured with 5-0 Vicryl. The adaptic was placed and the VAC sponge. There was an excellent seal.  The patient tolerated the procedure well.  There were no complications. The patient was allowed to wake from anesthesia, extubated and taken to the recovery room in satisfactory condition.

## 2014-02-02 NOTE — Anesthesia Procedure Notes (Signed)
Procedure Name: LMA Insertion Date/Time: 02/02/2014 3:22 PM Performed by: Luciana Axe K Pre-anesthesia Checklist: Patient identified, Emergency Drugs available, Suction available, Patient being monitored and Timeout performed Patient Re-evaluated:Patient Re-evaluated prior to inductionOxygen Delivery Method: Circle system utilized Preoxygenation: Pre-oxygenation with 100% oxygen Intubation Type: IV induction Ventilation: Mask ventilation without difficulty LMA: LMA inserted LMA Size: 5.0 Number of attempts: 1 Placement Confirmation: positive ETCO2,  CO2 detector and breath sounds checked- equal and bilateral Tube secured with: Tape Dental Injury: Teeth and Oropharynx as per pre-operative assessment

## 2014-02-02 NOTE — H&P (View-Only) (Signed)
[facility]  NAME:  [name] [name], [name] [name] NO.:  [id number]  MEDICAL RECORD NO.:  [id number]      DATE OF BIRTH:  02-Nov-1965  PHYSICIAN:  [name], DO       VISIT DATE:  01/19/2014                                  OFFICE VISIT   HISTORY OF PRESENT ILLNESS:  The patient is a 48 year old male who is here for followup on his large abdominal wound.  He has been using the [facility] on it with some improvement but it has been a problem for at least 6 months when he has an abdominal abscess that was drained and the area has not cleaned.  PAST MEDICAL HISTORY:  Positive for: 1. Chronic sinus problems. 2. Sleep apnea. 3. Hypertension. 4. Gout. 5. Osteoarthritis. 6. Neuropathy. 7. Diverticulitis. 8. Depression.  SURGERIES:  Lithotripsy, Achilles tendon repair on both sides, abdominal abscess removal, and a colon resection for diverticulitis.  MEDICATION ALLERGIES:  PENICILLIN and MORPHINE.  MEDICATIONS:  Wellbutrin, Coreg, Flexeril, allopurinol, Arthrotec, Flonase, Lasix, hydrocortisone, Gralise, probiotics, lidocaine as needed, loratadine, potassium, Sudafed, and clindamycin.  SOCIAL HISTORY:  He lives at home and has a strong family support.  He is not a smoker, and he is not diabetic. He does use a walker.  REVIEW OF SYSTEMS:  Positive for cellulitis around the area.  PHYSICAL EXAMINATION:  GENERAL:  He is alert, oriented, not in any acute distress.  He is pleasant for the exam accompanied by his mother. HEENT:  Pupils are equal.  Extraocular muscles are intact. NECK:  He does not have any cervical lymphadenopathy. ABDOMEN:  He has an extremely large abdomen.  No organomegaly palpable.  His wound is extremely deep and those notes are noted in the chart at 11.3 x 9.7 x 2.4 for the wound.  It does look clean.  He has surrounding periwound cellulitis but that has apparently gotten better over the last week whereby Dr. Shelia Media had given him  clindamycin.  His lower extremities are swollen as well but pulses are present and recommendation is to continue with the VAC.  We will use a white sponge deep and a black sponge superficial.  He should have this changed every other day.  Continue with the clindamycin.  We will check a pre-albumin.  He needs to add zinc to his vitamin regimen.  He can shower when the [facility] exchanged, and we will work him up for the [place] for ACell placement and VAC.  We will see him back in a week.     [name], DO     CS/MEDQ  D:  01/19/2014  T:  01/19/2014  Job:  [id number]

## 2014-02-02 NOTE — Discharge Instructions (Signed)

## 2014-02-02 NOTE — Progress Notes (Signed)
Pt c/o nausea;spoke with Dr.Ossey who gave order for Zofran 4mg  IV

## 2014-02-02 NOTE — Transfer of Care (Signed)
Immediate Anesthesia Transfer of Care Note  Patient: [name] [name]  Procedure(s) Performed: Procedure(s): IRRIGATION AND DEBRIDEMENT ABDOMINAL WOUND (N/A) WITH PLACEMENT OF A CELL AND VAC  (N/A)  Patient Location: PACU  Anesthesia Type:General  Level of Consciousness: awake, alert , oriented and patient cooperative  Airway & Oxygen Therapy: Patient Spontanous Breathing and Patient connected to nasal cannula oxygen  Post-op Assessment: Report given to PACU RN and Post -op Vital signs reviewed and stable  Post vital signs: Reviewed  Complications: No apparent anesthesia complications

## 2014-02-02 NOTE — Anesthesia Preprocedure Evaluation (Addendum)
Anesthesia Evaluation  Patient identified by MRN, date of birth, ID band Patient awake    Reviewed: Allergy & Precautions, H&P , NPO status , Patient's Chart, lab work & pertinent test results  History of Anesthesia Complications (+) PONV and history of anesthetic complications  Airway Mallampati: III TM Distance: >3 FB Neck ROM: Full    Dental  (+) Teeth Intact, Dental Advisory Given   Pulmonary sleep apnea ,  breath sounds clear to auscultation        Cardiovascular hypertension, Pt. on medications +CHF Rhythm:Regular Rate:Normal     Neuro/Psych PSYCHIATRIC DISORDERS Anxiety Depression    GI/Hepatic negative GI ROS, Neg liver ROS,   Endo/Other  Hypothyroidism Morbid obesity  Renal/GU      Musculoskeletal   Abdominal   Peds  Hematology   Anesthesia Other Findings   Reproductive/Obstetrics                         Anesthesia Physical Anesthesia Plan  ASA: III  Anesthesia Plan: General   Post-op Pain Management:    Induction: Intravenous  Airway Management Planned: LMA  Additional Equipment:   Intra-op Plan:   Post-operative Plan: Extubation in OR  Informed Consent: I have reviewed the patients History and Physical, chart, labs and discussed the procedure including the risks, benefits and alternatives for the proposed anesthesia with the patient or authorized representative who has indicated his/her understanding and acceptance.     Plan Discussed with: CRNA and Surgeon  Anesthesia Plan Comments:         Anesthesia Quick Evaluation

## 2014-02-02 NOTE — Telephone Encounter (Signed)
Received message from [facility] of the front office that this patient had left a message that he "is now ready for radiation." Phoned patient to follow up. No answer. Left message on home/mobile phone requesting return call. Also, left message on patient's mother's phone. Awaiting return call. Patient scheduled to see Dr. Naaman Plummer on 9/4. Mont Dutton has asked staff of Dr. Naaman Plummer to obtain a weight. Awaiting return call. Understand the cap on the treatment table is 440 lb. Last documented weight for patient was 454 lb.

## 2014-02-03 ENCOUNTER — Telehealth: Payer: Self-pay | Admitting: Radiation Oncology

## 2014-02-03 ENCOUNTER — Encounter (HOSPITAL_COMMUNITY): Payer: Self-pay | Admitting: Plastic Surgery

## 2014-02-03 NOTE — Telephone Encounter (Signed)
[name] left message reporting that the patient's mother has questions and is requesting a call back. Phoned patient's mother, [name] [name], right back. No answer. Left message with contact number for call back.

## 2014-02-03 NOTE — Telephone Encounter (Signed)
Returned message left by patient's mother, Vickii Chafe. She questions the weight limit on the treatment table. Explained that 440 lb is the limit but, 420 lb is ideal. She reports she "feels like her son now weighs 420 lb or less." This writer verbalized to her that our staff had reached out to Dr. Naaman Plummer office, who has a 500 lb scale, knowing the patient had an appointment on 9/4. Verbalized Dr. Naaman Plummer staff planned to weight her son for [place]. She explained the appointment with Dr. Naaman Plummer for 9/4 had to be cancelled because of an appointment for his third  A CELL surgery with Dr. Migdalia Dk. Peggy plans to call Dr. Naaman Plummer office in the morning to reschedule the 9/4 appointment. Peggy understands to contact this Probation officer once a recorded weight is obtained. Peggy correctly read back this writer's contact information. In addition she reports her son continues to have a wound vac in an attempt to close a deep abdominal wound that has been present since January. Also, Vickii Chafe verbalizes that her son is unable to stand.

## 2014-02-03 NOTE — Telephone Encounter (Signed)
Spoke with [name] [name] concerning message received from [place], [place].  [name] [name] concerned about getting appointment scheduled for son.  Discussed her son's weight and limitations of the treatment machines and wondering if [name] [name] is supposed to come in to have weight checked.  Forwarded concerns to RN and asked her to call Patti.

## 2014-02-05 ENCOUNTER — Ambulatory Visit: Admit: 2014-02-05 | Payer: Self-pay | Admitting: Plastic Surgery

## 2014-02-05 SURGERY — IRRIGATION AND DEBRIDEMENT WOUND
Anesthesia: General | Site: Abdomen

## 2014-02-06 ENCOUNTER — Encounter: Payer: BC Managed Care – PPO | Admitting: Physical Medicine & Rehabilitation

## 2014-02-10 ENCOUNTER — Other Ambulatory Visit: Payer: Self-pay | Admitting: Plastic Surgery

## 2014-02-10 DIAGNOSIS — S31109S Unspecified open wound of abdominal wall, unspecified quadrant without penetration into peritoneal cavity, sequela: Secondary | ICD-10-CM

## 2014-02-11 ENCOUNTER — Encounter (HOSPITAL_COMMUNITY): Payer: Self-pay | Admitting: *Deleted

## 2014-02-11 MED ORDER — CIPROFLOXACIN IN D5W 400 MG/200ML IV SOLN
400.0000 mg | INTRAVENOUS | Status: AC
  Administered 2014-02-12: 400 mg via INTRAVENOUS
  Filled 2014-02-11: qty 200

## 2014-02-12 ENCOUNTER — Ambulatory Visit (HOSPITAL_COMMUNITY)
Admission: RE | Admit: 2014-02-12 | Discharge: 2014-02-12 | Disposition: A | Discharge status: Home / Self Care | Payer: BC Managed Care – PPO | Source: Ambulatory Visit | Attending: Plastic Surgery | Admitting: Plastic Surgery

## 2014-02-12 ENCOUNTER — Encounter (HOSPITAL_COMMUNITY)
Admission: RE | Disposition: A | Discharge status: Home / Self Care | Payer: Self-pay | Source: Ambulatory Visit | Attending: Plastic Surgery

## 2014-02-12 ENCOUNTER — Encounter (HOSPITAL_COMMUNITY): Payer: Self-pay | Admitting: Pharmacy Technician

## 2014-02-12 ENCOUNTER — Ambulatory Visit (HOSPITAL_COMMUNITY): Payer: BC Managed Care – PPO | Admitting: Anesthesiology

## 2014-02-12 ENCOUNTER — Encounter (HOSPITAL_COMMUNITY): Payer: BC Managed Care – PPO | Admitting: Anesthesiology

## 2014-02-12 ENCOUNTER — Encounter (HOSPITAL_COMMUNITY): Payer: Self-pay | Admitting: Plastic Surgery

## 2014-02-12 DIAGNOSIS — I10 Essential (primary) hypertension: Secondary | ICD-10-CM | POA: Diagnosis not present

## 2014-02-12 DIAGNOSIS — M1990 Unspecified osteoarthritis, unspecified site: Secondary | ICD-10-CM | POA: Insufficient documentation

## 2014-02-12 DIAGNOSIS — F329 Major depressive disorder, single episode, unspecified: Secondary | ICD-10-CM | POA: Diagnosis not present

## 2014-02-12 DIAGNOSIS — Z885 Allergy status to narcotic agent status: Secondary | ICD-10-CM | POA: Insufficient documentation

## 2014-02-12 DIAGNOSIS — F3289 Other specified depressive episodes: Secondary | ICD-10-CM | POA: Diagnosis not present

## 2014-02-12 DIAGNOSIS — G4730 Sleep apnea, unspecified: Secondary | ICD-10-CM | POA: Diagnosis not present

## 2014-02-12 DIAGNOSIS — Z880 Allergy status to penicillin: Secondary | ICD-10-CM | POA: Diagnosis not present

## 2014-02-12 DIAGNOSIS — L98499 Non-pressure chronic ulcer of skin of other sites with unspecified severity: Secondary | ICD-10-CM | POA: Insufficient documentation

## 2014-02-12 DIAGNOSIS — L03319 Cellulitis of trunk, unspecified: Secondary | ICD-10-CM | POA: Diagnosis not present

## 2014-02-12 DIAGNOSIS — M109 Gout, unspecified: Secondary | ICD-10-CM | POA: Insufficient documentation

## 2014-02-12 DIAGNOSIS — L02219 Cutaneous abscess of trunk, unspecified: Secondary | ICD-10-CM | POA: Insufficient documentation

## 2014-02-12 DIAGNOSIS — G589 Mononeuropathy, unspecified: Secondary | ICD-10-CM | POA: Diagnosis not present

## 2014-02-12 DIAGNOSIS — S31109S Unspecified open wound of abdominal wall, unspecified quadrant without penetration into peritoneal cavity, sequela: Secondary | ICD-10-CM

## 2014-02-12 HISTORY — PX: INCISION AND DRAINAGE OF WOUND: SHX1803

## 2014-02-12 LAB — CBC
HCT: 40 % (ref 39.0–52.0)
Hemoglobin: 12.5 g/dL — ABNORMAL LOW (ref 13.0–17.0)
MCH: 27.7 pg (ref 26.0–34.0)
MCHC: 31.3 g/dL (ref 30.0–36.0)
MCV: 88.5 fL (ref 78.0–100.0)
Platelets: 178 10*3/uL (ref 150–400)
RBC: 4.52 MIL/uL (ref 4.22–5.81)
RDW: 16 % — ABNORMAL HIGH (ref 11.5–15.5)
WBC: 6.9 10*3/uL (ref 4.0–10.5)

## 2014-02-12 LAB — BASIC METABOLIC PANEL
Anion gap: 14 (ref 5–15)
BUN: 16 mg/dL (ref 6–23)
CO2: 23 mEq/L (ref 19–32)
Calcium: 9.3 mg/dL (ref 8.4–10.5)
Chloride: 108 mEq/L (ref 96–112)
Creatinine, Ser: 0.89 mg/dL (ref 0.50–1.35)
GFR calc Af Amer: >90 mL/min (ref >90)
GFR calc non Af Amer: >90 mL/min (ref >90)
Glucose, Bld: 103 mg/dL — ABNORMAL HIGH (ref 70–99)
Potassium: 3.9 mEq/L (ref 3.7–5.3)
Sodium: 145 mEq/L (ref 137–147)

## 2014-02-12 SURGERY — IRRIGATION AND DEBRIDEMENT WOUND
Anesthesia: General | Site: Abdomen

## 2014-02-12 MED ORDER — SODIUM CHLORIDE 0.9 % IR SOLN
Status: DC | PRN
  Administered 2014-02-12: 1000 mL

## 2014-02-12 MED ORDER — HYDROMORPHONE HCL PF 1 MG/ML IJ SOLN
INTRAMUSCULAR | Status: AC
  Filled 2014-02-12: qty 1

## 2014-02-12 MED ORDER — ONDANSETRON HCL 4 MG/2ML IJ SOLN
INTRAMUSCULAR | Status: DC | PRN
  Administered 2014-02-12: 4 mg via INTRAVENOUS

## 2014-02-12 MED ORDER — PROPOFOL 10 MG/ML IV BOLUS
INTRAVENOUS | Status: AC
  Filled 2014-02-12: qty 20

## 2014-02-12 MED ORDER — DEXAMETHASONE SODIUM PHOSPHATE 4 MG/ML IJ SOLN
INTRAMUSCULAR | Status: AC
  Filled 2014-02-12: qty 1

## 2014-02-12 MED ORDER — SUCCINYLCHOLINE CHLORIDE 20 MG/ML IJ SOLN
INTRAMUSCULAR | Status: AC
  Filled 2014-02-12: qty 1

## 2014-02-12 MED ORDER — FENTANYL CITRATE 0.05 MG/ML IJ SOLN
INTRAMUSCULAR | Status: AC
  Filled 2014-02-12: qty 5

## 2014-02-12 MED ORDER — ONDANSETRON HCL 4 MG/2ML IJ SOLN
INTRAMUSCULAR | Status: AC
  Filled 2014-02-12: qty 2

## 2014-02-12 MED ORDER — FENTANYL CITRATE 0.05 MG/ML IJ SOLN
INTRAMUSCULAR | Status: AC
Start: 2014-02-12 — End: 2014-02-12
  Filled 2014-02-12: qty 5

## 2014-02-12 MED ORDER — LACTATED RINGERS IV SOLN
INTRAVENOUS | Status: DC
  Administered 2014-02-12: 10:00:00 via INTRAVENOUS

## 2014-02-12 MED ORDER — LACTATED RINGERS IV SOLN
INTRAVENOUS | Status: DC | PRN
  Administered 2014-02-12: 11:00:00 via INTRAVENOUS

## 2014-02-12 MED ORDER — PROPOFOL 10 MG/ML IV BOLUS
INTRAVENOUS | Status: DC | PRN
  Administered 2014-02-12: 60 mg via INTRAVENOUS
  Administered 2014-02-12: 200 mg via INTRAVENOUS
  Administered 2014-02-12: 250 mg via INTRAVENOUS

## 2014-02-12 MED ORDER — FENTANYL CITRATE 0.05 MG/ML IJ SOLN
INTRAMUSCULAR | Status: DC | PRN
  Administered 2014-02-12: 50 ug via INTRAVENOUS
  Administered 2014-02-12: 75 ug via INTRAVENOUS

## 2014-02-12 MED ORDER — HYDROMORPHONE HCL PF 1 MG/ML IJ SOLN
0.2500 mg | INTRAMUSCULAR | Status: DC | PRN
  Administered 2014-02-12 (×4): 0.5 mg via INTRAVENOUS

## 2014-02-12 MED ORDER — MIDAZOLAM HCL 5 MG/5ML IJ SOLN
INTRAMUSCULAR | Status: DC | PRN
  Administered 2014-02-12: 2 mg via INTRAVENOUS

## 2014-02-12 MED ORDER — LIDOCAINE HCL (CARDIAC) 20 MG/ML IV SOLN
INTRAVENOUS | Status: AC
  Filled 2014-02-12: qty 5

## 2014-02-12 MED ORDER — SODIUM CHLORIDE 0.9 % IR SOLN
Status: DC | PRN
  Administered 2014-02-12: 12:00:00

## 2014-02-12 MED ORDER — LIDOCAINE HCL (CARDIAC) 20 MG/ML IV SOLN
INTRAVENOUS | Status: DC | PRN
  Administered 2014-02-12: 75 mg via INTRAVENOUS
  Administered 2014-02-12: 100 mg via INTRAVENOUS

## 2014-02-12 MED ORDER — DEXAMETHASONE SODIUM PHOSPHATE 4 MG/ML IJ SOLN
INTRAMUSCULAR | Status: DC | PRN
  Administered 2014-02-12: 4 mg via INTRAVENOUS

## 2014-02-12 MED ORDER — MIDAZOLAM HCL 2 MG/2ML IJ SOLN
INTRAMUSCULAR | Status: AC
  Filled 2014-02-12: qty 2

## 2014-02-12 SURGICAL SUPPLY — 48 items
BAG DECANTER FOR FLEXI CONT (MISCELLANEOUS) IMPLANT
BLADE 10 SAFETY STRL DISP (BLADE) ×3 IMPLANT
BLADE SURG ROTATE 9660 (MISCELLANEOUS) IMPLANT
BNDG GAUZE ELAST 4 BULKY (GAUZE/BANDAGES/DRESSINGS) IMPLANT
CANISTER SUCTION 2500CC (MISCELLANEOUS) ×3 IMPLANT
CHLORAPREP W/TINT 26ML (MISCELLANEOUS) IMPLANT
CONT SPEC STER OR (MISCELLANEOUS) IMPLANT
COVER SURGICAL LIGHT HANDLE (MISCELLANEOUS) ×3 IMPLANT
DRAPE INCISE IOBAN 66X45 STRL (DRAPES) IMPLANT
DRAPE PED LAPAROTOMY (DRAPES) ×3 IMPLANT
DRAPE PROXIMA HALF (DRAPES) IMPLANT
DRSG ADAPTIC 3X8 NADH LF (GAUZE/BANDAGES/DRESSINGS) IMPLANT
DRSG PAD ABDOMINAL 8X10 ST (GAUZE/BANDAGES/DRESSINGS) ×3 IMPLANT
DRSG VAC ATS LRG SENSATRAC (GAUZE/BANDAGES/DRESSINGS) IMPLANT
DRSG VAC ATS MED SENSATRAC (GAUZE/BANDAGES/DRESSINGS) IMPLANT
DRSG VAC ATS SM SENSATRAC (GAUZE/BANDAGES/DRESSINGS) IMPLANT
ELECT CAUTERY BLADE 6.4 (BLADE) ×3 IMPLANT
ELECT REM PT RETURN 9FT ADLT (ELECTROSURGICAL) ×3
ELECTRODE REM PT RTRN 9FT ADLT (ELECTROSURGICAL) ×1 IMPLANT
GAUZE SPONGE 4X4 12PLY STRL (GAUZE/BANDAGES/DRESSINGS) ×3 IMPLANT
GLOVE BIO SURGEON STRL SZ 6.5 (GLOVE) ×2 IMPLANT
GLOVE BIO SURGEONS STRL SZ 6.5 (GLOVE) ×1
GLOVE BIOGEL PI IND STRL 6.5 (GLOVE) ×1 IMPLANT
GLOVE BIOGEL PI IND STRL 7.0 (GLOVE) ×1 IMPLANT
GLOVE BIOGEL PI IND STRL 7.5 (GLOVE) ×1 IMPLANT
GLOVE BIOGEL PI INDICATOR 6.5 (GLOVE) ×2
GLOVE BIOGEL PI INDICATOR 7.0 (GLOVE) ×2
GLOVE BIOGEL PI INDICATOR 7.5 (GLOVE) ×2
GLOVE ECLIPSE 6.5 STRL STRAW (GLOVE) ×3 IMPLANT
GOWN STRL REUS W/ TWL LRG LVL3 (GOWN DISPOSABLE) ×2 IMPLANT
GOWN STRL REUS W/TWL LRG LVL3 (GOWN DISPOSABLE) ×4
KIT BASIN OR (CUSTOM PROCEDURE TRAY) ×3 IMPLANT
KIT ROOM TURNOVER OR (KITS) ×3 IMPLANT
MATRIX SURGICAL PSM 7X10CM (Tissue) ×3 IMPLANT
MICROMATRIX 1000MG (Tissue) ×3 IMPLANT
MICROMATRIX 500MG (Tissue) ×3 IMPLANT
NS IRRIG 1000ML POUR BTL (IV SOLUTION) ×3 IMPLANT
PACK GENERAL/GYN (CUSTOM PROCEDURE TRAY) ×3 IMPLANT
PAD ARMBOARD 7.5X6 YLW CONV (MISCELLANEOUS) ×6 IMPLANT
SOLUTION PARTIC MCRMTRX 1000MG (Tissue) ×1 IMPLANT
SOLUTION PARTIC MCRMTRX 500MG (Tissue) ×1 IMPLANT
SURGILUBE 2OZ TUBE FLIPTOP (MISCELLANEOUS) IMPLANT
SUT VIC AB 5-0 PS2 18 (SUTURE) ×3 IMPLANT
SWAB COLLECTION DEVICE MRSA (MISCELLANEOUS) IMPLANT
TOWEL OR 17X24 6PK STRL BLUE (TOWEL DISPOSABLE) ×3 IMPLANT
TOWEL OR 17X26 10 PK STRL BLUE (TOWEL DISPOSABLE) ×3 IMPLANT
TUBE ANAEROBIC SPECIMEN COL (MISCELLANEOUS) IMPLANT
UNDERPAD 30X30 INCONTINENT (UNDERPADS AND DIAPERS) ×3 IMPLANT

## 2014-02-12 NOTE — Op Note (Signed)
Operative Note   DATE OF OPERATION: 02/12/2014  LOCATION: [facility] outpatient   SURGICAL DIVISION: Plastic Surgery  PREOPERATIVE DIAGNOSES:  Chronic Abdominal wall ulceration (10 x 3 x 8 cm)   POSTOPERATIVE DIAGNOSES:  same  PROCEDURE:  Preparation of abdominal wound with placement of Acell (7  X 10 cm sheets and 1.5 gm) and VAC   SURGEON: [employer], DO  ASSISTANT: Shawn Rayburn, PA  ANESTHESIA:  General.   COMPLICATIONS: None.   INDICATIONS FOR PROCEDURE:  The patient, [name] [name] is a 48 y.o. male born on 05/09/1966, is here for treatment of an abdominal wall chronic ulceration. MRN: [id number]  CONSENT:  Informed consent was obtained directly from the patient. Risks, benefits and alternatives were fully discussed. Specific risks including but not limited to bleeding, infection, hematoma, seroma, scarring, pain, infection, contracture, asymmetry, wound healing problems, and need for further surgery were all discussed. The patient did have an ample opportunity to have questions answered to satisfaction.   DESCRIPTION OF PROCEDURE:  The patient was taken to the operating room. SCDs were placed and IV antibiotics were given. The patient's operative site was prepped and draped in a sterile fashion. A time out was performed and all information was confirmed to be correct.  General anesthesia was administered.  The wound was irrigated with antibiotic solution.  The wound was debrided with a currette. The Acell powder and sheet were applied. The sheet was secured with 5-0 Vicryl. The adaptic was placed and the VAC sponge. There was an excellent seal.  The patient tolerated the procedure well.  There were no complications. The patient was allowed to wake from anesthesia, extubated and taken to the recovery room in satisfactory condition.

## 2014-02-12 NOTE — Anesthesia Procedure Notes (Signed)
Procedure Name: LMA Insertion Date/Time: 02/12/2014 11:36 AM Performed by: Trixie Deis A Pre-anesthesia Checklist: Patient identified, Timeout performed, Suction available, Emergency Drugs available and Patient being monitored Patient Re-evaluated:Patient Re-evaluated prior to inductionOxygen Delivery Method: Circle system utilized Preoxygenation: Pre-oxygenation with 100% oxygen Intubation Type: IV induction LMA: LMA inserted LMA Size: 5.0 Number of attempts: 1 Placement Confirmation: positive ETCO2 and breath sounds checked- equal and bilateral Tube secured with: Tape Dental Injury: Teeth and Oropharynx as per pre-operative assessment

## 2014-02-12 NOTE — H&P (View-Only) (Signed)
[facility]  NAME:  [name] [name], [name] [name] NO.:  [id number]  MEDICAL RECORD NO.:  [id number]      DATE OF BIRTH:  November 16, 1965  PHYSICIAN:  [name], DO       VISIT DATE:  01/19/2014                                  OFFICE VISIT   HISTORY OF PRESENT ILLNESS:  The patient is a 48 year old male who is here for followup on his large abdominal wound.  He has been using the [facility] on it with some improvement but it has been a problem for at least 6 months when he has an abdominal abscess that was drained and the area has not cleaned.  PAST MEDICAL HISTORY:  Positive for: 1. Chronic sinus problems. 2. Sleep apnea. 3. Hypertension. 4. Gout. 5. Osteoarthritis. 6. Neuropathy. 7. Diverticulitis. 8. Depression.  SURGERIES:  Lithotripsy, Achilles tendon repair on both sides, abdominal abscess removal, and a colon resection for diverticulitis.  MEDICATION ALLERGIES:  PENICILLIN and MORPHINE.  MEDICATIONS:  Wellbutrin, Coreg, Flexeril, allopurinol, Arthrotec, Flonase, Lasix, hydrocortisone, Gralise, probiotics, lidocaine as needed, loratadine, potassium, Sudafed, and clindamycin.  SOCIAL HISTORY:  He lives at home and has a strong family support.  He is not a smoker, and he is not diabetic. He does use a walker.  REVIEW OF SYSTEMS:  Positive for cellulitis around the area.  PHYSICAL EXAMINATION:  GENERAL:  He is alert, oriented, not in any acute distress.  He is pleasant for the exam accompanied by his mother. HEENT:  Pupils are equal.  Extraocular muscles are intact. NECK:  He does not have any cervical lymphadenopathy. ABDOMEN:  He has an extremely large abdomen.  No organomegaly palpable.  His wound is extremely deep and those notes are noted in the chart at 11.3 x 9.7 x 2.4 for the wound.  It does look clean.  He has surrounding periwound cellulitis but that has apparently gotten better over the last week whereby Dr. Shelia Media had given him  clindamycin.  His lower extremities are swollen as well but pulses are present and recommendation is to continue with the VAC.  We will use a white sponge deep and a black sponge superficial.  He should have this changed every other day.  Continue with the clindamycin.  We will check a pre-albumin.  He needs to add zinc to his vitamin regimen.  He can shower when the [facility] exchanged, and we will work him up for the [place] for ACell placement and VAC.  We will see him back in a week.     [name], DO     CS/MEDQ  D:  01/19/2014  T:  01/19/2014  Job:  [id number]

## 2014-02-12 NOTE — Transfer of Care (Signed)
Immediate Anesthesia Transfer of Care Note  Patient: [name] [name]  Procedure(s) Performed: Procedure(s): IRRIGATION AND DEBRIDEMENT OF ABDOMINAL WOUND/PLACEMENT OF A-CELL AND VAC (N/A)  Patient Location: PACU  Anesthesia Type:General  Level of Consciousness: awake, alert  and oriented  Airway & Oxygen Therapy: Patient Spontanous Breathing and Patient connected to nasal cannula oxygen  Post-op Assessment: Report given to PACU RN, Post -op Vital signs reviewed and stable and Patient moving all extremities  Post vital signs: Reviewed and stable  Complications: No apparent anesthesia complications

## 2014-02-12 NOTE — Interval H&P Note (Signed)
History and Physical Interval Note:  02/12/2014 7:28 AM  [name] [name]  has presented today for surgery, with the diagnosis of ABDOMINAL WOUND  The various methods of treatment have been discussed with the patient and family. After consideration of risks, benefits and other options for treatment, the patient has consented to  Procedure(s): IRRIGATION AND DEBRIDEMENT OF ABDOMINAL WOUND/PLACEMENT OF A-CELL AND VAC (N/A) as a surgical intervention .  The patient's history has been reviewed, patient examined, no change in status, stable for surgery.  I have reviewed the patient's chart and labs.  Questions were answered to the patient's satisfaction.     [name],Acsa Estey

## 2014-02-12 NOTE — Anesthesia Postprocedure Evaluation (Signed)
  Anesthesia Post-op Note  Patient: [name] [name]  Procedure(s) Performed: Procedure(s): IRRIGATION AND DEBRIDEMENT OF ABDOMINAL WOUND/PLACEMENT OF A-CELL AND VAC (N/A)  Patient Location: PACU  Anesthesia Type:General  Level of Consciousness: awake and alert   Airway and Oxygen Therapy: Patient Spontanous Breathing  Post-op Pain: mild  Post-op Assessment: Post-op Vital signs reviewed, Patient's Cardiovascular Status Stable, Respiratory Function Stable, Patent Airway, No signs of Nausea or vomiting and Pain level controlled  Post-op Vital Signs: Reviewed and stable  Last Vitals:  Filed Vitals:   02/12/14 1330  BP: 147/85  Pulse:   Temp:   Resp:     Complications: No apparent anesthesia complications

## 2014-02-12 NOTE — Anesthesia Preprocedure Evaluation (Addendum)
Anesthesia Evaluation  Patient identified by MRN, date of birth, ID band Patient awake    Reviewed: Allergy & Precautions, H&P , NPO status , Patient's Chart, lab work & pertinent test results  History of Anesthesia Complications (+) PONV and history of anesthetic complications  Airway Mallampati: III TM Distance: >3 FB Neck ROM: Full    Dental  (+) Teeth Intact, Dental Advisory Given   Pulmonary sleep apnea ,  breath sounds clear to auscultation        Cardiovascular hypertension, Pt. on medications +CHF Rhythm:Regular Rate:Normal     Neuro/Psych  Headaches, PSYCHIATRIC DISORDERS Anxiety Depression  Neuromuscular disease    GI/Hepatic negative GI ROS, Neg liver ROS,   Endo/Other  Hypothyroidism Morbid obesity  Renal/GU      Musculoskeletal   Abdominal   Peds  Hematology   Anesthesia Other Findings   Reproductive/Obstetrics                          Anesthesia Physical  Anesthesia Plan  ASA: III  Anesthesia Plan: General   Post-op Pain Management:    Induction: Intravenous  Airway Management Planned: LMA  Additional Equipment:   Intra-op Plan:   Post-operative Plan: Extubation in OR  Informed Consent: I have reviewed the patients History and Physical, chart, labs and discussed the procedure including the risks, benefits and alternatives for the proposed anesthesia with the patient or authorized representative who has indicated his/her understanding and acceptance.     Plan Discussed with: CRNA and Surgeon  Anesthesia Plan Comments:         Anesthesia Quick Evaluation                                  Anesthesia Evaluation  Patient identified by MRN, date of birth, ID band Patient awake    Reviewed: Allergy & Precautions, H&P , NPO status , Patient's Chart, lab work & pertinent test results  History of Anesthesia Complications (+) PONV and history of anesthetic  complications  Airway Mallampati: III TM Distance: >3 FB Neck ROM: Full    Dental  (+) Teeth Intact, Dental Advisory Given   Pulmonary sleep apnea ,  breath sounds clear to auscultation        Cardiovascular hypertension, Pt. on medications +CHF Rhythm:Regular Rate:Normal     Neuro/Psych PSYCHIATRIC DISORDERS Anxiety Depression    GI/Hepatic negative GI ROS, Neg liver ROS,   Endo/Other  Hypothyroidism Morbid obesity  Renal/GU      Musculoskeletal   Abdominal   Peds  Hematology   Anesthesia Other Findings   Reproductive/Obstetrics                         Anesthesia Physical Anesthesia Plan  ASA: III  Anesthesia Plan: General   Post-op Pain Management:    Induction: Intravenous  Airway Management Planned: LMA  Additional Equipment:   Intra-op Plan:   Post-operative Plan: Extubation in OR  Informed Consent: I have reviewed the patients History and Physical, chart, labs and discussed the procedure including the risks, benefits and alternatives for the proposed anesthesia with the patient or authorized representative who has indicated his/her understanding and acceptance.     Plan Discussed with: CRNA and Surgeon  Anesthesia Plan Comments:         Anesthesia Quick Evaluation

## 2014-02-12 NOTE — Brief Op Note (Signed)
02/12/2014  12:16 PM  PATIENT:  [name] [name]  48 y.o. male  PRE-OPERATIVE DIAGNOSIS:  ABDOMINAL WOUND  POST-OPERATIVE DIAGNOSIS:  ABDOMINAL WOUND  PROCEDURE:  Procedure(s): IRRIGATION AND DEBRIDEMENT OF ABDOMINAL WOUND/PLACEMENT OF A-CELL AND VAC (N/A)  SURGEON:  Surgeon(s) and Role:    * Claire [name], DO - Primary  PHYSICIAN ASSISTANT: Shawn Rayburn, PA  ASSISTANTS: none   ANESTHESIA:   general  EBL:     BLOOD ADMINISTERED:none  DRAINS: none   LOCAL MEDICATIONS USED:  NONE  SPECIMEN:  No Specimen  DISPOSITION OF SPECIMEN:  N/A  COUNTS:  YES  TOURNIQUET:  * No tourniquets in log *  DICTATION: .Dragon Dictation  PLAN OF CARE: Discharge to home after PACU  PATIENT DISPOSITION:  PACU - hemodynamically stable.   Delay start of Pharmacological VTE agent (>24hrs) due to surgical blood loss or risk of bleeding: no

## 2014-02-12 NOTE — Discharge Instructions (Signed)
VAC Negative pressure to continue

## 2014-02-16 ENCOUNTER — Ambulatory Visit (INDEPENDENT_AMBULATORY_CARE_PROVIDER_SITE_OTHER): Payer: BC Managed Care – PPO | Admitting: Pulmonary Disease

## 2014-02-16 ENCOUNTER — Encounter (HOSPITAL_COMMUNITY): Payer: Self-pay | Admitting: Plastic Surgery

## 2014-02-16 ENCOUNTER — Other Ambulatory Visit: Payer: Self-pay | Admitting: Pulmonary Disease

## 2014-02-16 ENCOUNTER — Ambulatory Visit (INDEPENDENT_AMBULATORY_CARE_PROVIDER_SITE_OTHER)
Admission: RE | Admit: 2014-02-16 | Discharge: 2014-02-16 | Disposition: A | Discharge status: Home / Self Care | Payer: BC Managed Care – PPO | Source: Ambulatory Visit | Attending: Pulmonary Disease | Admitting: Pulmonary Disease

## 2014-02-16 VITALS — BP 134/92 | HR 91 | Ht 69.0 in | Wt >= 6400 oz

## 2014-02-16 DIAGNOSIS — J45909 Unspecified asthma, uncomplicated: Secondary | ICD-10-CM

## 2014-02-16 DIAGNOSIS — R0609 Other forms of dyspnea: Secondary | ICD-10-CM

## 2014-02-16 DIAGNOSIS — J9612 Chronic respiratory failure with hypercapnia: Secondary | ICD-10-CM

## 2014-02-16 DIAGNOSIS — R0600 Dyspnea, unspecified: Secondary | ICD-10-CM

## 2014-02-16 DIAGNOSIS — J9819 Other pulmonary collapse: Secondary | ICD-10-CM

## 2014-02-16 DIAGNOSIS — E662 Morbid (severe) obesity with alveolar hypoventilation: Secondary | ICD-10-CM

## 2014-02-16 DIAGNOSIS — J9610 Chronic respiratory failure, unspecified whether with hypoxia or hypercapnia: Secondary | ICD-10-CM

## 2014-02-16 DIAGNOSIS — J9811 Atelectasis: Secondary | ICD-10-CM

## 2014-02-16 DIAGNOSIS — G4733 Obstructive sleep apnea (adult) (pediatric): Secondary | ICD-10-CM

## 2014-02-16 DIAGNOSIS — R0989 Other specified symptoms and signs involving the circulatory and respiratory systems: Secondary | ICD-10-CM

## 2014-02-16 DIAGNOSIS — J4520 Mild intermittent asthma, uncomplicated: Secondary | ICD-10-CM

## 2014-02-16 LAB — PULMONARY FUNCTION TEST
DL/VA % pred: 116 %
DL/VA: 5.33 ml/min/mmHg/L
DLCO unc % pred: 88 %
DLCO unc: 27.51 ml/min/mmHg
FEF 25-75 Post: 2.73 L/sec
FEF 25-75 Pre: 1.84 L/sec
FEF2575-%Change-Post: 48 %
FEF2575-%Pred-Post: 81 %
FEF2575-%Pred-Pre: 54 %
FEV1-%Change-Post: 10 %
FEV1-%Pred-Post: 73 %
FEV1-%Pred-Pre: 66 %
FEV1-Post: 2.81 L
FEV1-Pre: 2.54 L
FEV1FVC-%Change-Post: 6 %
FEV1FVC-%Pred-Pre: 95 %
FEV6-%Change-Post: 4 %
FEV6-%Pred-Post: 75 %
FEV6-%Pred-Pre: 72 %
FEV6-Post: 3.55 L
FEV6-Pre: 3.4 L
FEV6FVC-%Change-Post: 0 %
FEV6FVC-%Pred-Post: 103 %
FEV6FVC-%Pred-Pre: 103 %
FVC-%Change-Post: 4 %
FVC-%Pred-Post: 72 %
FVC-%Pred-Pre: 69 %
FVC-Post: 3.56 L
FVC-Pre: 3.42 L
Post FEV1/FVC ratio: 79 %
Post FEV6/FVC ratio: 100 %
Pre FEV1/FVC ratio: 74 %
Pre FEV6/FVC Ratio: 99 %

## 2014-02-16 NOTE — Progress Notes (Signed)
PFT done today. 

## 2014-02-16 NOTE — Patient Instructions (Signed)
Chest xray today Will call with results of sleep study Follow up in 2 months

## 2014-02-16 NOTE — Progress Notes (Signed)
Chief Complaint  Patient presents with  . Follow-up    Pt here after PFT. Pt is having sleep study on 9/30. Pt states his breathing is unchanged. Pt c/o DOE. Pt denies cough and CP/tightness. Pt states he has not needed to use albuterol inhaler in 60 days. Pt recently removed benign brain tumor in 12/2013. Pt currently has wound vac on abdomen from cyst removal in Janurary. Pt had I and D on 02/12/14 of abdominal wound.     History of Present Illness: [name] [name] is a 48 y.o. male with wheezing with upper airway cough.  He also has OSA.  CXR from 10/21/13 showed ATX RLL.  CXR from 01/26/14 showed elevated Rt hemidiaphragm and ? Nodule.  ABG from 12/13/13 showed hypercapnia.  He was found to have benign brain tumor and had this removed.  He is due for XRT also.  His sinus congestion is better, and he no longer is using nasal sprays.   His wheezing and cough are better also.  He is not needing to use proair much.  His PFT showed borderline obstruction.   TESTS: Echo 12/19/13 >> EF 45 to 50%, mild LVH  Past medical hx, Past surgical hx, Medications, Allergies, Family hx, Social hx all reviewed.   Physical Exam:  General - No distress, in wheel chair ENT - No sinus tenderness, no oral exudate, no LAN Cardiac - s1s2 regular, no murmur Chest - No wheeze/rales/dullness Back - No focal tenderness Abd - wound vac in place Ext - 1+ edema Neuro - Normal strength Skin - No rashes Psych - normal mood, and behavior   Assessment/Plan:  Chesley Mires, MD [place] [facility] Pager:  [phone]

## 2014-02-17 ENCOUNTER — Telehealth: Payer: Self-pay | Admitting: Pulmonary Disease

## 2014-02-17 DIAGNOSIS — E662 Morbid (severe) obesity with alveolar hypoventilation: Secondary | ICD-10-CM | POA: Insufficient documentation

## 2014-02-17 DIAGNOSIS — J9811 Atelectasis: Secondary | ICD-10-CM | POA: Insufficient documentation

## 2014-02-17 NOTE — Assessment & Plan Note (Signed)
He has BiPAP from recent hospital stay and this is helping.  He has sleep study scheduled for later this month.

## 2014-02-17 NOTE — Assessment & Plan Note (Signed)
He can continue prn albuterol.

## 2014-02-17 NOTE — Telephone Encounter (Signed)
Spoke with patient-- aware of results.  Dr Halford Chessman also spoke with patient per pt request to clarify what this reading means for future treatment plans.  Nothing further needed.

## 2014-02-17 NOTE — Telephone Encounter (Signed)
Dg Chest 1 View  02/16/2014   CLINICAL DATA:  Short of breath.  Recent hospitalization.  EXAM: CHEST - 1 VIEW  COMPARISON:  01/26/2014.  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 No areas of lung consolidation. No pleural effusion or pneumothorax. Elevated right hemidiaphragm is stable.  Cardiac silhouette is normal in size. No mediastinal or hilar masses.  Bony thorax is demineralized but grossly intact.  IMPRESSION: 1. No acute findings and no change from the prior study. 2. 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 Recommend followup chest radiograph in 3 months to document longer term stability. Alternatively this could be assessed with CT for more conclusive characterization.   Electronically Signed   By: Lajean Manes M.D.   On: 02/16/2014 15:53    Attempted to call pt to discuss CXR results.  Will have my nurse inform pt that CXR shows nodules around lining of lung and elevated right diaphragm.  These are stable from CXR in August.  No change to current treatment plan.  He will need follow up CXR in 3 months to monitor these findings.

## 2014-02-17 NOTE — Assessment & Plan Note (Signed)
Continue BiPAP.  Work on weight loss.

## 2014-02-17 NOTE — Assessment & Plan Note (Signed)
Continue BiPAP.  

## 2014-02-17 NOTE — Assessment & Plan Note (Signed)
He has atelectasis and ? Nodule on CXR in hospital.  Will get CXR today and call him with results.

## 2014-02-18 ENCOUNTER — Encounter (HOSPITAL_BASED_OUTPATIENT_CLINIC_OR_DEPARTMENT_OTHER): Payer: BC Managed Care – PPO | Attending: Plastic Surgery

## 2014-02-18 DIAGNOSIS — L02219 Cutaneous abscess of trunk, unspecified: Secondary | ICD-10-CM | POA: Diagnosis not present

## 2014-02-18 DIAGNOSIS — L03319 Cellulitis of trunk, unspecified: Secondary | ICD-10-CM | POA: Diagnosis not present

## 2014-02-23 DIAGNOSIS — L02219 Cutaneous abscess of trunk, unspecified: Secondary | ICD-10-CM | POA: Diagnosis not present

## 2014-02-24 NOTE — Progress Notes (Signed)
[facility]  NAME:  [name] [name], [name] [name] NO.:  MEDICAL RECORD NO.:  [id number]      DATE OF BIRTH:  1965-09-06  PHYSICIAN:  [name], DO            VISIT DATE:                                  OFFICE VISIT   The patient is a 48 year old male who is here for followup on his abdominal ulcer secondary to an abscess.  Overall, he is doing much better.  The wound is markedly improved in its depth and width, but the periwound area is very red.  In order to give that a break, we will do wet-to-dries for few days and he is to do that twice a day with TCA to the surrounding skin and then come back on Thursday for placement of the VAC.     [name], DO     CS/MEDQ  D:  02/23/2014  T:  02/23/2014  Job:  [id number]

## 2014-02-26 DIAGNOSIS — L02219 Cutaneous abscess of trunk, unspecified: Secondary | ICD-10-CM | POA: Diagnosis not present

## 2014-02-27 ENCOUNTER — Telehealth: Payer: Self-pay | Admitting: Radiation Oncology

## 2014-02-27 NOTE — Telephone Encounter (Signed)
Phoned patient's mother, Chong Sicilian, to assess her son's status. She reports her son is still unable to stand. She reports he continues to endure A cell surgery twice per week. She reports his abdominal wound is healing well with A cell and the wound vac. She reports they plan to attend his appointment on Tuesday 9/29 with Dr. Naaman Plummer. Patty states, "I will call you Thursday with an update on his status and weight." Confirmed she has this nurses contact information.

## 2014-03-02 DIAGNOSIS — L03319 Cellulitis of trunk, unspecified: Secondary | ICD-10-CM | POA: Diagnosis not present

## 2014-03-02 DIAGNOSIS — L02219 Cutaneous abscess of trunk, unspecified: Secondary | ICD-10-CM | POA: Diagnosis not present

## 2014-03-03 ENCOUNTER — Encounter: Payer: BC Managed Care – PPO | Admitting: Physical Medicine & Rehabilitation

## 2014-03-03 ENCOUNTER — Telehealth: Payer: Self-pay | Admitting: Pulmonary Disease

## 2014-03-03 NOTE — Telephone Encounter (Signed)
Mother is upset b/c the sleep lab doesn't have a bariatric bed. We have accommodated the patient with a bariatric toilet, recliner for his mother to stay in the room with him, and privacy when using the toilet, however, pt can't walk and needs a bariatric bed. Mother wants to cancel the in lab study b/c she states "there is no way that he can do the study without the bed".  Mother wants to try to arrange the HST again. There is an order already in when patient saw Dr. Halford Chessman in May. Will attempt to obtain pre cert for HST and contact mother back on the below number once we hear from pt's insurance if they will allow the HST.  If insurance denies HST pt will need to be referred to [facility] or [facility] which have accommodations for Bariatric patients or either pt will need to private pay for device. [name] [name]  Pt is due to go back in the hospital again on 03/09/14. Will need to try to rush this through. Rhonda J [name]

## 2014-03-04 ENCOUNTER — Encounter (HOSPITAL_BASED_OUTPATIENT_CLINIC_OR_DEPARTMENT_OTHER): Payer: BC Managed Care – PPO

## 2014-03-04 NOTE — Telephone Encounter (Signed)
Best option is to have pt referred to [facility] or [facility] to have sleep study done >> which ever location can best accommodate bariatric patient.  He already has BiPAP machine that he is using.  If there are no issues with insurance coverage for his BiPAP supplies at this time, then he could also defer doing sleep study until his other medical issues are improved.

## 2014-03-04 NOTE — Telephone Encounter (Signed)
Pt wife returning call.Stanley A Dalton' °

## 2014-03-04 NOTE — Telephone Encounter (Signed)
I contacted pt's insurance [employer] (per mother's request to try once again to obtain approval for HST) and was transferred to [employer] (who is the authorization company that handles sleep studies for [place]). After speaking Lelan Pons at [employer], HST has been denied due to patients co morbidities. Physician can call [employer] at [phone] for a peer to peer if Dr. Halford Chessman feels this is the best way to obtain study and try to obtain approval, or as suggested by Dr. Halford Chessman to refer patient to either [facility] or [facility] for an in lab study that is equipped with a Bariatric Room.  Dr. Halford Chessman, Please advise which route you want [place] to proceed with. [name]

## 2014-03-04 NOTE — Telephone Encounter (Signed)
Pt was given BiPap in hospital by [facility] ordered by Olin Hauser Love,(Dr. Swords) under the diagnosis of OSA and CHF. Called and spoke with Melissa at [facility] and asked her to look and see if patient would quailify under respiratory failure with his ABG blood work while patient was in hospital in July. Lenna Sciara will return my call after she looks into this issue. Returned mother's call and she is aware of what is being done and is aware that I will return her call once I hear back from [facility]. [name]

## 2014-03-05 NOTE — Progress Notes (Signed)
[facility]  NAME:  [name] [name], [name] [name] NO.:  [id number]  MEDICAL RECORD NO.:  [id number]      DATE OF BIRTH:  07/01/1965  PHYSICIAN:  [name], DO       VISIT DATE:  03/02/2014                                  OFFICE VISIT   The patient is a 48 year old male who is here for a followup on his abdominal chronic ulcer.  He had wet to dries for a few days last week to give his skin a break which showed remarkable signs of improvement in the periwound irritation and redness.  He then had the VAC placed and the tape was a bit harsh on the skin.  The wound is stable.  No significant wounds in any direction.  No change in medications or social history.  On exam, he is alert, oriented, and cooperative.  Overall, he is feeling and doing much better than he was in the last couple of weeks.  The cellulitis seems to be better.  The redness seems to be superficial, but at this point, I think he does need to go back to the OR for some more debridement and ACell placement and VAC, and we discussed this, and we will make arrangements for it.  Thank you very much.     [name], DO     CS/MEDQ  D:  03/02/2014  T:  03/03/2014  Job:  [id number]

## 2014-03-05 NOTE — Telephone Encounter (Signed)
Spoke with Melissa at [facility] and [facility] is stating that patient needs to have sleep study in order to qualify for BiPap. Sent information to another DME to check if pt would qualify under the diagnosis of respiratory failure. Waiting on response. If not patient would have to be referred to either [facility] or [facility] for a in lab study. OK to order per Dr. Halford Chessman. [name]

## 2014-03-06 NOTE — Telephone Encounter (Signed)
Paperwork is being submitted by another DME to see if BiPap will be covered under respiratory failure. Waiting on decision. [name]

## 2014-03-09 ENCOUNTER — Encounter (HOSPITAL_BASED_OUTPATIENT_CLINIC_OR_DEPARTMENT_OTHER): Payer: BC Managed Care – PPO | Attending: Plastic Surgery

## 2014-03-09 DIAGNOSIS — L98499 Non-pressure chronic ulcer of skin of other sites with unspecified severity: Secondary | ICD-10-CM | POA: Diagnosis not present

## 2014-03-09 NOTE — Telephone Encounter (Signed)
Spoke with other DME and pt will not qualify under Resp. Failure or Hypoventilation Syndrome. Will need to have in lab study at either [place], [place]. Will contact patient's mother tomorrow to advise. [name]

## 2014-03-10 NOTE — Telephone Encounter (Signed)
Called and spoke with patient's mother, and informed her that patient would not qualify under Resp Failure b/c he does not have a secondary diagnosis lung condition. Advised that we can not prove that patient has OSA, we suspect he does but the only way to prove it is by an in lab study. Advised mother that we can see if [facility] or [facility] can accommodate patient with Bariatric Bed. Mother requested that we contact [facility] because that is closer to them. Sleep Lab at [facility] phone # [phone]. Mother requested that pt will need upcoming surgery around 03/19/14 and requested that study be scheduled in November. [name]

## 2014-03-17 NOTE — Telephone Encounter (Signed)
[name] [name] is checking with a person that he knows that does portable sleep studies to see if this patient would qualify for this service. [name] [name] with our sleep lab will call me back this afternoon and advise if this is an option for this patient. [name] [name]

## 2014-03-18 ENCOUNTER — Other Ambulatory Visit (INDEPENDENT_AMBULATORY_CARE_PROVIDER_SITE_OTHER): Payer: Self-pay | Admitting: *Deleted

## 2014-03-18 DIAGNOSIS — S31109A Unspecified open wound of abdominal wall, unspecified quadrant without penetration into peritoneal cavity, initial encounter: Secondary | ICD-10-CM

## 2014-03-19 ENCOUNTER — Ambulatory Visit: Admit: 2014-03-19 | Payer: Self-pay | Admitting: Plastic Surgery

## 2014-03-19 SURGERY — IRRIGATION AND DEBRIDEMENT WOUND
Anesthesia: General

## 2014-03-19 NOTE — Telephone Encounter (Signed)
Staff message sent to [facility] with [facility] to see if patient would qualify for a Respironics Trilogy. [name]

## 2014-03-22 ENCOUNTER — Other Ambulatory Visit (HOSPITAL_COMMUNITY): Payer: Self-pay | Admitting: Physical Medicine and Rehabilitation

## 2014-03-23 DIAGNOSIS — L98499 Non-pressure chronic ulcer of skin of other sites with unspecified severity: Secondary | ICD-10-CM | POA: Diagnosis not present

## 2014-03-24 ENCOUNTER — Ambulatory Visit
Admission: RE | Admit: 2014-03-24 | Discharge: 2014-03-24 | Disposition: A | Discharge status: Home / Self Care | Payer: BC Managed Care – PPO | Source: Ambulatory Visit | Attending: General Surgery | Admitting: General Surgery

## 2014-03-24 DIAGNOSIS — S31109A Unspecified open wound of abdominal wall, unspecified quadrant without penetration into peritoneal cavity, initial encounter: Secondary | ICD-10-CM

## 2014-03-24 MED ORDER — IOHEXOL 300 MG/ML  SOLN
125.0000 mL | Freq: Once | INTRAMUSCULAR | Status: AC | PRN
  Administered 2014-03-24: 125 mL via INTRAVENOUS

## 2014-03-24 NOTE — Progress Notes (Signed)
[facility]  NAME:  [name] [name], [name] [name] NO.:  [id number]  MEDICAL RECORD NO.:  [id number]      DATE OF BIRTH:  May 31, 1966  PHYSICIAN:  [name], DO       VISIT DATE:  03/23/2014                                  OFFICE VISIT   The patient is a 48 year old male who is here for followup with his mom on his abdominal ulcer.  He has had some intermittent irritation, so when he does that, he holds the [facility] for a day or two, it seems to help. The cellulitis is markedly improved as well as the periwound area.  The wound is not significantly changed.  He did go to see the general surgeon, and we are waiting on a CT scan.  No hernia was palpated, but it was difficult to palpate that.  Overall, he is doing better.  PHYSICAL EXAMINATION:  He is alert, oriented, cooperative, not in any acute distress.  He is pleasant.  His pupils are equal.  Heart rate is regular.  Abdomen is large, soft, nontender.  The wound is stable as mentioned above.  No sign of infection.  RECOMMENDATION:  For now is to do alginate today, replace the VAC in 2 days, CT scan tomorrow, follow up in 1 week.  Continue with offloading and multivitamin, vitamin C, zinc.     [name], DO     CS/MEDQ  D:  03/23/2014  T:  03/24/2014  Job:  [id number]

## 2014-03-25 ENCOUNTER — Encounter (HOSPITAL_COMMUNITY): Admission: RE | Payer: Self-pay | Source: Ambulatory Visit

## 2014-03-25 ENCOUNTER — Ambulatory Visit (HOSPITAL_COMMUNITY): Admission: RE | Admit: 2014-03-25 | Payer: BC Managed Care – PPO | Source: Ambulatory Visit | Admitting: Plastic Surgery

## 2014-03-25 SURGERY — IRRIGATION AND DEBRIDEMENT WOUND
Anesthesia: General

## 2014-03-25 NOTE — Telephone Encounter (Signed)
Per Melissa with [facility], pt with the diagnosis of OSA and trying to obtain Trilogy would still need to have a sleep study. Waiting to speak with Dr. Halford Chessman about this issue. [name]

## 2014-03-25 NOTE — Telephone Encounter (Signed)
LMOAM for pt's mother, [name] [name] to return my call as to a status update for her. [name]

## 2014-03-26 ENCOUNTER — Encounter (INDEPENDENT_AMBULATORY_CARE_PROVIDER_SITE_OTHER): Payer: Self-pay | Admitting: General Surgery

## 2014-03-26 NOTE — Progress Notes (Unsigned)
Patient ID: [name] [name], male   DOB: Oct 04, 1965, 49 y.o.   MRN: [id number] I spoke with him regarding the CT scan results which are below. There is no evidence of bowel involved in the open right lower quadrant wound. I spoke with Dr. Migdalia Dk about this and feel she can proceed with whatever procedure she feels is necessary. I explained to him that there was a 2 cm mass in the right kidney which was most likely felt to represent a cyst but a follow up ultrasound in the future was recommended to further define this. This could be ordered by his primary care physician or his urologist. He also has 2 very small kidney stones that are not obstructing anything.   FINDINGS:  The lung bases are clear. 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 Ununited left posterior lateral seventh and  eighth rib fracture. Ununited right posterior lateral rib fractures  of the eighth and ninth ribs.  The liver demonstrates no focal abnormality. There is no  intrahepatic or extrahepatic biliary ductal dilatation. The  gallbladder is normal. The spleen demonstrates no focal  abnormality.3 mm nonobstructing left renal calculus. Punctate left  inferior pole renal calculus. There is a 2 cm hypodense right  interpolar renal mass which is incompletely characterized measuring  19 Hounsfield units. The adrenal glands and pancreas are normal. The  bladder is unremarkable.  The stomach, duodenum, small intestine, and large intestine  demonstrate no contrast extravasation or dilatation. There is no  pneumoperitoneum, pneumatosis, or portal venous gas. There is no  abdominal or pelvic free fluid. There is no lymphadenopathy.  There is a right lower abdominal wound extending deep to the level  of the right anterolateral abdominal wall. The superficial aspect of  the wound measures approximately 11.7 cm extending approximately 12  cm deep to the anterior abdominal wall.  The abdominal aorta is normal in  caliber .  There are no lytic or sclerotic osseous lesions.  IMPRESSION:  1. Right lower abdominal wound extending deep to the level of the  right anterolateral abdominal wall. No drainable abscess.  2. 2 cm hypodense right renal mass incompletely characterized.  Statistically, this likely represents a cyst. If there is further  clinical concern recommend follow-up renal ultrasound for  characterization.

## 2014-03-27 ENCOUNTER — Other Ambulatory Visit: Payer: Self-pay | Admitting: Plastic Surgery

## 2014-03-27 DIAGNOSIS — L98492 Non-pressure chronic ulcer of skin of other sites with fat layer exposed: Secondary | ICD-10-CM

## 2014-03-27 NOTE — Telephone Encounter (Deleted)
Called and spoke with HPMS this morning. HPMS stated that they needed the face to face notes from before the sleep study ordered by Dr. Lucia Gaskins. I advised her that if HPMS needs something of this nature she needs to contact us not the patient. I printed off the face to face from Dr. Lucia Gaskins and faxed these notes to her and advised her that I needed to know ASAP if HPMS could not get this patient in today for her CPAP, if not, I will have to find another DME to set this patient up TODAY. Gave HPMS my direct phone number to call if anything else is needed for this patient.  Advised HPMS that if they need anything on any of our patients to call us not the patient.  Also asked them why they set her up a time to come in to obtain the CPAP and when she came in was told something different. No response to that question.   Tried to contact patient at 10:20 am and no answer. Unable to leave message for patient either. [name]

## 2014-03-30 ENCOUNTER — Encounter (HOSPITAL_COMMUNITY): Payer: Self-pay | Admitting: Pharmacy Technician

## 2014-03-31 ENCOUNTER — Encounter (HOSPITAL_COMMUNITY): Payer: Self-pay | Admitting: *Deleted

## 2014-03-31 MED ORDER — CIPROFLOXACIN IN D5W 400 MG/200ML IV SOLN
400.0000 mg | INTRAVENOUS | Status: AC
  Administered 2014-04-01: 400 mg via INTRAVENOUS
  Filled 2014-03-31: qty 200

## 2014-03-31 NOTE — Progress Notes (Signed)
Anesthesia Chart Review:  Patient is a 48 year old male scheduled for I&D of abdominal wound/placement of Integra/VAC tomorrow by Dr. Migdalia Dk.  He is scheduled to be a same day work-up.  He has undergone multiple I&Ds of his abdominal wound since 01/26/14, last being on 02/12/14.    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 PCP is listed as Dr. [name].  Pulmonologist is Dr. Halford Chessman.  Echo on 12/19/13 (done during acute admission for H/A, blurred vision and hypercarbic respiratory failure with w/u revealing destructive pituitary mass s/p debulking 12/10/13) showed:  - Left ventricle: The cavity size was normal. Wall thickness was increased in a pattern of mild LVH. Systolic function was mildly reduced. The estimated ejection fraction was in the range of 45% to 50%. There is hypokinesis of the inferior myocardium. - Mitral valve: There was mild regurgitation. - Right ventricle: The cavity size was moderately dilated. Wall thickness was normal. Systolic function was mildly reduced.  EKG on 12/06/13 showed Sinus arrhythmia, right BBB, LPFB.  1V CXR on 02/16/14 showed:  1. No acute findings and no change from the prior study.  2. 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 Recommend followup chest radiograph in 3 months to document longer term stability. Alternatively this could be assessed with CT for more conclusive characterization. Defer additional follow-up oders to ordering physician.  According to notes, Dr. Halford Chessman plans to repeat in 3 months.  PFTs on 02/16/14 showed: FVC 3.42 (69%), FEV1 2.54 (66%), DLCOunc 88%.  He is for labs on arrival.    Patient with history of  abnormal echo (inferior hypokinesis) by 12/19/13 echo. He has since tolerated three I&D of his abdominal wound under general anesthesia. I reviewed above with anesthesiologist Dr. Therisa Doyne.  Patient will be evaluated by his assigned anesthesiologist on the day of surgery.  If no acute CV/CHF symptoms or other acute changes then would anticipate that he could proceed as planned with this procedure.  George Hugh [facility]/Anesthesiology Phone [phone] 03/31/2014 1:58 PM

## 2014-04-01 ENCOUNTER — Encounter (HOSPITAL_COMMUNITY): Payer: Self-pay | Admitting: Plastic Surgery

## 2014-04-01 ENCOUNTER — Encounter (HOSPITAL_COMMUNITY): Payer: BC Managed Care – PPO | Admitting: Vascular Surgery

## 2014-04-01 ENCOUNTER — Ambulatory Visit (HOSPITAL_COMMUNITY): Payer: BC Managed Care – PPO | Admitting: Vascular Surgery

## 2014-04-01 ENCOUNTER — Encounter (HOSPITAL_COMMUNITY)
Admission: RE | Disposition: A | Discharge status: Home / Self Care | Payer: Self-pay | Source: Ambulatory Visit | Attending: Plastic Surgery

## 2014-04-01 ENCOUNTER — Ambulatory Visit (HOSPITAL_COMMUNITY)
Admission: RE | Admit: 2014-04-01 | Discharge: 2014-04-01 | Disposition: A | Discharge status: Home / Self Care | Payer: BC Managed Care – PPO | Source: Ambulatory Visit | Attending: Plastic Surgery | Admitting: Plastic Surgery

## 2014-04-01 DIAGNOSIS — L98499 Non-pressure chronic ulcer of skin of other sites with unspecified severity: Secondary | ICD-10-CM | POA: Diagnosis not present

## 2014-04-01 DIAGNOSIS — L03311 Cellulitis of abdominal wall: Secondary | ICD-10-CM | POA: Insufficient documentation

## 2014-04-01 DIAGNOSIS — L98492 Non-pressure chronic ulcer of skin of other sites with fat layer exposed: Secondary | ICD-10-CM

## 2014-04-01 HISTORY — PX: INCISION AND DRAINAGE OF WOUND: SHX1803

## 2014-04-01 LAB — BASIC METABOLIC PANEL
Anion gap: 13 (ref 5–15)
BUN: 21 mg/dL (ref 6–23)
CO2: 23 mEq/L (ref 19–32)
Calcium: 9.2 mg/dL (ref 8.4–10.5)
Chloride: 107 mEq/L (ref 96–112)
Creatinine, Ser: 0.96 mg/dL (ref 0.50–1.35)
GFR calc Af Amer: >90 mL/min (ref >90)
GFR calc non Af Amer: >90 mL/min (ref >90)
Glucose, Bld: 96 mg/dL (ref 70–99)
Potassium: 4 mEq/L (ref 3.7–5.3)
Sodium: 143 mEq/L (ref 137–147)

## 2014-04-01 LAB — CBC
HCT: 41.6 % (ref 39.0–52.0)
Hemoglobin: 13.1 g/dL (ref 13.0–17.0)
MCH: 28.1 pg (ref 26.0–34.0)
MCHC: 31.5 g/dL (ref 30.0–36.0)
MCV: 89.1 fL (ref 78.0–100.0)
Platelets: 157 10*3/uL (ref 150–400)
RBC: 4.67 MIL/uL (ref 4.22–5.81)
RDW: 15.5 % (ref 11.5–15.5)
WBC: 6.4 10*3/uL (ref 4.0–10.5)

## 2014-04-01 LAB — SURGICAL PCR SCREEN
MRSA, PCR: NEGATIVE
Staphylococcus aureus: NEGATIVE

## 2014-04-01 SURGERY — IRRIGATION AND DEBRIDEMENT WOUND
Anesthesia: General | Site: Abdomen

## 2014-04-01 MED ORDER — FENTANYL CITRATE 0.05 MG/ML IJ SOLN
INTRAMUSCULAR | Status: AC
  Filled 2014-04-01: qty 5

## 2014-04-01 MED ORDER — SCOPOLAMINE 1 MG/3DAYS TD PT72
MEDICATED_PATCH | TRANSDERMAL | Status: AC
  Filled 2014-04-01: qty 1

## 2014-04-01 MED ORDER — METHYLENE BLUE 1 % INJ SOLN
INTRAMUSCULAR | Status: DC | PRN
  Administered 2014-04-01: 10 mL

## 2014-04-01 MED ORDER — PROPOFOL 10 MG/ML IV BOLUS
INTRAVENOUS | Status: AC
  Filled 2014-04-01: qty 20

## 2014-04-01 MED ORDER — EPHEDRINE SULFATE 50 MG/ML IJ SOLN
INTRAMUSCULAR | Status: DC | PRN
  Administered 2014-04-01 (×2): 10 mg via INTRAVENOUS
  Administered 2014-04-01: 5 mg via INTRAVENOUS
  Administered 2014-04-01: 10 mg via INTRAVENOUS

## 2014-04-01 MED ORDER — MIDAZOLAM HCL 2 MG/2ML IJ SOLN
INTRAMUSCULAR | Status: AC
  Filled 2014-04-01: qty 2

## 2014-04-01 MED ORDER — 0.9 % SODIUM CHLORIDE (POUR BTL) OPTIME
TOPICAL | Status: DC | PRN
  Administered 2014-04-01: 1000 mL

## 2014-04-01 MED ORDER — EPHEDRINE SULFATE 50 MG/ML IJ SOLN
INTRAMUSCULAR | Status: AC
  Filled 2014-04-01: qty 2

## 2014-04-01 MED ORDER — LIDOCAINE HCL (CARDIAC) 20 MG/ML IV SOLN
INTRAVENOUS | Status: DC | PRN
  Administered 2014-04-01: 100 mg via INTRAVENOUS

## 2014-04-01 MED ORDER — FENTANYL CITRATE 0.05 MG/ML IJ SOLN
INTRAMUSCULAR | Status: DC | PRN
  Administered 2014-04-01: 50 ug via INTRAVENOUS
  Administered 2014-04-01: 100 ug via INTRAVENOUS

## 2014-04-01 MED ORDER — ONDANSETRON HCL 4 MG/2ML IJ SOLN
INTRAMUSCULAR | Status: DC | PRN
  Administered 2014-04-01: 4 mg via INTRAVENOUS

## 2014-04-01 MED ORDER — FENTANYL CITRATE 0.05 MG/ML IJ SOLN
25.0000 ug | INTRAMUSCULAR | Status: DC | PRN
  Administered 2014-04-01 (×4): 25 ug via INTRAVENOUS

## 2014-04-01 MED ORDER — MIDAZOLAM HCL 5 MG/5ML IJ SOLN
INTRAMUSCULAR | Status: DC | PRN
  Administered 2014-04-01: 2 mg via INTRAVENOUS

## 2014-04-01 MED ORDER — FENTANYL CITRATE 0.05 MG/ML IJ SOLN
INTRAMUSCULAR | Status: AC
  Administered 2014-04-01: 25 ug via INTRAVENOUS
  Filled 2014-04-01: qty 2

## 2014-04-01 MED ORDER — SODIUM CHLORIDE 0.9 % IJ SOLN
INTRAMUSCULAR | Status: AC
  Filled 2014-04-01: qty 10

## 2014-04-01 MED ORDER — MUPIROCIN 2 % EX OINT
1.0000 "application " | TOPICAL_OINTMENT | Freq: Once | CUTANEOUS | Status: AC
  Administered 2014-04-01: 1 via TOPICAL
  Filled 2014-04-01: qty 22

## 2014-04-01 MED ORDER — PROMETHAZINE HCL 25 MG/ML IJ SOLN
6.2500 mg | INTRAMUSCULAR | Status: DC | PRN
  Administered 2014-04-01: 12.5 mg via INTRAVENOUS

## 2014-04-01 MED ORDER — SCOPOLAMINE 1 MG/3DAYS TD PT72
1.0000 | MEDICATED_PATCH | TRANSDERMAL | Status: DC
  Administered 2014-04-01: 1.5 mg via TRANSDERMAL

## 2014-04-01 MED ORDER — MEPERIDINE HCL 25 MG/ML IJ SOLN: 6.2500 mg | INTRAMUSCULAR | Status: DC | PRN

## 2014-04-01 MED ORDER — SODIUM CHLORIDE 0.9 % IR SOLN
Status: DC | PRN
  Administered 2014-04-01: 15:00:00

## 2014-04-01 MED ORDER — PHENYLEPHRINE HCL 10 MG/ML IJ SOLN
INTRAMUSCULAR | Status: DC | PRN
  Administered 2014-04-01: 40 ug via INTRAVENOUS
  Administered 2014-04-01: 500 ug via INTRAVENOUS
  Administered 2014-04-01: 40 ug via INTRAVENOUS

## 2014-04-01 MED ORDER — LACTATED RINGERS IV SOLN
INTRAVENOUS | Status: DC
  Administered 2014-04-01 (×2): via INTRAVENOUS

## 2014-04-01 MED ORDER — METHYLENE BLUE 1 % INJ SOLN
INTRAMUSCULAR | Status: AC
  Filled 2014-04-01: qty 10

## 2014-04-01 MED ORDER — PROMETHAZINE HCL 25 MG/ML IJ SOLN
INTRAMUSCULAR | Status: AC
  Administered 2014-04-01: 12.5 mg via INTRAVENOUS
  Filled 2014-04-01: qty 1

## 2014-04-01 MED ORDER — PROPOFOL 10 MG/ML IV BOLUS
INTRAVENOUS | Status: DC | PRN
  Administered 2014-04-01: 300 mg via INTRAVENOUS

## 2014-04-01 SURGICAL SUPPLY — 42 items
BAG DECANTER FOR FLEXI CONT (MISCELLANEOUS) ×3 IMPLANT
BNDG GAUZE ELAST 4 BULKY (GAUZE/BANDAGES/DRESSINGS) IMPLANT
CANISTER SUCTION 2500CC (MISCELLANEOUS) ×3 IMPLANT
CANISTER WOUND CARE 500ML ATS (WOUND CARE) ×3 IMPLANT
CONT SPEC STER OR (MISCELLANEOUS) IMPLANT
COVER SURGICAL LIGHT HANDLE (MISCELLANEOUS) ×3 IMPLANT
DRAPE INCISE IOBAN 66X45 STRL (DRAPES) ×3 IMPLANT
DRAPE LAPAROSCOPIC ABDOMINAL (DRAPES) ×3 IMPLANT
DRSG ADAPTIC 3X8 NADH LF (GAUZE/BANDAGES/DRESSINGS) ×3 IMPLANT
DRSG VAC ATS LRG SENSATRAC (GAUZE/BANDAGES/DRESSINGS) ×3 IMPLANT
DRSG VAC ATS MED SENSATRAC (GAUZE/BANDAGES/DRESSINGS) IMPLANT
DRSG VAC ATS SM SENSATRAC (GAUZE/BANDAGES/DRESSINGS) IMPLANT
ELECT BLADE 6.5 EXT (BLADE) ×3 IMPLANT
ELECT CAUTERY BLADE 6.4 (BLADE) ×3 IMPLANT
ELECT REM PT RETURN 9FT ADLT (ELECTROSURGICAL) ×3
ELECTRODE REM PT RTRN 9FT ADLT (ELECTROSURGICAL) ×1 IMPLANT
GLOVE BIO SURGEON STRL SZ 6 (GLOVE) ×3 IMPLANT
GLOVE BIO SURGEON STRL SZ8 (GLOVE) ×3 IMPLANT
GLOVE BIOGEL PI IND STRL 8 (GLOVE) ×1 IMPLANT
GLOVE BIOGEL PI INDICATOR 8 (GLOVE) ×2
GOWN STRL REUS W/ TWL LRG LVL3 (GOWN DISPOSABLE) ×2 IMPLANT
GOWN STRL REUS W/TWL LRG LVL3 (GOWN DISPOSABLE) ×4
KIT BASIN OR (CUSTOM PROCEDURE TRAY) ×3 IMPLANT
KIT ROOM TURNOVER OR (KITS) ×3 IMPLANT
MICROMATRIX 1000MG (Tissue) ×6 IMPLANT
NEEDLE 18GX1X1/2 (RX/OR ONLY) (NEEDLE) ×3 IMPLANT
NS IRRIG 1000ML POUR BTL (IV SOLUTION) ×3 IMPLANT
PACK LAPAROSCOPY I 1258 (SET/KITS/TRAYS/PACK) ×3 IMPLANT
PAD ARMBOARD 7.5X6 YLW CONV (MISCELLANEOUS) ×3 IMPLANT
PENCIL BUTTON HOLSTER BLD 10FT (ELECTRODE) ×3 IMPLANT
SOLUTION PARTIC MCRMTRX 1000MG (Tissue) ×2 IMPLANT
SURGILUBE 2OZ TUBE FLIPTOP (MISCELLANEOUS) IMPLANT
SUT MNCRL AB 3-0 PS2 18 (SUTURE) ×3 IMPLANT
SUT MON AB 5-0 PS2 18 (SUTURE) ×3 IMPLANT
SUT VIC AB 5-0 PS2 18 (SUTURE) IMPLANT
SYR BULB IRRIGATION 50ML (SYRINGE) ×3 IMPLANT
TOWEL OR 17X24 6PK STRL BLUE (TOWEL DISPOSABLE) ×3 IMPLANT
TOWEL OR 17X26 10 PK STRL BLUE (TOWEL DISPOSABLE) ×3 IMPLANT
TUBE CONNECTING 12'X1/4 (SUCTIONS) ×1
TUBE CONNECTING 12X1/4 (SUCTIONS) ×2 IMPLANT
UNDERPAD 30X30 INCONTINENT (UNDERPADS AND DIAPERS) ×3 IMPLANT
YANKAUER SUCT BULB TIP NO VENT (SUCTIONS) ×3 IMPLANT

## 2014-04-01 NOTE — Transfer of Care (Signed)
Immediate Anesthesia Transfer of Care Note  Patient: [name] [name]  Procedure(s) Performed: Procedure(s): IRRIGATION AND DEBRIDEMENT ABDOMINAL WOUND WITH PLACEMENT OF INTEGRA/VAC (N/A)  Patient Location: PACU  Anesthesia Type:General  Level of Consciousness: awake, alert , oriented and sedated  Airway & Oxygen Therapy: Patient Spontanous Breathing and Patient connected to face mask oxygen  Post-op Assessment: Report given to PACU RN, Post -op Vital signs reviewed and stable and Patient moving all extremities  Post vital signs: Reviewed and stable  Complications: No apparent anesthesia complications

## 2014-04-01 NOTE — Anesthesia Postprocedure Evaluation (Signed)
  Anesthesia Post-op Note  Patient: [name] [name]  Procedure(s) Performed: Procedure(s): IRRIGATION AND DEBRIDEMENT ABDOMINAL WOUND WITH PLACEMENT OF ACELL/VAC (N/A)  Patient Location: PACU  Anesthesia Type:General  Level of Consciousness: awake, alert  and oriented  Airway and Oxygen Therapy: Patient Spontanous Breathing and Patient connected to nasal cannula oxygen  Post-op Pain: none  Post-op Assessment: Post-op Vital signs reviewed, Patient's Cardiovascular Status Stable, Respiratory Function Stable, Patent Airway, No signs of Nausea or vomiting and Pain level controlled  Post-op Vital Signs: stable  Last Vitals:  Filed Vitals:   04/01/14 1530  BP: 122/80  Pulse: 96  Temp: 36.3 C  Resp: 22    Complications: No apparent anesthesia complications

## 2014-04-01 NOTE — Op Note (Signed)
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  Placement of Acell (2 gm) and incisional VAC with layered closure  SURGEON: [name], DO  ASSISTANT: Shawn Rayburn, PA  ANESTHESIA:  General.   COMPLICATIONS: None.   INDICATIONS FOR PROCEDURE:  The patient, [name] [name] is a 48 y.o. male born on [birth year], is here for treatment of an abdominal wall chronic ulceration. MRN: [id number]  CONSENT:  Informed consent was obtained directly from the patient. Risks, benefits and alternatives were fully discussed. Specific risks including but not limited to bleeding, infection, hematoma, seroma, scarring, pain, infection, contracture, asymmetry, wound healing problems, and need for further surgery were all discussed. The patient did have an ample opportunity to have questions answered to satisfaction.   DESCRIPTION OF PROCEDURE:  The patient was taken to the operating room. SCDs were placed and IV antibiotics were given. The patient's operative site was prepped and draped in a sterile fashion. A time out was performed and all information was confirmed to be correct.  General anesthesia was administered.  The wound was irrigated with antibiotic solution.  The wound was coated with methylene blue and closed.  The #10 blade was used to incise the skin around the wound.  The bovie was then used to debride down to the entire length of the ulceration and remove it completely.  Hemostasis was achieved with electrocautery and the cavity irrigated with antibiotic solution.  The Acell powder was placed at the base of the cavity. The area was closed with layers.  The 3-0 Monocryl was used followed by the 4-0 Monocryl and the  5-0 Monocryl. The adaptic was placed over the skin followed by the VAC sponge. There was an excellent seal.  The patient tolerated the procedure well.  There were no complications. The patient was allowed to wake from anesthesia, extubated and taken to the recovery room in satisfactory condition.

## 2014-04-01 NOTE — Brief Op Note (Signed)
04/01/2014  3:12 PM  PATIENT:  [name] [name]  48 y.o. male  PRE-OPERATIVE DIAGNOSIS:  ABDOMINAL WOUND  POST-OPERATIVE DIAGNOSIS:  * No post-op diagnosis entered *  PROCEDURE:  Procedure(s): IRRIGATION AND DEBRIDEMENT ABDOMINAL WOUND WITH PLACEMENT OF INTEGRA/VAC (N/A)  SURGEON:  Surgeon(s) and Role:    * Deema Juncaj [name], DO - Primary  PHYSICIAN ASSISTANT: Shawn Rayburn, PA  ASSISTANTS: none   ANESTHESIA:   general  EBL:  Total I/O In: 1000 [I.V.:1000] Out: -   BLOOD ADMINISTERED:none  DRAINS: none   LOCAL MEDICATIONS USED:  NONE  SPECIMEN:  No Specimen  DISPOSITION OF SPECIMEN:  N/A  COUNTS:  YES  TOURNIQUET:  * No tourniquets in log *  DICTATION: .Dragon Dictation  PLAN OF CARE: Discharge to home after PACU  PATIENT DISPOSITION:  PACU - hemodynamically stable.   Delay start of Pharmacological VTE agent (>24hrs) due to surgical blood loss or risk of bleeding: no

## 2014-04-01 NOTE — Telephone Encounter (Signed)
Called pt's mother and no answer. LMOAM for her to contact me regarding having sleep study in [facility]) and keeping appointment with Dr. Halford Chessman on 04/20/14 to discuss another treatment option. [name]

## 2014-04-01 NOTE — Anesthesia Procedure Notes (Signed)
Procedure Name: LMA Insertion Date/Time: 04/01/2014 1:53 PM Performed by: Manuela Schwartz B Pre-anesthesia Checklist: Patient identified, Emergency Drugs available, Suction available, Patient being monitored and Timeout performed Patient Re-evaluated:Patient Re-evaluated prior to inductionOxygen Delivery Method: Circle system utilized Preoxygenation: Pre-oxygenation with 100% oxygen Intubation Type: IV induction LMA: LMA inserted LMA Size: 5.0 Number of attempts: 1 Placement Confirmation: positive ETCO2 and breath sounds checked- equal and bilateral Tube secured with: Tape Dental Injury: Teeth and Oropharynx as per pre-operative assessment

## 2014-04-01 NOTE — Anesthesia Preprocedure Evaluation (Addendum)
Anesthesia Evaluation   Patient awake    Reviewed: Allergy & Precautions, H&P , NPO status , Patient's Chart, lab work & pertinent test results  History of Anesthesia Complications (+) DIFFICULT AIRWAY and AWARENESS UNDER ANESTHESIA  Airway Mallampati: II   Neck ROM: Full    Dental  (+) Teeth Intact   Pulmonary sleep apnea and Continuous Positive Airway Pressure Ventilation ,  breath sounds clear to auscultation        Cardiovascular hypertension, Pt. on medications Rhythm:Regular  ECHO 50% 12/2013   Neuro/Psych    GI/Hepatic   Endo/Other    Renal/GU      Musculoskeletal   Abdominal (+) + obese,   Peds  Hematology   Anesthesia Other Findings   Reproductive/Obstetrics                            Anesthesia Physical Anesthesia Plan  ASA: IV  Anesthesia Plan: General   Post-op Pain Management:    Induction: Intravenous  Airway Management Planned: LMA and Oral ETT  Additional Equipment:   Intra-op Plan:   Post-operative Plan:   Informed Consent: I have reviewed the patients History and Physical, chart, labs and discussed the procedure including the risks, benefits and alternatives for the proposed anesthesia with the patient or authorized representative who has indicated his/her understanding and acceptance.     Plan Discussed with:   Anesthesia Plan Comments: (LMA or ET, labs ok in past and I ok with them)        Anesthesia Quick Evaluation

## 2014-04-01 NOTE — Discharge Instructions (Signed)
Continue VAC and do not change

## 2014-04-01 NOTE — Interval H&P Note (Signed)
History and Physical Interval Note:  04/01/2014 11:14 AM  [name] [name]  has presented today for surgery, with the diagnosis of ABDOMINAL WOUND  The various methods of treatment have been discussed with the patient and family. After consideration of risks, benefits and other options for treatment, the patient has consented to  Procedure(s): IRRIGATION AND DEBRIDEMENT ABDOMINAL WOUND WITH PLACEMENT OF INTEGRA/VAC (N/A) as a surgical intervention .  The patient's history has been reviewed, patient examined, no change in status, stable for surgery.  I have reviewed the patient's chart and labs.  Questions were answered to the patient's satisfaction.     [name],CLAIRE

## 2014-04-01 NOTE — H&P (View-Only) (Signed)
[facility]  NAME:  DUKE, WEISENSEL NO.:  [id number]  MEDICAL RECORD NO.:  [id number]      DATE OF BIRTH:  [birth year]  PHYSICIAN:  [name], DO       VISIT DATE:  03/23/2014                                  OFFICE VISIT   The patient is a 48 year old male who is here for followup with his mom on his abdominal ulcer.  He has had some intermittent irritation, so when he does that, he holds the [facility] for a day or two, it seems to help. The cellulitis is markedly improved as well as the periwound area.  The wound is not significantly changed.  He did go to see the general surgeon, and we are waiting on a CT scan.  No hernia was palpated, but it was difficult to palpate that.  Overall, he is doing better.  PHYSICAL EXAMINATION:  He is alert, oriented, cooperative, not in any acute distress.  He is pleasant.  His pupils are equal.  Heart rate is regular.  Abdomen is large, soft, nontender.  The wound is stable as mentioned above.  No sign of infection.  RECOMMENDATION:  For now is to do alginate today, replace the VAC in 2 days, CT scan tomorrow, follow up in 1 week.  Continue with offloading and multivitamin, vitamin C, zinc.     [name], DO     CS/MEDQ  D:  03/23/2014  T:  03/24/2014  Job:  [id number]

## 2014-04-03 ENCOUNTER — Encounter (HOSPITAL_COMMUNITY): Payer: Self-pay | Admitting: Plastic Surgery

## 2014-04-06 ENCOUNTER — Encounter (HOSPITAL_BASED_OUTPATIENT_CLINIC_OR_DEPARTMENT_OTHER): Payer: BC Managed Care – PPO | Attending: Plastic Surgery

## 2014-04-06 DIAGNOSIS — L98491 Non-pressure chronic ulcer of skin of other sites limited to breakdown of skin: Secondary | ICD-10-CM | POA: Diagnosis not present

## 2014-04-07 NOTE — Progress Notes (Signed)
[facility]  NAME:  [name] [name], [name] [name] NO.:  [id number]  MEDICAL RECORD NO.:  [id number]      DATE OF BIRTH:  [birth year]  PHYSICIAN:  [name], DO            VISIT DATE:                                  OFFICE VISIT   The patient is a 48 year old male who is here for postop followup on his abdominal ulcer and the incisional VAC was removed and he appears to be healed.  The skin is together.  No sign of infection.  No redness.  We will discharge him.  I will see him in my office in 2 weeks and in the meantime, we have encouraged offloading multivitamin, vitamin C, zinc, and he can shower.     [name], DO     CS/MEDQ  D:  04/06/2014  T:  04/07/2014  Job:  [id number]

## 2014-04-08 NOTE — Telephone Encounter (Signed)
LM again on preferred phone number for pt's mother to return my call. If mother/patient has not returned my call by tomorrow, I will mail a certified letter to patient. [name]

## 2014-04-10 ENCOUNTER — Encounter: Payer: Self-pay | Admitting: Pulmonary Disease

## 2014-04-10 NOTE — Telephone Encounter (Signed)
Still unable to reach patient and pt hasn't returned any of the calls. Certified letter mailed to patient to contact our office.  This phone message has been closed. Pt has appointment on 04/20/14 with Dr. Halford Chessman. [name]

## 2014-04-13 DIAGNOSIS — Z483 Aftercare following surgery for neoplasm: Secondary | ICD-10-CM | POA: Diagnosis not present

## 2014-04-13 DIAGNOSIS — S21102A Unspecified open wound of left front wall of thorax without penetration into thoracic cavity, initial encounter: Secondary | ICD-10-CM | POA: Diagnosis not present

## 2014-04-13 DIAGNOSIS — J449 Chronic obstructive pulmonary disease, unspecified: Secondary | ICD-10-CM | POA: Diagnosis not present

## 2014-04-13 DIAGNOSIS — T8189XA Other complications of procedures, not elsewhere classified, initial encounter: Secondary | ICD-10-CM | POA: Diagnosis not present

## 2014-04-20 ENCOUNTER — Ambulatory Visit: Payer: Self-pay | Admitting: Radiation Oncology

## 2014-04-20 ENCOUNTER — Ambulatory Visit (INDEPENDENT_AMBULATORY_CARE_PROVIDER_SITE_OTHER)
Admission: RE | Admit: 2014-04-20 | Discharge: 2014-04-20 | Disposition: A | Discharge status: Home / Self Care | Payer: BC Managed Care – PPO | Source: Ambulatory Visit | Attending: Pulmonary Disease | Admitting: Pulmonary Disease

## 2014-04-20 ENCOUNTER — Encounter: Payer: Self-pay | Admitting: Pulmonary Disease

## 2014-04-20 ENCOUNTER — Ambulatory Visit (INDEPENDENT_AMBULATORY_CARE_PROVIDER_SITE_OTHER): Payer: BC Managed Care – PPO | Admitting: Pulmonary Disease

## 2014-04-20 VITALS — BP 130/88 | HR 94 | Temp 98.3°F | Ht 69.0 in | Wt >= 6400 oz

## 2014-04-20 DIAGNOSIS — J9811 Atelectasis: Secondary | ICD-10-CM

## 2014-04-20 DIAGNOSIS — G4733 Obstructive sleep apnea (adult) (pediatric): Secondary | ICD-10-CM

## 2014-04-20 NOTE — Assessment & Plan Note (Signed)
Repeat CXR and call him with results.

## 2014-04-20 NOTE — Patient Instructions (Signed)
Chest xray today  Follow up in 3 months 

## 2014-04-20 NOTE — Assessment & Plan Note (Signed)
He is to continue BiPAP qhs.  He will check with his surgeon about how to arrange for wound care while he gets sleep study.  Might need to have sleep study done during the day, and then he can be monitored by wound care center next to sleep lab if needed.

## 2014-04-20 NOTE — Progress Notes (Signed)
Chief Complaint  Patient presents with  . Follow-up    pt still has not had sleep study done, unable to find somewhere that can accomodate his needs.     History of Present Illness: Atzin Gauger is a 48 y.o. male with wheezing with upper airway cough.  He also has OSA.  He has not been able to get sleep study set up yet.  He still has been using BiPAP and this helps tremendously.  He is worried he will need to pay for machine if he can't get sleep study done.  TESTS: Echo 12/19/13 >> EF 45 to 50%, mild LVH  Past medical hx, Past surgical hx, Medications, Allergies, Family hx, Social hx all reviewed.   Physical Exam:  General - No distress, in wheel chair ENT - No sinus tenderness, no oral exudate, no LAN, 2+ tonsills Cardiac - s1s2 regular, no murmur Chest - No wheeze/rales/dullness Back - No focal tenderness Abd - wound dressing clean Ext - 1+ edema Neuro - Normal strength Skin - No rashes Psych - normal mood, and behavior   Assessment/Plan:  Chesley Mires, MD [facility]/Sleep Pager:  [phone]

## 2014-04-21 ENCOUNTER — Telehealth: Payer: Self-pay | Admitting: Pulmonary Disease

## 2014-04-21 NOTE — Telephone Encounter (Signed)
Dg Chest 1 View  04/20/2014   CLINICAL DATA:  Initial encounter for acute respiratory failure, personal history of congestive heart failure and hypertension  EXAM: CHEST - 1 VIEW  COMPARISON:  02/16/2014  FINDINGS: Elevated right diaphragm. Mild cardiac enlargement. Chronic bilateral rib fractures. Vascular pattern normal and lungs clear.  IMPRESSION: No acute findings with stable cardiac enlargement and right diaphragm elevation.   Electronically Signed   By: Skipper Cliche M.D.   On: 04/20/2014 17:02    Will have my nurse inform pt that there is no change in CXR findings compared to previous x-rays.  No change to current tx plan.  He does not need repeat CXR unless he develops new respiratory symptoms.

## 2014-04-22 NOTE — Telephone Encounter (Signed)
LMTCB x 1 

## 2014-04-23 NOTE — Telephone Encounter (Signed)
Patient returning call.  [phone]

## 2014-04-26 NOTE — Progress Notes (Signed)
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 Pituitary adenoma 227.3 D35.2    Narrative:  The patient was seen in consultation on 12/30/2013. To review, he presented on 12/06/13 with a 3 day history of occipital headache as severe as 10/10 at times. He had some associated nausea and blurred vision. Head CT on 12/06/13 demonstrated a sellar and suprasellar mass with invasion and expansion of the right cavernous sinus and extension into the sphenoid sinus.    Brain MRI on 12/07/13 further characterized the mass as a 3.7 x 4.1 x 3.4 cm mass with the epicenter in the region of the sella/clivus. There was invasion of the cavernous sinus on the right, surrounding the carotid artery. Early invasion on the left is possible. The component extending into the suprasellar region measures 14 mm in diameter and indents the undersurface of the optic chiasm. The lesion shows variable enhancement.     Clinically, he was noted to have ptosis in the right with enlarged pupil. He suffers with obesity and has had a chronic abdominal wound which was evaluated by infectious disease.  Endocrine work-up on 7/4 showed:  Results for [name] [name], [name] [name] (MRN [id number]) as of 12/30/2013 15:22  Ref. Range 12/06/2013 12:10  Cortisol, Plasma No range found 31.2  Growth Hormone Latest Range: 0.00-3.00 ng/mL 0.39  LH Latest Range: 1.5-9.3 mIU/mL <0.1 (L)  FSH Latest Range: 1.4-18.1 mIU/mL <0.3 (L)  Prolactin Latest Range: 2.1-17.1 ng/mL 11.1  Somatomedin C Latest Range: 55-213 ng/mL 121  TSH Latest Range: 0.350-4.500 uIU/mL 0.137 (L)  Free T4 Latest Range: 0.80-1.80 ng/dL 0.81    Results for [name] [name], [name] [name] (MRN [id number]) as of 12/30/2013 15:22  Ref. Range 12/18/2013 12:00  Sex Hormone Binding Latest Range: 13-71 nmol/L 18  Testosterone Latest Range: 300-890 ng/dL 69 (L)  Testosterone Free Latest Range: 47.0-244.0 pg/mL 17.1 (L)    The patient proceeded to transphenoidal resection of the tumor on 12/10/13 with Dr. Joya Salm.    Pathology confirmed pituitary adenoma, confirmed at [facility].  Post-op MRI showed complete removal of the suprasellar tumor component with residual invasion of the cavernous sinuses and clivus.    Post-operatively, the patient was recovering from surgery with no change in presenting cranial nerve dysfunction.  I recommended that he undergo adjuvant radiotherapy to preserve vision. The patient was not felt to be an ideal candidate for stereotactic radiosurgery given the size of the adenoma. I felt that he would be a good candidate for conventionally fractionated conformal radiation. However, we're unable to proceed with radiotherapy due to the patient's morbid obesity with a weight exceeding 454 pounds. I recommended that he proceed with efforts to lose weight in order to consider radiotherapy treatment. During this time, patient has proceeded with ongoing management of his abdominal wound. By report, his weight is now in the range to possibly receive radiation treatment.                              ALLERGIES:  is allergic to oxycodone; penicillins; and morphine and related.  Meds: Current Outpatient Prescriptions  Medication Sig Dispense Refill  . albuterol (PROVENTIL HFA;VENTOLIN HFA) 108 (  90 BASE) MCG/ACT inhaler Inhale 2 puffs into the lungs every 6 (six) hours as needed for wheezing or shortness of breath.    . allopurinol (ZYLOPRIM) 300 MG tablet Take 300 mg by mouth daily.    Marland Kitchen buPROPion (WELLBUTRIN XL) 300 MG 24 hr tablet Take 300 mg by mouth daily.    . carboxymethylcellulose (REFRESH TEARS) 0.5 % SOLN Place 2 drops  into both eyes daily as needed (for dry eyes).    . cyclobenzaprine (FLEXERIL) 10 MG tablet Take 10 mg by mouth daily as needed for muscle spasms.    . diclofenac sodium (VOLTAREN) 1 % GEL Apply 4 g topically daily as needed (pain).    . Diclofenac-Misoprostol (ARTHROTEC) 75-0.2 MG TBEC Take 1 tablet by mouth 2 (two) times daily.     Marland Kitchen HYDROcodone-acetaminophen (NORCO) 7.5-325 MG per tablet Take 1 tablet by mouth every 6 (six) hours as needed for moderate pain.    Marland Kitchen levothyroxine (SYNTHROID, LEVOTHROID) 75 MCG tablet Take 75 mcg by mouth daily before breakfast.    . lidocaine (XYLOCAINE) 2 % jelly     . Multiple Vitamin (MULTIVITAMIN) tablet Take 1 tablet by mouth daily. Centrum brand    . Nutritional Supplements (JUVEN PO) Take 1 packet by mouth 2 (two) times daily. Wound healing    . ondansetron (ZOFRAN) 8 MG tablet Take 8 mg by mouth every 8 (eight) hours as needed for nausea.    . sertraline (ZOLOFT) 100 MG tablet Take 200 mg by mouth daily.    . traMADol (ULTRAM) 50 MG tablet     . triamcinolone cream (KENALOG) 0.1 %     . vitamin C (ASCORBIC ACID) 500 MG tablet Take 500 mg by mouth daily.    . Zinc 50 MG TABS Take 50 mg by mouth daily.    Marland Kitchen ALPRAZolam (XANAX) 0.25 MG tablet Take 1 tablet (0.25 mg total) by mouth every 6 (six) hours as needed for anxiety. (Patient not taking: Reported on 04/27/2014) 30 tablet 0  . fluticasone (FLONASE) 50 MCG/ACT nasal spray Place 1 spray into both nostrils daily as needed for allergies or rhinitis.     Marland Kitchen topiramate (TOPAMAX) 50 MG tablet Take 1 tablet (50 mg total) by mouth 2 (two) times daily. (Patient not taking: Reported on 04/27/2014) 60 tablet 1   No current facility-administered medications for this encounter.    Physical Findings: The patient is in no acute distress. Patient is alert and oriented.  height is 5\' 9"  (1.753 m) and weight is 430 lb (195.047 kg). His blood pressure is 150/94 and his pulse is 91. His respiration is 18. Marland Kitchen His neurologic  exam has significantly improved with resolution of previously noted cranial nerve dysfunction. No significant changes.  Lab Findings: Lab Results  Component Value Date   WBC 6.4 04/01/2014   HGB 13.1 04/01/2014   HCT 41.6 04/01/2014   MCV 89.1 04/01/2014   PLT 157 04/01/2014    @LASTCHEM @  Radiographic Findings: Dg Chest 1 View  04/20/2014   CLINICAL DATA:  Initial encounter for acute respiratory failure, personal history of congestive heart failure and hypertension  EXAM: CHEST - 1 VIEW  COMPARISON:  02/16/2014  FINDINGS: Elevated right diaphragm. Mild cardiac enlargement. Chronic bilateral rib fractures. Vascular pattern normal and lungs clear.  IMPRESSION: No acute findings with stable cardiac enlargement and right diaphragm elevation.   Electronically Signed   By: Skipper Cliche M.D.   On: 04/20/2014 17:02    Impression:  The patient underwent  debulking of a large pituitary adenoma and would benefit from adjuvant radiotherapy. Treatment has been delayed to accommodate some weight loss over to treat the patient while respecting the weight limit tolerance of the linear accelerators. He appears to achieved adequate weight to proceed.  Plan:  Today, I spent time talking to the patient and his mom about the findings and workup which have led to this point. We reviewed the MRI and CT findings. I expect the rationale for radiation treatment and the rationale for conventional fractionation rather than stereotactic radiosurgery. I filled out a patient counseling form for him and we retained a copy for our records. The former showed a diagram of the treatment location as well as the description of the logistics, delivery, acute effects, long-term effects and results related radiation treatment. The patient would like to proceed as planned. We will schedule a targeting MRI followed by radiation simulation. I would anticipate delivering a total dose of 50.4 Gy in 28 fractions of 1.8  Gy  _____________________________________  Sheral Apley Tammi Klippel, M.D.

## 2014-04-27 ENCOUNTER — Ambulatory Visit
Admission: RE | Admit: 2014-04-27 | Discharge: 2014-04-27 | Disposition: A | Discharge status: Home / Self Care | Payer: BC Managed Care – PPO | Source: Ambulatory Visit | Attending: Radiation Oncology | Admitting: Radiation Oncology

## 2014-04-27 ENCOUNTER — Encounter: Payer: Self-pay | Admitting: Radiation Oncology

## 2014-04-27 ENCOUNTER — Other Ambulatory Visit: Payer: Self-pay | Admitting: Radiation Therapy

## 2014-04-27 VITALS — BP 150/94 | HR 91 | Resp 18 | Ht 69.0 in | Wt >= 6400 oz

## 2014-04-27 DIAGNOSIS — D352 Benign neoplasm of pituitary gland: Secondary | ICD-10-CM | POA: Insufficient documentation

## 2014-04-27 DIAGNOSIS — Z510 Encounter for antineoplastic radiation therapy: Secondary | ICD-10-CM | POA: Insufficient documentation

## 2014-04-27 DIAGNOSIS — D497 Neoplasm of unspecified behavior of endocrine glands and other parts of nervous system: Secondary | ICD-10-CM

## 2014-04-27 NOTE — Progress Notes (Signed)
Right abdominal wound vac noted. Continues to have a cell weekly treatments for abdominal wound. Reports slow progress with healing of abdominal wound. Reports being given a script in the hospital for topamax. Reports he has ran out of topamax but, wasn't sure who to call for a refill or if he even needed it. Reports he has experience two headaches over the course of the last month but, they are less severe than, when he was hospitalized. Reports blurred vision continues. Denies floater or diplopia. Reports blurred vision is worse when he is fatigued. Reports intermittent nausea relieved with zofran. 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 Patient stood for weight but, scale was unable to determine patient's weight. Patient reports severe bilateral knee pain upon standing. Elevated blood pressure.

## 2014-04-27 NOTE — Progress Notes (Signed)
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 Joya Salm  Past or anticipated interventions, if any, per medical oncology: no  Dose of Decadron, if applicable: no  Recent neurologic symptoms, if any:   Seizures: no  Headaches: yes  Nausea: yes  Dizziness/ataxia: no  Difficulty with hand coordination: no  Focal numbness/weakness: no  Visual deficits/changes: yes  Confusion/Memory deficits: no  Painful bone metastases at present, if any: no  SAFETY ISSUES:  Prior radiation? no  Pacemaker/ICD? no  Possible current pregnancy? no  Is the patient on methotrexate? no  Additional Complaints / other details: 48 year old male. He suffers with obesity and has had a chronic abdominal wound. Dr. Tammi Klippel recommends adjuvant radiotherapy to preserve vision.

## 2014-04-27 NOTE — Progress Notes (Signed)
See progress note under physician encounter. 

## 2014-04-28 ENCOUNTER — Other Ambulatory Visit: Payer: Self-pay | Admitting: Radiation Oncology

## 2014-04-28 DIAGNOSIS — D352 Benign neoplasm of pituitary gland: Secondary | ICD-10-CM

## 2014-04-28 DIAGNOSIS — F40240 Claustrophobia: Secondary | ICD-10-CM

## 2014-04-28 DIAGNOSIS — F4323 Adjustment disorder with mixed anxiety and depressed mood: Secondary | ICD-10-CM

## 2014-04-28 MED ORDER — ALPRAZOLAM 0.25 MG PO TABS: 0.2500 mg | ORAL_TABLET | Freq: Four times a day (QID) | ORAL | Status: DC | PRN

## 2014-04-28 NOTE — Telephone Encounter (Signed)
LMTCB x 1 

## 2014-04-29 ENCOUNTER — Telehealth: Payer: Self-pay | Admitting: Radiation Oncology

## 2014-04-29 NOTE — Telephone Encounter (Signed)
Per Dr. Johny Shears order phoned in xanax. Then, called patient's mother to inform her this was done. She insisted the xanax isn't strong enough. Explains her son had a panic attack before his last scan. She insist upon "something stronger like valium or ativan."

## 2014-04-29 NOTE — Telephone Encounter (Signed)
Lmtcb. 11/25

## 2014-05-01 NOTE — Telephone Encounter (Signed)
lmtcb x 3 LM with mother Will ATC to call 1 more time, then message will be closed and letter mailed to patient.

## 2014-05-03 ENCOUNTER — Other Ambulatory Visit: Payer: Self-pay | Admitting: Radiation Oncology

## 2014-05-03 DIAGNOSIS — D352 Benign neoplasm of pituitary gland: Secondary | ICD-10-CM

## 2014-05-03 MED ORDER — LORAZEPAM 1 MG PO TABS: 1.0000 mg | ORAL_TABLET | Freq: Three times a day (TID) | ORAL | Status: DC

## 2014-05-04 ENCOUNTER — Telehealth: Payer: Self-pay | Admitting: Radiation Oncology

## 2014-05-04 NOTE — Telephone Encounter (Signed)
Per Dr. Johny Shears order called in Ativan 1 mg to [facility]. Then, phoned patient's mother, Chong Sicilian, informing her this was done. Patty requested this RN inform Manuela Schwartz the "dates are perfect."

## 2014-05-04 NOTE — Telephone Encounter (Signed)
Pt returning call for results.[name] [name] ° °

## 2014-05-04 NOTE — Telephone Encounter (Signed)
Results have been explained to patient, pt expressed understanding. Nothing further needed.  

## 2014-05-15 ENCOUNTER — Ambulatory Visit (HOSPITAL_COMMUNITY)
Admission: RE | Admit: 2014-05-15 | Discharge: 2014-05-15 | Disposition: A | Discharge status: Home / Self Care | Payer: BC Managed Care – PPO | Source: Ambulatory Visit | Attending: Radiation Oncology | Admitting: Radiation Oncology

## 2014-05-15 DIAGNOSIS — D352 Benign neoplasm of pituitary gland: Secondary | ICD-10-CM | POA: Insufficient documentation

## 2014-05-15 LAB — POCT I-STAT CREATININE: Creatinine, Ser: 1 mg/dL (ref 0.50–1.35)

## 2014-05-15 MED ORDER — GADOBENATE DIMEGLUMINE 529 MG/ML IV SOLN: 20.0000 mL | Freq: Once | INTRAVENOUS | Status: AC | PRN

## 2014-05-18 ENCOUNTER — Telehealth: Payer: Self-pay | Admitting: *Deleted

## 2014-05-18 ENCOUNTER — Ambulatory Visit
Admission: RE | Admit: 2014-05-18 | Discharge: 2014-05-18 | Disposition: A | Discharge status: Home / Self Care | Payer: BC Managed Care – PPO | Source: Ambulatory Visit | Attending: Radiation Oncology | Admitting: Radiation Oncology

## 2014-05-18 DIAGNOSIS — D352 Benign neoplasm of pituitary gland: Secondary | ICD-10-CM

## 2014-05-18 NOTE — Progress Notes (Signed)
Simulation cancelled. No vitals collected. Dr. Tammi Klippel to briefly speak with patient and his mother.

## 2014-05-18 NOTE — Telephone Encounter (Signed)
CALLED PATIENT TO INFORM OF APPT. WITH DR. Kathrin Penner ON 07-16-14- ARRIVAL TIME - 1:15 PM, ADDRESS- [address] PH. NO. - [phone], LVM FOR A RETURN CALL

## 2014-05-18 NOTE — Progress Notes (Signed)
Radiation Oncology         [phone]   Name: [name] [name]   Date: 05/18/2014   MRN: [id number]  DOB: 1966/01/31    Multidisciplinary Brain and Spine Oncology Clinic Follow-Up Visit Note  CC: [name], MD  Floyce Stakes, MD  No diagnosis found.  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  The recent films were presented in our multidisciplinary conference with neuroradiology just prior to the clinic.  His vision is intact with no headaches.  ALLERGIES:  is allergic to oxycodone; penicillins; and morphine and related.  Meds: Current Outpatient Prescriptions  Medication Sig Dispense Refill  . albuterol (PROVENTIL HFA;VENTOLIN HFA) 108 (90 BASE) MCG/ACT inhaler Inhale 2 puffs into the lungs every 6 (six) hours as needed for wheezing or shortness of breath.    . allopurinol (ZYLOPRIM) 300 MG tablet Take 300 mg by mouth daily.    Marland Kitchen ALPRAZolam (XANAX) 0.25 MG tablet Take 1 tablet (0.25 mg total) by mouth every 6 (six) hours as needed for anxiety. 30 tablet 0  . buPROPion (WELLBUTRIN XL) 300 MG 24 hr tablet Take 300 mg by mouth daily.    . carboxymethylcellulose (REFRESH TEARS) 0.5 % SOLN Place 2 drops into both eyes daily as needed (for dry eyes).    . cyclobenzaprine (FLEXERIL) 10 MG tablet Take 10 mg by mouth daily as needed for muscle spasms.    . diclofenac sodium (VOLTAREN) 1 % GEL Apply 4 g topically daily as needed (pain).    . Diclofenac-Misoprostol (ARTHROTEC) 75-0.2 MG TBEC Take 1 tablet by mouth 2 (two) times daily.     . fluticasone (FLONASE) 50 MCG/ACT nasal spray Place 1 spray into both nostrils daily as needed for allergies or rhinitis.     Marland Kitchen HYDROcodone-acetaminophen (NORCO) 7.5-325 MG per tablet Take 1 tablet by mouth every 6 (six) hours as needed for moderate pain.    Marland Kitchen levothyroxine (SYNTHROID, LEVOTHROID) 75 MCG tablet  Take 75 mcg by mouth daily before breakfast.    . lidocaine (XYLOCAINE) 2 % jelly     . LORazepam (ATIVAN) 1 MG tablet Take 1 tablet (1 mg total) by mouth every 8 (eight) hours. 30 tablet 0  . Multiple Vitamin (MULTIVITAMIN) tablet Take 1 tablet by mouth daily. Centrum brand    . Nutritional Supplements (JUVEN PO) Take 1 packet by mouth 2 (two) times daily. Wound healing    . ondansetron (ZOFRAN) 8 MG tablet Take 8 mg by mouth every 8 (eight) hours as needed for nausea.    . sertraline (ZOLOFT) 100 MG tablet Take 200 mg by mouth daily.    Marland Kitchen topiramate (TOPAMAX) 50 MG tablet Take 1 tablet (50 mg total) by mouth 2 (two) times daily. (Patient not taking: Reported on 04/27/2014) 60 tablet 1  . traMADol (ULTRAM) 50 MG tablet     . triamcinolone cream (KENALOG) 0.1 %     . vitamin C (ASCORBIC ACID) 500 MG tablet Take 500 mg by mouth daily.    . Zinc 50 MG TABS Take 50 mg by mouth daily.     No current facility-administered medications for this encounter.    Physical Findings: The patient is in no acute distress. Patient is alert and oriented.  vitals were not taken for this visit..  No significant changes.  Lab Findings: Lab Results  Component Value Date   WBC 6.4 04/01/2014  HGB 13.1 04/01/2014   HCT 41.6 04/01/2014   MCV 89.1 04/01/2014   PLT 157 04/01/2014    @LASTCHEM @  Radiographic Findings: Dg Chest 1 View  04/20/2014   CLINICAL DATA:  Initial encounter for acute respiratory failure, personal history of congestive heart failure and hypertension  EXAM: CHEST - 1 VIEW  COMPARISON:  02/16/2014  FINDINGS: Elevated right diaphragm. Mild cardiac enlargement. Chronic bilateral rib fractures. Vascular pattern normal and lungs clear.  IMPRESSION: No acute findings with stable cardiac enlargement and right diaphragm elevation.   Electronically Signed   By: Skipper Cliche M.D.   On: 04/20/2014 17:02   [name] EV Contrast  05/15/2014   CLINICAL DATA:  Pituitary resection. Stereotactic  radio surgical planning.  EXAM: MRI HEAD WITHOUT AND WITH CONTRAST  TECHNIQUE: Multiplanar, multiecho pulse sequences of the brain and surrounding structures were obtained without and with intravenous contrast.  CONTRAST:  20 mL MultiHance IV  COMPARISON:  MRI 12/24/2013  FINDINGS: Image quality degraded by large patient size is well as motion.  Transsphenoidal debulking of large pituitary tumor. The recent MRI was a postop study. Residual tumor is present in the right side of the sella extending into the right cavernous sinus. There is bulky tumor in the right cavernous sinus and surrounding the right cavernous carotid. The normal pituitary gland is deviated to the left. Infundibulum is deviated to the left.  Improvement in enhancing tissue in the left side of the sella felt to be postoperative change. There also is improvement in enhancement of the left cavernous sinus likely related to resolving postoperative change. No definite tumor in the left cavernous sinus although a small amount of tumor could be present. No compression of the optic chiasm. Interval improvement in the appearance of the floor of the sella and clivus which may be due to healing of postsurgical change.  Ventricle size is normal. No acute infarct. Small white matter hyperintensities in the frontal white matter bilaterally consistent with chronic microvascular ischemia. Brainstem and cerebellum are normal.  No other enhancing abnormality is seen post contrast infusion.  IMPRESSION: Postop debulking of pituitary macroadenoma. There remains bulky tumor in the right cavernous sinus surrounding the right cavernous carotid. Residual pituitary is present on the left side of the sella. Improvement in appearance of the floor of the sella and clivus and also improvement in the appearance of the left cavernous sinus. No definite tumor in the left cavernous sinus and there is no compression of the optic chiasm.   Electronically Signed   By: Franchot Gallo  M.D.   On: 05/15/2014 19:22    Impression:  The patient's MRI shows no interval growth of his residual pituitary adenoma.  I had initially planned adjuvant radiotherapy to prevent recurrence, but, in our multidisciplinary brain tumor conference the prevailing consensus was continued surveillance reserving radiotherapy for salvage in the event of tumor growth/ symptoms.  Plan:  I shared the consensus recommendation for observation with the patient and his mother.  They were relieved.  I have referred him to [facility] for baseline visual evaluation and visual field testing.  We wil plan to repeat brain MRI evey 3 months for one year, and then longer intervals if stable.  _____________________________________  Sheral Apley. Tammi Klippel, M.D.

## 2014-07-15 ENCOUNTER — Other Ambulatory Visit: Payer: Self-pay | Admitting: Radiation Therapy

## 2014-07-15 DIAGNOSIS — D352 Benign neoplasm of pituitary gland: Secondary | ICD-10-CM

## 2014-07-22 ENCOUNTER — Telehealth: Payer: Self-pay | Admitting: *Deleted

## 2014-07-22 NOTE — Telephone Encounter (Signed)
On 07-22-14 fax medical records to [facility] it was consult note, end of tx note, follow up note.

## 2014-08-13 ENCOUNTER — Ambulatory Visit (HOSPITAL_COMMUNITY): Payer: BLUE CROSS/BLUE SHIELD

## 2014-08-17 ENCOUNTER — Ambulatory Visit: Payer: Self-pay | Admitting: Radiation Oncology

## 2014-08-28 ENCOUNTER — Ambulatory Visit (HOSPITAL_COMMUNITY)
Admission: RE | Admit: 2014-08-28 | Discharge: 2014-08-28 | Disposition: A | Discharge status: Home / Self Care | Payer: BLUE CROSS/BLUE SHIELD | Source: Ambulatory Visit | Attending: Radiation Oncology | Admitting: Radiation Oncology

## 2014-08-28 DIAGNOSIS — D352 Benign neoplasm of pituitary gland: Secondary | ICD-10-CM | POA: Diagnosis present

## 2014-08-28 LAB — CREATININE, SERUM
Creatinine, Ser: 0.93 mg/dL (ref 0.50–1.35)
GFR calc Af Amer: >90 mL/min (ref >90)
GFR calc non Af Amer: >90 mL/min (ref >90)

## 2014-08-28 MED ORDER — GADOBENATE DIMEGLUMINE 529 MG/ML IV SOLN: 10.0000 mL | Freq: Once | INTRAVENOUS | Status: AC | PRN

## 2014-09-06 NOTE — Progress Notes (Signed)
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 Pituitary adenoma 227.3 D35.2     Interval Since Last Radiation:  N/A  Narrative:  The patient returns today for routine follow-up.  Unable to weigh on clinic scale due to tremors. Patient reports weight on scale at home 3 days ago was 418 lb. Patient and mother attempting to loss weight using the weight watchers plan. 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 Joints continue to ache and patient continues to feel fatigued. Reports occasional nausea but, denies emesis. Reports occasional sinus headaches. Denies diplopia or ringing in the ears  The patient has had elevated ACTH pre-op.  Dr. Chalmers Cater has evaluated the patient and is concerned about Cushing's Syndrome due to subsequently elevated ACTH, shown below:       ALLERGIES:  is allergic to morphine; oxycodone; penicillins; buprenorphine hcl; and morphine and related.  Meds: Current Outpatient Prescriptions  Medication Sig Dispense Refill  . albuterol (PROVENTIL HFA;VENTOLIN HFA) 108 (90 BASE) MCG/ACT inhaler Inhale 2 puffs into the lungs every 6 (six) hours as needed for wheezing or shortness of breath.    . allopurinol (ZYLOPRIM) 300 MG tablet Take 300 mg by mouth daily.    Marland Kitchen ALPRAZolam (XANAX) 0.25 MG tablet Take 1 tablet (0.25 mg total) by mouth every 6 (six) hours as needed for anxiety. 30 tablet 0  . buPROPion (WELLBUTRIN XL) 300 MG 24 hr tablet Take 300 mg by mouth daily.    . carboxymethylcellulose (REFRESH TEARS) 0.5 % SOLN Place 2 drops into both eyes daily as needed (for dry eyes).    . cyclobenzaprine (FLEXERIL) 10 MG tablet Take 10 mg by  mouth daily as needed for muscle spasms.    . diclofenac sodium (VOLTAREN) 1 % GEL Apply 4 g topically daily as needed (pain).    . Diclofenac-Misoprostol (ARTHROTEC) 75-0.2 MG TBEC Take 1 tablet by mouth 2 (two) times daily.     . fluticasone (FLONASE) 50 MCG/ACT nasal spray Place 1 spray into both nostrils daily as needed for allergies or rhinitis.     Marland Kitchen HYDROcodone-acetaminophen (NORCO) 7.5-325 MG per tablet Take 1 tablet by mouth every 6 (six) hours as needed for moderate pain.    Marland Kitchen levothyroxine (SYNTHROID, LEVOTHROID) 75 MCG tablet Take 75 mcg by mouth daily before breakfast.    . lidocaine (XYLOCAINE) 2 % jelly     . magnesium 30 MG tablet Take 30 mg by mouth 2 (two) times daily.    . Multiple Vitamin (MULTIVITAMIN) tablet Take 1 tablet by mouth daily. Centrum brand    . Nutritional Supplements (JUVEN PO) Take 1 packet by mouth 2 (two) times daily. Wound healing    . ondansetron (ZOFRAN) 8 MG tablet Take 8 mg by mouth every 8 (eight) hours as needed for nausea.    . sertraline (ZOLOFT) 100 MG tablet Take 200 mg by mouth daily.    . vitamin B-12 (CYANOCOBALAMIN) 1000 MCG tablet Take 1,000 mcg by mouth daily.    . vitamin C (ASCORBIC ACID) 500 MG tablet Take 500 mg by mouth daily.    . Zinc 50 MG TABS Take 50 mg by mouth daily.    Marland Kitchen  LORazepam (ATIVAN) 1 MG tablet Take 1 tablet (1 mg total) by mouth every 8 (eight) hours. (Patient not taking: Reported on 09/07/2014) 30 tablet 0  . topiramate (TOPAMAX) 50 MG tablet Take 1 tablet (50 mg total) by mouth 2 (two) times daily. (Patient not taking: Reported on 04/27/2014) 60 tablet 1  . traMADol (ULTRAM) 50 MG tablet     . triamcinolone cream (KENALOG) 0.1 %      No current facility-administered medications for this encounter.    Physical Findings: The patient is in no acute distress. Patient is alert and oriented.  weight is 418 lb (189.604 kg). His blood pressure is 155/93 and his pulse is 107. His respiration is 18. .  No significant  changes.  Lab Findings: Lab Results  Component Value Date   WBC 6.4 04/01/2014   HGB 13.1 04/01/2014   HCT 41.6 04/01/2014   PLT 157 04/01/2014    Lab Results  Component Value Date   NA 143 04/01/2014   K 4.0 04/01/2014   CO2 23 04/01/2014   GLUCOSE 96 04/01/2014   BUN 21 04/01/2014   CREATININE 0.93 08/28/2014   BILITOT 0.4 12/06/2013   ALKPHOS 100 12/06/2013   AST 23 12/06/2013   ALT 35 12/06/2013   PROT 7.1 12/06/2013   ALBUMIN 3.9 12/06/2013   CALCIUM 9.2 04/01/2014   ANIONGAP 13 04/01/2014    Radiographic Findings: [name] LD Contrast  08/28/2014   CLINICAL DATA:  Pituitary macroadenoma.  EXAM: MRI HEAD WITHOUT AND WITH CONTRAST  TECHNIQUE: Multiplanar, multiecho pulse sequences of the brain and surrounding structures were obtained without and with intravenous contrast.  CONTRAST:  10 mL MultiHance  COMPARISON:  MRI of the pituitary 05/15/2014  FINDINGS: Scattered periventricular and subcortical T2 hyperintensities are similar to the prior study. No acute infarct, parenchymal mass, or hemorrhage is present.  The study is moderately degraded by patient motion.  Dedicated imaging is of the pituitary demonstrate stable leftward deviation of the pituitary stalk. There is asymmetric soft tissue on the right with probable residual tumor into the right cavernous sinus. This is not significantly changed. The optic chiasm demonstrates slight downward deviation without change.  Flow is present in the major intracranial arteries. The globes and orbits are intact. Mild mucosal thickening is present in the left maxillary sinus. The remaining paranasal sinuses and the mastoid air cells are clear.  IMPRESSION: 1. Stable postoperative changes of the sella with pituitary debulking. 2. Stable leftward deviation of the pituitary stalk in slight down or tenting of the optic chiasm. 3. Stable tumor along the right side of the sella and into the cavernous sinus. 4. Stable subcortical white matter  changes.   Electronically Signed   By: San Morelle M.D.   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  This may need to be fractionated given proximity to optic nerve.  Plan:  Proceed with 1.5T MRI on [facility] open machine and re-eval on 4/18  _____________________________________  Sheral Apley. Tammi Klippel, M.D.

## 2014-09-07 ENCOUNTER — Ambulatory Visit: Payer: Self-pay | Admitting: Radiation Oncology

## 2014-09-07 ENCOUNTER — Ambulatory Visit
Admission: RE | Admit: 2014-09-07 | Discharge: 2014-09-07 | Disposition: A | Discharge status: Home / Self Care | Payer: BLUE CROSS/BLUE SHIELD | Source: Ambulatory Visit | Attending: Radiation Oncology | Admitting: Radiation Oncology

## 2014-09-07 VITALS — BP 155/93 | HR 107 | Resp 18 | Wt >= 6400 oz

## 2014-09-07 DIAGNOSIS — D352 Benign neoplasm of pituitary gland: Secondary | ICD-10-CM

## 2014-09-07 DIAGNOSIS — F40240 Claustrophobia: Secondary | ICD-10-CM

## 2014-09-07 DIAGNOSIS — F4323 Adjustment disorder with mixed anxiety and depressed mood: Secondary | ICD-10-CM

## 2014-09-07 MED ORDER — ALPRAZOLAM 0.25 MG PO TABS: 0.2500 mg | ORAL_TABLET | Freq: Four times a day (QID) | ORAL | Status: DC | PRN

## 2014-09-07 NOTE — Addendum Note (Signed)
Encounter addended by: Tyler Pita, MD on: 09/07/2014  7:50 PM<BR>     Documentation filed: [employer], Orders

## 2014-09-07 NOTE — Progress Notes (Signed)
Unable to weigh on clinic scale due to tremors. Patient reports weight on scale at home 3 days ago was 418 lb. Patient and mother attempting to loss weight using the weight watchers plan. 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 Joints continue to ache and patient continues to feel fatigued. Reports occasional nausea but, denies emesis. Reports occasional sinus headaches. Denies diplopia or ringing in the ears.

## 2014-09-08 ENCOUNTER — Other Ambulatory Visit: Payer: Self-pay | Admitting: Radiation Therapy

## 2014-09-08 ENCOUNTER — Telehealth: Payer: Self-pay | Admitting: Radiation Oncology

## 2014-09-08 DIAGNOSIS — D352 Benign neoplasm of pituitary gland: Secondary | ICD-10-CM

## 2014-09-08 NOTE — Telephone Encounter (Signed)
Per Dr. Johny Shears order called in Xanax 0.25 mg to [facility]. Spoke with [employer]. Called patient's mother informing her this was done.

## 2014-09-18 ENCOUNTER — Ambulatory Visit
Admission: RE | Admit: 2014-09-18 | Discharge: 2014-09-18 | Disposition: A | Discharge status: Home / Self Care | Payer: BLUE CROSS/BLUE SHIELD | Source: Ambulatory Visit | Attending: Radiation Oncology | Admitting: Radiation Oncology

## 2014-09-18 DIAGNOSIS — D352 Benign neoplasm of pituitary gland: Secondary | ICD-10-CM

## 2014-09-18 MED ORDER — GADOBENATE DIMEGLUMINE 529 MG/ML IV SOLN
20.0000 mL | Freq: Once | INTRAVENOUS | Status: AC | PRN
  Administered 2014-09-18: 20 mL via INTRAVENOUS

## 2014-09-25 ENCOUNTER — Encounter: Payer: Self-pay | Admitting: Radiation Oncology

## 2014-09-25 ENCOUNTER — Ambulatory Visit
Admission: RE | Admit: 2014-09-25 | Discharge: 2014-09-25 | Disposition: A | Discharge status: Home / Self Care | Payer: BLUE CROSS/BLUE SHIELD | Source: Ambulatory Visit | Attending: Radiation Oncology | Admitting: Radiation Oncology

## 2014-09-25 VITALS — BP 162/112 | HR 94 | Resp 18 | Ht 73.0 in | Wt >= 6400 oz

## 2014-09-25 DIAGNOSIS — D352 Benign neoplasm of pituitary gland: Secondary | ICD-10-CM | POA: Insufficient documentation

## 2014-09-25 NOTE — Progress Notes (Signed)
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 Joya Salm  Past or anticipated interventions, if any, per medical oncology: no  Dose of Decadron, if applicable: no  Recent neurologic symptoms, if any:   Seizures: no  Headaches: yes; occasional  Nausea: Yes but, no emesis  Dizziness/ataxia: no  Difficulty with hand coordination: no  Focal numbness/weakness: no  Visual deficits/changes: yes  Confusion/Memory deficits: no  Painful bone metastases at present, if any: no  SAFETY ISSUES:  Prior radiation? no  Pacemaker/ICD? no  Possible current pregnancy? no  Is the patient on methotrexate? no  Additional Complaints / other details: 49 year old male. Over 400 lb suffers from Cushings. C/O aching joints and fatigue. Returns today to review most recent MRI.

## 2014-09-25 NOTE — Progress Notes (Signed)
  Radiation Oncology         [phone]) [phone] ________________________________  Name: [name] [name] MRN: [id number]  Date: 09/25/2014  DOB: [birth year]  SIMULATION AND TREATMENT PLANNING NOTE  No diagnosis found.  DIAGNOSIS:  49 yo gentleman with a 4.1 cm ACTH secretory pituitary adenoma s/p transphenoidal debulking  NARRATIVE:  The patient was brought to the [facility].  Identity was confirmed.  All relevant records and images related to the planned course of therapy were reviewed.  The patient freely provided informed written consent to proceed with treatment after reviewing the details related to the planned course of therapy. The consent form was witnessed and verified by the simulation staff.  Then, the patient was set-up in a stable reproducible  supine position for radiation therapy.  CT images were obtained.  Surface markings were placed.  The CT images were loaded into the planning software.  Then the target and avoidance structures were contoured.  Treatment planning then occurred.  The radiation prescription was entered and confirmed.  Then, I designed and supervised the construction of a total of 1 medically necessary complex treatment device in the form of a thermoplastic positioning mask.  I have requested : Intensity Modulated Radiotherapy (IMRT) is medically necessary for this case for the following reason:  Critical CNS structure avoidance - brainstem, optic chiasm, optic nerve..  I have ordered:Nutrition Consult  PLAN:  The patient will receive 52.2 Gy in 27 fraction.  This document serves as a record of services personally performed by Tyler Pita, MD. It was created on his behalf by Arlyce Harman, a trained medical scribe. The creation of this record is based on the scribe's personal observations and the provider's statements to them. This document has been checked and approved by the attending provider.      ________________________________  Sheral Apley.  Tammi Klippel, M.D.

## 2014-09-25 NOTE — Progress Notes (Signed)
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 Pituitary adenoma 227.3 D35.2     Interval Since Last Radiation:  N/A  Narrative:  The patient returns today for routine follow-up. The pt underwent high res brain MRI and returns today to review the results in consideration of proceeding with treatment. Joints continue to ache and patient continues to feel fatigued. Reports occasional nausea but, denies emesis. Reports occasional sinus headaches. Denies diplopia or ringing in the ears. MRI doesn't have any new findings, but shows good picture of target for radiation. No change, same size for tumor                                ALLERGIES:  is allergic to morphine; oxycodone; penicillins; buprenorphine hcl; and morphine and related.  Meds: Current Outpatient Prescriptions  Medication Sig Dispense Refill  . albuterol (PROVENTIL HFA;VENTOLIN HFA) 108 (90 BASE) MCG/ACT inhaler Inhale 2 puffs into the lungs every 6 (six) hours as needed for wheezing or shortness of breath.    . allopurinol (ZYLOPRIM) 300 MG tablet Take 300 mg by mouth daily.    Marland Kitchen ALPRAZolam (XANAX) 0.25 MG tablet Take 1 tablet (0.25 mg total) by mouth every 6 (six) hours as needed for anxiety. 30 tablet 0  . buPROPion (WELLBUTRIN XL) 300 MG 24 hr tablet Take 300 mg by mouth daily.    . carboxymethylcellulose (REFRESH TEARS) 0.5 % SOLN Place 2 drops into both eyes daily as needed (for dry eyes).    . cyclobenzaprine (FLEXERIL) 10 MG tablet Take 10 mg by mouth daily as needed for muscle spasms.    . diclofenac sodium (VOLTAREN) 1 % GEL Apply 4 g topically daily as needed (pain).    . Diclofenac-Misoprostol (ARTHROTEC)  75-0.2 MG TBEC Take 1 tablet by mouth 2 (two) times daily.     . fluticasone (FLONASE) 50 MCG/ACT nasal spray Place 1 spray into both nostrils daily as needed for allergies or rhinitis.     Marland Kitchen HYDROcodone-acetaminophen (NORCO) 7.5-325 MG per tablet Take 1 tablet by mouth every 6 (six) hours as needed for moderate pain.    Marland Kitchen levothyroxine (SYNTHROID, LEVOTHROID) 75 MCG tablet Take 75 mcg by mouth daily before breakfast.    . lidocaine (XYLOCAINE) 2 % jelly     . magnesium 30 MG tablet Take 30 mg by mouth 2 (two) times daily.    . Multiple Vitamin (MULTIVITAMIN) tablet Take 1 tablet by mouth daily. Centrum brand    . Nutritional Supplements (JUVEN PO) Take 1 packet by mouth 2 (two) times daily. Wound healing    . ondansetron (ZOFRAN) 8 MG tablet Take 8 mg by mouth every 8 (eight) hours as needed for nausea.    . sertraline (ZOLOFT) 100 MG tablet Take 200 mg by mouth daily.    Marland Kitchen topiramate (TOPAMAX) 50 MG tablet Take 1 tablet (50 mg total) by mouth 2 (two) times daily. 60 tablet 1  . traMADol (ULTRAM) 50 MG tablet     . triamcinolone cream (KENALOG) 0.1 %     . vitamin B-12 (CYANOCOBALAMIN) 1000 MCG tablet Take 1,000 mcg by mouth daily.    Marland Kitchen  vitamin C (ASCORBIC ACID) 500 MG tablet Take 500 mg by mouth daily.    . Zinc 50 MG TABS Take 50 mg by mouth daily.    Marland Kitchen LORazepam (ATIVAN) 1 MG tablet Take 1 tablet (1 mg total) by mouth every 8 (eight) hours. (Patient not taking: Reported on 09/25/2014) 30 tablet 0   No current facility-administered medications for this encounter.    Physical Findings: The patient is in no acute distress. Patient is alert and oriented.  height is 6\' 1"  (1.854 m) and weight is 426 lb 12.8 oz (193.595 kg). His blood pressure is 162/112 and his pulse is 94. His respiration is 18. .  No significant changes.  Lab Findings: Lab Results  Component Value Date   WBC 6.4 04/01/2014   HGB 13.1 04/01/2014   HCT 41.6 04/01/2014   PLT 157 04/01/2014    Lab Results  Component  Value Date   NA 143 04/01/2014   K 4.0 04/01/2014   CO2 23 04/01/2014   GLUCOSE 96 04/01/2014   BUN 21 04/01/2014   CREATININE 0.93 08/28/2014   BILITOT 0.4 12/06/2013   ALKPHOS 100 12/06/2013   AST 23 12/06/2013   ALT 35 12/06/2013   PROT 7.1 12/06/2013   ALBUMIN 3.9 12/06/2013   CALCIUM 9.2 04/01/2014   ANIONGAP 13 04/01/2014    Radiographic Findings: Mr [name] [name] Wo Contrast  09/18/2014   CLINICAL DATA:  49 year old male with current history of 4 cm non secretory pituitary adenoma status post trans-sphenoidal the pontine. Cushing syndrome. Under consideration for Stereotactic Radiosurgery to the residual pituitary tumor. Subsequent encounter.  EXAM: MRI HEAD WITHOUT AND WITH CONTRAST  TECHNIQUE: Multiplanar, multiecho pulse sequences of the brain and surrounding structures were obtained without and with intravenous contrast.  CONTRAST:  89mL MULTIHANCE GADOBENATE DIMEGLUMINE 529 MG/ML IV SOLN  COMPARISON:  Brain MRI 08/28/2014 and earlier.  FINDINGS: Stable cerebral volume. Major intracranial vascular flow voids are stable, including the ICA siphons.  No restricted diffusion to suggest acute infarction. No midline shift, mass effect, evidence of mass lesion, ventriculomegaly, extra-axial collection or acute intracranial hemorrhage. Cervicomedullary junction within normal limits. Negative visualized cervical spine.  Stable and largely unremarkable for age gray and white matter signal throughout the brain; mild nonspecific white matter T2 and FLAIR hyperintensity. No abnormal gray or white matter enhancement.  Visible internal auditory structures appear normal. Trace paranasal sinus mucosal thickening. Visualized orbit soft tissues are within normal limits. Trace mastoid fluid. Negative scalp soft tissues.  Postoperative changes to the sella turcica with residual heterogeneously enhancing adenoma which has infiltrated the clivus an the cavernous sinus, mostly in the midline and to the right. Mild  posterior bulging of the tumor into the pre pontine cistern demonstrated on series 10, image 46. Series 10 also demonstrates the lobulated residual tumor which engulfs the cavernous right ICA segment (see images 44 through 49). Sagittal post-contrast series 13 also demonstrate this finding. Overall the residual cavernous sinus component of tumor encompasses 24 x 20 x 21 mm (AP by transverse by CC). This is contiguous with the more central residual intra sellar/clivus component which encompasses 9 x 6 x 10 mm.  As before, no suprasellar mass effect. Mild downward mass effect on the optic chiasm and infundibulum.  IMPRESSION: 1. Residual infiltrative pituitary adenoma occupying the right cavernous sinus and floor/posterior wall of the sella and clivus. Tumor surrounds the cavernous segment of the right ICA which remains patent. There is mild posterior bulging of tumor into the pre  pontine cistern. See series 10. 2. No new intracranial abnormality.   Electronically Signed   By: Genevie Ann M.D.   On: 09/18/2014 22:11   Mr [name] [name] LA Contrast  08/28/2014   CLINICAL DATA:  Pituitary macroadenoma.  EXAM: MRI HEAD WITHOUT AND WITH CONTRAST  TECHNIQUE: Multiplanar, multiecho pulse sequences of the brain and surrounding structures were obtained without and with intravenous contrast.  CONTRAST:  10 mL MultiHance  COMPARISON:  MRI of the pituitary 05/15/2014  FINDINGS: Scattered periventricular and subcortical T2 hyperintensities are similar to the prior study. No acute infarct, parenchymal mass, or hemorrhage is present.  The study is moderately degraded by patient motion.  Dedicated imaging is of the pituitary demonstrate stable leftward deviation of the pituitary stalk. There is asymmetric soft tissue on the right with probable residual tumor into the right cavernous sinus. This is not significantly changed. The optic chiasm demonstrates slight downward deviation without change.  Flow is present in the major intracranial  arteries. The globes and orbits are intact. Mild mucosal thickening is present in the left maxillary sinus. The remaining paranasal sinuses and the mastoid air cells are clear.  IMPRESSION: 1. Stable postoperative changes of the sella with pituitary debulking. 2. Stable leftward deviation of the pituitary stalk in slight down or tenting of the optic chiasm. 3. Stable tumor along the right side of the sella and into the cavernous sinus. 4. Stable subcortical white matter changes.   Electronically Signed   By: San Morelle M.D.   On: 08/28/2014 13:39    Impression:  Pituitary adenoma would benefit with radiation. Proximity to right optic nerve will require fractionated radiation rather than SRS   Plan:  Radiation planning to simulate today. Planning 27 treatments to 52.2 Gy.   This document serves as a record of services personally performed by Tyler Pita, MD. It was created on his behalf by Arlyce Harman, a trained medical scribe. The creation of this record is based on the scribe's personal observations and the provider's statements to them. This document has been checked and approved by the attending provider.     _____________________________________  Sheral Apley. Tammi Klippel, M.D.

## 2014-09-25 NOTE — Progress Notes (Signed)
See progress noted under physician encounter.  

## 2014-09-25 NOTE — Progress Notes (Signed)
Joints continue to ache and patient continues to feel fatigued. Reports occasional nausea but, denies emesis. Reports occasional sinus headaches. Denies diplopia or ringing in the ears.

## 2014-10-02 DIAGNOSIS — D352 Benign neoplasm of pituitary gland: Secondary | ICD-10-CM | POA: Diagnosis not present

## 2014-10-06 ENCOUNTER — Ambulatory Visit
Admission: RE | Admit: 2014-10-06 | Discharge: 2014-10-06 | Disposition: A | Discharge status: Home / Self Care | Payer: BLUE CROSS/BLUE SHIELD | Source: Ambulatory Visit | Attending: Radiation Oncology | Admitting: Radiation Oncology

## 2014-10-06 DIAGNOSIS — D352 Benign neoplasm of pituitary gland: Secondary | ICD-10-CM | POA: Diagnosis not present

## 2014-10-06 NOTE — Progress Notes (Signed)
Daily weight ordered by Dr. Marcelyn Ditty. Weight obtained today 438.4 lb. Informed Dr. Marcelyn Ditty of this finding. Per Dr. Fonnie Jarvis order discussed this finding with Dr. Valere Dross. Dr. Valere Dross approved moving forward with treatment today. Patient two pounds shy of 440 table weight limit. Patient reports his PCP prescribed Lasix for edema in his feet. Patient reports he hasn't picked this script up yet. Advised he do so tonight and take as directed. Encouraged patient avoid any salt intake and reduce caloric intake. Patient verbalized understanding. Monitored pulse ox during treatment as directed by Dr. Valere Dross. Pulse ox remained between 98-100%. Patient understands if weight exceeds 440 treatment will be held. Patient will arrive 15 minutes prior to treatment appt tomorrow for weight. Patient determined to receive all 29 treatment. Patient understands this RN will do what it take to help him achieve this goal. Also, patient plans to bring transfer board from home to assist with transfer from motorized chair to treatment table.

## 2014-10-07 ENCOUNTER — Ambulatory Visit
Admission: RE | Admit: 2014-10-07 | Discharge: 2014-10-07 | Disposition: A | Discharge status: Home / Self Care | Payer: BLUE CROSS/BLUE SHIELD | Source: Ambulatory Visit | Attending: Radiation Oncology | Admitting: Radiation Oncology

## 2014-10-07 DIAGNOSIS — D352 Benign neoplasm of pituitary gland: Secondary | ICD-10-CM | POA: Diagnosis not present

## 2014-10-08 ENCOUNTER — Ambulatory Visit
Admission: RE | Admit: 2014-10-08 | Discharge: 2014-10-08 | Disposition: A | Discharge status: Home / Self Care | Payer: BLUE CROSS/BLUE SHIELD | Source: Ambulatory Visit | Attending: Radiation Oncology | Admitting: Radiation Oncology

## 2014-10-08 DIAGNOSIS — D352 Benign neoplasm of pituitary gland: Secondary | ICD-10-CM | POA: Diagnosis not present

## 2014-10-09 ENCOUNTER — Ambulatory Visit
Admission: RE | Admit: 2014-10-09 | Discharge: 2014-10-09 | Disposition: A | Discharge status: Home / Self Care | Payer: BLUE CROSS/BLUE SHIELD | Source: Ambulatory Visit | Attending: Radiation Oncology | Admitting: Radiation Oncology

## 2014-10-09 ENCOUNTER — Encounter: Payer: Self-pay | Admitting: Radiation Oncology

## 2014-10-09 VITALS — BP 156/100 | HR 95 | Temp 98.5°F | Resp 16 | Ht 73.0 in | Wt >= 6400 oz

## 2014-10-09 DIAGNOSIS — D352 Benign neoplasm of pituitary gland: Secondary | ICD-10-CM | POA: Diagnosis not present

## 2014-10-09 NOTE — Progress Notes (Signed)
  Radiation Oncology         [phone]) [phone] ________________________________  Name: Reg Bircher MRN: [id number]  Date: 10/09/2014  DOB: [birth year]     Weekly Radiation Therapy Management    ICD-9-CM ICD-10-CM   1. Pituitary adenoma 227.3 D35.2     Current Dose: 7.2 Gy     Planned Dose:  52.2 Gy  Narrative . . . . . . . . [name] [name] presents for routine under treatment assessment.                                  Devontre Tep has competed 4 fractions to his brain. He continues to have joint pain, especially his shoulders, hips and knees. He takes 1 norco at night. He reports having trouble sleeping. He reports feeling pressure behinds both of his eyes since radiation started. He reports having a headache on [name] second day but none since. He is not taking decadron                                  Set-up films were reviewed.                                 [name] chart was checked.  Physical Findings. . .  height is 6\' 1"  (1.854 m) and weight is 430 lb 1.9 oz (195.101 kg). His oral temperature is 98.5 F (36.9 C). His blood pressure is 156/100 and his pulse is 95. His respiration is 16 and oxygen saturation is 95%. . Weight essentially stable.  No significant changes.  Impression . . . . . . . [name] [name] is tolerating radiation.  Plan . . . . . . . . . . . . Continue treatment as planned.  This document serves as a record of services personally performed by Tyler Pita, MD. It was created on his behalf by Jeralene Peters, a trained medical scribe. [name] creation of this record is based on [name] scribe's personal observations and [name] provider's statements to them. This document has been checked and approved by [name] attending provider.       ________________________________  Sheral Apley. Tammi Klippel, M.D.

## 2014-10-09 NOTE — Progress Notes (Signed)
[name] [name] has competed 4 fractions to his brain. He continues to have joint pain, especially his shoulders, hips and knees.  He takes 1 norco at night.  He reports having trouble sleeping.  He reports feeling pressure behinds both of his eyes since radiation started.  He reports having a headache on the second day but none since.  He is not taking decadron.  BP 156/100 mmHg  Pulse 95  Temp(Src) 98.5 F (36.9 C) (Oral)  Resp 16  Ht 6\' 1"  (1.854 m)  Wt 430 lb 1.9 oz (195.101 kg)  BMI 56.76 kg/m2  SpO2 95%

## 2014-10-12 ENCOUNTER — Ambulatory Visit
Admission: RE | Admit: 2014-10-12 | Discharge: 2014-10-12 | Disposition: A | Discharge status: Home / Self Care | Payer: BLUE CROSS/BLUE SHIELD | Source: Ambulatory Visit | Attending: Radiation Oncology | Admitting: Radiation Oncology

## 2014-10-12 DIAGNOSIS — D352 Benign neoplasm of pituitary gland: Secondary | ICD-10-CM | POA: Diagnosis not present

## 2014-10-12 NOTE — Progress Notes (Signed)
Oriented patient to staff and routine of the clinic. Provided patient with RADIATION THERAPY AND YOU handbook then, reviewed pertinent information. Educated patient reference potential side effects and management such as fatigue, headache, skin changes, and hair loss. Patient verbalized understanding of all reviewed.

## 2014-10-12 NOTE — Addendum Note (Signed)
Encounter addended by: Heywood Footman, RN on: 10/12/2014  8:32 AM<BR>     Documentation filed: Notes Section, Chief Complaint Section

## 2014-10-13 ENCOUNTER — Ambulatory Visit
Admission: RE | Admit: 2014-10-13 | Discharge: 2014-10-13 | Disposition: A | Discharge status: Home / Self Care | Payer: BLUE CROSS/BLUE SHIELD | Source: Ambulatory Visit | Attending: Radiation Oncology | Admitting: Radiation Oncology

## 2014-10-13 DIAGNOSIS — D352 Benign neoplasm of pituitary gland: Secondary | ICD-10-CM | POA: Diagnosis not present

## 2014-10-14 ENCOUNTER — Ambulatory Visit
Admission: RE | Admit: 2014-10-14 | Discharge: 2014-10-14 | Disposition: A | Discharge status: Home / Self Care | Payer: BLUE CROSS/BLUE SHIELD | Source: Ambulatory Visit | Attending: Radiation Oncology | Admitting: Radiation Oncology

## 2014-10-14 DIAGNOSIS — D352 Benign neoplasm of pituitary gland: Secondary | ICD-10-CM | POA: Diagnosis not present

## 2014-10-15 ENCOUNTER — Ambulatory Visit
Admission: RE | Admit: 2014-10-15 | Discharge: 2014-10-15 | Disposition: A | Discharge status: Home / Self Care | Payer: BLUE CROSS/BLUE SHIELD | Source: Ambulatory Visit | Attending: Radiation Oncology | Admitting: Radiation Oncology

## 2014-10-15 DIAGNOSIS — D352 Benign neoplasm of pituitary gland: Secondary | ICD-10-CM | POA: Diagnosis not present

## 2014-10-16 ENCOUNTER — Encounter: Payer: Self-pay | Admitting: Radiation Oncology

## 2014-10-16 ENCOUNTER — Ambulatory Visit
Admission: RE | Admit: 2014-10-16 | Discharge: 2014-10-16 | Disposition: A | Discharge status: Home / Self Care | Payer: BLUE CROSS/BLUE SHIELD | Source: Ambulatory Visit | Attending: Radiation Oncology | Admitting: Radiation Oncology

## 2014-10-16 VITALS — BP 146/102 | HR 87 | Temp 98.5°F | Resp 18 | Wt >= 6400 oz

## 2014-10-16 DIAGNOSIS — D352 Benign neoplasm of pituitary gland: Secondary | ICD-10-CM | POA: Diagnosis not present

## 2014-10-16 NOTE — Progress Notes (Signed)
Reports that he has experienced one headache since starting radiation therapy. Reports feeling slightly nauseated today. Knows to take Zofran if nausea continues. Generalized fatigue. Difficulty sleeping continues. Weight stable. BP elevated. Patient plans to follow up with PCP reference elevated BP if this pattern continues.

## 2014-10-16 NOTE — Progress Notes (Signed)
Department of Radiation Oncology  Phone:  [phone] Fax:        [phone]  Weekly Treatment Note    Name: [name] [name] Date: 10/16/2014 MRN: [id number] DOB: [birth year]   Current dose: 16.2 Gy  Current fraction:9   MEDICATIONS: Current Outpatient Prescriptions  Medication Sig Dispense Refill  . albuterol (PROVENTIL HFA;VENTOLIN HFA) 108 (90 BASE) MCG/ACT inhaler Inhale 2 puffs into the lungs every 6 (six) hours as needed for wheezing or shortness of breath.    . allopurinol (ZYLOPRIM) 300 MG tablet Take 300 mg by mouth daily.    Marland Kitchen ALPRAZolam (XANAX) 0.25 MG tablet Take 1 tablet (0.25 mg total) by mouth every 6 (six) hours as needed for anxiety. 30 tablet 0  . buPROPion (WELLBUTRIN XL) 300 MG 24 hr tablet Take 300 mg by mouth daily.    . carboxymethylcellulose (REFRESH TEARS) 0.5 % SOLN Place 2 drops into both eyes daily as needed (for dry eyes).    . cyclobenzaprine (FLEXERIL) 10 MG tablet Take 10 mg by mouth daily as needed for muscle spasms.    . diclofenac sodium (VOLTAREN) 1 % GEL Apply 4 g topically daily as needed (pain).    . Diclofenac-Misoprostol (ARTHROTEC) 75-0.2 MG TBEC Take 1 tablet by mouth 2 (two) times daily.     . fluticasone (FLONASE) 50 MCG/ACT nasal spray Place 1 spray into both nostrils daily as needed for allergies or rhinitis.     Marland Kitchen gabapentin (NEURONTIN) 300 MG capsule Take 300 mg by mouth 3 (three) times daily.    Marland Kitchen HYDROcodone-acetaminophen (NORCO) 7.5-325 MG per tablet Take 1 tablet by mouth every 6 (six) hours as needed for moderate pain.    Marland Kitchen levothyroxine (SYNTHROID, LEVOTHROID) 75 MCG tablet Take 75 mcg by mouth daily before breakfast.    . lidocaine (XYLOCAINE) 2 % jelly     . LORazepam (ATIVAN) 1 MG tablet Take 1 tablet (1 mg total) by mouth every 8 (eight) hours. 30 tablet 0  . magnesium 30 MG tablet Take 30 mg by mouth 2 (two) times daily.    . Multiple Vitamin (MULTIVITAMIN) tablet Take 1 tablet by mouth daily. Centrum brand    .  Nutritional Supplements (JUVEN PO) Take 1 packet by mouth 2 (two) times daily. Wound healing    . ondansetron (ZOFRAN) 8 MG tablet Take 8 mg by mouth every 8 (eight) hours as needed for nausea.    . sertraline (ZOLOFT) 100 MG tablet Take 200 mg by mouth daily.    Marland Kitchen topiramate (TOPAMAX) 50 MG tablet Take 1 tablet (50 mg total) by mouth 2 (two) times daily. 60 tablet 1  . traMADol (ULTRAM) 50 MG tablet     . triamcinolone cream (KENALOG) 0.1 %     . vitamin B-12 (CYANOCOBALAMIN) 1000 MCG tablet Take 1,000 mcg by mouth daily.    . vitamin C (ASCORBIC ACID) 500 MG tablet Take 500 mg by mouth daily.    . Zinc 50 MG TABS Take 50 mg by mouth daily.     No current facility-administered medications for this encounter.     ALLERGIES: Morphine; Oxycodone; Penicillins; Buprenorphine hcl; and Morphine and related   LABORATORY DATA:  Lab Results  Component Value Date   WBC 6.4 04/01/2014   HGB 13.1 04/01/2014   HCT 41.6 04/01/2014   MCV 89.1 04/01/2014   PLT 157 04/01/2014   Lab Results  Component Value Date   NA 143 04/01/2014   K 4.0 04/01/2014   CL 107 04/01/2014  CO2 23 04/01/2014   Lab Results  Component Value Date   ALT 35 12/06/2013   AST 23 12/06/2013   ALKPHOS 100 12/06/2013   BILITOT 0.4 12/06/2013     NARRATIVE: Caster Sohm was seen today for weekly treatment management. The chart was checked and the patient's films were reviewed.  Reports that he has experienced one headache since starting radiation therapy. Reports feeling slightly nauseated today. Knows to take Zofran if nausea continues. Generalized fatigue. Difficulty sleeping continues. Weight stable. BP elevated. Patient plans to follow up with PCP reference elevated BP if this pattern continues.   PHYSICAL EXAMINATION: weight is 433 lb (196.408 kg). His oral temperature is 98.5 F (36.9 C). His blood pressure is 146/102 and his pulse is 87. His respiration is 18 and oxygen saturation is 95%.         ASSESSMENT: The patient is doing satisfactorily with treatment.  PLAN: We will continue with the patient's radiation treatment as planned.        This document serves as a record of services personally performed by Kyung Rudd, MD. It was created on his behalf by Derek Mound, a trained medical scribe. The creation of this record is based on the scribe's personal observations and the provider's statements to them. This document has been checked and approved by the attending provider.

## 2014-10-19 ENCOUNTER — Ambulatory Visit
Admission: RE | Admit: 2014-10-19 | Discharge: 2014-10-19 | Disposition: A | Discharge status: Home / Self Care | Payer: BLUE CROSS/BLUE SHIELD | Source: Ambulatory Visit | Attending: Radiation Oncology | Admitting: Radiation Oncology

## 2014-10-19 DIAGNOSIS — D352 Benign neoplasm of pituitary gland: Secondary | ICD-10-CM | POA: Diagnosis not present

## 2014-10-20 ENCOUNTER — Ambulatory Visit
Admission: RE | Admit: 2014-10-20 | Discharge: 2014-10-20 | Disposition: A | Discharge status: Home / Self Care | Payer: BLUE CROSS/BLUE SHIELD | Source: Ambulatory Visit | Attending: Radiation Oncology | Admitting: Radiation Oncology

## 2014-10-20 DIAGNOSIS — D352 Benign neoplasm of pituitary gland: Secondary | ICD-10-CM | POA: Diagnosis not present

## 2014-10-21 ENCOUNTER — Ambulatory Visit
Admission: RE | Admit: 2014-10-21 | Discharge: 2014-10-21 | Disposition: A | Discharge status: Home / Self Care | Payer: BLUE CROSS/BLUE SHIELD | Source: Ambulatory Visit | Attending: Radiation Oncology | Admitting: Radiation Oncology

## 2014-10-21 DIAGNOSIS — D352 Benign neoplasm of pituitary gland: Secondary | ICD-10-CM | POA: Diagnosis not present

## 2014-10-22 ENCOUNTER — Encounter: Payer: Self-pay | Admitting: Radiation Oncology

## 2014-10-22 ENCOUNTER — Ambulatory Visit
Admission: RE | Admit: 2014-10-22 | Discharge: 2014-10-22 | Disposition: A | Discharge status: Home / Self Care | Payer: BLUE CROSS/BLUE SHIELD | Source: Ambulatory Visit | Attending: Radiation Oncology | Admitting: Radiation Oncology

## 2014-10-22 VITALS — BP 156/106 | HR 93 | Resp 16 | Wt >= 6400 oz

## 2014-10-22 DIAGNOSIS — D352 Benign neoplasm of pituitary gland: Secondary | ICD-10-CM

## 2014-10-22 DIAGNOSIS — F40240 Claustrophobia: Secondary | ICD-10-CM

## 2014-10-22 DIAGNOSIS — F4323 Adjustment disorder with mixed anxiety and depressed mood: Secondary | ICD-10-CM

## 2014-10-22 MED ORDER — ALPRAZOLAM 0.25 MG PO TABS: 0.2500 mg | ORAL_TABLET | Freq: Three times a day (TID) | ORAL | Status: DC | PRN

## 2014-10-22 MED ORDER — ZOLPIDEM TARTRATE 10 MG PO TABS: 10.0000 mg | ORAL_TABLET | Freq: Every evening | ORAL | Status: DC | PRN

## 2014-10-22 NOTE — Progress Notes (Signed)
  Radiation Oncology         [phone]) [phone] ________________________________  Name: [name] [name] MRN: [id number]  Date: 10/22/2014  DOB: 10-04-1965    Weekly Radiation Therapy Management    ICD-9-CM ICD-10-CM   1. Pituitary adenoma 227.3 D35.2     Current Dose: 23.4 Gy     Planned Dose:  52.2 Gy  Narrative . . . . . . . . The patient presents for routine under treatment assessment.                                   Reports that he has experienced one headache since starting radiation therapy. Reports feeling slightly nauseated today. Knows to take Zofran if nausea continues. Generalized fatigue. Difficulty sleeping continues. Weight stable. BP elevated. Patient plans to follow up with PCP reference elevated BP if this pattern continues. The patient is without complaint.                                 Set-up films were reviewed.                                 The chart was checked. Physical Findings. . .  weight is 435 lb 1.9 oz (197.369 kg). His blood pressure is 156/106 and his pulse is 93. His respiration is 16 and oxygen saturation is 100%. . Weight essentially stable.  No significant changes. Impression . . . . . . . The patient is tolerating radiation. Plan . . . . . . . . . . . . Continue treatment as planned.  This document serves as a record of services personally performed by Tyler Pita, MD. It was created on his behalf by Arlyce Harman, a trained medical scribe. The creation of this record is based on the scribe's personal observations and the provider's statements to them. This document has been checked and approved by the attending provider.     ________________________________  Sheral Apley. Tammi Klippel, M.D.

## 2014-10-22 NOTE — Progress Notes (Addendum)
Weight stable. BP elevated. Denies headaches. Reports a few occasional where he has felt nauseated follow treatment but, this resolved on it's own without Zofran. Reports feeling dizziness today while getting on the treatment table. Denies alopecia. Reports difficulty sleeping continues.

## 2014-10-23 ENCOUNTER — Ambulatory Visit
Admission: RE | Admit: 2014-10-23 | Discharge: 2014-10-23 | Disposition: A | Discharge status: Home / Self Care | Payer: BLUE CROSS/BLUE SHIELD | Source: Ambulatory Visit | Attending: Radiation Oncology | Admitting: Radiation Oncology

## 2014-10-23 DIAGNOSIS — D352 Benign neoplasm of pituitary gland: Secondary | ICD-10-CM | POA: Diagnosis not present

## 2014-10-26 ENCOUNTER — Ambulatory Visit
Admission: RE | Admit: 2014-10-26 | Discharge: 2014-10-26 | Disposition: A | Discharge status: Home / Self Care | Payer: BLUE CROSS/BLUE SHIELD | Source: Ambulatory Visit | Attending: Radiation Oncology | Admitting: Radiation Oncology

## 2014-10-26 DIAGNOSIS — D352 Benign neoplasm of pituitary gland: Secondary | ICD-10-CM | POA: Diagnosis not present

## 2014-10-27 ENCOUNTER — Ambulatory Visit
Admission: RE | Admit: 2014-10-27 | Discharge: 2014-10-27 | Disposition: A | Discharge status: Home / Self Care | Payer: BLUE CROSS/BLUE SHIELD | Source: Ambulatory Visit | Attending: Radiation Oncology | Admitting: Radiation Oncology

## 2014-10-27 DIAGNOSIS — D352 Benign neoplasm of pituitary gland: Secondary | ICD-10-CM | POA: Diagnosis not present

## 2014-10-28 ENCOUNTER — Ambulatory Visit
Admission: RE | Admit: 2014-10-28 | Discharge: 2014-10-28 | Disposition: A | Discharge status: Home / Self Care | Payer: BLUE CROSS/BLUE SHIELD | Source: Ambulatory Visit | Attending: Radiation Oncology | Admitting: Radiation Oncology

## 2014-10-28 DIAGNOSIS — D352 Benign neoplasm of pituitary gland: Secondary | ICD-10-CM | POA: Diagnosis not present

## 2014-10-29 ENCOUNTER — Ambulatory Visit
Admission: RE | Admit: 2014-10-29 | Discharge: 2014-10-29 | Disposition: A | Discharge status: Home / Self Care | Payer: BLUE CROSS/BLUE SHIELD | Source: Ambulatory Visit | Attending: Radiation Oncology | Admitting: Radiation Oncology

## 2014-10-29 DIAGNOSIS — D352 Benign neoplasm of pituitary gland: Secondary | ICD-10-CM | POA: Diagnosis not present

## 2014-10-30 ENCOUNTER — Ambulatory Visit
Admission: RE | Admit: 2014-10-30 | Discharge: 2014-10-30 | Disposition: A | Discharge status: Home / Self Care | Payer: BLUE CROSS/BLUE SHIELD | Source: Ambulatory Visit | Attending: Radiation Oncology | Admitting: Radiation Oncology

## 2014-10-30 ENCOUNTER — Encounter: Payer: Self-pay | Admitting: Radiation Oncology

## 2014-10-30 VITALS — BP 146/108 | HR 96 | Resp 16 | Wt >= 6400 oz

## 2014-10-30 DIAGNOSIS — D352 Benign neoplasm of pituitary gland: Secondary | ICD-10-CM

## 2014-10-30 NOTE — Progress Notes (Signed)
Weight stable. Reports feeling uncomfortable and fatigued. Reports floaters in left eye still present. Encouraged to see eye doctor. BP high. Encouraged to follow up with endocrinologist because she stopped bp medication. Intermittent nausea continues. Patient continues to avoid salt. Also, patient began tapering Neurontin two weeks ago and weight fluctuations are less.   BP 146/108 mmHg  Pulse 96  Resp 16  Wt 433 lb 12.8 oz (196.77 kg)  SpO2 100% Wt Readings from Last 3 Encounters:  10/29/14 434 lb 12.8 oz (197.224 kg)  10/28/14 436 lb (197.768 kg)  10/27/14 435 lb 12.8 oz (197.678 kg)

## 2014-11-01 NOTE — Progress Notes (Signed)
  Radiation Oncology         [phone]) [phone] ________________________________  Name: [name] [name] MRN: [id number]  Date: 10/30/2014  DOB: August 02, 1965    Weekly Radiation Therapy Management    ICD-9-CM ICD-10-CM   1. Pituitary adenoma 227.3 D35.2     Current Dose: 34.2 Gy     Planned Dose:  52.2 Gy  Narrative: [name] [name] is a 49 year old gentleman presenting for routine under treatment assessment.   Weight stable. Reports feeling uncomfortable and fatigued. Reports floaters in left eye still present. Encouraged to see eye doctor. BP high. Encouraged to follow up with endocrinologist because she stopped bp medication. Intermittent nausea continues. Patient continues to avoid salt. Also, patient began tapering Neurontin two weeks ago and weight fluctuations are less.                    Set-up films were reviewed. The chart was checked. Physical Findings:   weight is 433 lb 12.8 oz (196.77 kg). His blood pressure is 146/108 and his pulse is 96. His respiration is 16 and oxygen saturation is 100%. . Weight essentially stable.  No significant changes. Impression: The patient is tolerating radiation. Plan:  Continue treatment as planned.  This document serves as a record of services personally performed by Tyler Pita, MD. It was created on his behalf by Lenn Cal, a trained medical scribe. The creation of this record is based on the scribe's personal observations and the provider's statements to them. This document has been checked and approved by the attending provider.       ________________________________  Sheral Apley. Tammi Klippel, M.D.

## 2014-11-03 ENCOUNTER — Ambulatory Visit
Admission: RE | Admit: 2014-11-03 | Discharge: 2014-11-03 | Disposition: A | Discharge status: Home / Self Care | Payer: BLUE CROSS/BLUE SHIELD | Source: Ambulatory Visit | Attending: Radiation Oncology | Admitting: Radiation Oncology

## 2014-11-03 DIAGNOSIS — D352 Benign neoplasm of pituitary gland: Secondary | ICD-10-CM | POA: Diagnosis not present

## 2014-11-04 ENCOUNTER — Ambulatory Visit
Admission: RE | Admit: 2014-11-04 | Discharge: 2014-11-04 | Disposition: A | Discharge status: Home / Self Care | Payer: BLUE CROSS/BLUE SHIELD | Source: Ambulatory Visit | Attending: Radiation Oncology | Admitting: Radiation Oncology

## 2014-11-04 DIAGNOSIS — D352 Benign neoplasm of pituitary gland: Secondary | ICD-10-CM | POA: Diagnosis not present

## 2014-11-05 ENCOUNTER — Ambulatory Visit
Admission: RE | Admit: 2014-11-05 | Discharge: 2014-11-05 | Disposition: A | Discharge status: Home / Self Care | Payer: BLUE CROSS/BLUE SHIELD | Source: Ambulatory Visit | Attending: Radiation Oncology | Admitting: Radiation Oncology

## 2014-11-05 DIAGNOSIS — D352 Benign neoplasm of pituitary gland: Secondary | ICD-10-CM | POA: Diagnosis not present

## 2014-11-06 ENCOUNTER — Encounter: Payer: Self-pay | Admitting: Radiation Oncology

## 2014-11-06 ENCOUNTER — Ambulatory Visit
Admission: RE | Admit: 2014-11-06 | Discharge: 2014-11-06 | Disposition: A | Discharge status: Home / Self Care | Payer: BLUE CROSS/BLUE SHIELD | Source: Ambulatory Visit | Attending: Radiation Oncology | Admitting: Radiation Oncology

## 2014-11-06 VITALS — BP 165/102 | HR 96 | Resp 18 | Wt >= 6400 oz

## 2014-11-06 DIAGNOSIS — D352 Benign neoplasm of pituitary gland: Secondary | ICD-10-CM

## 2014-11-06 NOTE — Progress Notes (Signed)
Weight and vitals stable. Denies pain. Reports collagen fiber behind retina is slight torn but, not yet detached. Dr. Kathrin Penner plans to monitor this over the next three months. Reports mild intermittent nausea worse at night. Reports headache today but, relates this to extensive eye exam performed yesterday. No alopecia.

## 2014-11-06 NOTE — Progress Notes (Signed)
  Radiation Oncology         [phone]) [phone] ________________________________  Name: [name] [name] MRN: [id number]  Date: 11/06/2014  DOB: [birth year]    Weekly Radiation Therapy Management    ICD-9-CM ICD-10-CM   1. Pituitary adenoma 227.3 D35.2     Current Dose: 41.4 Gy     Planned Dose:  52.2 Gy  Narrative: [name] [name] is a 49 year old gentleman presenting for routine under treatment assessment. Weight and vitals stable. Denies pain. Reports collagen fiber behind retina is slight torn but, not yet detached. The pt saw Dr. Kathrin Penner yesterday and plans to monitor this over the next three months. Reports mild intermittent nausea that is worse at night. Reports headache today but, relates this to extensive eye exam performed yesterday. No alopecia.                    Set-up films were reviewed. The chart was checked.  Physical Findings:   weight is 438 lb (198.675 kg). His blood pressure is 165/102 and his pulse is 96. His respiration is 18 and oxygen saturation is 100%. . Weight essentially stable.  No significant changes.  Impression: The patient is tolerating radiation.  Plan:  Continue treatment as planned.  This document serves as a record of services personally performed by Tyler Pita, MD. It was created on his behalf by Darcus Austin, a trained medical scribe. The creation of this record is based on the scribe's personal observations and the provider's statements to them. This document has been checked and approved by the attending provider.    ________________________________  Sheral Apley. Tammi Klippel, M.D.

## 2014-11-09 ENCOUNTER — Ambulatory Visit
Admission: RE | Admit: 2014-11-09 | Discharge: 2014-11-09 | Disposition: A | Discharge status: Home / Self Care | Payer: BLUE CROSS/BLUE SHIELD | Source: Ambulatory Visit | Attending: Radiation Oncology | Admitting: Radiation Oncology

## 2014-11-09 DIAGNOSIS — D352 Benign neoplasm of pituitary gland: Secondary | ICD-10-CM | POA: Diagnosis not present

## 2014-11-10 ENCOUNTER — Ambulatory Visit
Admission: RE | Admit: 2014-11-10 | Discharge: 2014-11-10 | Disposition: A | Discharge status: Home / Self Care | Payer: BLUE CROSS/BLUE SHIELD | Source: Ambulatory Visit | Attending: Radiation Oncology | Admitting: Radiation Oncology

## 2014-11-10 DIAGNOSIS — D352 Benign neoplasm of pituitary gland: Secondary | ICD-10-CM | POA: Diagnosis not present

## 2014-11-11 ENCOUNTER — Ambulatory Visit
Admission: RE | Admit: 2014-11-11 | Discharge: 2014-11-11 | Disposition: A | Discharge status: Home / Self Care | Payer: BLUE CROSS/BLUE SHIELD | Source: Ambulatory Visit | Attending: Radiation Oncology | Admitting: Radiation Oncology

## 2014-11-11 DIAGNOSIS — D352 Benign neoplasm of pituitary gland: Secondary | ICD-10-CM | POA: Diagnosis not present

## 2014-11-12 ENCOUNTER — Ambulatory Visit
Admission: RE | Admit: 2014-11-12 | Discharge: 2014-11-12 | Disposition: A | Discharge status: Home / Self Care | Payer: BLUE CROSS/BLUE SHIELD | Source: Ambulatory Visit | Attending: Radiation Oncology | Admitting: Radiation Oncology

## 2014-11-12 DIAGNOSIS — D352 Benign neoplasm of pituitary gland: Secondary | ICD-10-CM

## 2014-11-12 NOTE — Progress Notes (Signed)
Notified Nicole, RT on [facility] [address] of patient's weight of 440.4 lb today.

## 2014-11-12 NOTE — Progress Notes (Signed)
Patient crying refused vitals or assessment, was weighed upstairs =440.4lb, couldn't have treatment today, patient very upset,informed MD 11:44 AM'

## 2014-11-12 NOTE — Progress Notes (Signed)
  Radiation Oncology         [phone]   Name: [name] [name] MRN: [id number]   Date: 11/12/2014  DOB: 04/10/1966   Weekly Radiation Therapy Management    ICD-9-CM ICD-10-CM   1. Pituitary adenoma 227.3 D35.2     Current Dose: 46.8 Gy  Planned Dose:  52.2 Gy  Narrative The patient presents for routine under treatment assessment. Patient crying refused vitals or assessment, was weighed upstairs at 440.4lb, couldn't have treatment today, patient very upset,informed MD. The patient is without complaint.    Set-up films were reviewed.  The chart was checked.  Physical Findings Weight up a few pounds.  No significant changes.  Impression The patient is tolerating radiation.  Plan Continue treatment as planned.    This document serves as a record of services personally performed by Tyler Pita, MD. It was created on his behalf by Jeralene Peters, a trained medical scribe. The creation of this record is based on the scribe's personal observations and the provider's statements to them. This document has been checked and approved by the attending provider.       Sheral Apley Tammi Klippel, M.D.

## 2014-11-13 ENCOUNTER — Ambulatory Visit
Admission: RE | Admit: 2014-11-13 | Discharge: 2014-11-13 | Disposition: A | Discharge status: Home / Self Care | Payer: BLUE CROSS/BLUE SHIELD | Source: Ambulatory Visit | Attending: Radiation Oncology | Admitting: Radiation Oncology

## 2014-11-13 DIAGNOSIS — D352 Benign neoplasm of pituitary gland: Secondary | ICD-10-CM | POA: Diagnosis not present

## 2014-11-16 ENCOUNTER — Ambulatory Visit
Admission: RE | Admit: 2014-11-16 | Discharge: 2014-11-16 | Disposition: A | Discharge status: Home / Self Care | Payer: BLUE CROSS/BLUE SHIELD | Source: Ambulatory Visit | Attending: Radiation Oncology | Admitting: Radiation Oncology

## 2014-11-16 ENCOUNTER — Telehealth: Payer: Self-pay | Admitting: Radiation Oncology

## 2014-11-16 ENCOUNTER — Ambulatory Visit: Payer: BLUE CROSS/BLUE SHIELD

## 2014-11-16 DIAGNOSIS — D352 Benign neoplasm of pituitary gland: Secondary | ICD-10-CM | POA: Diagnosis not present

## 2014-11-16 NOTE — Telephone Encounter (Signed)
-----   Message from Tyler Pita, MD sent at 11/16/2014  3:07 PM EDT ----- Regarding: RE: Headaches Please refill. MM  ----- Message -----    From: Heywood Footman, RN    Sent: 11/16/2014   2:38 PM      To: Tyler Pita, MD Subject: Headaches                                      Dr. Tammi Klippel.  [name] [name] completes treatment tomorrow. He complained today he had an occipital headache most of the weekend and stayed in the bed all day Sunday as a result. The headache continued on into today. He reports he has taken Topiramate 50 mg twice a day prn in the past. He would need a refill if you approved the Topiramate.   Sam

## 2014-11-16 NOTE — Telephone Encounter (Signed)
Per Dr. Johny Shears order called in Topiramate 50 mg twice a day prn migraine/headache, qty 60, no refills. Spoke with Jenny Reichmann, pharmacist. Called patient making him aware this was done.

## 2014-11-17 ENCOUNTER — Ambulatory Visit
Admission: RE | Admit: 2014-11-17 | Discharge: 2014-11-17 | Disposition: A | Discharge status: Home / Self Care | Payer: BLUE CROSS/BLUE SHIELD | Source: Ambulatory Visit | Attending: Radiation Oncology | Admitting: Radiation Oncology

## 2014-11-17 ENCOUNTER — Encounter: Payer: Self-pay | Admitting: Radiation Oncology

## 2014-11-17 DIAGNOSIS — D352 Benign neoplasm of pituitary gland: Secondary | ICD-10-CM | POA: Diagnosis not present

## 2014-11-30 NOTE — Progress Notes (Signed)
  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     Plan: The patient has completed radiation treatment. The patient will return to radiation oncology clinic for routine followup in one month. I advised him to call or return sooner if he has any questions or concerns related to his recovery or treatment. ________________________________  Sheral Apley. Tammi Klippel, M.D.

## 2014-12-21 ENCOUNTER — Ambulatory Visit: Payer: BLUE CROSS/BLUE SHIELD | Admitting: Radiation Oncology

## 2014-12-30 ENCOUNTER — Encounter: Payer: Self-pay | Admitting: Radiation Oncology

## 2014-12-30 ENCOUNTER — Ambulatory Visit
Admission: RE | Admit: 2014-12-30 | Discharge: 2014-12-30 | Disposition: A | Discharge status: Home / Self Care | Payer: BLUE CROSS/BLUE SHIELD | Source: Ambulatory Visit | Attending: Radiation Oncology | Admitting: Radiation Oncology

## 2014-12-30 VITALS — BP 137/100 | HR 102 | Temp 97.9°F | Resp 20

## 2014-12-30 DIAGNOSIS — F4323 Adjustment disorder with mixed anxiety and depressed mood: Secondary | ICD-10-CM

## 2014-12-30 DIAGNOSIS — D352 Benign neoplasm of pituitary gland: Secondary | ICD-10-CM

## 2014-12-30 DIAGNOSIS — F40240 Claustrophobia: Secondary | ICD-10-CM

## 2014-12-30 MED ORDER — ALPRAZOLAM 0.25 MG PO TABS: 0.2500 mg | ORAL_TABLET | Freq: Three times a day (TID) | ORAL | Status: DC | PRN

## 2014-12-30 MED ORDER — ZOLPIDEM TARTRATE 10 MG PO TABS: 10.0000 mg | ORAL_TABLET | Freq: Every evening | ORAL | Status: DC | PRN

## 2014-12-30 NOTE — Progress Notes (Signed)
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 Pituitary adenoma 227.3 D35.2 ALPRAZolam (XANAX) 0.25 MG tablet     zolpidem (AMBIEN) 10 MG tablet  2. Adjustment disorder with mixed anxiety and depressed mood 309.28 F43.23 ALPRAZolam (XANAX) 0.25 MG tablet     zolpidem (AMBIEN) 10 MG tablet  3. Claustrophobia 300.29 F40.240 ALPRAZolam (XANAX) 0.25 MG tablet     zolpidem (AMBIEN) 10 MG tablet    Interval Since Last Radiation:  6  weeks  Narrative:  The patient returns today for routine follow-up. He rates his pain as a 5 on a scale of 0-10. constant, stabbing and aching over joints. Pt alert & oriented x 3 with fluent speech, reflexes normal and symmetric, in a motorized wheelchair. Pt reports floaters bilaterally more so right eye, PERRLA. Denies abnormal auditory distubances. Pt presenting appropriate quality, quantity and organization of sentences. Pt reports throbbing pain/headaches, bilateral in the occipital area- approximately 2 times a month. Pt complains of fatigue, weakness and loss of sleep.  Pt has been investigating chronic hand pain to find out if it is just arthritis or anything more, consulting with Dr. Trudie Reed. Recently saw his endocrinologist, Dr. Chalmers Cater, with blood work scheduled for late September.                               ALLERGIES:  is allergic to morphine; oxycodone; penicillins; buprenorphine hcl; and morphine and related.  Meds: Current Outpatient Prescriptions  Medication Sig Dispense Refill  . albuterol (PROVENTIL HFA;VENTOLIN HFA) 108 (90 BASE) MCG/ACT inhaler  Inhale 2 puffs into the lungs every 6 (six) hours as needed for wheezing or shortness of breath.    . allopurinol (ZYLOPRIM) 300 MG tablet Take 300 mg by mouth daily.    Marland Kitchen ALPRAZolam (XANAX) 0.25 MG tablet Take 1 tablet (0.25 mg total) by mouth 3 (three) times daily as needed for anxiety or sleep (and QHS prn insomnia). 60 tablet 0  . buPROPion (WELLBUTRIN XL) 300 MG 24 hr tablet Take 300 mg by mouth daily.    . carboxymethylcellulose (REFRESH TEARS) 0.5 % SOLN Place 2 drops into both eyes daily as needed (for dry eyes).    . cyclobenzaprine (FLEXERIL) 10 MG tablet Take 10 mg by mouth daily as needed for muscle spasms.    . diclofenac sodium (VOLTAREN) 1 % GEL Apply 4 g topically daily as needed (pain).    . Diclofenac-Misoprostol (ARTHROTEC) 75-0.2 MG TBEC Take 1 tablet by mouth 2 (two) times daily.     . fluticasone (FLONASE) 50 MCG/ACT nasal spray Place 1 spray into both nostrils daily as needed for allergies or rhinitis.     Marland Kitchen gabapentin (NEURONTIN) 300 MG capsule Take 300 mg by mouth 3 (three) times daily.    Marland Kitchen HYDROcodone-acetaminophen (NORCO) 7.5-325 MG per tablet Take 1 tablet by mouth every 6 (six) hours as needed for moderate pain.    Marland Kitchen levothyroxine (SYNTHROID, LEVOTHROID) 75 MCG tablet Take 75 mcg by mouth daily before breakfast.    . lidocaine (XYLOCAINE) 2 % jelly     .  LORazepam (ATIVAN) 1 MG tablet Take 1 tablet (1 mg total) by mouth every 8 (eight) hours. 30 tablet 0  . magnesium 30 MG tablet Take 30 mg by mouth 2 (two) times daily.    . Multiple Vitamin (MULTIVITAMIN) tablet Take 1 tablet by mouth daily. Centrum brand    . Nutritional Supplements (JUVEN PO) Take 1 packet by mouth 2 (two) times daily. Wound healing    . ondansetron (ZOFRAN) 8 MG tablet Take 8 mg by mouth every 8 (eight) hours as needed for nausea.    . sertraline (ZOLOFT) 100 MG tablet Take 200 mg by mouth daily.    Marland Kitchen topiramate (TOPAMAX) 50 MG tablet Take 1 tablet (50 mg total) by mouth 2 (two) times daily. 60  tablet 1  . traMADol (ULTRAM) 50 MG tablet     . triamcinolone cream (KENALOG) 0.1 %     . vitamin B-12 (CYANOCOBALAMIN) 1000 MCG tablet Take 1,000 mcg by mouth daily.    . vitamin C (ASCORBIC ACID) 500 MG tablet Take 500 mg by mouth daily.    . Zinc 50 MG TABS Take 50 mg by mouth daily.    Marland Kitchen zolpidem (AMBIEN) 10 MG tablet Take 1 tablet (10 mg total) by mouth at bedtime as needed for sleep. 30 tablet 0   No current facility-administered medications for this encounter.    Physical Findings: The patient is in no acute distress. Patient is alert and oriented.   oral temperature is 97.9 F (36.6 C). His blood pressure is 137/100 and his pulse is 102. His respiration is 20 and oxygen saturation is 94%. .  No significant changes.  Lab Findings: Lab Results  Component Value Date   WBC 6.4 04/01/2014   HGB 13.1 04/01/2014   HCT 41.6 04/01/2014   PLT 157 04/01/2014    Lab Results  Component Value Date   NA 143 04/01/2014   K 4.0 04/01/2014   CO2 23 04/01/2014   GLUCOSE 96 04/01/2014   BUN 21 04/01/2014   CREATININE 0.93 08/28/2014   BILITOT 0.4 12/06/2013   ALKPHOS 100 12/06/2013   AST 23 12/06/2013   ALT 35 12/06/2013   PROT 7.1 12/06/2013   ALBUMIN 3.9 12/06/2013   CALCIUM 9.2 04/01/2014   ANIONGAP 13 04/01/2014    Radiographic Findings: No results found.  Impression:  The patient is recovering from the effects of radiation.    Plan:  We will schedule for an MRI in September with a follow up after to discuss the results. Refilled the patient's xanax and ambien prescriptions.   This document serves as a record of services personally performed by Tyler Pita, MD. It was created on his behalf by Arlyce Harman, a trained medical scribe. The creation of this record is based on the scribe's personal observations and the provider's statements to them. This document has been checked and approved by the attending provider.    _____________________________________  Sheral Apley. Tammi Klippel, M.D.

## 2014-12-30 NOTE — Progress Notes (Signed)
PAIN: He rates his pain as a 5 on a scale of 0-10. constant, stabbing and aching over joints. NEURO: Pt alert & oriented x 3 with fluent speech, reflexes normal and symmetric, in a motorized wheelchair. Pt reports floaters bilaterally more so right eye,  PERRLA. Denies abnormal auditory distubances. Pt presenting appropriate quality, quantity and organization of sentences. Pt reports throbbing pain/headaches, bilateral in the occipital area- approximately 2 times a month. OTHER: Pt complains of fatigue, weakness and loss of sleep. BP 137/100 mmHg  Pulse 102  Temp(Src) 97.9 F (36.6 C) (Oral)  Resp 20  SpO2 94% Wt Readings from Last 3 Encounters:  11/17/14 438 lb (198.675 kg)  11/16/14 437 lb (198.222 kg)  11/13/14 434 lb (196.861 kg)

## 2015-03-02 ENCOUNTER — Encounter: Payer: Self-pay | Admitting: Radiation Therapy

## 2015-03-02 ENCOUNTER — Other Ambulatory Visit: Payer: Self-pay | Admitting: Radiation Therapy

## 2015-03-02 DIAGNOSIS — D352 Benign neoplasm of pituitary gland: Secondary | ICD-10-CM

## 2015-03-02 NOTE — Progress Notes (Signed)
1.  Do you need a wheel chair?    Pt has motorized wheelchair  2. On oxygen? no  3. Have you ever had any surgery in the body part being scanned? Yes- pituitary surgery July 2015  4. Have you ever had any surgery on your brain or heart?                 Brain -2015  5. Have you ever had surgery on your eyes or ears?      No                     6. Do you have a pacemaker or defibrillator?   No  7. Do you have a Neurostimulator?  No  8. Claustrophobic?  Yes- needs open scanner due to size  9. Any risk for metal in eyes?  no  10. Injury by bullet, buckshot, or shrapnel? no  11. Stent?  no                                                                                                                  12. Hx of Cancer? Benign   Pituitary Adenoma                                                                                                            13. Kidney or Liver disease?  Kidney   14. Hx of Lupus, Rheumatoid Arthritis or Scleroderma?  no  15. IV Antibiotics or long term use of NSAIDS?  no  16. HX of Hypertension?  yes  17. Diabetes?  no  18. Allergy to contrast?  no  19. Recent labs.  Needs stat labs drawn the date of scan- orders are in [facility] for a BUN and Creat draw.  Mont Dutton

## 2015-03-16 ENCOUNTER — Ambulatory Visit
Admission: RE | Admit: 2015-03-16 | Discharge: 2015-03-16 | Disposition: A | Discharge status: Home / Self Care | Payer: BLUE CROSS/BLUE SHIELD | Source: Ambulatory Visit | Attending: Radiation Oncology | Admitting: Radiation Oncology

## 2015-03-16 DIAGNOSIS — D352 Benign neoplasm of pituitary gland: Secondary | ICD-10-CM

## 2015-03-16 MED ORDER — GADOBENATE DIMEGLUMINE 529 MG/ML IV SOLN
10.0000 mL | Freq: Once | INTRAVENOUS | Status: AC | PRN
  Administered 2015-03-16: 10 mL via INTRAVENOUS

## 2015-03-22 ENCOUNTER — Ambulatory Visit
Admission: RE | Admit: 2015-03-22 | Discharge: 2015-03-22 | Disposition: A | Discharge status: Home / Self Care | Payer: BLUE CROSS/BLUE SHIELD | Source: Ambulatory Visit | Attending: Radiation Oncology | Admitting: Radiation Oncology

## 2015-03-22 ENCOUNTER — Encounter: Payer: Self-pay | Admitting: Radiation Oncology

## 2015-03-22 VITALS — BP 171/91 | HR 95 | Resp 16 | Wt >= 6400 oz

## 2015-03-22 DIAGNOSIS — F40240 Claustrophobia: Secondary | ICD-10-CM

## 2015-03-22 DIAGNOSIS — G4700 Insomnia, unspecified: Secondary | ICD-10-CM

## 2015-03-22 DIAGNOSIS — F4323 Adjustment disorder with mixed anxiety and depressed mood: Secondary | ICD-10-CM

## 2015-03-22 DIAGNOSIS — D352 Benign neoplasm of pituitary gland: Secondary | ICD-10-CM

## 2015-03-22 MED ORDER — ZOLPIDEM TARTRATE 10 MG PO TABS: 10.0000 mg | ORAL_TABLET | Freq: Every evening | ORAL | Status: DC | PRN

## 2015-03-22 MED ORDER — ALPRAZOLAM 0.25 MG PO TABS: 0.2500 mg | ORAL_TABLET | Freq: Three times a day (TID) | ORAL | Status: DC | PRN

## 2015-03-22 NOTE — Progress Notes (Addendum)
BP elevated. Patient reports taking a Sudafed last night for congestion. 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 No alopecia noted. Reports he continues to have difficulty sleeping. Reports joint pain continues.  Reports headaches continue. Denies nausea, vomiting or dizziness. Reports floater in left eye continues.   BP 171/91 mmHg  Pulse 95  Resp 16  Wt 438 lb (198.675 kg)  SpO2 100% Wt Readings from Last 3 Encounters:  03/22/15 438 lb (198.675 kg)  03/16/15 438 lb (198.675 kg)  11/17/14 438 lb (198.675 kg)

## 2015-03-22 NOTE — Progress Notes (Signed)
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 Pituitary adenoma ([place]) 227.3 D35.2 ALPRAZolam (XANAX) 0.25 MG tablet     zolpidem (AMBIEN) 10 MG tablet  2. Adjustment disorder with mixed anxiety and depressed mood 309.28 F43.23 ALPRAZolam (XANAX) 0.25 MG tablet     zolpidem (AMBIEN) 10 MG tablet  3. Claustrophobia 300.29 F40.240 ALPRAZolam (XANAX) 0.25 MG tablet     zolpidem (AMBIEN) 10 MG tablet  4. Insomnia 780.52 G47.00 ALPRAZolam (XANAX) 0.25 MG tablet     zolpidem (AMBIEN) 10 MG tablet    Interval Since Last Radiation:  4 months  Narrative:  The patient returns today for routine follow-up. Patient reports taking a Sudafed last night for congestion. 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 His ACTH level remains high. Reports he continues to have difficulty sleeping, continuing joint pain, and headaches. He reports using Ambien when he has difficulty sleeping. Denies nausea, vomiting or dizziness. Reports floater in left eye continues, but is stable. He needs refills for Ambien and Xanax.  ALLERGIES:  is allergic to morphine; oxycodone; penicillins; buprenorphine hcl; and morphine and related.  Meds: Current Outpatient Prescriptions  Medication Sig Dispense Refill  . albuterol (PROVENTIL HFA;VENTOLIN HFA) 108 (90 BASE) MCG/ACT inhaler Inhale 2 puffs into the lungs every 6 (six) hours as needed for wheezing or shortness of breath.    . allopurinol (ZYLOPRIM) 300 MG tablet Take 300 mg by mouth daily.    Marland Kitchen  ALPRAZolam (XANAX) 0.25 MG tablet Take 1 tablet (0.25 mg total) by mouth 3 (three) times daily as needed for anxiety or sleep (and QHS prn insomnia). 60 tablet 0  . buPROPion (WELLBUTRIN XL) 300 MG 24 hr tablet Take 300 mg by mouth daily.    . carboxymethylcellulose (REFRESH TEARS) 0.5 % SOLN Place 2 drops into both eyes daily as needed (for dry eyes).    . cyclobenzaprine (FLEXERIL) 10 MG tablet Take 10 mg by mouth daily as needed for muscle spasms.    . diclofenac sodium (VOLTAREN) 1 % GEL Apply 4 g topically daily as needed (pain).    . Diclofenac-Misoprostol (ARTHROTEC) 75-0.2 MG TBEC Take 1 tablet by mouth 2 (two) times daily.     . fluticasone (FLONASE) 50 MCG/ACT nasal spray Place 1 spray into both nostrils daily as needed for allergies or rhinitis.     Marland Kitchen gabapentin (NEURONTIN) 300 MG capsule Take 300 mg by mouth 3 (three) times daily.    Marland Kitchen HYDROcodone-acetaminophen (NORCO) 7.5-325 MG per tablet Take 1 tablet by mouth every 6 (six) hours as needed for moderate pain.    Marland Kitchen levothyroxine (SYNTHROID, LEVOTHROID) 75 MCG tablet Take 75 mcg by mouth daily before breakfast.    . lidocaine (XYLOCAINE) 2 % jelly     . LORazepam (ATIVAN) 1 MG tablet Take 1 tablet (1 mg total) by mouth every 8 (eight) hours. 30 tablet 0  . magnesium 30 MG tablet Take 30 mg by mouth 2 (two) times daily.    . Multiple Vitamin (MULTIVITAMIN) tablet Take 1 tablet by mouth  daily. Centrum brand    . Nutritional Supplements (JUVEN PO) Take 1 packet by mouth 2 (two) times daily. Wound healing    . ondansetron (ZOFRAN) 8 MG tablet Take 8 mg by mouth every 8 (eight) hours as needed for nausea.    . sertraline (ZOLOFT) 100 MG tablet Take 200 mg by mouth daily.    Marland Kitchen topiramate (TOPAMAX) 50 MG tablet Take 1 tablet (50 mg total) by mouth 2 (two) times daily. 60 tablet 1  . traMADol (ULTRAM) 50 MG tablet     . triamcinolone cream (KENALOG) 0.1 %     . vitamin B-12 (CYANOCOBALAMIN) 1000 MCG tablet Take 1,000 mcg by mouth daily.    .  vitamin C (ASCORBIC ACID) 500 MG tablet Take 500 mg by mouth daily.    . Zinc 50 MG TABS Take 50 mg by mouth daily.    Marland Kitchen zolpidem (AMBIEN) 10 MG tablet Take 1 tablet (10 mg total) by mouth at bedtime as needed for sleep. 30 tablet 0  . zolpidem (AMBIEN) 10 MG tablet Take 1 tablet (10 mg total) by mouth at bedtime as needed for sleep. 30 tablet 0   No current facility-administered medications for this encounter.    Physical Findings: The patient is in no acute distress. Patient is alert and oriented.   weight is 438 lb (198.675 kg). His blood pressure is 171/91 and his pulse is 95. His respiration is 16 and oxygen saturation is 100%.   The patient presents to the clinic in a motorized wheelchair. No significant changes.  Lab Findings: Lab Results  Component Value Date   WBC 6.4 04/01/2014   HGB 13.1 04/01/2014   HCT 41.6 04/01/2014   PLT 157 04/01/2014    Lab Results  Component Value Date   NA 143 04/01/2014   K 4.0 04/01/2014   CO2 23 04/01/2014   GLUCOSE 96 04/01/2014   BUN 21 04/01/2014   CREATININE 0.93 08/28/2014   BILITOT 0.4 12/06/2013   ALKPHOS 100 12/06/2013   AST 23 12/06/2013   ALT 35 12/06/2013   PROT 7.1 12/06/2013   ALBUMIN 3.9 12/06/2013   CALCIUM 9.2 04/01/2014   ANIONGAP 13 04/01/2014    Radiographic Findings: Mr [name] [name] OY Contrast  03/16/2015  CLINICAL DATA:  Pituitary adenoma status post transsphenoidal debulking with radiotherapy made to June 2016. EXAM: MRI HEAD WITHOUT AND WITH CONTRAST TECHNIQUE: Multiplanar, multiecho pulse sequences of the brain and surrounding structures were obtained without and with intravenous contrast. Creatinine was obtained on site at [facility] at [address] Results: Creatinine 1 mg/dL. CONTRAST:  10 cc MultiHance intravenous COMPARISON:  None. 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 Orbits: No significant findings. Sinuses and  Mastoids: No visible complication of trans-sphenoidal resection. Brain: No appreciable change in the enhancing residual pituitary adenoma, mainly present in the right cavernous sinus and sellar floor/dorsum sella. There is chronic encasement of the right cavernous carotid without narrowing. The right cavernous sinus is filled and distended, stable. 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 Stable displacement of the infundibulum to the left. No tethering or mass effect on the chiasm which has normal bulk. Residual dimensions are similar to previous, with maximal 22 diameter width. Intraosseous portion has stable bowing into the pre pontine cistern without pons contact. Chronic small-vessel disease with patchy ischemic gliosis in the  pons and bilateral cerebral white matter. No acute infarct, hemorrhage, hydrocephalus, or major vessel occlusion. IMPRESSION: Stable residual pituitary adenoma in the skullbase and right cavernous sinus, as above. Electronically Signed   By: Monte Fantasia M.D.   On: 03/16/2015 14:58   Impression:  The patient is recovering from the effects of radiation.  Plan:  We will schedule for an MRI in 6 months with a follow up after to discuss the results. I have refilled his Ambien and Xanax.  This document serves as a record of services personally performed by Tyler Pita, MD. It was created on his behalf by Darcus Austin, a trained medical scribe. The creation of this record is based on the scribe's personal observations and the provider's statements to them. This document has been checked and approved by the attending provider. _____________________________________  Sheral Apley. Tammi Klippel, M.D.

## 2015-03-26 ENCOUNTER — Telehealth: Payer: Self-pay | Admitting: Radiation Oncology

## 2015-03-26 NOTE — Telephone Encounter (Signed)
Patient phoned [name] [name] and reports his scripts for Ambien and Xanax have been lost. Called in Xanax .25 mg and Ambien 10 mg to [facility] [place] as prescribed by Dr. Tammi Klippel on 03/22/15. Phoned patient making him aware this was done and the scripts should be ready for pick up in the hour.

## 2015-04-06 ENCOUNTER — Other Ambulatory Visit: Payer: Self-pay | Admitting: Physical Medicine and Rehabilitation

## 2015-05-07 ENCOUNTER — Other Ambulatory Visit: Payer: Self-pay | Admitting: Radiation Oncology

## 2015-05-07 ENCOUNTER — Telehealth: Payer: Self-pay | Admitting: Radiation Oncology

## 2015-05-07 DIAGNOSIS — F40240 Claustrophobia: Secondary | ICD-10-CM

## 2015-05-07 DIAGNOSIS — F4323 Adjustment disorder with mixed anxiety and depressed mood: Secondary | ICD-10-CM

## 2015-05-07 DIAGNOSIS — D352 Benign neoplasm of pituitary gland: Secondary | ICD-10-CM

## 2015-05-07 MED ORDER — ZOLPIDEM TARTRATE 10 MG PO TABS: 10.0000 mg | ORAL_TABLET | Freq: Every evening | ORAL | Status: DC | PRN

## 2015-05-07 NOTE — Telephone Encounter (Signed)
Per Dr. Johny Shears order called in Ambien 10 mg tablet, one tablet by mouth at bedtime prn sleep, qty 30, and no refills to [facility] [place]. Phoned patient making him aware this was done. Patient verbalized understanding and expressed appreciation for the call.

## 2015-07-07 ENCOUNTER — Telehealth: Payer: Self-pay | Admitting: *Deleted

## 2015-07-07 NOTE — Telephone Encounter (Signed)
On 07-07-15 fax medical records to release point it was consult note, follow up note, sim and planning note, end of tx note,

## 2015-07-10 ENCOUNTER — Other Ambulatory Visit: Payer: Self-pay | Admitting: Radiation Oncology

## 2015-07-12 ENCOUNTER — Telehealth: Payer: Self-pay | Admitting: Radiation Oncology

## 2015-07-12 NOTE — Telephone Encounter (Signed)
Per Dr. Johny Shears order called in Ambien 10 mg, one tablet by mouth as needed for sleep, qty 30, and [facility]. Phoned patient informing him this was done. Patient verbalized understanding and expressed appreciation for the call.

## 2015-08-27 ENCOUNTER — Other Ambulatory Visit: Payer: Self-pay | Admitting: *Deleted

## 2015-08-27 DIAGNOSIS — D352 Benign neoplasm of pituitary gland: Secondary | ICD-10-CM

## 2015-08-31 ENCOUNTER — Other Ambulatory Visit: Payer: Self-pay | Admitting: Radiation Oncology

## 2015-08-31 MED ORDER — LORAZEPAM 0.5 MG PO TABS: ORAL_TABLET | ORAL | Status: DC

## 2015-09-01 ENCOUNTER — Telehealth: Payer: Self-pay | Admitting: Radiation Oncology

## 2015-09-01 ENCOUNTER — Telehealth: Payer: Self-pay | Admitting: Radiation Therapy

## 2015-09-01 NOTE — Telephone Encounter (Signed)
Called to let [name] know that the Rx for Ativan has been called into his pharmacy. [facility]  I also instructed him to not take his Xanax within 6 hours of taking the Ativan. He did not answer so, these instructions were left as a voicemail on the [phone] line.   [name] [name]

## 2015-09-01 NOTE — Telephone Encounter (Signed)
Called in [place] per Shona Simpson, PA-C order. Then, phoned patient and left a message this was done.

## 2015-09-16 ENCOUNTER — Other Ambulatory Visit: Payer: Self-pay | Admitting: Radiation Oncology

## 2015-09-16 MED ORDER — ZOLPIDEM TARTRATE 10 MG PO TABS: 10.0000 mg | ORAL_TABLET | Freq: Every evening | ORAL | Status: DC | PRN

## 2015-09-30 ENCOUNTER — Ambulatory Visit
Admission: RE | Admit: 2015-09-30 | Discharge: 2015-09-30 | Disposition: A | Discharge status: Home / Self Care | Payer: BLUE CROSS/BLUE SHIELD | Source: Ambulatory Visit | Attending: Radiation Oncology | Admitting: Radiation Oncology

## 2015-09-30 DIAGNOSIS — D352 Benign neoplasm of pituitary gland: Secondary | ICD-10-CM

## 2015-09-30 MED ORDER — GADOBENATE DIMEGLUMINE 529 MG/ML IV SOLN
15.0000 mL | Freq: Once | INTRAVENOUS | Status: AC | PRN
  Administered 2015-09-30: 15 mL via INTRAVENOUS

## 2015-10-04 ENCOUNTER — Ambulatory Visit: Payer: Self-pay | Admitting: Radiation Oncology

## 2015-10-05 ENCOUNTER — Ambulatory Visit: Payer: Self-pay | Admitting: Radiation Oncology

## 2015-10-07 ENCOUNTER — Ambulatory Visit
Admission: RE | Admit: 2015-10-07 | Discharge: 2015-10-07 | Disposition: A | Discharge status: Home / Self Care | Payer: BLUE CROSS/BLUE SHIELD | Source: Ambulatory Visit | Attending: Radiation Oncology | Admitting: Radiation Oncology

## 2015-10-07 VITALS — BP 121/81 | HR 89 | Temp 98.3°F | Resp 16

## 2015-10-07 DIAGNOSIS — D352 Benign neoplasm of pituitary gland: Secondary | ICD-10-CM

## 2015-10-07 NOTE — Progress Notes (Signed)
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 Only occasional days where she has low energy. He rates his pain as a 6 on a scale of 0-10. constant and dull over joint pain.Pt alert & oriented x 3 with fluent speech, weakness to right lower side. Pt denies visual or auditory disturbances, PERRLA. Pt presenting appropriate quality, quantity and organization of sentences. Pt reports dull pain frontal once a month. Pt complains of fatigue, weakness and loss of sleep takes Ambien.  On 09/30/15 brain MRI indicates a slight decrease in size of residual tumor within the right cavernous sinus/ sella region   ALLERGIES:  is allergic to morphine; oxycodone; penicillins; buprenorphine hcl; and morphine and related.  Meds: Current Outpatient Prescriptions  Medication Sig Dispense Refill  . albuterol (PROVENTIL HFA;VENTOLIN HFA) 108 (90 BASE) MCG/ACT inhaler Inhale 2 puffs into the lungs every 6 (six) hours as needed for wheezing or shortness of breath.    . allopurinol (ZYLOPRIM) 300 MG tablet Take 300 mg by mouth daily.    Marland Kitchen ALPRAZolam (XANAX) 0.25 MG tablet Take 1 tablet (0.25 mg total) by mouth 3 (three) times daily as needed for anxiety or sleep (and QHS prn insomnia). 60 tablet 0  . buPROPion (WELLBUTRIN XL) 300 MG 24 hr tablet Take 300 mg by mouth daily.    . carboxymethylcellulose (REFRESH TEARS) 0.5 % SOLN Place 2 drops into both eyes daily as needed (for dry eyes).    . cyclobenzaprine (FLEXERIL) 10 MG tablet Take 10 mg by mouth  daily as needed for muscle spasms.    . diclofenac sodium (VOLTAREN) 1 % GEL Apply 4 g topically daily as needed (pain).    . Diclofenac-Misoprostol (ARTHROTEC) 75-0.2 MG TBEC Take 1 tablet by mouth 2 (two) times daily.     . fluticasone (FLONASE) 50 MCG/ACT nasal spray Place 1 spray into both nostrils daily as needed for allergies or rhinitis.     Marland Kitchen gabapentin (NEURONTIN) 300 MG capsule Take 300 mg by mouth 3 (three) times daily.    Marland Kitchen HYDROcodone-acetaminophen (NORCO) 7.5-325 MG per tablet Take 1 tablet by mouth every 6 (six) hours as needed for moderate pain.    Marland Kitchen levothyroxine (SYNTHROID, LEVOTHROID) 75 MCG tablet Take 75 mcg by mouth daily before breakfast.    . lidocaine (XYLOCAINE) 2 % jelly     . LORazepam (ATIVAN) 0.5 MG tablet Take one tab po 30 minutes prior to MRI scans 10 tablet 0  . magnesium 30 MG tablet Take 30 mg by mouth 2 (two) times daily.    . Multiple Vitamin (MULTIVITAMIN) tablet Take 1 tablet by mouth daily. Centrum brand    . Nutritional Supplements (JUVEN PO) Take 1 packet by mouth 2 (two) times daily. Wound healing    . ondansetron (ZOFRAN) 8 MG tablet Take 8 mg by mouth every 8 (eight) hours as needed for nausea.    . sertraline (ZOLOFT) 100 MG tablet Take 200 mg by mouth daily.    Marland Kitchen topiramate (TOPAMAX) 50 MG tablet Take 1 tablet (50 mg total) by  mouth 2 (two) times daily. 60 tablet 1  . traMADol (ULTRAM) 50 MG tablet     . triamcinolone cream (KENALOG) 0.1 %     . vitamin B-12 (CYANOCOBALAMIN) 1000 MCG tablet Take 1,000 mcg by mouth daily.    . vitamin C (ASCORBIC ACID) 500 MG tablet Take 500 mg by mouth daily.    . Zinc 50 MG TABS Take 50 mg by mouth daily.    Marland Kitchen zolpidem (AMBIEN) 10 MG tablet Take 1 tablet (10 mg total) by mouth at bedtime as needed for sleep. 30 tablet 5   No current facility-administered medications for this encounter.    Physical Findings: The patient is in no acute distress. Patient is alert and oriented.   oral temperature is 98.3 F  (36.8 C). His blood pressure is 121/81 and his pulse is 89. His respiration is 16 and oxygen saturation is 91%.   The patient presents to the clinic in a motorized wheelchair. No significant changes.  Lab Findings: Lab Results  Component Value Date   WBC 6.4 04/01/2014   HGB 13.1 04/01/2014   HCT 41.6 04/01/2014   PLT 157 04/01/2014    Lab Results  Component Value Date   NA 143 04/01/2014   K 4.0 04/01/2014   CO2 23 04/01/2014   GLUCOSE 96 04/01/2014   BUN 21 04/01/2014   CREATININE 0.93 08/28/2014   BILITOT 0.4 12/06/2013   ALKPHOS 100 12/06/2013   AST 23 12/06/2013   ALT 35 12/06/2013   PROT 7.1 12/06/2013   ALBUMIN 3.9 12/06/2013   CALCIUM 9.2 04/01/2014   ANIONGAP 13 04/01/2014    Radiographic Findings: Mr [name] [name] [id number] Contrast  09/30/2015  CLINICAL DATA:  50 year old male with history of pituitary tumor (pituitary adenoma) 2 years ago treated with surgery 12/07/2013 and radiation therapy 1 year ago. Sixteen month stereotactic radio surgery restaging exam. Subsequent encounter. Labs obtained revealed BUN of 34 and creatinine of 1.3.  GFR 58. EXAM: MRI HEAD WITHOUT AND WITH CONTRAST TECHNIQUE: Multiplanar, multiecho pulse sequences of the brain and surrounding structures were obtained without and with intravenous contrast. CONTRAST:  103mL MULTIHANCE GADOBENATE DIMEGLUMINE 529 MG/ML IV SOLN COMPARISON:  03/16/2015 MR. FINDINGS: Post surgical debulking of large pituitary mass. Residual tumor most notable within the right aspect the sella and right cavernous sinus region appears slightly less prominent than on prior examination. Present overall maximal dimensions of 1.9 x 1.7 x 2.8 cm versus prior 2 x 2.3 x 2.9 cm. Residual tumor surrounds but does not cause narrowing of the right internal carotid artery which is moderately ectatic ectatic. Tumor displaces the right gasserian ganglion. No acute infarct or intracranial hemorrhage. Nonspecific white matter changes most notable frontal  lobes may reflect result of small vessel disease and/ treatment of tumor. Other causes of white matter changes felt less likely. Mild global atrophy without hydrocephalus. Exophthalmos without change. IMPRESSION: Slight decrease in size of residual tumor within the right cavernous sinus/ sella region as detailed above. The right internal carotid artery is asymmetrically ectatic when compared to the left. Attention to this on follow-up. Nonspecific white matter changes once again noted. Electronically Signed   By: Genia Del M.D.   On: 09/30/2015 18:01   Impression:  The patient is recovering from the effects of radiation. Brain MRI shows no evidence of recurrence.   Plan:  Follow up with repeat brain MRI in 6 months.   _____________________________________  Sheral Apley. Tammi Klippel, M.D.  This document serves as a record of  services personally performed by Tyler Pita, MD. It was created on his behalf by Derek Mound, a trained medical scribe. The creation of this record is based on the scribe's personal observations and the provider's statements to them. This document has been checked and approved by the attending provider.

## 2015-10-07 NOTE — Progress Notes (Signed)
PAIN: He rates his pain as a 6 on a scale of 0-10. constant and dull over joint pain. NEURO: Pt alert & oriented x 3 with fluent speech, weakness to right lower side. Pt denies visual or auditory disturbances, PERRLA. Pt presenting appropriate quality, quantity and organization of sentences. Pt reports dull pain frontal once a month.  OTHER: Pt complains of fatigue, weakness and loss of sleep takes Ambien.  BP 121/81 mmHg  Pulse 89  Temp(Src) 98.3 F (36.8 C) (Oral)  Resp 16  SpO2 91% Wt Readings from Last 3 Encounters:  09/30/15 438 lb (198.675 kg)  03/22/15 438 lb (198.675 kg)  03/16/15 438 lb (198.675 kg)  MRI: 09/30/15

## 2015-11-03 ENCOUNTER — Other Ambulatory Visit: Payer: Self-pay | Admitting: Radiation Oncology

## 2015-11-03 DIAGNOSIS — D352 Benign neoplasm of pituitary gland: Secondary | ICD-10-CM

## 2015-11-03 DIAGNOSIS — G4700 Insomnia, unspecified: Secondary | ICD-10-CM

## 2015-11-03 DIAGNOSIS — F40240 Claustrophobia: Secondary | ICD-10-CM

## 2015-11-03 DIAGNOSIS — F4323 Adjustment disorder with mixed anxiety and depressed mood: Secondary | ICD-10-CM

## 2015-11-03 MED ORDER — ALPRAZOLAM 0.25 MG PO TABS: 0.2500 mg | ORAL_TABLET | Freq: Three times a day (TID) | ORAL | Status: AC | PRN

## 2015-11-17 DIAGNOSIS — E248 Other Cushing's syndrome: Secondary | ICD-10-CM | POA: Diagnosis not present

## 2015-11-17 DIAGNOSIS — R5383 Other fatigue: Secondary | ICD-10-CM | POA: Diagnosis not present

## 2015-11-17 DIAGNOSIS — Z23 Encounter for immunization: Secondary | ICD-10-CM | POA: Diagnosis not present

## 2015-11-17 DIAGNOSIS — I10 Essential (primary) hypertension: Secondary | ICD-10-CM | POA: Diagnosis not present

## 2015-11-17 DIAGNOSIS — Z0000 Encounter for general adult medical examination without abnormal findings: Secondary | ICD-10-CM | POA: Diagnosis not present

## 2015-11-17 DIAGNOSIS — Z125 Encounter for screening for malignant neoplasm of prostate: Secondary | ICD-10-CM | POA: Diagnosis not present

## 2015-11-17 DIAGNOSIS — M791 Myalgia: Secondary | ICD-10-CM | POA: Diagnosis not present

## 2015-12-09 DIAGNOSIS — E248 Other Cushing's syndrome: Secondary | ICD-10-CM | POA: Diagnosis not present

## 2015-12-09 DIAGNOSIS — E230 Hypopituitarism: Secondary | ICD-10-CM | POA: Diagnosis not present

## 2015-12-09 DIAGNOSIS — D497 Neoplasm of unspecified behavior of endocrine glands and other parts of nervous system: Secondary | ICD-10-CM | POA: Diagnosis not present

## 2015-12-15 DIAGNOSIS — E038 Other specified hypothyroidism: Secondary | ICD-10-CM | POA: Diagnosis not present

## 2015-12-15 DIAGNOSIS — D497 Neoplasm of unspecified behavior of endocrine glands and other parts of nervous system: Secondary | ICD-10-CM | POA: Diagnosis not present

## 2015-12-15 DIAGNOSIS — E249 Cushing's syndrome, unspecified: Secondary | ICD-10-CM | POA: Diagnosis not present

## 2015-12-15 DIAGNOSIS — I10 Essential (primary) hypertension: Secondary | ICD-10-CM | POA: Diagnosis not present

## 2016-02-29 DIAGNOSIS — M791 Myalgia: Secondary | ICD-10-CM | POA: Diagnosis not present

## 2016-03-24 ENCOUNTER — Other Ambulatory Visit: Payer: Self-pay | Admitting: Radiation Therapy

## 2016-03-24 DIAGNOSIS — D352 Benign neoplasm of pituitary gland: Secondary | ICD-10-CM

## 2016-04-10 DIAGNOSIS — Z09 Encounter for follow-up examination after completed treatment for conditions other than malignant neoplasm: Secondary | ICD-10-CM | POA: Diagnosis not present

## 2016-04-11 ENCOUNTER — Other Ambulatory Visit: Payer: BLUE CROSS/BLUE SHIELD

## 2016-04-11 ENCOUNTER — Telehealth: Payer: Self-pay | Admitting: Radiation Therapy

## 2016-04-11 ENCOUNTER — Ambulatory Visit
Admission: RE | Admit: 2016-04-11 | Discharge: 2016-04-11 | Disposition: A | Discharge status: Home / Self Care | Payer: Commercial Managed Care - HMO | Source: Ambulatory Visit | Attending: Radiation Oncology | Admitting: Radiation Oncology

## 2016-04-11 DIAGNOSIS — D352 Benign neoplasm of pituitary gland: Secondary | ICD-10-CM

## 2016-04-11 MED ORDER — GADOBENATE DIMEGLUMINE 529 MG/ML IV SOLN
14.0000 mL | Freq: Once | INTRAVENOUS | Status: AC | PRN
  Administered 2016-04-11: 14 mL via INTRAVENOUS

## 2016-04-11 NOTE — Telephone Encounter (Signed)
Opened in error

## 2016-04-12 ENCOUNTER — Ambulatory Visit (HOSPITAL_COMMUNITY): Payer: Commercial Managed Care - HMO

## 2016-04-17 ENCOUNTER — Ambulatory Visit: Payer: Self-pay | Admitting: Radiation Oncology

## 2016-04-17 ENCOUNTER — Encounter: Payer: Self-pay | Admitting: Genetic Counselor

## 2016-04-17 ENCOUNTER — Ambulatory Visit
Admission: RE | Admit: 2016-04-17 | Discharge: 2016-04-17 | Disposition: A | Discharge status: Home / Self Care | Payer: Commercial Managed Care - HMO | Source: Ambulatory Visit | Attending: Radiation Oncology | Admitting: Radiation Oncology

## 2016-04-17 VITALS — BP 127/83 | HR 90 | Resp 18 | Wt >= 6400 oz

## 2016-04-17 DIAGNOSIS — I5032 Chronic diastolic (congestive) heart failure: Secondary | ICD-10-CM | POA: Insufficient documentation

## 2016-04-17 DIAGNOSIS — I110 Hypertensive heart disease with heart failure: Secondary | ICD-10-CM | POA: Insufficient documentation

## 2016-04-17 DIAGNOSIS — Z800 Family history of malignant neoplasm of digestive organs: Secondary | ICD-10-CM | POA: Diagnosis not present

## 2016-04-17 DIAGNOSIS — Z79899 Other long term (current) drug therapy: Secondary | ICD-10-CM | POA: Insufficient documentation

## 2016-04-17 DIAGNOSIS — Z8249 Family history of ischemic heart disease and other diseases of the circulatory system: Secondary | ICD-10-CM | POA: Diagnosis not present

## 2016-04-17 DIAGNOSIS — M109 Gout, unspecified: Secondary | ICD-10-CM | POA: Insufficient documentation

## 2016-04-17 DIAGNOSIS — F329 Major depressive disorder, single episode, unspecified: Secondary | ICD-10-CM | POA: Diagnosis not present

## 2016-04-17 DIAGNOSIS — D352 Benign neoplasm of pituitary gland: Secondary | ICD-10-CM | POA: Insufficient documentation

## 2016-04-17 DIAGNOSIS — G4730 Sleep apnea, unspecified: Secondary | ICD-10-CM | POA: Diagnosis not present

## 2016-04-17 DIAGNOSIS — Z880 Allergy status to penicillin: Secondary | ICD-10-CM | POA: Insufficient documentation

## 2016-04-17 DIAGNOSIS — M1990 Unspecified osteoarthritis, unspecified site: Secondary | ICD-10-CM | POA: Insufficient documentation

## 2016-04-17 DIAGNOSIS — J309 Allergic rhinitis, unspecified: Secondary | ICD-10-CM | POA: Insufficient documentation

## 2016-04-17 DIAGNOSIS — Z803 Family history of malignant neoplasm of breast: Secondary | ICD-10-CM | POA: Insufficient documentation

## 2016-04-17 DIAGNOSIS — Z888 Allergy status to other drugs, medicaments and biological substances status: Secondary | ICD-10-CM | POA: Insufficient documentation

## 2016-04-17 DIAGNOSIS — G629 Polyneuropathy, unspecified: Secondary | ICD-10-CM | POA: Insufficient documentation

## 2016-04-17 DIAGNOSIS — F419 Anxiety disorder, unspecified: Secondary | ICD-10-CM | POA: Diagnosis not present

## 2016-04-17 DIAGNOSIS — Z923 Personal history of irradiation: Secondary | ICD-10-CM | POA: Insufficient documentation

## 2016-04-17 DIAGNOSIS — Z08 Encounter for follow-up examination after completed treatment for malignant neoplasm: Secondary | ICD-10-CM | POA: Diagnosis not present

## 2016-04-17 DIAGNOSIS — Z9889 Other specified postprocedural states: Secondary | ICD-10-CM | POA: Diagnosis not present

## 2016-04-17 NOTE — Addendum Note (Signed)
Encounter addended by: Heywood Footman, RN on: 04/17/2016 10:34 AM<BR>    Actions taken: Charge Capture section accepted

## 2016-04-17 NOTE — Progress Notes (Signed)
Radiation Oncology         [phone]) [phone] ________________________________  Name: [name] [name] MRN: [id number]  Date: 04/17/2016  DOB: Mar 08, 1966    Follow-Up Visit Note  CC: [name], MD  [name], MD  Diagnosis:  50 y.o. 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  Narrative: This is a pleasant 50 y.o. male with a history of a secretory pituitary tumor who originally had an elevated ACTH prior to transsphenoid surgical resection. He received postoperative radiotherapy and has since been followed in surveillance. He underwent recent MRI on 04/11/16, which revealed continued improvement in residual tumor in the right cavernous sinus and no significant residual tumor within the sella.  On review of systems, the patient reports that he is doing well overall. He denies any chest pain, shortness of breath, cough, fevers, chills, night sweats, unintended weight changes. He denies any bowel or bladder disturbances, and denies abdominal pain, nausea or vomiting. He reports continued joint pain and occasional days of low energy. He reports breaking a rib recently. Denies any visual or auditory changes. In fact, he reports his vision has improved per his opthalmologist. He reports difficulty sleeping continues, and is in the process of working on restructuring the dose of his hydrocortisone with Dr. [name], his endocrinologist.. A complete review of systems is obtained and is otherwise negative.  Past Medical History:  Past Medical History:  Diagnosis Date  . Allergic rhinitis   . Anxiety   . Arthritis   . Depression   . Diastolic CHF ([place])   . Gout   . H/O acute respiratory failure   . Hypertension   . Morbid obesity ([place])   . Nephrolithiasis   . Peripheral  neuropathy ([place])   . Pituitary tumor   . PONV (postoperative nausea and vomiting)   . Sleep apnea    uses bi-pap    Past Surgical History: Past Surgical History:  Procedure Laterality Date  . ABDOMINAL SURGERY  06/2013   for cyst with MRSA  . ACHILLES TENDON REPAIR Bilateral   . APPLICATION OF A-CELL OF CHEST/ABDOMEN N/A 01/26/2014   Procedure: PLACEMENT OF A CELL AND VAC ;  Surgeon: [name], DO;  Location: [facility];  Service: Plastics;  Laterality: N/A;  . APPLICATION OF A-CELL OF CHEST/ABDOMEN N/A 02/02/2014   Procedure: WITH PLACEMENT OF A CELL AND VAC ;  Surgeon: [name], DO;  Location: [facility];  Service: Plastics;  Laterality: N/A;  . BRAIN SURGERY    . COLON RESECTION    . CRANIOTOMY N/A 12/10/2013   Procedure: Transpheniodal resection of Pituitary tumor with Dr. Radene Journey for approach;  Surgeon: Floyce Stakes, MD;  Location: [facility] NEURO ORS;  Service: Neurosurgery;  Laterality: N/A;  Transpheniodal resection of Pituitary tumor with Dr. Radene Journey for approach  . HERNIA REPAIR    . INCISION AND DRAINAGE OF WOUND N/A 01/26/2014   Procedure: IRRIGATION AND DEBRIDEMENT ABDOMINAL WOUND WITH ;  Surgeon: [name], DO;  Location: [facility];  Service: Plastics;  Laterality: N/A;  . INCISION AND DRAINAGE OF WOUND N/A 02/02/2014   Procedure: IRRIGATION AND DEBRIDEMENT ABDOMINAL WOUND;  Surgeon: [name], DO;  Location: [place];  Service: Plastics;  Laterality: N/A;  . INCISION AND DRAINAGE OF WOUND N/A 02/12/2014   Procedure: IRRIGATION AND DEBRIDEMENT OF ABDOMINAL WOUND/PLACEMENT OF  A-CELL AND VAC;  Surgeon: [name], DO;  Location: [place];  Service: Plastics;  Laterality: N/A;  . INCISION AND DRAINAGE OF WOUND N/A 04/01/2014   Procedure: IRRIGATION AND DEBRIDEMENT ABDOMINAL WOUND WITH PLACEMENT OF ACELL/VAC;  Surgeon: [name], DO;  Location: [facility] [place] [facility];  Service: Plastics;  Laterality: N/A;  . Sigmoid Colonoscopy    . TRANSNASAL APPROACH N/A 12/10/2013   Procedure: TRANSNASAL  APPROACH;  Surgeon: Rozetta Nunnery, MD;  Location: [facility] NEURO ORS;  Service: ENT;  Laterality: N/A;    Social History:  Social History   Social History  . Marital status: Single    Spouse name: N/A  . Number of children: N/A  . Years of education: N/A   Occupational History  . Not on file.   Social History Main Topics  . Smoking status: Never Smoker  . Smokeless tobacco: Never Used  . Alcohol use No  . Drug use: No  . Sexual activity: No   Other Topics Concern  . Not on file   Social History Narrative  . No narrative on file    Family History: Family History  Problem Relation Age of Onset  . Hypertension Mother   . Hypertension Father   . Gout Father   . Hypothyroidism Father   . Gout Brother   . Cancer Maternal Aunt     colon  . Cancer Maternal Grandmother     breast    ALLERGIES:  is allergic to morphine; oxycodone; penicillins; buprenorphine hcl; and morphine and related.  Meds: Current Outpatient Prescriptions  Medication Sig Dispense Refill  . allopurinol (ZYLOPRIM) 300 MG tablet Take 300 mg by mouth daily.    Marland Kitchen ALPRAZolam (XANAX) 0.25 MG tablet Take 1 tablet (0.25 mg total) by mouth 3 (three) times daily as needed for anxiety or sleep (and QHS prn insomnia). 60 tablet 0  . buPROPion (WELLBUTRIN XL) 300 MG 24 hr tablet Take 300 mg by mouth daily.    . carboxymethylcellulose (REFRESH TEARS) 0.5 % SOLN Place 2 drops into both eyes daily as needed (for dry eyes).    . cyclobenzaprine (FLEXERIL) 10 MG tablet Take 10 mg by mouth daily as needed for muscle spasms.    . diclofenac sodium (VOLTAREN) 1 % GEL Apply 4 g topically daily as needed (pain).    . Diclofenac-Misoprostol (ARTHROTEC) 75-0.2 MG TBEC Take 1 tablet by mouth 2 (two) times daily.     . fluticasone (FLONASE) 50 MCG/ACT nasal spray Place 1 spray into both nostrils daily as needed for allergies or rhinitis.     Marland Kitchen gabapentin (NEURONTIN) 300 MG capsule Take 300 mg by mouth 3 (three) times daily.     Marland Kitchen HYDROcodone-acetaminophen (NORCO) 7.5-325 MG per tablet Take 1 tablet by mouth every 6 (six) hours as needed for moderate pain.    Marland Kitchen levothyroxine (SYNTHROID, LEVOTHROID) 75 MCG tablet Take 75 mcg by mouth daily before breakfast.    . lidocaine (XYLOCAINE) 2 % jelly     . Nutritional Supplements (JUVEN PO) Take 1 packet by mouth 2 (two) times daily. Wound healing    . sertraline (ZOLOFT) 100 MG tablet Take 200 mg by mouth daily.    Marland Kitchen topiramate (TOPAMAX) 50 MG tablet Take 1 tablet (50 mg total) by mouth 2 (two) times daily. 60 tablet 1  . traMADol (ULTRAM) 50 MG tablet     . triamcinolone cream (KENALOG) 0.1 %     . vitamin B-12 (CYANOCOBALAMIN) 1000 MCG tablet Take 1,000 mcg by mouth  daily.    . zolpidem (AMBIEN) 10 MG tablet Take 1 tablet (10 mg total) by mouth at bedtime as needed for sleep. 30 tablet 5  . albuterol (PROVENTIL HFA;VENTOLIN HFA) 108 (90 BASE) MCG/ACT inhaler Inhale 2 puffs into the lungs every 6 (six) hours as needed for wheezing or shortness of breath.    Marland Kitchen LORazepam (ATIVAN) 0.5 MG tablet Take one tab po 30 minutes prior to MRI scans (Patient not taking: Reported on 04/17/2016) 10 tablet 0  . magnesium 30 MG tablet Take 30 mg by mouth 2 (two) times daily.    . Multiple Vitamin (MULTIVITAMIN) tablet Take 1 tablet by mouth daily. Centrum brand    . ondansetron (ZOFRAN) 8 MG tablet Take 8 mg by mouth every 8 (eight) hours as needed for nausea.    . vitamin C (ASCORBIC ACID) 500 MG tablet Take 500 mg by mouth daily.    . Zinc 50 MG TABS Take 50 mg by mouth daily.     No current facility-administered medications for this encounter.     Physical Findings:  weight is 430 lb (195 kg) (abnormal). His blood pressure is 127/83 and his pulse is 90. His respiration is 18 and oxygen saturation is 99%.   In general this is a well appearing Caucasian male in no acute distress. He's alert and oriented x4 and appropriate throughout the examination. Cardiopulmonary assessment is negative  for acute distress and he exhibits normal effort. The patient presents in a motorized wheelchair.   Lab Findings: Lab Results  Component Value Date   WBC 6.4 04/01/2014   HGB 13.1 04/01/2014   HCT 41.6 04/01/2014   PLT 157 04/01/2014    Lab Results  Component Value Date   NA 143 04/01/2014   K 4.0 04/01/2014   CO2 23 04/01/2014   GLUCOSE 96 04/01/2014   BUN 21 04/01/2014   CREATININE 0.93 08/28/2014   BILITOT 0.4 12/06/2013   ALKPHOS 100 12/06/2013   AST 23 12/06/2013   ALT 35 12/06/2013   PROT 7.1 12/06/2013   ALBUMIN 3.9 12/06/2013   CALCIUM 9.2 04/01/2014   ANIONGAP 13 04/01/2014    Radiographic Findings: [name] [id number] Contrast  Result Date: 04/11/2016 CLINICAL DATA:  Pituitary adenoma. Surgical resection 12/07/2013. Twenty-two month stereotactic radiosurgery restaging. Creatinine was obtained on site at [facility] at [address] Results: Creatinine 1.1 mg/dL. EXAM: MRI HEAD WITHOUT AND WITH CONTRAST TECHNIQUE: Multiplanar, multiecho pulse sequences of the brain and surrounding structures were obtained without and with intravenous contrast. CONTRAST:  83mL MULTIHANCE GADOBENATE DIMEGLUMINE 529 MG/ML IV SOLN COMPARISON:  MRI 09/30/2015 FINDINGS: Brain: Image quality degraded by motion on several the postcontrast sequences. Continued improvement and enhancing tumor in the right cavernous sinus. This is difficult to measure as it is infiltrative and non masslike. There is tumor surrounding the right cavernous carotid without significant carotid narrowing or occlusion. The infundibulum is displaced to the left unchanged. No left cavernous sinus tumor. Small residual pituitary tissue on the left is unchanged. Ventricle size normal. No acute infarct. Mild chronic microvascular ischemic changes in the white matter. Vascular: Normal flow voids Skull and upper cervical spine: Negative Sinuses/Orbits: Negative Other: None IMPRESSION: Continued improvement in residual tumor  in the right cavernous sinus. No significant residual tumor within the sella. Electronically Signed   By: Franchot Gallo M.D.   On: 04/11/2016 15:52   Impression/Plan: 1. ACTH secretory pituitary adenoma: The patient will return in 6 months for a follow up  and will have a brain MRI prior to his visit. He will continue to follow up as well with his endocrinologist, Dr. [name]. He is encouraged to call if he has questions or concerns regarding his care. 2. Sleep disturbances. As the patient is working with endocrinology on his hydrocortisone dosing, we will follow along. He continues to take Ambien, and has good nights of rest and others that are not so predictable.  The above documentation reflects my direct findings during this shared patient visit. Please see the separate note by Dr. Tammi Klippel on this date for the remainder of the patient's plan of care.     Carola Rhine, PAC  This document serves as a record of services personally performed by Shona Simpson, PA-C and Tyler Pita, MD. It was created on their behalf by Darcus Austin, a trained medical scribe. The creation of this record is based on the scribe's personal observations and the providers' statements to them. This document has been checked and approved by the attending provider.

## 2016-04-17 NOTE — Progress Notes (Signed)
Weight and vitals stable. Reports continued joint pain. Reports occasional days of low energy. Patient alert and oriented to person, place, and time. Speech fluent. Denies any visual or auditory changes. PERRLA. Reports difficulty sleeping continues.   BP 127/83 (BP Location: Left Arm, Patient Position: Sitting, Cuff Size: Large)   Pulse 90   Resp 18   Wt (!) 430 lb (195 kg)   SpO2 99%   BMI 56.73 kg/m  Wt Readings from Last 3 Encounters:  04/17/16 (!) 430 lb (195 kg)  09/30/15 (!) 438 lb (198.7 kg)  03/22/15 (!) 438 lb (198.7 kg)

## 2016-04-24 ENCOUNTER — Ambulatory Visit: Payer: Self-pay | Admitting: Radiation Oncology

## 2016-04-26 DIAGNOSIS — E248 Other Cushing's syndrome: Secondary | ICD-10-CM | POA: Diagnosis not present

## 2016-04-26 DIAGNOSIS — Z23 Encounter for immunization: Secondary | ICD-10-CM | POA: Diagnosis not present

## 2016-04-26 DIAGNOSIS — E038 Other specified hypothyroidism: Secondary | ICD-10-CM | POA: Diagnosis not present

## 2016-04-26 DIAGNOSIS — E230 Hypopituitarism: Secondary | ICD-10-CM | POA: Diagnosis not present

## 2016-04-26 DIAGNOSIS — D497 Neoplasm of unspecified behavior of endocrine glands and other parts of nervous system: Secondary | ICD-10-CM | POA: Diagnosis not present

## 2016-05-15 ENCOUNTER — Other Ambulatory Visit: Payer: Self-pay | Admitting: Radiation Oncology

## 2016-05-15 DIAGNOSIS — D352 Benign neoplasm of pituitary gland: Secondary | ICD-10-CM

## 2016-05-15 DIAGNOSIS — F5101 Primary insomnia: Secondary | ICD-10-CM

## 2016-05-15 MED ORDER — ZOLPIDEM TARTRATE 10 MG PO TABS: 10.0000 mg | ORAL_TABLET | Freq: Every evening | ORAL | 5 refills | Status: AC | PRN

## 2016-05-16 ENCOUNTER — Telehealth: Payer: Self-pay | Admitting: Radiation Oncology

## 2016-05-16 NOTE — Telephone Encounter (Signed)
Left message on prescriber voicemail with Ambien 10 mg script as ordered by Dr. Tammi Klippel. Phoned patient making him aware this was done. Patient verbalized understanding and expressed appreciation for the call.

## 2016-06-15 DIAGNOSIS — D497 Neoplasm of unspecified behavior of endocrine glands and other parts of nervous system: Secondary | ICD-10-CM | POA: Diagnosis not present

## 2016-06-15 DIAGNOSIS — E2740 Unspecified adrenocortical insufficiency: Secondary | ICD-10-CM | POA: Diagnosis not present

## 2016-06-15 DIAGNOSIS — E249 Cushing's syndrome, unspecified: Secondary | ICD-10-CM | POA: Diagnosis not present

## 2016-07-03 DIAGNOSIS — I10 Essential (primary) hypertension: Secondary | ICD-10-CM | POA: Diagnosis not present

## 2016-07-03 DIAGNOSIS — E038 Other specified hypothyroidism: Secondary | ICD-10-CM | POA: Diagnosis not present

## 2016-07-03 DIAGNOSIS — E2740 Unspecified adrenocortical insufficiency: Secondary | ICD-10-CM | POA: Diagnosis not present

## 2016-07-03 DIAGNOSIS — E230 Hypopituitarism: Secondary | ICD-10-CM | POA: Diagnosis not present

## 2016-07-04 DIAGNOSIS — G4700 Insomnia, unspecified: Secondary | ICD-10-CM | POA: Diagnosis not present

## 2016-07-04 DIAGNOSIS — M2550 Pain in unspecified joint: Secondary | ICD-10-CM | POA: Diagnosis not present

## 2016-07-17 IMAGING — MR MR HEAD WO/W CM
13 of 21 series · 22 of 48 positions shown · IV contrast (multihance)
Comparison: 12/07/2013

CLINICAL DATA: Headache. Blurred vision. Reduced is. Previous
pituitary tumor resection.

EXAM:
MRI HEAD WITHOUT AND WITH CONTRAST
TECHNIQUE: Multiplanar, multiecho pulse sequences of the brain and surrounding
structures were obtained without and with intravenous contrast.
CONTRAST:  15mL MULTIHANCE GADOBENATE DIMEGLUMINE 529 MG/ML IV SOLN

[Series 2: FLAIR · sagittal · 5.0mm · 0.47mm/px · 1 of 24 slices shown (1 of 2)]
[im 1/24]
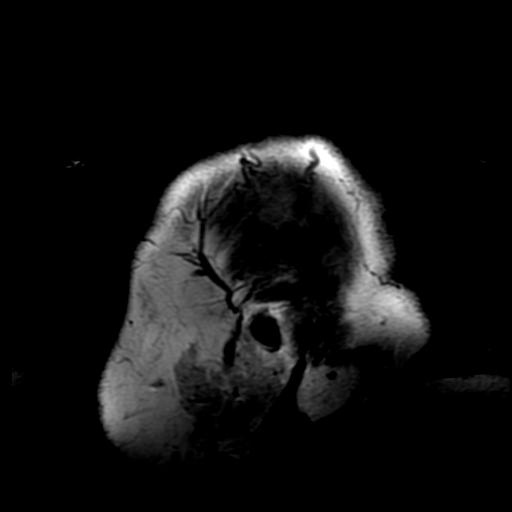

[Series 4: T2 · axial · 5.0mm · 0.45mm/px · 1 of 26 slices shown (1 of 2)]
[im 1/26]
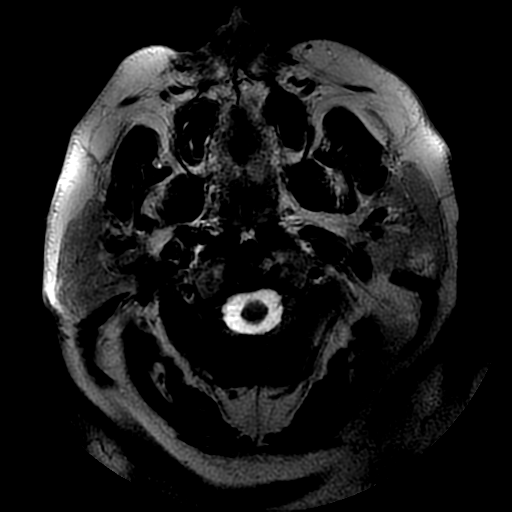

[Series 5: FLAIR · axial · 5.0mm · 0.45mm/px · 1 of 26 slices shown (2 of 2)]
[im 1/26]
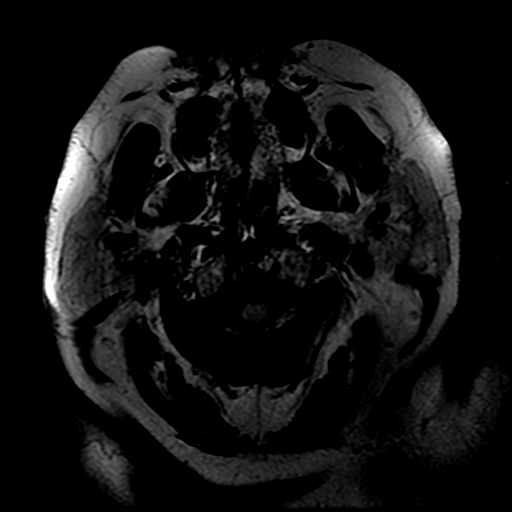

[Series 6: DWI · axial · 5.0mm · 1.02mm/px · z∈[-74,+75]mm · 3 of 62 slices shown (1 of 2)]
[im 1/62]
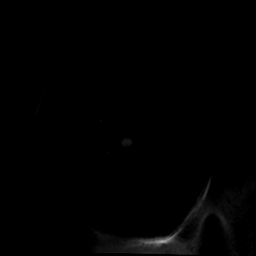
[im 31/62]
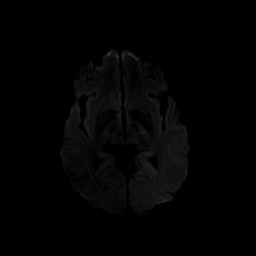
[im 62/62]
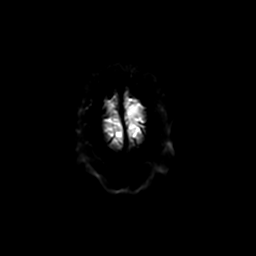

[Series 7: (person_name) · axial · 3.2mm · 0.47mm/px · z∈[-78,+1]mm · 4 of 100 slices shown]
[im 1/100]
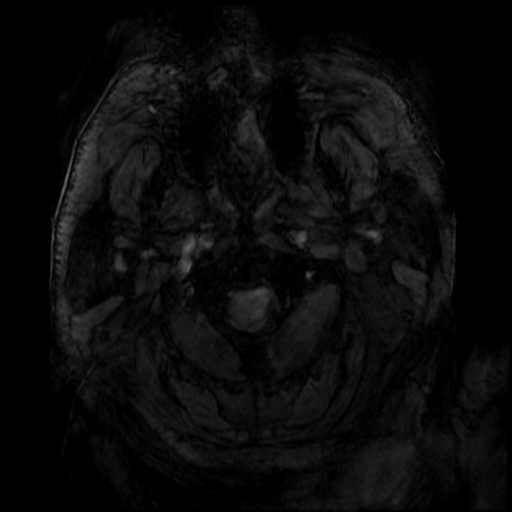
[im 17/100]
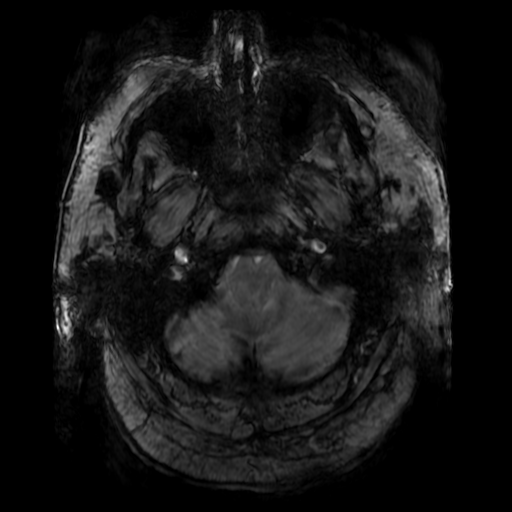
[im 34/100]
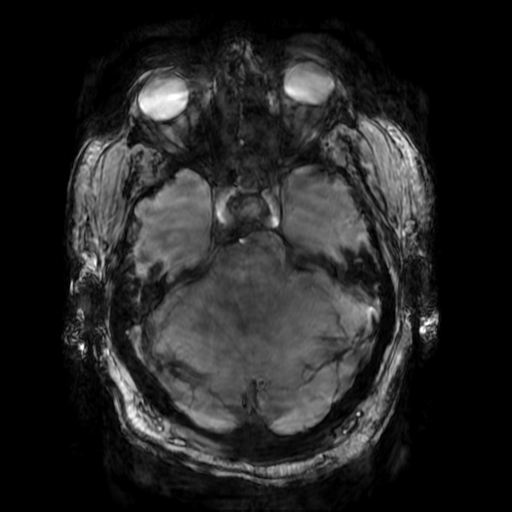
[im 50/100]
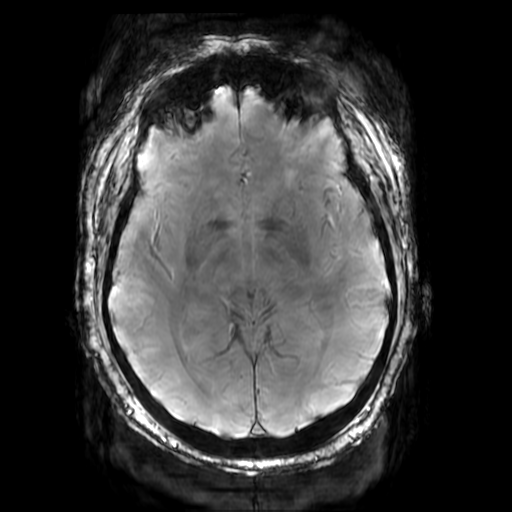

[Series 9: T1 · sagittal · non-contrast · 3.0mm · 0.29mm/px · 1 of 12 slices shown (1 of 5)]
[im 1/12]
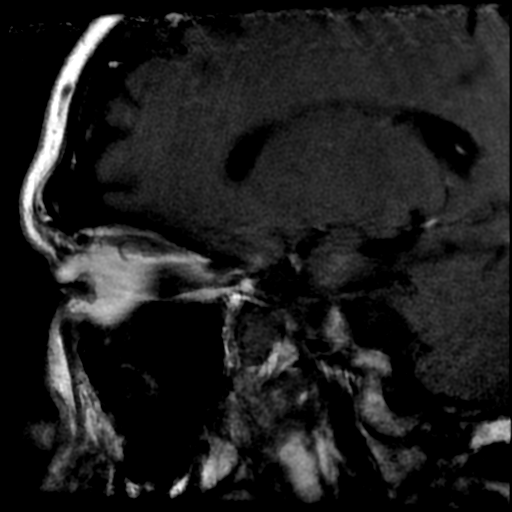

[Series 10: T1 · coronal · non-contrast · 3.0mm · 0.29mm/px · 1 of 12 slices shown (2 of 5)]
[im 1/12]
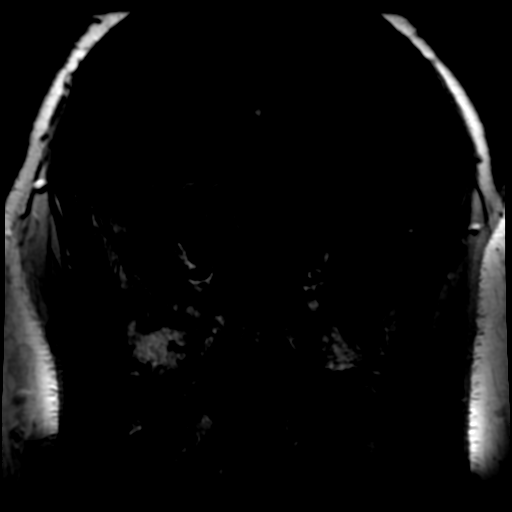

[Series 11: T2 · coronal · 5.0mm · 0.47mm/px · 2 of 27 slices shown (2 of 2)]
[im 1/27]
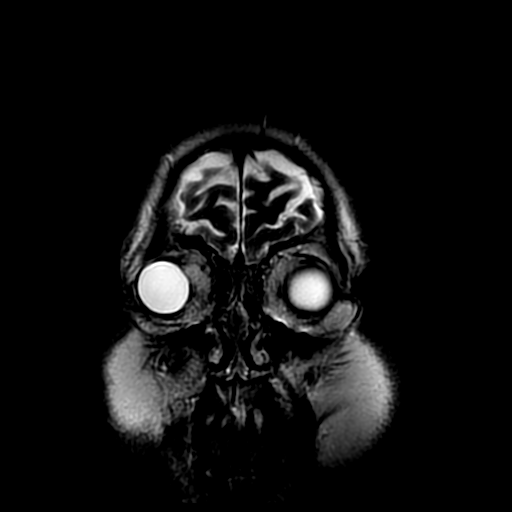
[im 27/27]
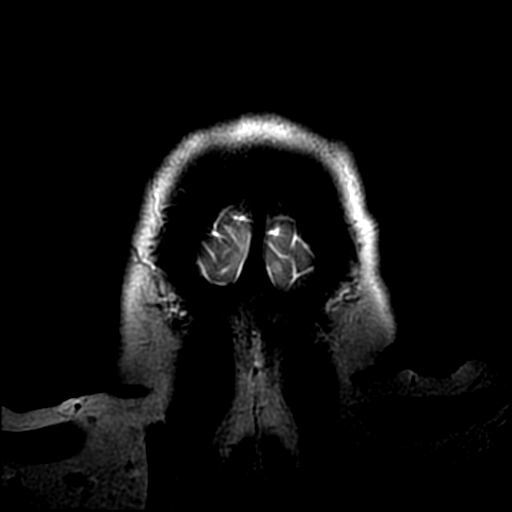

[Series 14: T1 post-contrast · sagittal · 3.0mm · 0.29mm/px · 1 of 12 slices shown]
[im 1/12]
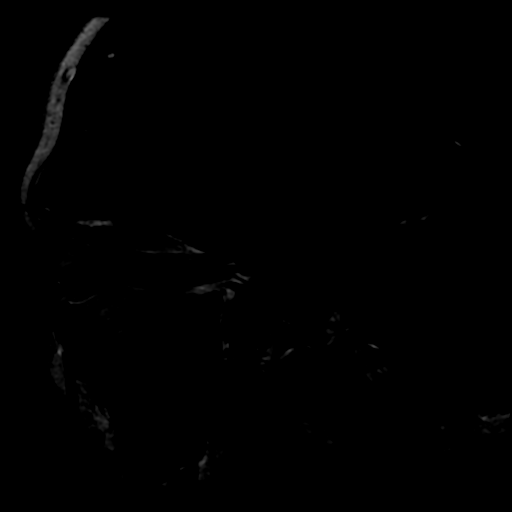

[Series 15: T1 · coronal · 3.0mm · 0.33mm/px · 1 of 12 slices shown (3 of 5)]
[im 1/12]
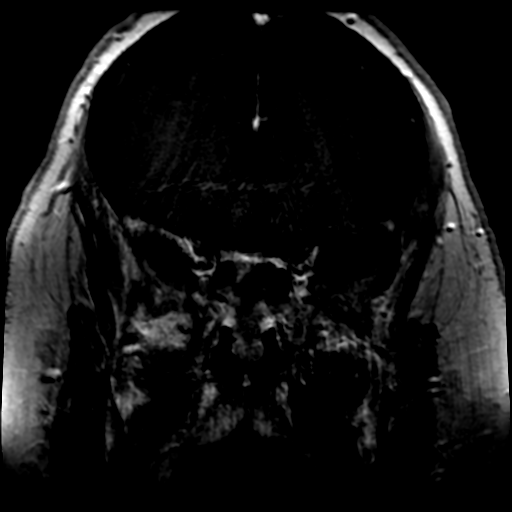

[Series 16: T1 · coronal · 5.0mm · 0.43mm/px · 2 of 30 slices shown (4 of 5)]
[im 1/30]
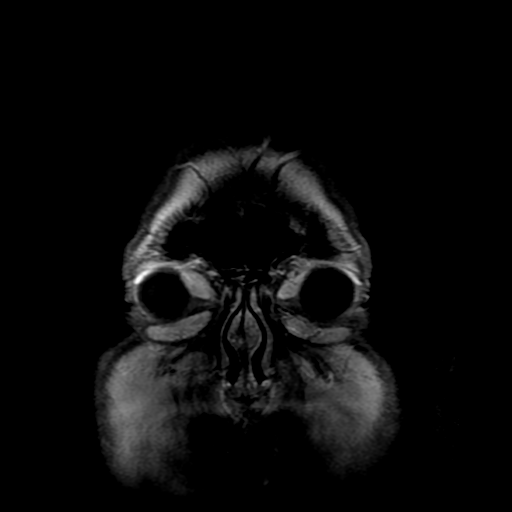
[im 30/30]
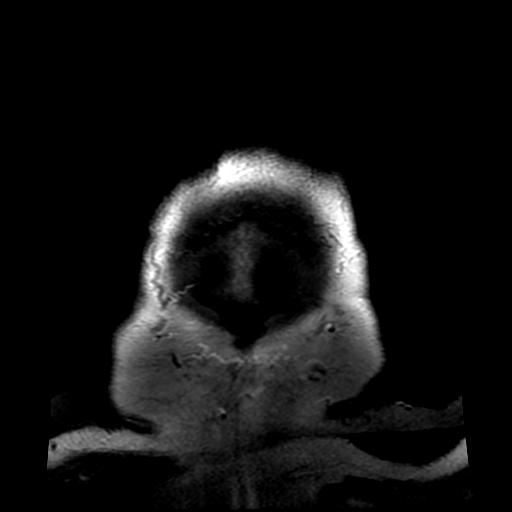

[Series 17: T1 · axial · 5.0mm · 0.43mm/px · z∈[-74,+75]mm · 2 of 26 slices shown (5 of 5)]
[im 1/26]
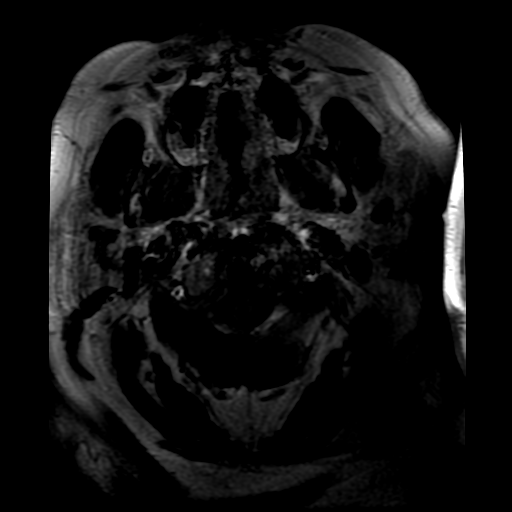
[im 26/26]
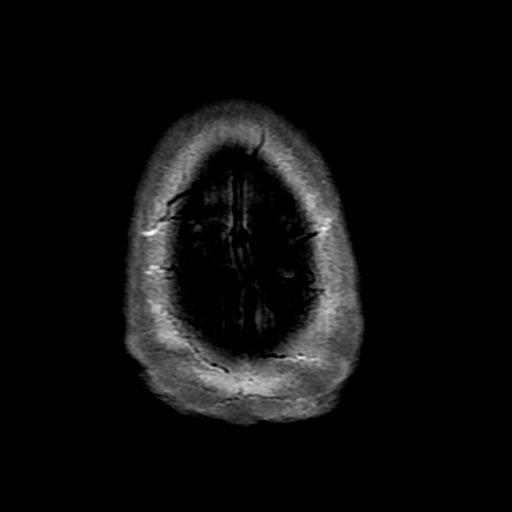

[Series 600: DWI · axial · 5.0mm · 1.02mm/px · z∈[-74,+75]mm · 2 of 31 slices shown (2 of 2)]
[im 1/31]
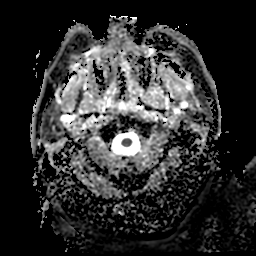
[im 31/31]
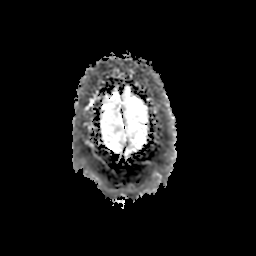

[22 of 48 positions shown; findings below may reference images not displayed]

FINDINGS: There is no acute or subacute infarction. The brainstem cerebellum
are normal. Cerebral hemispheres are normal. No hydrocephalus or
extra-axial collection. No evidence of intracranial hemorrhage.

The patient has had transsphenoidal surgery for debulking of the
previously seen large an invasive sellar region mass. Most of the
tissue the in the central sella in the tissue extending into the
suprasellar region has been removed. There continues to be abnormal
signal within the clivus consistent with bone invasion. Tumor
continues to fill the right cavernous sinus. There is probably early
invasion of the left cavernous sinus. Packing material is present in
the sphenoid sinus.
IMPRESSION: Interval transsphenoidal surgery. Considerable debulking of the
invasive mass with the epicenter in the region of the sella.
Complete removal of the suprasellar component. Residual tumor is
noted within the clivus, filling the right cavernous sinus and with
early involvement likely of the left cavernous sinus.

## 2016-08-31 DIAGNOSIS — E038 Other specified hypothyroidism: Secondary | ICD-10-CM | POA: Diagnosis not present

## 2016-08-31 DIAGNOSIS — E2740 Unspecified adrenocortical insufficiency: Secondary | ICD-10-CM | POA: Diagnosis not present

## 2016-09-21 DIAGNOSIS — G8929 Other chronic pain: Secondary | ICD-10-CM | POA: Diagnosis not present

## 2016-10-27 DIAGNOSIS — J069 Acute upper respiratory infection, unspecified: Secondary | ICD-10-CM | POA: Diagnosis not present

## 2016-11-23 DIAGNOSIS — Z0000 Encounter for general adult medical examination without abnormal findings: Secondary | ICD-10-CM | POA: Diagnosis not present

## 2016-11-23 DIAGNOSIS — I10 Essential (primary) hypertension: Secondary | ICD-10-CM | POA: Diagnosis not present

## 2016-11-23 DIAGNOSIS — Z125 Encounter for screening for malignant neoplasm of prostate: Secondary | ICD-10-CM | POA: Diagnosis not present

## 2016-11-28 DIAGNOSIS — G629 Polyneuropathy, unspecified: Secondary | ICD-10-CM | POA: Diagnosis not present

## 2016-11-28 DIAGNOSIS — Z0001 Encounter for general adult medical examination with abnormal findings: Secondary | ICD-10-CM | POA: Diagnosis not present

## 2016-11-28 DIAGNOSIS — I5030 Unspecified diastolic (congestive) heart failure: Secondary | ICD-10-CM | POA: Diagnosis not present

## 2016-11-28 DIAGNOSIS — J309 Allergic rhinitis, unspecified: Secondary | ICD-10-CM | POA: Diagnosis not present

## 2016-11-28 DIAGNOSIS — M2550 Pain in unspecified joint: Secondary | ICD-10-CM | POA: Diagnosis not present

## 2016-11-30 DIAGNOSIS — M2550 Pain in unspecified joint: Secondary | ICD-10-CM | POA: Diagnosis not present

## 2016-11-30 DIAGNOSIS — M79641 Pain in right hand: Secondary | ICD-10-CM | POA: Diagnosis not present

## 2016-11-30 DIAGNOSIS — M79642 Pain in left hand: Secondary | ICD-10-CM | POA: Diagnosis not present

## 2016-12-14 DIAGNOSIS — M2550 Pain in unspecified joint: Secondary | ICD-10-CM | POA: Diagnosis not present

## 2016-12-14 DIAGNOSIS — M109 Gout, unspecified: Secondary | ICD-10-CM | POA: Diagnosis not present

## 2016-12-14 DIAGNOSIS — M79643 Pain in unspecified hand: Secondary | ICD-10-CM | POA: Diagnosis not present

## 2016-12-14 DIAGNOSIS — G4733 Obstructive sleep apnea (adult) (pediatric): Secondary | ICD-10-CM | POA: Diagnosis not present

## 2016-12-14 DIAGNOSIS — M1990 Unspecified osteoarthritis, unspecified site: Secondary | ICD-10-CM | POA: Diagnosis not present

## 2016-12-14 DIAGNOSIS — R5383 Other fatigue: Secondary | ICD-10-CM | POA: Diagnosis not present

## 2016-12-14 DIAGNOSIS — M064 Inflammatory polyarthropathy: Secondary | ICD-10-CM | POA: Diagnosis not present

## 2016-12-14 DIAGNOSIS — G4700 Insomnia, unspecified: Secondary | ICD-10-CM | POA: Diagnosis not present

## 2016-12-14 DIAGNOSIS — G4730 Sleep apnea, unspecified: Secondary | ICD-10-CM | POA: Diagnosis not present

## 2016-12-14 DIAGNOSIS — Z993 Dependence on wheelchair: Secondary | ICD-10-CM | POA: Diagnosis not present

## 2016-12-19 DIAGNOSIS — G8929 Other chronic pain: Secondary | ICD-10-CM | POA: Diagnosis not present

## 2016-12-26 DIAGNOSIS — E230 Hypopituitarism: Secondary | ICD-10-CM | POA: Diagnosis not present

## 2016-12-26 DIAGNOSIS — E038 Other specified hypothyroidism: Secondary | ICD-10-CM | POA: Diagnosis not present

## 2016-12-26 DIAGNOSIS — E2740 Unspecified adrenocortical insufficiency: Secondary | ICD-10-CM | POA: Diagnosis not present

## 2017-01-02 DIAGNOSIS — E2740 Unspecified adrenocortical insufficiency: Secondary | ICD-10-CM | POA: Diagnosis not present

## 2017-01-02 DIAGNOSIS — E230 Hypopituitarism: Secondary | ICD-10-CM | POA: Diagnosis not present

## 2017-01-02 DIAGNOSIS — D497 Neoplasm of unspecified behavior of endocrine glands and other parts of nervous system: Secondary | ICD-10-CM | POA: Diagnosis not present

## 2017-01-02 DIAGNOSIS — R7303 Prediabetes: Secondary | ICD-10-CM | POA: Diagnosis not present

## 2017-01-10 DIAGNOSIS — E2740 Unspecified adrenocortical insufficiency: Secondary | ICD-10-CM | POA: Diagnosis not present

## 2017-01-22 ENCOUNTER — Other Ambulatory Visit: Payer: Self-pay | Admitting: Radiation Therapy

## 2017-01-22 DIAGNOSIS — D352 Benign neoplasm of pituitary gland: Secondary | ICD-10-CM

## 2017-01-23 DIAGNOSIS — E2740 Unspecified adrenocortical insufficiency: Secondary | ICD-10-CM | POA: Diagnosis not present

## 2017-01-30 ENCOUNTER — Ambulatory Visit (HOSPITAL_COMMUNITY): Payer: Medicare HMO

## 2017-01-31 ENCOUNTER — Inpatient Hospital Stay
Admission: RE | Admit: 2017-01-31 | Discharge: 2017-01-31 | Disposition: A | Discharge status: Home / Self Care | Payer: Self-pay | Source: Ambulatory Visit | Attending: Urology | Admitting: Urology

## 2017-02-09 ENCOUNTER — Ambulatory Visit (HOSPITAL_COMMUNITY): Payer: Medicare HMO

## 2017-02-12 ENCOUNTER — Inpatient Hospital Stay: Admission: RE | Admit: 2017-02-12 | Payer: Self-pay | Source: Ambulatory Visit | Admitting: Radiation Oncology

## 2017-02-13 DIAGNOSIS — E240 Pituitary-dependent Cushing's disease: Secondary | ICD-10-CM | POA: Diagnosis not present

## 2017-02-13 DIAGNOSIS — M545 Low back pain: Secondary | ICD-10-CM | POA: Diagnosis not present

## 2017-02-13 DIAGNOSIS — G4733 Obstructive sleep apnea (adult) (pediatric): Secondary | ICD-10-CM | POA: Diagnosis not present

## 2017-02-13 DIAGNOSIS — R0602 Shortness of breath: Secondary | ICD-10-CM | POA: Diagnosis not present

## 2017-02-16 ENCOUNTER — Ambulatory Visit (HOSPITAL_COMMUNITY): Payer: Medicare HMO

## 2017-02-19 NOTE — Progress Notes (Signed)
[name] [name] 51 y.o. 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   PAIN: Pain all over especially in his joints. States that he also has muscle fatigue.  NEURO: Pt alert &oriented x 3 with fluent speech,   Denies reports visual or auditory disturbances, PERRLA. States that he has had a headache for the last  5 day. Took Sudafed and tylenol with no relief.  Fatigue: Yes ,all the time Weight: Patient states that he has had a weight change. States that he weights 440 pounds. Was unable to stand to be weighted.   Vitals:   02/28/17 1134  BP: 129/90  Pulse: 88  Resp: 20  Temp: 98.3 F (36.8 C)  TempSrc: Oral  SpO2: 94%   Wt Readings from Last 3 Encounters:  04/17/16 (!) 430 lb (195 kg)  09/30/15 (!) 438 lb (198.7 kg)  03/22/15 (!) 438 lb (198.7 kg)

## 2017-02-20 ENCOUNTER — Ambulatory Visit: Payer: Self-pay | Admitting: Urology

## 2017-02-26 ENCOUNTER — Other Ambulatory Visit: Payer: Self-pay | Admitting: Radiation Oncology

## 2017-02-26 ENCOUNTER — Ambulatory Visit (HOSPITAL_COMMUNITY)
Admission: RE | Admit: 2017-02-26 | Discharge: 2017-02-26 | Disposition: A | Discharge status: Home / Self Care | Payer: Medicare HMO | Source: Ambulatory Visit | Attending: Radiation Oncology | Admitting: Radiation Oncology

## 2017-02-26 DIAGNOSIS — D352 Benign neoplasm of pituitary gland: Secondary | ICD-10-CM | POA: Diagnosis not present

## 2017-02-26 DIAGNOSIS — S0990XA Unspecified injury of head, initial encounter: Secondary | ICD-10-CM | POA: Diagnosis not present

## 2017-02-26 MED ORDER — LORAZEPAM 0.5 MG PO TABS: ORAL_TABLET | ORAL | 0 refills | Status: DC

## 2017-02-26 MED ORDER — GADOBENATE DIMEGLUMINE 529 MG/ML IV SOLN
20.0000 mL | Freq: Once | INTRAVENOUS | Status: AC | PRN
  Administered 2017-02-26: 20 mL via INTRAVENOUS

## 2017-02-27 NOTE — Progress Notes (Signed)
Radiation Oncology         [phone] ________________________________  Name: [name] [name] MRN: [id number]  Date: 02/28/2017  DOB: 03/26/1966    Follow-Up Visit Note  CC: [name] Pretty, MD  [name] Cha, MD  Diagnosis:  51 y.o. 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  Narrative: This is a pleasant 51 y.o. male with a history of a secretory pituitary tumor who originally had an elevated ACTH prior to transsphenoid surgical resection. He received postoperative radiotherapy and has since been followed in surveillance. He underwent recent MRI on 02/26/17, which revealed no significant change from the prior study dated 04/2016 and no recurrent mass lesion.  On review of systems, the patient reports that he is doing well overall. He denies any chest pain, shortness of breath, cough, fevers, chills, night sweats, or unintended weight changes. He denies any bowel or bladder disturbances, and denies abdominal pain, nausea or vomiting. He reports continued diffuse joint pain and occasional days of low energy. He has had a dull frontal headache on the right daily for the past 5 days which he attributes to sinus congestion.  He is taking Sudafed with limited relief but reports that the headache is not significant enough to warrant taking Tylenol or Motrin.  He denies any visual or auditory changes and in fact, he reports his vision has improved per his opthalmologist. He denies dizziness, imbalance or tremors. He continues to have difficulty sleeping but was recently diagnosed with sleep apnea and is in the process of getting set up with a CPAP machine.  He continues under the care of Dr. Bubba [name], his endocrinologist. A complete review of systems is obtained and is otherwise  negative.  Past Medical History:  Past Medical History:  Diagnosis Date  . Allergic rhinitis   . Anxiety   . Arthritis   . Depression   . Diastolic CHF ([place])   . Gout   . H/O acute respiratory failure   . Hypertension   . Morbid obesity ([place])   . Nephrolithiasis   . Peripheral neuropathy ([place])   . Pituitary tumor   . PONV (postoperative nausea and vomiting)   . Sleep apnea    uses bi-pap    Past Surgical History: Past Surgical History:  Procedure Laterality Date  . ABDOMINAL SURGERY  06/2013   for cyst with MRSA  . ACHILLES TENDON REPAIR Bilateral   . APPLICATION OF A-CELL OF CHEST/ABDOMEN N/A 01/26/2014   Procedure: PLACEMENT OF A CELL AND VAC ;  Surgeon: [name], DO;  Location: [facility];  Service: Plastics;  Laterality: N/A;  . APPLICATION OF A-CELL OF CHEST/ABDOMEN N/A 02/02/2014   Procedure: WITH PLACEMENT OF A CELL AND VAC ;  Surgeon: [name], DO;  Location: [facility];  Service: Plastics;  Laterality: N/A;  . BRAIN SURGERY    . COLON RESECTION    . CRANIOTOMY N/A 12/10/2013   Procedure: Transpheniodal resection of Pituitary tumor with Dr. Radene Journey for approach;  Surgeon: Floyce Stakes, MD;  Location: [facility]UR[facility]S;  Service: Neurosurgery;  Laterality: N/A;  Transpheniodal resection of Pituitary tumor with Dr. Radene Journey for approach  . HERNIA REPAIR    . INCISION AND DRAINAGE OF WOUND N/A 01/26/2014   Procedure: IRRIGATION AND DEBRIDEMENT ABDOMINAL WOUND WITH ;  Surgeon: [name],  DO;  Location: [facility] [place];  Service: Plastics;  Laterality: N/A;  . INCISION AND DRAINAGE OF WOUND N/A 02/02/2014   Procedure: IRRIGATION AND DEBRIDEMENT ABDOMINAL WOUND;  Surgeon: [name], DO;  Location: [place];  Service: Plastics;  Laterality: N/A;  . INCISION AND DRAINAGE OF WOUND N/A 02/12/2014   Procedure: IRRIGATION AND DEBRIDEMENT OF ABDOMINAL WOUND/PLACEMENT OF A-CELL AND VAC;  Surgeon: [name], DO;  Location: [place];  Service: Plastics;  Laterality: N/A;  . INCISION  AND DRAINAGE OF WOUND N/A 04/01/2014   Procedure: IRRIGATION AND DEBRIDEMENT ABDOMINAL WOUND WITH PLACEMENT OF ACELL/VAC;  Surgeon: [name], DO;  Location: [place];  Service: Plastics;  Laterality: N/A;  . Sigmoid Colonoscopy    . TRANSNASAL APPROACH N/A 12/10/2013   Procedure: TRANSNASAL APPROACH;  Surgeon: Rozetta Nunnery, MD;  Location: [facility] NEURO ORS;  Service: ENT;  Laterality: N/A;    Social History:  Social History   Social History  . Marital status: Single    Spouse name: N/A  . Number of children: N/A  . Years of education: N/A   Occupational History  . Not on file.   Social History Main Topics  . Smoking status: Never Smoker  . Smokeless tobacco: Never Used  . Alcohol use No  . Drug use: No  . Sexual activity: No   Other Topics Concern  . Not on file   Social History Narrative  . No narrative on file    Family History: Family History  Problem Relation Age of Onset  . Hypertension Mother   . Hypertension Father   . Gout Father   . Hypothyroidism Father   . Gout Brother   . Cancer Maternal Aunt        colon  . Cancer Maternal Grandmother        breast    ALLERGIES:  is allergic to morphine; oxycodone; penicillins; buprenorphine hcl; and morphine and related.  Meds: Current Outpatient Prescriptions  Medication Sig Dispense Refill  . albuterol (PROVENTIL HFA;VENTOLIN HFA) 108 (90 BASE) MCG/ACT inhaler Inhale 2 puffs into the lungs every 6 (six) hours as needed for wheezing or shortness of breath.    . allopurinol (ZYLOPRIM) 300 MG tablet Take 300 mg by mouth daily.    Marland Kitchen ALPRAZolam (XANAX) 0.25 MG tablet Take 1 tablet (0.25 mg total) by mouth 3 (three) times daily as needed for anxiety or sleep (and QHS prn insomnia). 60 tablet 0  . buPROPion (WELLBUTRIN XL) 300 MG 24 hr tablet Take 300 mg by mouth daily.    . carboxymethylcellulose (REFRESH TEARS) 0.5 % SOLN Place 2 drops into both eyes daily as needed (for dry eyes).    . cyclobenzaprine (FLEXERIL)  10 MG tablet Take 10 mg by mouth daily as needed for muscle spasms.    . diclofenac sodium (VOLTAREN) 1 % GEL Apply 4 g topically daily as needed (pain).    . Diclofenac-Misoprostol (ARTHROTEC) 75-0.2 MG TBEC Take 1 tablet by mouth 2 (two) times daily.     . fluticasone (FLONASE) 50 MCG/ACT nasal spray Place 1 spray into both nostrils daily as needed for allergies or rhinitis.     Marland Kitchen gabapentin (NEURONTIN) 300 MG capsule Take 300 mg by mouth 3 (three) times daily.    Marland Kitchen HYDROcodone-acetaminophen (NORCO) 7.5-325 MG per tablet Take 1 tablet by mouth every 6 (six) hours as needed for moderate pain.    Marland Kitchen levothyroxine (SYNTHROID, LEVOTHROID) 75 MCG tablet Take 75 mcg by mouth daily before breakfast.    .  lidocaine (XYLOCAINE) 2 % jelly     . LORazepam (ATIVAN) 0.5 MG tablet Take one-two tabs po 30 minutes prior to MRI scans 30 tablet 0  . magnesium 30 MG tablet Take 30 mg by mouth 2 (two) times daily.    . Multiple Vitamin (MULTIVITAMIN) tablet Take 1 tablet by mouth daily. Centrum brand    . Nutritional Supplements (JUVEN PO) Take 1 packet by mouth 2 (two) times daily. Wound healing    . ondansetron (ZOFRAN) 8 MG tablet Take 8 mg by mouth every 8 (eight) hours as needed for nausea.    . sertraline (ZOLOFT) 100 MG tablet Take 200 mg by mouth daily.    Marland Kitchen topiramate (TOPAMAX) 50 MG tablet Take 1 tablet (50 mg total) by mouth 2 (two) times daily. 60 tablet 1  . traMADol (ULTRAM) 50 MG tablet     . triamcinolone cream (KENALOG) 0.1 %     . vitamin B-12 (CYANOCOBALAMIN) 1000 MCG tablet Take 1,000 mcg by mouth daily.    . vitamin C (ASCORBIC ACID) 500 MG tablet Take 500 mg by mouth daily.    . Zinc 50 MG TABS Take 50 mg by mouth daily.    Marland Kitchen zolpidem (AMBIEN) 10 MG tablet Take 1 tablet (10 mg total) by mouth at bedtime as needed for sleep. 30 tablet 5   No current facility-administered medications for this encounter.     Physical Findings:  vitals were not taken for this visit.  In general this is a  very pleasant, well appearing, morbidly obese Caucasian male in no acute distress. He's alert and oriented x4 and appropriate throughout the examination. Cardiopulmonary assessment is negative for acute distress and he exhibits normal effort. The patient presents in a motorized wheelchair.   Lab Findings: Lab Results  Component Value Date   WBC 6.4 04/01/2014   HGB 13.1 04/01/2014   HCT 41.6 04/01/2014   PLT 157 04/01/2014    Lab Results  Component Value Date   NA 143 04/01/2014   K 4.0 04/01/2014   CO2 23 04/01/2014   GLUCOSE 96 04/01/2014   BUN 21 04/01/2014   CREATININE 0.93 08/28/2014   BILITOT 0.4 12/06/2013   ALKPHOS 100 12/06/2013   AST 23 12/06/2013   ALT 35 12/06/2013   PROT 7.1 12/06/2013   ALBUMIN 3.9 12/06/2013   CALCIUM 9.2 04/01/2014   ANIONGAP 13 04/01/2014    Radiographic Findings: [name] QI Contrast  Result Date: 02/27/2017 CLINICAL DATA:  History of pituitary tumor with surgical resection and postoperative radiotherapy EXAM: MRI HEAD WITHOUT AND WITH CONTRAST TECHNIQUE: Multiplanar, multiecho pulse sequences of the brain and surrounding structures were obtained without and with intravenous contrast. CONTRAST:  90mL MULTIHANCE GADOBENATE DIMEGLUMINE 529 MG/ML IV SOLN COMPARISON:  MRI head 04/11/2016, 09/30/2015 FINDINGS: Brain: Image quality degraded by motion. The sella is enlarged. Postop pituitary resection. 5 mm residual pituitary gland on the right side of the sella unchanged. Infundibulum deviated to the left. Mild asymmetry of the cavernous sinus on the right is unchanged. This appears to represent stable mild treated tumor. No intrasellar tumor. Cavernous carotid appears patent bilaterally. No compression of the optic chiasm. Left cavernous sinus normal Ventricle size normal. Mild chronic microvascular ischemia in the white matter. No acute infarct. Negative for hemorrhage. Vascular: Normal arterial flow voids Skull and upper cervical spine: Negative  Sinuses/Orbits: Negative Other: None IMPRESSION: No significant change from the prior study. No recurrent mass lesion. Mild asymmetry of the right cavernous sinus is  stable. Electronically Signed   By: Franchot Gallo M.D.   On: 02/27/2017 07:45   Impression/Plan: 1. ACTH secretory pituitary adenoma: The patient will return in 6 months for a follow up visit with Dr. Mickeal Skinner and will have a repeat brain MRI prior to his visit. He will continue to follow up as well with his endocrinologist, Dr. Bubba [name]. He is encouraged to call if he has questions or concerns regarding his care. 2. Sleep disturbances. He is in the process of getting set up with a CPAP machine for his sleep apnea and is quite optimistic that this will make a significant improvement in his quality of sleep.  He continues to take Ambien qhs which is helpful.    Nicholos Johns, PA-C

## 2017-02-28 ENCOUNTER — Ambulatory Visit
Admission: RE | Admit: 2017-02-28 | Discharge: 2017-02-28 | Disposition: A | Discharge status: Home / Self Care | Payer: Medicare HMO | Source: Ambulatory Visit | Attending: Urology | Admitting: Urology

## 2017-02-28 ENCOUNTER — Encounter: Payer: Self-pay | Admitting: Urology

## 2017-02-28 VITALS — BP 129/90 | HR 88 | Temp 98.3°F | Resp 20

## 2017-02-28 DIAGNOSIS — D352 Benign neoplasm of pituitary gland: Secondary | ICD-10-CM | POA: Insufficient documentation

## 2017-02-28 DIAGNOSIS — I5032 Chronic diastolic (congestive) heart failure: Secondary | ICD-10-CM | POA: Insufficient documentation

## 2017-02-28 DIAGNOSIS — Z79890 Hormone replacement therapy (postmenopausal): Secondary | ICD-10-CM | POA: Diagnosis not present

## 2017-02-28 DIAGNOSIS — G4739 Other sleep apnea: Secondary | ICD-10-CM | POA: Diagnosis not present

## 2017-02-28 DIAGNOSIS — G4730 Sleep apnea, unspecified: Secondary | ICD-10-CM | POA: Diagnosis not present

## 2017-02-28 DIAGNOSIS — M109 Gout, unspecified: Secondary | ICD-10-CM | POA: Diagnosis not present

## 2017-02-28 DIAGNOSIS — J3489 Other specified disorders of nose and nasal sinuses: Secondary | ICD-10-CM | POA: Diagnosis not present

## 2017-02-28 DIAGNOSIS — G629 Polyneuropathy, unspecified: Secondary | ICD-10-CM | POA: Diagnosis not present

## 2017-02-28 DIAGNOSIS — Z79899 Other long term (current) drug therapy: Secondary | ICD-10-CM | POA: Insufficient documentation

## 2017-02-28 DIAGNOSIS — Z08 Encounter for follow-up examination after completed treatment for malignant neoplasm: Secondary | ICD-10-CM | POA: Diagnosis not present

## 2017-02-28 DIAGNOSIS — F419 Anxiety disorder, unspecified: Secondary | ICD-10-CM | POA: Diagnosis not present

## 2017-02-28 DIAGNOSIS — F329 Major depressive disorder, single episode, unspecified: Secondary | ICD-10-CM | POA: Insufficient documentation

## 2017-02-28 DIAGNOSIS — I110 Hypertensive heart disease with heart failure: Secondary | ICD-10-CM | POA: Insufficient documentation

## 2017-02-28 DIAGNOSIS — Z86011 Personal history of benign neoplasm of the brain: Secondary | ICD-10-CM | POA: Diagnosis not present

## 2017-06-29 DIAGNOSIS — R7303 Prediabetes: Secondary | ICD-10-CM | POA: Diagnosis not present

## 2017-06-29 DIAGNOSIS — E038 Other specified hypothyroidism: Secondary | ICD-10-CM | POA: Diagnosis not present

## 2017-06-29 DIAGNOSIS — E230 Hypopituitarism: Secondary | ICD-10-CM | POA: Diagnosis not present

## 2017-07-05 DIAGNOSIS — G629 Polyneuropathy, unspecified: Secondary | ICD-10-CM | POA: Diagnosis not present

## 2017-07-05 DIAGNOSIS — E038 Other specified hypothyroidism: Secondary | ICD-10-CM | POA: Diagnosis not present

## 2017-07-05 DIAGNOSIS — R7303 Prediabetes: Secondary | ICD-10-CM | POA: Diagnosis not present

## 2017-07-05 DIAGNOSIS — D497 Neoplasm of unspecified behavior of endocrine glands and other parts of nervous system: Secondary | ICD-10-CM | POA: Diagnosis not present

## 2017-07-05 DIAGNOSIS — E2740 Unspecified adrenocortical insufficiency: Secondary | ICD-10-CM | POA: Diagnosis not present

## 2017-07-09 DIAGNOSIS — M545 Low back pain: Secondary | ICD-10-CM | POA: Diagnosis not present

## 2017-07-09 DIAGNOSIS — F339 Major depressive disorder, recurrent, unspecified: Secondary | ICD-10-CM | POA: Diagnosis not present

## 2017-07-11 ENCOUNTER — Other Ambulatory Visit: Payer: Self-pay | Admitting: Radiation Therapy

## 2017-07-11 DIAGNOSIS — D352 Benign neoplasm of pituitary gland: Secondary | ICD-10-CM

## 2017-07-12 DIAGNOSIS — I5033 Acute on chronic diastolic (congestive) heart failure: Secondary | ICD-10-CM | POA: Diagnosis not present

## 2017-07-12 DIAGNOSIS — T889XXS Complication of surgical and medical care, unspecified, sequela: Secondary | ICD-10-CM | POA: Diagnosis not present

## 2017-07-12 DIAGNOSIS — I509 Heart failure, unspecified: Secondary | ICD-10-CM | POA: Diagnosis not present

## 2017-07-12 DIAGNOSIS — G4733 Obstructive sleep apnea (adult) (pediatric): Secondary | ICD-10-CM | POA: Diagnosis not present

## 2017-08-02 DIAGNOSIS — G4733 Obstructive sleep apnea (adult) (pediatric): Secondary | ICD-10-CM | POA: Diagnosis not present

## 2017-08-02 DIAGNOSIS — E240 Pituitary-dependent Cushing's disease: Secondary | ICD-10-CM | POA: Diagnosis not present

## 2017-08-02 DIAGNOSIS — M545 Low back pain: Secondary | ICD-10-CM | POA: Diagnosis not present

## 2017-08-02 DIAGNOSIS — E662 Morbid (severe) obesity with alveolar hypoventilation: Secondary | ICD-10-CM | POA: Diagnosis not present

## 2017-08-09 DIAGNOSIS — T889XXS Complication of surgical and medical care, unspecified, sequela: Secondary | ICD-10-CM | POA: Diagnosis not present

## 2017-08-09 DIAGNOSIS — G4733 Obstructive sleep apnea (adult) (pediatric): Secondary | ICD-10-CM | POA: Diagnosis not present

## 2017-08-09 DIAGNOSIS — I5033 Acute on chronic diastolic (congestive) heart failure: Secondary | ICD-10-CM | POA: Diagnosis not present

## 2017-08-09 DIAGNOSIS — I509 Heart failure, unspecified: Secondary | ICD-10-CM | POA: Diagnosis not present

## 2017-08-20 DIAGNOSIS — G4733 Obstructive sleep apnea (adult) (pediatric): Secondary | ICD-10-CM | POA: Diagnosis not present

## 2017-08-20 DIAGNOSIS — I5033 Acute on chronic diastolic (congestive) heart failure: Secondary | ICD-10-CM | POA: Diagnosis not present

## 2017-08-20 DIAGNOSIS — I509 Heart failure, unspecified: Secondary | ICD-10-CM | POA: Diagnosis not present

## 2017-08-20 DIAGNOSIS — T889XXS Complication of surgical and medical care, unspecified, sequela: Secondary | ICD-10-CM | POA: Diagnosis not present

## 2017-08-24 ENCOUNTER — Ambulatory Visit
Admission: RE | Admit: 2017-08-24 | Discharge: 2017-08-24 | Disposition: A | Discharge status: Home / Self Care | Payer: Medicare HMO | Source: Ambulatory Visit | Attending: Radiation Oncology | Admitting: Radiation Oncology

## 2017-08-24 DIAGNOSIS — D352 Benign neoplasm of pituitary gland: Secondary | ICD-10-CM

## 2017-08-24 MED ORDER — GADOBENATE DIMEGLUMINE 529 MG/ML IV SOLN
9.0000 mL | Freq: Once | INTRAVENOUS | Status: AC | PRN
  Administered 2017-08-24: 9 mL via INTRAVENOUS

## 2017-08-29 ENCOUNTER — Inpatient Hospital Stay: Payer: Medicare HMO | Attending: Internal Medicine | Admitting: Internal Medicine

## 2017-08-29 ENCOUNTER — Encounter: Payer: Self-pay | Admitting: Internal Medicine

## 2017-08-29 ENCOUNTER — Telehealth: Payer: Self-pay

## 2017-08-29 VITALS — BP 160/100 | HR 73 | Temp 97.9°F | Resp 19

## 2017-08-29 DIAGNOSIS — D352 Benign neoplasm of pituitary gland: Secondary | ICD-10-CM

## 2017-08-29 DIAGNOSIS — R52 Pain, unspecified: Secondary | ICD-10-CM | POA: Diagnosis not present

## 2017-08-29 NOTE — Telephone Encounter (Signed)
Printed avs and calender of upcoming appointment. Per 3/27 los 

## 2017-08-29 NOTE — Progress Notes (Signed)
[facility] [place], [place] [address] [phone]   New Patient Evaluation  Date of Service: 08/29/17 Patient Name: [name] [name] Patient MRN: [id number] Patient DOB: [birth year] Provider: Ventura Sellers, MD  Identifying Statement:  [name] [name] is a 52 y.o. 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    Referring Provider: [name], MD [place], [place] [address]  Oncologic History: 2015 - debulking resection with Dr. Joya Salm 10/06/2014-11/17/2014: The pituitary adenoma was treated to 52.2 Gy in 27 fractions.  History of Present Illness: The patient's records from the referring physician were obtained and reviewed and the patient interviewed to confirm this HPI.  [name] [name] presents today for routine follow up for his ACTH-secreting macroadenoma.  He describes no new or progressive neurologic symptoms.  His vision is normal at this time.  He does continue to struggle with appetite and weight loss, understanding that these issues are tied in with his pituitary dysfunction.  He follows closely with endocrinology, taking hydrocortisone and levothyroxine hormone therapy.  He is still non-ambulatory because of orthopedic pain and obesity.    Medications: Current Outpatient Medications on File Prior to Visit  Medication Sig Dispense Refill  . allopurinol (ZYLOPRIM) 300 MG tablet Take 300 mg by mouth daily.    Marland Kitchen ALPRAZolam (XANAX) 0.25 MG tablet Take 1 tablet (0.25 mg total) by mouth 3 (three) times daily as needed for anxiety or sleep (and QHS prn insomnia). 60 tablet 0  . buPROPion (WELLBUTRIN XL) 300 MG 24 hr tablet Take 300 mg by mouth daily.    . carboxymethylcellulose (REFRESH TEARS) 0.5 % SOLN Place 2 drops into both eyes daily as needed (for dry eyes).    . cyclobenzaprine (FLEXERIL) 10 MG tablet Take 10 mg by mouth daily as needed for  muscle spasms.    . diclofenac sodium (VOLTAREN) 1 % GEL Apply 4 g topically daily as needed (pain).    . Diclofenac-Misoprostol (ARTHROTEC) 75-0.2 MG TBEC Take 1 tablet by mouth 2 (two) times daily.     Marland Kitchen HYDROcodone-acetaminophen (NORCO) 7.5-325 MG per tablet Take 1 tablet by mouth every 6 (six) hours as needed for moderate pain.    Marland Kitchen levothyroxine (SYNTHROID, LEVOTHROID) 75 MCG tablet Take 75 mcg by mouth daily before breakfast.    . LORazepam (ATIVAN) 0.5 MG tablet Take one-two tabs po 30 minutes prior to MRI scans 30 tablet 0  . sertraline (ZOLOFT) 100 MG tablet Take 200 mg by mouth daily.    Marland Kitchen zolpidem (AMBIEN) 10 MG tablet Take 1 tablet (10 mg total) by mouth at bedtime as needed for sleep. 30 tablet 5   No current facility-administered medications on file prior to visit.     Allergies:  Allergies  Allergen Reactions  . Morphine Hives, Itching and Rash  . Oxycodone Anaphylaxis and Itching  . Penicillins Anaphylaxis and Other (See Comments)    Unknown reaction  . Buprenorphine Hcl Itching and Hives  . Morphine And Related Hives and Itching   Past Medical History:  Past Medical History:  Diagnosis Date  . Allergic rhinitis   . Anxiety   . Arthritis   . Depression   . Diastolic CHF ([place])   . Gout   . H/O acute respiratory failure   . Hypertension   . Morbid obesity ([place])   . Nephrolithiasis   . Peripheral neuropathy   . Pituitary tumor   . PONV (postoperative  nausea and vomiting)   . Sleep apnea    uses bi-pap   Past Surgical History:  Past Surgical History:  Procedure Laterality Date  . ABDOMINAL SURGERY  06/2013   for cyst with MRSA  . ACHILLES TENDON REPAIR Bilateral   . APPLICATION OF A-CELL OF CHEST/ABDOMEN N/A 01/26/2014   Procedure: PLACEMENT OF A CELL AND VAC ;  Surgeon: [name], DO;  Location: [facility];  Service: Plastics;  Laterality: N/A;  . APPLICATION OF A-CELL OF CHEST/ABDOMEN N/A 02/02/2014   Procedure: WITH PLACEMENT OF A CELL AND VAC ;  Surgeon:  [name], DO;  Location: [facility];  Service: Plastics;  Laterality: N/A;  . BRAIN SURGERY    . COLON RESECTION    . CRANIOTOMY N/A 12/10/2013   Procedure: Transpheniodal resection of Pituitary tumor with Dr. Radene Journey for approach;  Surgeon: Floyce Stakes, MD;  Location: [facility];  Service: Neurosurgery;  Laterality: N/A;  Transpheniodal resection of Pituitary tumor with Dr. Radene Journey for approach  . HERNIA REPAIR    . INCISION AND DRAINAGE OF WOUND N/A 01/26/2014   Procedure: IRRIGATION AND DEBRIDEMENT ABDOMINAL WOUND WITH ;  Surgeon: [name], DO;  Location: [facility];  Service: Plastics;  Laterality: N/A;  . INCISION AND DRAINAGE OF WOUND N/A 02/02/2014   Procedure: IRRIGATION AND DEBRIDEMENT ABDOMINAL WOUND;  Surgeon: [name], DO;  Location: [facility];  Service: Plastics;  Laterality: N/A;  . INCISION AND DRAINAGE OF WOUND N/A 02/12/2014   Procedure: IRRIGATION AND DEBRIDEMENT OF ABDOMINAL WOUND/PLACEMENT OF A-CELL AND VAC;  Surgeon: [name], DO;  Location: [facility];  Service: Plastics;  Laterality: N/A;  . INCISION AND DRAINAGE OF WOUND N/A 04/01/2014   Procedure: IRRIGATION AND DEBRIDEMENT ABDOMINAL WOUND WITH PLACEMENT OF ACELL/VAC;  Surgeon: [name], DO;  Location: [facility];  Service: Plastics;  Laterality: N/A;  . Sigmoid Colonoscopy    . TRANSNASAL APPROACH N/A 12/10/2013   Procedure: TRANSNASAL APPROACH;  Surgeon: Rozetta Nunnery, MD;  Location: [facility] NEURO ORS;  Service: ENT;  Laterality: N/A;   Social History:  Social History   Socioeconomic History  . Marital status: Single    Spouse name: Not on file  . Number of children: Not on file  . Years of education: Not on file  . Highest education level: Not on file  Occupational History  . Not on file  Social Needs  . Financial resource strain: Not on file  . Food insecurity:    Worry: Not on file    Inability: Not on file  . Transportation needs:    Medical: Not on file    Non-medical: Not on file    Tobacco Use  . Smoking status: Never Smoker  . Smokeless tobacco: Never Used  Substance and Sexual Activity  . Alcohol use: No  . Drug use: No  . Sexual activity: Never  Lifestyle  . Physical activity:    Days per week: Not on file    Minutes per session: Not on file  . Stress: Not on file  Relationships  . Social connections:    Talks on phone: Not on file    Gets together: Not on file    Attends religious service: Not on file    Active member of club or organization: Not on file    Attends meetings of clubs or organizations: Not on file    Relationship status: Not on file  . Intimate partner violence:    Fear of current or  ex partner: Not on file    Emotionally abused: Not on file    Physically abused: Not on file    Forced sexual activity: Not on file  Other Topics Concern  . Not on file  Social History Narrative  . Not on file   Family History:  Family History  Problem Relation Age of Onset  . Hypertension Mother   . Hypertension Father   . Gout Father   . Hypothyroidism Father   . Gout Brother   . Cancer Maternal Aunt        colon  . Cancer Maternal Grandmother        breast    Review of Systems: Constitutional: Denies fevers, chills or abnormal weight loss Eyes: Denies blurriness of vision Ears, nose, mouth, throat, and face: Denies mucositis or sore throat Respiratory: Denies cough, dyspnea or wheezes Cardiovascular: Denies palpitation, chest discomfort or lower extremity swelling Gastrointestinal:  Denies nausea, constipation, diarrhea GU: Denies dysuria or incontinence Skin: Denies abnormal skin rashes Neurological: Per HPI Musculoskeletal: +joint pain Behavioral/Psych: Denies anxiety, disturbance in thought content, and mood instability  Physical Exam: Vitals:   08/29/17 1212  BP: (!) 160/100  Pulse: 73  Resp: 19  Temp: 97.9 F (36.6 C)  SpO2: 98%   KPS: 60. General: Alert, cooperative, pleasant, in no acute distress.  In  wheelchair. Head: Craniotomy scar noted, dry and intact. EENT: No conjunctival injection or scleral icterus. Oral mucosa moist Lungs: Resp effort normal Cardiac: Regular rate and rhythm Abdomen: Soft, non-distended abdomen Skin: No rashes cyanosis or petechiae. Extremities: No clubbing or edema  Neurologic Exam: Mental Status: Awake, alert, attentive to examiner. Oriented to self and environment. Language is fluent with intact comprehension.  Cranial Nerves: Visual acuity is grossly normal. Visual fields are full. Extra-ocular movements intact. No ptosis. Face is symmetric, tongue midline. Motor: Tone and bulk are normal. Power is full in both arms and legs. Reflexes are symmetric, no pathologic reflexes present. Intact finger to nose bilaterally Sensory: Intact to light touch and temperature Gait: Deferred  Labs: I have reviewed the data as listed    Component Value Date/Time   NA 143 04/01/2014 1147   K 4.0 04/01/2014 1147   CL 107 04/01/2014 1147   CO2 23 04/01/2014 1147   GLUCOSE 96 04/01/2014 1147   BUN 21 04/01/2014 1147   CREATININE 0.93 08/28/2014 1059   CALCIUM 9.2 04/01/2014 1147   PROT 7.1 12/06/2013 0855   ALBUMIN 3.9 12/06/2013 0855   AST 23 12/06/2013 0855   ALT 35 12/06/2013 0855   ALKPHOS 100 12/06/2013 0855   BILITOT 0.4 12/06/2013 0855   GFRNONAA >90 08/28/2014 1059   GFRAA >90 08/28/2014 1059   Lab Results  Component Value Date   WBC 6.4 04/01/2014   NEUTROABS 6.7 12/08/2013   HGB 13.1 04/01/2014   HCT 41.6 04/01/2014   MCV 89.1 04/01/2014   PLT 157 04/01/2014    Imaging: [facility] Clinician Interpretation: I have personally reviewed the CNS images as listed.  My interpretation, in the context of the patient's clinical presentation, is stable disease  [name]  Result Date: 08/24/2017 CLINICAL DATA:  Benign neoplasm pituitary gland. Post surgery and radiation. Creatinine was obtained on site at [facility] at [address]  Results: Creatinine 0.9 mg/dL. EXAM: MRI HEAD WITHOUT AND WITH CONTRAST TECHNIQUE: Multiplanar, multiecho pulse sequences of the brain and surrounding structures were obtained without and with intravenous contrast. CONTRAST:  74mL MULTIHANCE GADOBENATE DIMEGLUMINE  529 MG/ML IV SOLN COMPARISON:  MRI 02/26/2017, 04/11/2016 FINDINGS: Brain: Sella is enlarged. Infundibulum deviated to the left. Small amount of residual pituitary tissue on the left is unchanged and not enlarged Hypoenhancing nodule in the right cavernous sinus has progressed in the interval. This measures approximately 5 x 11 mm. This was not present on 04/11/2016 and has enlarged since 02/26/2017. Previously noted enhancing tumor in the right lateral cavernous sinus has resolved. Ventricle size normal. No acute infarct or hemorrhage. Mild chronic white matter changes. Mild chronic hyperintensity in the right pons unchanged. No other lesions identified. Vascular: Normal arterial flow voids Skull and upper cervical spine: Negative Sinuses/Orbits: Negative Other: None IMPRESSION: Postop trans-sphenoidal hypoxic ectomy and radiation to the sella. The sella is enlarged and largely filled with CSF. Small residual pituitary gland on the left is unchanged Compared with prior studies, progression of nonenhancing nodule in the right medial cavernous sinus now measuring approximately 5 x 11 mm. This is concerning for recurrent tumor. Clot in the cavernous sinus also possible given the lack of enhancement. Electronically Signed   By: Franchot Gallo M.D.   On: 08/24/2017 13:36    Pathology:  Assessment/Plan 1. Pituitary adenoma [facility])  We appreciate the opportunity to participate in the care of [name] [name].  He is clinically stable today.  His MRI was reviewed in detail at brain tumor board this morning.  Despite very small enhancement this is considered a stable study.    He will continue to follow closely with Endocrinology.  He should follow up with  [place] in 6 months with an MRI brain for review.  We spent twenty additional minutes teaching regarding the natural history, biology, and historical experience in the treatment of brain tumors. We then discussed in detail the current recommendations for therapy focusing on the mode of administration, mechanism of action, anticipated toxicities, and quality of life issues associated with this plan. We also provided teaching sheets for the patient to take home as an additional resource.  All questions were answered. The patient knows to call the clinic with any problems, questions or concerns, including any new neurologic deficits. No barriers to learning were detected.  The total time spent in the encounter was 45 minutes and more than 50% was on counseling and review of test results   Ventura Sellers, MD Medical Director of Neuro-Oncology [facility] at [place] 08/29/17 12:05 PM

## 2017-09-09 DIAGNOSIS — I509 Heart failure, unspecified: Secondary | ICD-10-CM | POA: Diagnosis not present

## 2017-09-09 DIAGNOSIS — I5033 Acute on chronic diastolic (congestive) heart failure: Secondary | ICD-10-CM | POA: Diagnosis not present

## 2017-09-09 DIAGNOSIS — G4733 Obstructive sleep apnea (adult) (pediatric): Secondary | ICD-10-CM | POA: Diagnosis not present

## 2017-09-09 DIAGNOSIS — T889XXS Complication of surgical and medical care, unspecified, sequela: Secondary | ICD-10-CM | POA: Diagnosis not present

## 2017-09-27 DIAGNOSIS — G8929 Other chronic pain: Secondary | ICD-10-CM | POA: Diagnosis not present

## 2017-09-27 DIAGNOSIS — G4733 Obstructive sleep apnea (adult) (pediatric): Secondary | ICD-10-CM | POA: Diagnosis not present

## 2017-09-27 DIAGNOSIS — M545 Low back pain: Secondary | ICD-10-CM | POA: Diagnosis not present

## 2017-09-27 DIAGNOSIS — J302 Other seasonal allergic rhinitis: Secondary | ICD-10-CM | POA: Diagnosis not present

## 2017-10-03 DIAGNOSIS — T889XXS Complication of surgical and medical care, unspecified, sequela: Secondary | ICD-10-CM | POA: Diagnosis not present

## 2017-10-03 DIAGNOSIS — I509 Heart failure, unspecified: Secondary | ICD-10-CM | POA: Diagnosis not present

## 2017-10-03 DIAGNOSIS — G4733 Obstructive sleep apnea (adult) (pediatric): Secondary | ICD-10-CM | POA: Diagnosis not present

## 2017-10-03 DIAGNOSIS — I5033 Acute on chronic diastolic (congestive) heart failure: Secondary | ICD-10-CM | POA: Diagnosis not present

## 2017-10-09 DIAGNOSIS — I509 Heart failure, unspecified: Secondary | ICD-10-CM | POA: Diagnosis not present

## 2017-10-09 DIAGNOSIS — T889XXS Complication of surgical and medical care, unspecified, sequela: Secondary | ICD-10-CM | POA: Diagnosis not present

## 2017-10-09 DIAGNOSIS — G4733 Obstructive sleep apnea (adult) (pediatric): Secondary | ICD-10-CM | POA: Diagnosis not present

## 2017-10-09 DIAGNOSIS — I5033 Acute on chronic diastolic (congestive) heart failure: Secondary | ICD-10-CM | POA: Diagnosis not present

## 2017-11-09 DIAGNOSIS — T889XXS Complication of surgical and medical care, unspecified, sequela: Secondary | ICD-10-CM | POA: Diagnosis not present

## 2017-11-09 DIAGNOSIS — I509 Heart failure, unspecified: Secondary | ICD-10-CM | POA: Diagnosis not present

## 2017-11-09 DIAGNOSIS — I5033 Acute on chronic diastolic (congestive) heart failure: Secondary | ICD-10-CM | POA: Diagnosis not present

## 2017-11-09 DIAGNOSIS — G4733 Obstructive sleep apnea (adult) (pediatric): Secondary | ICD-10-CM | POA: Diagnosis not present

## 2017-11-27 DIAGNOSIS — G4733 Obstructive sleep apnea (adult) (pediatric): Secondary | ICD-10-CM | POA: Diagnosis not present

## 2017-11-27 DIAGNOSIS — E662 Morbid (severe) obesity with alveolar hypoventilation: Secondary | ICD-10-CM | POA: Diagnosis not present

## 2017-11-27 DIAGNOSIS — E240 Pituitary-dependent Cushing's disease: Secondary | ICD-10-CM | POA: Diagnosis not present

## 2017-11-27 DIAGNOSIS — M545 Low back pain: Secondary | ICD-10-CM | POA: Diagnosis not present

## 2017-12-09 DIAGNOSIS — I5033 Acute on chronic diastolic (congestive) heart failure: Secondary | ICD-10-CM | POA: Diagnosis not present

## 2017-12-09 DIAGNOSIS — T889XXS Complication of surgical and medical care, unspecified, sequela: Secondary | ICD-10-CM | POA: Diagnosis not present

## 2017-12-09 DIAGNOSIS — G4733 Obstructive sleep apnea (adult) (pediatric): Secondary | ICD-10-CM | POA: Diagnosis not present

## 2017-12-09 DIAGNOSIS — I509 Heart failure, unspecified: Secondary | ICD-10-CM | POA: Diagnosis not present

## 2017-12-13 DIAGNOSIS — R7303 Prediabetes: Secondary | ICD-10-CM | POA: Diagnosis not present

## 2017-12-13 DIAGNOSIS — Z125 Encounter for screening for malignant neoplasm of prostate: Secondary | ICD-10-CM | POA: Diagnosis not present

## 2017-12-13 DIAGNOSIS — G629 Polyneuropathy, unspecified: Secondary | ICD-10-CM | POA: Diagnosis not present

## 2017-12-13 DIAGNOSIS — D696 Thrombocytopenia, unspecified: Secondary | ICD-10-CM | POA: Diagnosis not present

## 2017-12-13 DIAGNOSIS — E2740 Unspecified adrenocortical insufficiency: Secondary | ICD-10-CM | POA: Diagnosis not present

## 2017-12-13 DIAGNOSIS — I10 Essential (primary) hypertension: Secondary | ICD-10-CM | POA: Diagnosis not present

## 2017-12-13 DIAGNOSIS — D497 Neoplasm of unspecified behavior of endocrine glands and other parts of nervous system: Secondary | ICD-10-CM | POA: Diagnosis not present

## 2017-12-18 DIAGNOSIS — G629 Polyneuropathy, unspecified: Secondary | ICD-10-CM | POA: Diagnosis not present

## 2017-12-18 DIAGNOSIS — F339 Major depressive disorder, recurrent, unspecified: Secondary | ICD-10-CM | POA: Diagnosis not present

## 2017-12-18 DIAGNOSIS — N200 Calculus of kidney: Secondary | ICD-10-CM | POA: Diagnosis not present

## 2017-12-18 DIAGNOSIS — E230 Hypopituitarism: Secondary | ICD-10-CM | POA: Diagnosis not present

## 2017-12-18 DIAGNOSIS — Z0001 Encounter for general adult medical examination with abnormal findings: Secondary | ICD-10-CM | POA: Diagnosis not present

## 2017-12-18 DIAGNOSIS — D696 Thrombocytopenia, unspecified: Secondary | ICD-10-CM | POA: Diagnosis not present

## 2017-12-18 DIAGNOSIS — I5030 Unspecified diastolic (congestive) heart failure: Secondary | ICD-10-CM | POA: Diagnosis not present

## 2017-12-18 DIAGNOSIS — I10 Essential (primary) hypertension: Secondary | ICD-10-CM | POA: Diagnosis not present

## 2017-12-20 ENCOUNTER — Other Ambulatory Visit: Payer: Self-pay | Admitting: *Deleted

## 2017-12-20 DIAGNOSIS — D352 Benign neoplasm of pituitary gland: Secondary | ICD-10-CM

## 2017-12-31 ENCOUNTER — Telehealth: Payer: Self-pay | Admitting: *Deleted

## 2017-12-31 NOTE — Telephone Encounter (Signed)
Called patient lm for returned call.  Upon scheduling MRI of Brain at [facility] they questioned if patient was ambulatory.  They are currently without a [place] lift and wouldn't be able to scan patient if he is not ambulatory.  Would need to reschedule to [facility].  Pending call back.

## 2018-01-01 DIAGNOSIS — I10 Essential (primary) hypertension: Secondary | ICD-10-CM | POA: Diagnosis not present

## 2018-01-01 DIAGNOSIS — E230 Hypopituitarism: Secondary | ICD-10-CM | POA: Diagnosis not present

## 2018-01-01 DIAGNOSIS — E2740 Unspecified adrenocortical insufficiency: Secondary | ICD-10-CM | POA: Diagnosis not present

## 2018-01-01 DIAGNOSIS — E038 Other specified hypothyroidism: Secondary | ICD-10-CM | POA: Diagnosis not present

## 2018-01-01 DIAGNOSIS — R7303 Prediabetes: Secondary | ICD-10-CM | POA: Diagnosis not present

## 2018-01-07 ENCOUNTER — Other Ambulatory Visit: Payer: Self-pay | Admitting: Internal Medicine

## 2018-01-07 MED ORDER — LORAZEPAM 0.5 MG PO TABS: ORAL_TABLET | ORAL | 0 refills | Status: DC

## 2018-01-08 DIAGNOSIS — H6983 Other specified disorders of Eustachian tube, bilateral: Secondary | ICD-10-CM | POA: Diagnosis not present

## 2018-01-08 DIAGNOSIS — S0340XA Sprain of jaw, unspecified side, initial encounter: Secondary | ICD-10-CM | POA: Diagnosis not present

## 2018-01-08 DIAGNOSIS — D490 Neoplasm of unspecified behavior of digestive system: Secondary | ICD-10-CM | POA: Diagnosis not present

## 2018-01-09 DIAGNOSIS — G4733 Obstructive sleep apnea (adult) (pediatric): Secondary | ICD-10-CM | POA: Diagnosis not present

## 2018-01-09 DIAGNOSIS — T889XXS Complication of surgical and medical care, unspecified, sequela: Secondary | ICD-10-CM | POA: Diagnosis not present

## 2018-01-09 DIAGNOSIS — I509 Heart failure, unspecified: Secondary | ICD-10-CM | POA: Diagnosis not present

## 2018-01-09 DIAGNOSIS — I5033 Acute on chronic diastolic (congestive) heart failure: Secondary | ICD-10-CM | POA: Diagnosis not present

## 2018-01-11 DIAGNOSIS — L03818 Cellulitis of other sites: Secondary | ICD-10-CM | POA: Diagnosis not present

## 2018-01-16 DIAGNOSIS — L03818 Cellulitis of other sites: Secondary | ICD-10-CM | POA: Diagnosis not present

## 2018-01-23 DIAGNOSIS — D101 Benign neoplasm of tongue: Secondary | ICD-10-CM | POA: Diagnosis not present

## 2018-02-09 DIAGNOSIS — I5033 Acute on chronic diastolic (congestive) heart failure: Secondary | ICD-10-CM | POA: Diagnosis not present

## 2018-02-09 DIAGNOSIS — I509 Heart failure, unspecified: Secondary | ICD-10-CM | POA: Diagnosis not present

## 2018-02-09 DIAGNOSIS — T889XXS Complication of surgical and medical care, unspecified, sequela: Secondary | ICD-10-CM | POA: Diagnosis not present

## 2018-02-09 DIAGNOSIS — G4733 Obstructive sleep apnea (adult) (pediatric): Secondary | ICD-10-CM | POA: Diagnosis not present

## 2018-02-21 ENCOUNTER — Other Ambulatory Visit: Payer: Self-pay | Admitting: Radiation Therapy

## 2018-02-25 ENCOUNTER — Ambulatory Visit
Admission: RE | Admit: 2018-02-25 | Discharge: 2018-02-25 | Disposition: A | Discharge status: Home / Self Care | Payer: Medicare HMO | Source: Ambulatory Visit | Attending: Internal Medicine | Admitting: Internal Medicine

## 2018-02-25 DIAGNOSIS — D497 Neoplasm of unspecified behavior of endocrine glands and other parts of nervous system: Secondary | ICD-10-CM | POA: Diagnosis not present

## 2018-02-25 DIAGNOSIS — D352 Benign neoplasm of pituitary gland: Secondary | ICD-10-CM

## 2018-02-25 MED ORDER — GADOBENATE DIMEGLUMINE 529 MG/ML IV SOLN
10.0000 mL | Freq: Once | INTRAVENOUS | Status: AC | PRN
  Administered 2018-02-25: 10 mL via INTRAVENOUS

## 2018-02-27 ENCOUNTER — Encounter: Payer: Self-pay | Admitting: Internal Medicine

## 2018-02-27 ENCOUNTER — Inpatient Hospital Stay: Payer: Medicare HMO | Attending: Internal Medicine | Admitting: Internal Medicine

## 2018-02-27 ENCOUNTER — Telehealth: Payer: Self-pay | Admitting: Internal Medicine

## 2018-02-27 ENCOUNTER — Inpatient Hospital Stay: Payer: Medicare HMO

## 2018-02-27 VITALS — BP 143/101 | HR 80 | Temp 98.2°F | Resp 18 | Ht 73.0 in | Wt >= 6400 oz

## 2018-02-27 DIAGNOSIS — I10 Essential (primary) hypertension: Secondary | ICD-10-CM

## 2018-02-27 DIAGNOSIS — G629 Polyneuropathy, unspecified: Secondary | ICD-10-CM | POA: Diagnosis not present

## 2018-02-27 DIAGNOSIS — F329 Major depressive disorder, single episode, unspecified: Secondary | ICD-10-CM

## 2018-02-27 DIAGNOSIS — D352 Benign neoplasm of pituitary gland: Secondary | ICD-10-CM | POA: Diagnosis not present

## 2018-02-27 DIAGNOSIS — I110 Hypertensive heart disease with heart failure: Secondary | ICD-10-CM | POA: Diagnosis not present

## 2018-02-27 DIAGNOSIS — F419 Anxiety disorder, unspecified: Secondary | ICD-10-CM | POA: Insufficient documentation

## 2018-02-27 NOTE — Progress Notes (Signed)
[facility] [place], [place] [address] [phone]   Interval Evaluation  Date of Service: 02/27/18 Patient Name: [name] [name] Patient MRN: [id number] Patient DOB: 1966-01-15 Provider: Ventura Sellers, MD  Identifying Statement:  [name] is a 52 y.o. male with ACTH secreting pituitary macroadenoma   Oncologic History: 2015 - debulking resection with Dr. Joya Salm 10/06/2014-11/17/2014: The pituitary adenoma was treated to 52.2 Gy in 27 fractions.  Interval History:  Braelynn Lybrand presents today for follow up after recent MRI brain.  Following closely with endocrinology, no changes to thyroid or cortisol dosing.  Continues to struggle with weight since pituitary issues arose, plan is now to consider oral serotonin agonist for weight loss assistance.  He otherwise describes no new or progressive neurologic symptoms.   H+P (08/29/17) Patient presents today for routine follow up for his ACTH-secreting macroadenoma.  He describes no new or progressive neurologic symptoms.  His vision is normal at this time.  He does continue to struggle with appetite and weight loss, understanding that these issues are tied in with his pituitary dysfunction.  He follows closely with endocrinology, taking hydrocortisone and levothyroxine hormone therapy.  He is still non-ambulatory because of orthopedic pain and obesity.    Medications: Current Outpatient Medications on File Prior to Visit  Medication Sig Dispense Refill  . allopurinol (ZYLOPRIM) 300 MG tablet Take 300 mg by mouth daily.    Marland Kitchen ALPRAZolam (XANAX) 0.25 MG tablet Take 1 tablet (0.25 mg total) by mouth 3 (three) times daily as needed for anxiety or sleep (and QHS prn insomnia). 60 tablet 0  . buPROPion (WELLBUTRIN XL) 300 MG 24 hr tablet Take 300 mg by mouth daily.    . carboxymethylcellulose (REFRESH TEARS) 0.5 % SOLN Place 2 drops into both eyes daily as needed (for dry eyes).     . cyclobenzaprine (FLEXERIL) 10 MG tablet Take 10 mg by mouth daily as needed for muscle spasms.    . diclofenac sodium (VOLTAREN) 1 % GEL Apply 4 g topically daily as needed (pain).    . Diclofenac-Misoprostol (ARTHROTEC) 75-0.2 MG TBEC Take 1 tablet by mouth 2 (two) times daily.     Marland Kitchen HYDROcodone-acetaminophen (NORCO) 7.5-325 MG per tablet Take 1 tablet by mouth every 6 (six) hours as needed for moderate pain.    . hydrocortisone (CORTEF) 20 MG tablet Take 40 mg by mouth daily.    Marland Kitchen levothyroxine (SYNTHROID, LEVOTHROID) 75 MCG tablet Take 75 mcg by mouth daily before breakfast.    . LORazepam (ATIVAN) 0.5 MG tablet Take one-two tabs po 30 minutes prior to MRI scans 6 tablet 0  . sertraline (ZOLOFT) 100 MG tablet Take 200 mg by mouth daily.    Marland Kitchen zolpidem (AMBIEN) 10 MG tablet Take 1 tablet (10 mg total) by mouth at bedtime as needed for sleep. 30 tablet 5   No current facility-administered medications on file prior to visit.     Allergies:  Allergies  Allergen Reactions  . Morphine Hives, Itching and Rash  . Penicillins Anaphylaxis and Other (See Comments)    Unknown reaction  . Buprenorphine Hcl Itching and Hives  . Morphine And Related Hives and Itching   Past Medical History:  Past Medical History:  Diagnosis Date  . Allergic rhinitis   . Anxiety   . Arthritis   . Depression   . Diastolic CHF ([place])   . Gout   . H/O acute respiratory failure   . Hypertension   .  Morbid obesity ([place])   . Nephrolithiasis   . Peripheral neuropathy   . Pituitary tumor   . PONV (postoperative nausea and vomiting)   . Sleep apnea    uses bi-pap   Past Surgical History:  Past Surgical History:  Procedure Laterality Date  . ABDOMINAL SURGERY  06/2013   for cyst with MRSA  . ACHILLES TENDON REPAIR Bilateral   . APPLICATION OF A-CELL OF CHEST/ABDOMEN N/A 01/26/2014   Procedure: PLACEMENT OF A CELL AND VAC ;  Surgeon: [name], DO;  Location: [facility];  Service: Plastics;  Laterality: N/A;    . APPLICATION OF A-CELL OF CHEST/ABDOMEN N/A 02/02/2014   Procedure: WITH PLACEMENT OF A CELL AND VAC ;  Surgeon: [name], DO;  Location: [facility];  Service: Plastics;  Laterality: N/A;  . BRAIN SURGERY    . COLON RESECTION    . CRANIOTOMY N/A 12/10/2013   Procedure: Transpheniodal resection of Pituitary tumor with Dr. Radene Journey for approach;  Surgeon: Floyce Stakes, MD;  Location[facility]URO [facility];  Service: Neurosurgery;  Laterality: N/A;  Transpheniodal resection of Pituitary tumor with Dr. Radene Journey for approach  . HERNIA REPAIR    . INCISION AND DRAINAGE OF WOUND N/A 01/26/2014   Procedure: IRRIGATION AND DEBRIDEMENT ABDOMINAL WOUND WITH ;  Surgeon: [name], DO;  Location: [facility];  Service: Plastics;  Laterality: N/A;  . INCISION AND DRAINAGE OF WOUND N/A 02/02/2014   Procedure: IRRIGATION AND DEBRIDEMENT ABDOMINAL WOUND;  Surgeon: [name], DO;  Location: [facility];  Service: Plastics;  Laterality: N/A;  . INCISION AND DRAINAGE OF WOUND N/A 02/12/2014   Procedure: IRRIGATION AND DEBRIDEMENT OF ABDOMINAL WOUND/PLACEMENT OF A-CELL AND VAC;  Surgeon: [name], DO;  Location: [facility];  Service: Plastics;  Laterality: N/A;  . INCISION AND DRAINAGE OF WOUND N/A 04/01/2014   Procedure: IRRIGATION AND DEBRIDEMENT ABDOMINAL WOUND WITH PLACEMENT OF ACELL/VAC;  Surgeon: [name], DO;  Location: [facility];  Service: Plastics;  Laterality: N/A;  . Sigmoid Colonoscopy    . TRANSNASAL APPROACH N/A 12/10/2013   Procedure: TRANSNASAL APPROACH;  Surgeon: Rozetta Nunnery, MD;  Location: [facility] NEURO ORS;  Service: ENT;  Laterality: N/A;   Social History:  Social History   Socioeconomic History  . Marital status: Single    Spouse name: Not on file  . Number of children: Not on file  . Years of education: Not on file  . Highest education level: Not on file  Occupational History  . Not on file  Social Needs  . Financial resource strain: Not on file  . Food insecurity:    Worry: Not on  file    Inability: Not on file  . Transportation needs:    Medical: Not on file    Non-medical: Not on file  Tobacco Use  . Smoking status: Never Smoker  . Smokeless tobacco: Never Used  Substance and Sexual Activity  . Alcohol use: No  . Drug use: No  . Sexual activity: Never  Lifestyle  . Physical activity:    Days per week: Not on file    Minutes per session: Not on file  . Stress: Not on file  Relationships  . Social connections:    Talks on phone: Not on file    Gets together: Not on file    Attends religious service: Not on file    Active member of club or organization: Not on file    Attends meetings of clubs or organizations: Not  on file    Relationship status: Not on file  . Intimate partner violence:    Fear of current or ex partner: Not on file    Emotionally abused: Not on file    Physically abused: Not on file    Forced sexual activity: Not on file  Other Topics Concern  . Not on file  Social History Narrative  . Not on file   Family History:  Family History  Problem Relation Age of Onset  . Hypertension Mother   . Hypertension Father   . Gout Father   . Hypothyroidism Father   . Gout Brother   . Cancer Maternal Aunt        colon  . Cancer Maternal Grandmother        breast    Review of Systems: Constitutional: Denies fevers, chills or abnormal weight loss Eyes: Denies blurriness of vision Ears, nose, mouth, throat, and face: Denies mucositis or sore throat Respiratory: Denies cough, dyspnea or wheezes Cardiovascular: Denies palpitation, chest discomfort or lower extremity swelling Gastrointestinal:  Denies nausea, constipation, diarrhea GU: Denies dysuria or incontinence Skin: Denies abnormal skin rashes Neurological: Per HPI Musculoskeletal: +joint pain Behavioral/Psych: Denies anxiety, disturbance in thought content, and mood instability  Physical Exam: Vitals:   02/27/18 1259  BP: (!) 143/101  Pulse: 80  Resp: 18  Temp: 98.2 F  (36.8 C)  SpO2: 97%   KPS: 60. General: Alert, cooperative, pleasant, in no acute distress.  In wheelchair. Head: Craniotomy scar noted, dry and intact. EENT: No conjunctival injection or scleral icterus. Oral mucosa moist Lungs: Resp effort normal Cardiac: Regular rate and rhythm Abdomen: Soft, non-distended abdomen Skin: No rashes cyanosis or petechiae. Extremities: bilateral edema, moderate  Neurologic Exam: Mental Status: Awake, alert, attentive to examiner. Oriented to self and environment. Language is fluent with intact comprehension.  Cranial Nerves: Visual acuity is grossly normal. Visual fields are full. Extra-ocular movements intact. No ptosis. Face is symmetric, tongue midline. Motor: Tone and bulk are normal. Power is full in both arms and legs though limited by habitus. Reflexes are symmetric, no pathologic reflexes present. Intact finger to nose bilaterally Sensory: Intact to light touch and temperature Gait: Deferred  Labs: I have reviewed the data as listed    Component Value Date/Time   NA 143 04/01/2014 1147   K 4.0 04/01/2014 1147   CL 107 04/01/2014 1147   CO2 23 04/01/2014 1147   GLUCOSE 96 04/01/2014 1147   BUN 21 04/01/2014 1147   CREATININE 0.93 08/28/2014 1059   CALCIUM 9.2 04/01/2014 1147   PROT 7.1 12/06/2013 0855   ALBUMIN 3.9 12/06/2013 0855   AST 23 12/06/2013 0855   ALT 35 12/06/2013 0855   ALKPHOS 100 12/06/2013 0855   BILITOT 0.4 12/06/2013 0855   GFRNONAA >90 08/28/2014 1059   GFRAA >90 08/28/2014 1059   Lab Results  Component Value Date   WBC 6.4 04/01/2014   NEUTROABS 6.7 12/08/2013   HGB 13.1 04/01/2014   HCT 41.6 04/01/2014   MCV 89.1 04/01/2014   PLT 157 04/01/2014    Imaging: [facility] Clinician Interpretation: I have personally reviewed the CNS images as listed.  My interpretation, in the context of the patient's clinical presentation, is stable disease  [name]  Result Date: 02/25/2018 CLINICAL DATA:   Followup pituitary tumor status post surgery and radiation in 2015. Creatinine was obtained on site at [facility] at [address] Results: Creatinine 1.2 mg/dL. EXAM: MRI HEAD WITHOUT  AND WITH CONTRAST TECHNIQUE: Multiplanar, multiecho pulse sequences of the brain and surrounding structures were obtained without and with intravenous contrast. CONTRAST:  62mL MULTIHANCE GADOBENATE DIMEGLUMINE 529 MG/ML IV SOLN COMPARISON:  Brain MRI 08/24/2017, 02/26/2017. FINDINGS: BRAIN: There is no acute infarct, acute hemorrhage or mass effect. There are no old infarcts. Multifocal white matter hyperintensity, most commonly due to chronic ischemic microangiopathy. The CSF spaces are normal for age, with no hydrocephalus. Susceptibility-sensitive sequences show no chronic microhemorrhage or superficial siderosis. Pituitary/Sella: Status post trans-sphenoidal pituitary resection. The sella remains expanded and predominantly CSF filled. There is residual pituitary gland along the left aspect of the sellar floor. The infundibulum is deviated to the left, unchanged. The hypothalamus and mamillary bodies are normal. There is no mass effect on the optic chiasm or optic nerves. The infundibular and chiasmatic recesses are clear. Normal cavernous sinus and cavernous internal carotid artery flow voids. Rounded, nonenhancing focus in the inferior right cavernous sinus is unchanged (series 20 image 5; series 21, image 5). VASCULAR: Major intracranial arterial and venous sinus flow voids are preserved. SKULL AND UPPER CERVICAL SPINE: The visualized skull base, calvarium, upper cervical spine and extracranial soft tissues are normal. SINUSES/ORBITS: No fluid levels or advanced mucosal thickening. No mastoid or middle ear effusion. The orbits are normal. IMPRESSION: Unchanged postoperative appearance of the sella turcica, with residual pituitary gland along the left sellar floor and persistent leftward deviation of the  infundibulum. Rounded focus of hypoenhancement in the inferior right cavernous sinus is unchanged. Electronically Signed   By: Ulyses Jarred M.D.   On: 02/25/2018 13:26    Pathology:  Assessment/Plan 1. Pituitary adenoma [facility])  We appreciate the opportunity to participate in the care of Alyxander Petrella.  He is clinically and radiographically stable today.    He will continue to follow closely with Endocrinology.  He should follow up with [place] in 9 months with an MRI brain for review.  All questions were answered. The patient knows to call the clinic with any problems, questions or concerns, including any new neurologic deficits. No barriers to learning were detected.  The total time spent in the encounter was 25 minutes and more than 50% was on counseling and review of test results   Ventura Sellers, MD Medical Director of Neuro-Oncology [facility] 02/27/18 12:52 PM

## 2018-02-27 NOTE — Telephone Encounter (Signed)
Scheduled appt per 9/25 los - pt os aware of appt date and time.

## 2018-03-01 NOTE — Progress Notes (Signed)
Brain and Spine Tumor Board Documentation  Lonnie Schramm was presented by Cecil Cobbs, MD at Brain and Spine Tumor Board on 03/01/2018, which included representatives from neuro oncology, radiation oncology, surgical oncology, radiology, pathology, navigation.  Shantel was presented as a current patient with history of the following treatments:  .  Additionally, we reviewed previous medical and familial history, history of present illness, and recent lab results along with all available histopathologic and imaging studies. The tumor board considered available treatment options and made the following recommendations:  Active surveillance  Tumor board is a meeting of clinicians from various specialty areas who evaluate and discuss patients for whom a multidisciplinary approach is being considered. Final determinations in the plan of care are those of the provider(s). The responsibility for follow up of recommendations given during tumor board is that of the provider.   Today's extended care, comprehensive team conference, Jermon was not present for the discussion and was not examined.

## 2018-03-05 DIAGNOSIS — T889XXS Complication of surgical and medical care, unspecified, sequela: Secondary | ICD-10-CM | POA: Diagnosis not present

## 2018-03-05 DIAGNOSIS — I5033 Acute on chronic diastolic (congestive) heart failure: Secondary | ICD-10-CM | POA: Diagnosis not present

## 2018-03-05 DIAGNOSIS — G4733 Obstructive sleep apnea (adult) (pediatric): Secondary | ICD-10-CM | POA: Diagnosis not present

## 2018-03-05 DIAGNOSIS — I509 Heart failure, unspecified: Secondary | ICD-10-CM | POA: Diagnosis not present

## 2018-03-11 DIAGNOSIS — T889XXS Complication of surgical and medical care, unspecified, sequela: Secondary | ICD-10-CM | POA: Diagnosis not present

## 2018-03-11 DIAGNOSIS — I509 Heart failure, unspecified: Secondary | ICD-10-CM | POA: Diagnosis not present

## 2018-03-11 DIAGNOSIS — I5033 Acute on chronic diastolic (congestive) heart failure: Secondary | ICD-10-CM | POA: Diagnosis not present

## 2018-03-11 DIAGNOSIS — G4733 Obstructive sleep apnea (adult) (pediatric): Secondary | ICD-10-CM | POA: Diagnosis not present

## 2018-04-11 DIAGNOSIS — I5033 Acute on chronic diastolic (congestive) heart failure: Secondary | ICD-10-CM | POA: Diagnosis not present

## 2018-04-11 DIAGNOSIS — T889XXS Complication of surgical and medical care, unspecified, sequela: Secondary | ICD-10-CM | POA: Diagnosis not present

## 2018-04-11 DIAGNOSIS — G4733 Obstructive sleep apnea (adult) (pediatric): Secondary | ICD-10-CM | POA: Diagnosis not present

## 2018-04-11 DIAGNOSIS — I509 Heart failure, unspecified: Secondary | ICD-10-CM | POA: Diagnosis not present

## 2018-04-18 ENCOUNTER — Other Ambulatory Visit: Payer: Self-pay | Admitting: *Deleted

## 2018-04-19 DIAGNOSIS — M79661 Pain in right lower leg: Secondary | ICD-10-CM | POA: Diagnosis not present

## 2018-04-19 DIAGNOSIS — R600 Localized edema: Secondary | ICD-10-CM | POA: Diagnosis not present

## 2018-04-24 DIAGNOSIS — G4733 Obstructive sleep apnea (adult) (pediatric): Secondary | ICD-10-CM | POA: Diagnosis not present

## 2018-04-24 DIAGNOSIS — R7303 Prediabetes: Secondary | ICD-10-CM | POA: Diagnosis not present

## 2018-04-24 DIAGNOSIS — Z23 Encounter for immunization: Secondary | ICD-10-CM | POA: Diagnosis not present

## 2018-04-24 DIAGNOSIS — L03115 Cellulitis of right lower limb: Secondary | ICD-10-CM | POA: Diagnosis not present

## 2018-04-24 DIAGNOSIS — F339 Major depressive disorder, recurrent, unspecified: Secondary | ICD-10-CM | POA: Diagnosis not present

## 2018-05-11 DIAGNOSIS — T889XXS Complication of surgical and medical care, unspecified, sequela: Secondary | ICD-10-CM | POA: Diagnosis not present

## 2018-05-11 DIAGNOSIS — I509 Heart failure, unspecified: Secondary | ICD-10-CM | POA: Diagnosis not present

## 2018-05-11 DIAGNOSIS — G4733 Obstructive sleep apnea (adult) (pediatric): Secondary | ICD-10-CM | POA: Diagnosis not present

## 2018-05-11 DIAGNOSIS — I5033 Acute on chronic diastolic (congestive) heart failure: Secondary | ICD-10-CM | POA: Diagnosis not present

## 2018-05-20 ENCOUNTER — Telehealth: Payer: Self-pay | Admitting: *Deleted

## 2018-05-20 NOTE — Telephone Encounter (Signed)
Medical records faxed to [facility]; RID [phone]

## 2018-06-05 ENCOUNTER — Other Ambulatory Visit: Payer: Self-pay | Admitting: Internal Medicine

## 2018-06-11 DIAGNOSIS — I5033 Acute on chronic diastolic (congestive) heart failure: Secondary | ICD-10-CM | POA: Diagnosis not present

## 2018-06-11 DIAGNOSIS — I509 Heart failure, unspecified: Secondary | ICD-10-CM | POA: Diagnosis not present

## 2018-06-11 DIAGNOSIS — T889XXS Complication of surgical and medical care, unspecified, sequela: Secondary | ICD-10-CM | POA: Diagnosis not present

## 2018-06-11 DIAGNOSIS — G4733 Obstructive sleep apnea (adult) (pediatric): Secondary | ICD-10-CM | POA: Diagnosis not present

## 2018-06-18 DIAGNOSIS — E240 Pituitary-dependent Cushing's disease: Secondary | ICD-10-CM | POA: Diagnosis not present

## 2018-06-18 DIAGNOSIS — G4733 Obstructive sleep apnea (adult) (pediatric): Secondary | ICD-10-CM | POA: Diagnosis not present

## 2018-06-18 DIAGNOSIS — M545 Low back pain: Secondary | ICD-10-CM | POA: Diagnosis not present

## 2018-06-18 DIAGNOSIS — G4759 Other parasomnia: Secondary | ICD-10-CM | POA: Diagnosis not present

## 2018-06-19 DIAGNOSIS — L03115 Cellulitis of right lower limb: Secondary | ICD-10-CM | POA: Diagnosis not present

## 2018-06-24 DIAGNOSIS — L02415 Cutaneous abscess of right lower limb: Secondary | ICD-10-CM | POA: Diagnosis not present

## 2018-07-01 ENCOUNTER — Encounter (INDEPENDENT_AMBULATORY_CARE_PROVIDER_SITE_OTHER): Payer: Self-pay | Admitting: Orthopedic Surgery

## 2018-07-01 ENCOUNTER — Ambulatory Visit (INDEPENDENT_AMBULATORY_CARE_PROVIDER_SITE_OTHER): Payer: Medicare HMO | Admitting: Orthopedic Surgery

## 2018-07-01 VITALS — Ht 73.0 in | Wt >= 6400 oz

## 2018-07-01 DIAGNOSIS — L02419 Cutaneous abscess of limb, unspecified: Secondary | ICD-10-CM | POA: Diagnosis not present

## 2018-07-01 DIAGNOSIS — Z6841 Body Mass Index (BMI) 40.0 and over, adult: Secondary | ICD-10-CM | POA: Diagnosis not present

## 2018-07-01 DIAGNOSIS — I87331 Chronic venous hypertension (idiopathic) with ulcer and inflammation of right lower extremity: Secondary | ICD-10-CM | POA: Diagnosis not present

## 2018-07-01 DIAGNOSIS — Z79899 Other long term (current) drug therapy: Secondary | ICD-10-CM | POA: Diagnosis not present

## 2018-07-01 DIAGNOSIS — L97919 Non-pressure chronic ulcer of unspecified part of right lower leg with unspecified severity: Secondary | ICD-10-CM

## 2018-07-01 DIAGNOSIS — I10 Essential (primary) hypertension: Secondary | ICD-10-CM | POA: Diagnosis not present

## 2018-07-01 MED ORDER — SULFAMETHOXAZOLE-TRIMETHOPRIM 800-160 MG PO TABS: 1.0000 | ORAL_TABLET | Freq: Two times a day (BID) | ORAL | 0 refills | Status: DC

## 2018-07-01 NOTE — Progress Notes (Signed)
Office Visit Note   Patient: [name] [name]           Date of Birth: 05/06/1966           MRN: [id number] Visit Date: 07/01/2018              Requested by: [name], MD [address] [place], [place] [address] PCP: [name], MD  Chief Complaint  Patient presents with  . Right Leg - Open Wound      HPI: Patient is a 53 year old gentleman who is seen for initial evaluation for 2-week history of cellulitis of the right leg with a venous ulcer.  Patient states the abscess ruptured about a week ago he was initially started on doxycycline changed to clindamycin he states he is just finished his antibiotics.  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  Patient states he has an allergy to penicillin.  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  Assessment & Plan: Visit Diagnoses: No diagnosis found.  Plan: Discussed with the patient surgical versus nonsurgical intervention.  Patient states he would like to try nonsurgical intervention first.  We will start him on Bactrim DS and start him with the 2 layer medical compression stocking he will wear 2 layers during the day 1 layer at night and wash his leg with soap and water daily.  Discussed that there is any increase in his symptoms or redness or drainage that we will follow-up immediately and consider surgical intervention.  Discussed the importance of nutrition and supplements for healing we will draw an albumin and pre-albumin today recommended whey protein supplements twice a day.  Follow-Up Instructions: No follow-ups on file.   Ortho Exam  Patient is alert, oriented, no adenopathy, well-dressed, normal affect, normal respiratory effort. Examination patient is ambulating in a motorized wheelchair he has cellulitis and area of approximately 10 cm in diameter there is an open  wound 2 cm in diameter.  There is no streaking no ascending cellulitis.  Patient's calf measures 58 cm in circumference.  Patient has a good dorsalis pedis and posterior tibial pulse.  Imaging: No results found.   Labs: Lab Results  Component Value Date   HGBA1C 5.8 (H) 12/15/2013   REPTSTATUS 12/15/2013 FINAL 12/09/2013   CULT  12/09/2013    NO GROWTH 5 DAYS Performed at [facility]     Lab Results  Component Value Date   ALBUMIN 3.9 12/06/2013    Body mass index is 54.09 kg/m.  Orders:  No orders of the defined types were placed in this encounter.  No orders of the defined types were placed in this encounter.    Procedures: No procedures performed  Clinical Data: No additional findings.  ROS:  All other systems negative, except as noted in the HPI. Review of Systems  Objective: Vital Signs: Ht 6\' 1"  (1.854 m)   Wt (!) 410 lb (186 kg)   BMI 54.09 kg/m   Specialty Comments:  No specialty comments available.  PMFS History: Patient Active Problem List   Diagnosis Date Noted  . Obesity hypoventilation syndrome ([place]) 02/17/2014  . Atelectasis 02/17/2014  . New onset of headaches 01/01/2014  . Wound, open, anterior abdominal wall 01/01/2014  . Physical deconditioning 12/20/2013  . 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 Gout 12/15/2013  . Clinical depression 12/15/2013  . Chronic respiratory failure with  hypercapnia ([place]) 12/07/2013  . Morbid obesity ([place]) 12/07/2013  . 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[place]) 12/06/2013  . Pituitary adenoma ([place]) 12/06/2013  . Intrinsic asthma 10/06/2013  . OSA (obstructive sleep apnea) 10/06/2013  . Injury of kidney 08/07/2013  . Acute on chronic diastolic heart failure ([place]) 08/07/2013  . Disorder of peripheral nervous system 08/07/2013  . 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 Extreme obesity 08/02/2013  . Chronic diastolic heart failure ([place]) 08/02/2013  .  Infected wound 07/28/2013  . Elevated blood uric acid level 04/23/2013  . Abdominal wall abscess 12/27/2012  . Calculus of kidney 11/22/2012  . Kidney cysts 11/22/2012  . BP (high blood pressure) 05/21/2012  . H/O infectious disease 12/04/2011   Past Medical History:  Diagnosis Date  . Allergic rhinitis   . Anxiety   . Arthritis   . Depression   . Diastolic CHF ([place])   . Gout   . H/O acute respiratory failure   . Hypertension   . Morbid obesity ([place])   . Nephrolithiasis   . Peripheral neuropathy   . Pituitary tumor   . PONV (postoperative nausea and vomiting)   . Sleep apnea    uses bi-pap    Family History  Problem Relation Age of Onset  . Hypertension Mother   . Hypertension Father   . Gout Father   . Hypothyroidism Father   . Gout Brother   . Cancer Maternal Aunt        colon  . Cancer Maternal Grandmother        breast    Past Surgical History:  Procedure Laterality Date  . ABDOMINAL SURGERY  06/2013   for cyst with MRSA  . ACHILLES TENDON REPAIR Bilateral   . APPLICATION OF A-CELL OF CHEST/ABDOMEN N/A 01/26/2014   Procedure: PLACEMENT OF A CELL AND VAC ;  Surgeon: [name], DO;  Location: [facility];  Service: Plastics;  Laterality: N/A;  . APPLICATION OF A-CELL OF CHEST/ABDOMEN N/A 02/02/2014   Procedure: WITH PLACEMENT OF A CELL AND VAC ;  Surgeon: [name], DO;  Location: [facility];  Service: Plastics;  Laterality: N/A;  . BRAIN SURGERY    . COLON RESECTION    . CRANIOTOMY N/A 12/10/2013   Procedure: Transpheniodal resection of Pituitary tumor with Dr. Radene Journey for approach;  Surgeon: Floyce Stakes, MD;  Location: [facility] NEURO ORS;  Service: Neurosurgery;  Laterality: N/A;  Transpheniodal resection of Pituitary tumor with Dr. Radene Journey for approach  . HERNIA REPAIR    . INCISION AND DRAINAGE OF WOUND N/A 01/26/2014   Procedure: IRRIGATION AND DEBRIDEMENT ABDOMINAL WOUND WITH ;  Surgeon: [name], DO;  Location: [facility];  Service: Plastics;  Laterality:  N/A;  . INCISION AND DRAINAGE OF WOUND N/A 02/02/2014   Procedure: IRRIGATION AND DEBRIDEMENT ABDOMINAL WOUND;  Surgeon: [name], DO;  Location: [facility];  Service: Plastics;  Laterality: N/A;  . INCISION AND DRAINAGE OF WOUND N/A 02/12/2014   Procedure: IRRIGATION AND DEBRIDEMENT OF ABDOMINAL WOUND/PLACEMENT OF A-CELL AND VAC;  Surgeon: [name], DO;  Location: [facility];  Service: Plastics;  Laterality: N/A;  . INCISION AND DRAINAGE OF WOUND N/A 04/01/2014   Procedure: IRRIGATION AND DEBRIDEMENT ABDOMINAL WOUND WITH PLACEMENT OF ACELL/VAC;  Surgeon: [name], DO;  Location: [facility];  Service: Plastics;  Laterality: N/A;  . Sigmoid Colonoscopy    . TRANSNASAL APPROACH N/A 12/10/2013   Procedure: TRANSNASAL APPROACH;  Surgeon: Harrell Gave  Lincoln Maxin, MD;  Location: [facility] NEURO [facility]S;  Service: ENT;  Laterality: N/A;   Social History   Occupational History  . Not on file  Tobacco Use  . Smoking status: Never Smoker  . Smokeless tobacco: Never Used  Substance and Sexual Activity  . Alcohol use: No  . Drug use: No  . Sexual activity: Never

## 2018-07-02 ENCOUNTER — Ambulatory Visit (INDEPENDENT_AMBULATORY_CARE_PROVIDER_SITE_OTHER): Payer: Self-pay | Admitting: Orthopedic Surgery

## 2018-07-02 LAB — PREALBUMIN: Prealbumin: 27 mg/dL (ref 21–43)

## 2018-07-02 LAB — ALBUMIN: Albumin: 4.3 g/dL (ref 3.6–5.1)

## 2018-07-12 DIAGNOSIS — T889XXS Complication of surgical and medical care, unspecified, sequela: Secondary | ICD-10-CM | POA: Diagnosis not present

## 2018-07-12 DIAGNOSIS — I509 Heart failure, unspecified: Secondary | ICD-10-CM | POA: Diagnosis not present

## 2018-07-12 DIAGNOSIS — I5033 Acute on chronic diastolic (congestive) heart failure: Secondary | ICD-10-CM | POA: Diagnosis not present

## 2018-07-12 DIAGNOSIS — G4733 Obstructive sleep apnea (adult) (pediatric): Secondary | ICD-10-CM | POA: Diagnosis not present

## 2018-07-16 ENCOUNTER — Encounter (INDEPENDENT_AMBULATORY_CARE_PROVIDER_SITE_OTHER): Payer: Self-pay | Admitting: Orthopedic Surgery

## 2018-07-16 ENCOUNTER — Ambulatory Visit (INDEPENDENT_AMBULATORY_CARE_PROVIDER_SITE_OTHER): Payer: Medicare HMO | Admitting: Physician Assistant

## 2018-07-16 VITALS — Ht 73.0 in | Wt >= 6400 oz

## 2018-07-16 DIAGNOSIS — I87331 Chronic venous hypertension (idiopathic) with ulcer and inflammation of right lower extremity: Secondary | ICD-10-CM

## 2018-07-16 DIAGNOSIS — Z6841 Body Mass Index (BMI) 40.0 and over, adult: Secondary | ICD-10-CM | POA: Diagnosis not present

## 2018-07-16 DIAGNOSIS — L97919 Non-pressure chronic ulcer of unspecified part of right lower leg with unspecified severity: Secondary | ICD-10-CM | POA: Diagnosis not present

## 2018-07-16 NOTE — Progress Notes (Signed)
Office Visit Note   Patient: [name] [name]           Date of Birth: [birth year]           MRN: [id number] Visit Date: 07/16/2018              Requested by: [name], MD [address] [place], [place] [address] PCP: [name], MD  Chief Complaint  Patient presents with  . Right Leg - Follow-up    Right leg ulcer       HPI: The patient is a 53 year old gentleman who is seen for follow-up of his right lower extremity ulcer.  He has chronic venous hypertension with bilateral lower extremity edema and was started on a Vive silver compression sock with a sleeve and reports he has been doing very well with this.  He was also started on Bactrim DS 1 p.o. twice daily for the next several weeks.  He is very pleased with his progress over the past week.  Assessment & Plan: Visit Diagnoses:  1. 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 BMI 50.0-59.9, adult (HCC)     Plan: Continue Septra DS 1 p.o. twice daily.  Continue Vive the silver compression sock and sleeve during the daytime.  Reinforced wearing the sock portion 24 hours a day except for hygiene.  He will follow-up in 2 weeks or sooner should he have difficulties in the interim.  Follow-Up Instructions: Return in about 2 weeks (around 07/30/2018).   Ortho Exam  Patient is alert, oriented, no adenopathy, well-dressed, normal affect, normal respiratory effort. The right lower leg ulcer is to the fat layer only.  There is no visible tendon or bone.  There is minimal peri-wound erythema and decreased edema.  The wound is approximately 1 cm in diameter.  50% pink beefy granulation tissue, otherwise yellow slough which was gently debrided with gauze.  Has palpable pedal pulses.  Imaging: No results found.   Labs: Lab Results  Component Value Date   HGBA1C 5.8 (H) 12/15/2013   REPTSTATUS 12/15/2013 FINAL 12/09/2013   CULT  12/09/2013    NO GROWTH  5 DAYS Performed at [facility]     Lab Results  Component Value Date   ALBUMIN 3.9 12/06/2013   PREALBUMIN 27 07/01/2018    Body mass index is 54.09 kg/m.  Orders:  No orders of the defined types were placed in this encounter.  No orders of the defined types were placed in this encounter.    Procedures: No procedures performed  Clinical Data: No additional findings.  ROS:  All other systems negative, except as noted in the HPI. Review of Systems  Objective: Vital Signs: Ht 6\' 1"  (1.854 m)   Wt (!) 410 lb (186 kg)   BMI 54.09 kg/m   Specialty Comments:  No specialty comments available.  PMFS History: Patient Active Problem List   Diagnosis Date Noted  . Idiopathic chronic venous hypertension of right lower extremity with ulcer and inflammation ([place]) 07/01/2018  . Body mass index 50.0-59.9, adult ([place]) 07/01/2018  . Obesity hypoventilation syndrome ([place]) 02/17/2014  . Atelectasis 02/17/2014  . New onset of headaches 01/01/2014  . Wound, open, anterior abdominal wall 01/01/2014  . Physical deconditioning 12/20/2013  . 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 Gout 12/15/2013  . Clinical depression 12/15/2013  . Chronic respiratory failure with hypercapnia ([place]) 12/07/2013  . Morbid obesity ([place]) 12/07/2013  . Neoplasm of brain causing  mass effect on adjacent structures ([place]) 12/06/2013  . Pituitary adenoma ([place]) 12/06/2013  . Intrinsic asthma 10/06/2013  . OSA (obstructive sleep apnea) 10/06/2013  . Injury of kidney 08/07/2013  . Acute on chronic diastolic heart failure ([place]) 08/07/2013  . Disorder of peripheral nervous system 08/07/2013  . 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 Extreme obesity 08/02/2013  . Chronic diastolic heart failure ([place]) 08/02/2013  . Infected wound 07/28/2013  . Elevated blood uric acid level 04/23/2013  . Abdominal wall abscess 12/27/2012  . Calculus of kidney 11/22/2012  . Kidney cysts 11/22/2012   . BP (high blood pressure) 05/21/2012  . H/O infectious disease 12/04/2011   Past Medical History:  Diagnosis Date  . Allergic rhinitis   . Anxiety   . Arthritis   . Depression   . Diastolic CHF ([place])   . Gout   . H/O acute respiratory failure   . Hypertension   . Morbid obesity ([place])   . Nephrolithiasis   . Peripheral neuropathy   . Pituitary tumor   . PONV (postoperative nausea and vomiting)   . Sleep apnea    uses bi-pap    Family History  Problem Relation Age of Onset  . Hypertension Mother   . Hypertension Father   . Gout Father   . Hypothyroidism Father   . Gout Brother   . Cancer Maternal Aunt        colon  . Cancer Maternal Grandmother        breast    Past Surgical History:  Procedure Laterality Date  . ABDOMINAL SURGERY  06/2013   for cyst with MRSA  . ACHILLES TENDON REPAIR Bilateral   . APPLICATION OF A-CELL OF CHEST/ABDOMEN N/A 01/26/2014   Procedure: PLACEMENT OF A CELL AND VAC ;  Surgeon: [name], DO;  Location: [facility];  Service: Plastics;  Laterality: N/A;  . APPLICATION OF A-CELL OF CHEST/ABDOMEN N/A 02/02/2014   Procedure: WITH PLACEMENT OF A CELL AND VAC ;  Surgeon: [name], DO;  Location: [facility];  Service: Plastics;  Laterality: N/A;  . BRAIN SURGERY    . COLON RESECTION    . CRANIOTOMY N/A 12/10/2013   Procedure: Transpheniodal resection of Pituitary tumor with Dr. Radene Journey for approach;  Surgeon: Floyce Stakes, MD;  Location: [facility] NEURO ORS;  Service: Neurosurgery;  Laterality: N/A;  Transpheniodal resection of Pituitary tumor with Dr. Radene Journey for approach  . HERNIA REPAIR    . INCISION AND DRAINAGE OF WOUND N/A 01/26/2014   Procedure: IRRIGATION AND DEBRIDEMENT ABDOMINAL WOUND WITH ;  Surgeon: [name], DO;  Location: [facility];  Service: Plastics;  Laterality: N/A;  . INCISION AND DRAINAGE OF WOUND N/A 02/02/2014   Procedure: IRRIGATION AND DEBRIDEMENT ABDOMINAL WOUND;  Surgeon: [name], DO;  Location: [facility];  Service:  Plastics;  Laterality: N/A;  . INCISION AND DRAINAGE OF WOUND N/A 02/12/2014   Procedure: IRRIGATION AND DEBRIDEMENT OF ABDOMINAL WOUND/PLACEMENT OF A-CELL AND VAC;  Surgeon: [name], DO;  Location: [facility];  Service: Plastics;  Laterality: N/A;  . INCISION AND DRAINAGE OF WOUND N/A 04/01/2014   Procedure: IRRIGATION AND DEBRIDEMENT ABDOMINAL WOUND WITH PLACEMENT OF ACELL/VAC;  Surgeon: [name], DO;  Location: [facility];  Service: Plastics;  Laterality: N/A;  . Sigmoid Colonoscopy    . TRANSNASAL APPROACH N/A 12/10/2013   Procedure: TRANSNASAL APPROACH;  Surgeon: Rozetta Nunnery, MD;  Location: [facility] NEURO ORS;  Service: ENT;  Laterality: N/A;  Social History   Occupational History  . Not on file  Tobacco Use  . Smoking status: Never Smoker  . Smokeless tobacco: Never Used  Substance and Sexual Activity  . Alcohol use: No  . Drug use: No  . Sexual activity: Never

## 2018-07-19 ENCOUNTER — Encounter (INDEPENDENT_AMBULATORY_CARE_PROVIDER_SITE_OTHER): Payer: Self-pay | Admitting: Physician Assistant

## 2018-07-30 ENCOUNTER — Ambulatory Visit (INDEPENDENT_AMBULATORY_CARE_PROVIDER_SITE_OTHER): Payer: Medicare HMO | Admitting: Physician Assistant

## 2018-07-30 ENCOUNTER — Encounter (INDEPENDENT_AMBULATORY_CARE_PROVIDER_SITE_OTHER): Payer: Self-pay | Admitting: Physician Assistant

## 2018-07-30 VITALS — Ht 73.0 in | Wt >= 6400 oz

## 2018-07-30 DIAGNOSIS — Z6841 Body Mass Index (BMI) 40.0 and over, adult: Secondary | ICD-10-CM | POA: Diagnosis not present

## 2018-07-30 DIAGNOSIS — I87331 Chronic venous hypertension (idiopathic) with ulcer and inflammation of right lower extremity: Secondary | ICD-10-CM | POA: Diagnosis not present

## 2018-07-30 DIAGNOSIS — L97919 Non-pressure chronic ulcer of unspecified part of right lower leg with unspecified severity: Secondary | ICD-10-CM

## 2018-07-30 NOTE — Progress Notes (Signed)
Office Visit Note   Patient: [name] [name]           Date of Birth: 08-Oct-1965           MRN: [id number] Visit Date: 07/30/2018              Requested by: [name], MD [address] [place], [place] [address] PCP: [name], MD  Chief Complaint  Patient presents with  . Right Leg - Wound Check      HPI: The patient is a 53 year old gentleman who is seen for follow-up of his right lower extremity ulcer.  He has chronic venous hypertension of both lower extremities with edema.  He is wearing a Vive silver compression sock and a sleeve during the day and reports he has been doing quite well with this.  He completed a course of Bactrim DS.  Assessment & Plan: Visit Diagnoses:  1. 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 BMI 50.0-59.9, adult [facility])     Plan: Instructed patient to continue wearing his Vive compression sock around-the-clock and wear the compression sleeve during the daytime.  He can continue to wash the area with Dial soap and water.  He can moisturize around the open area with Shea butter.  He will follow-up in 3 weeks.  Follow-Up Instructions: Return in about 3 weeks (around 08/20/2018).   Ortho Exam  Patient is alert, oriented, no adenopathy, well-dressed, normal affect, normal respiratory effort. Patient utilizes a motorized chair for mobility.  The right lower leg ulcer is to the fat layer only but appears less depth today.  There is no visible bone or tendon.  The periwound erythema is improving and there is decreased edema.  The wound is approximately 8 mm in diameter with mostly good granulation and decreased overall size.  He does appear to be granulating in from the margins.    Imaging: No results found. No images are attached to the encounter.  Labs: Lab Results  Component Value Date   HGBA1C 5.8 (H) 12/15/2013   REPTSTATUS 12/15/2013 FINAL 12/09/2013   CULT   12/09/2013    NO GROWTH 5 DAYS Performed at [facility]     Lab Results  Component Value Date   ALBUMIN 3.9 12/06/2013   PREALBUMIN 27 07/01/2018    Body mass index is 54.09 kg/m.  Orders:  No orders of the defined types were placed in this encounter.  No orders of the defined types were placed in this encounter.    Procedures: No procedures performed  Clinical Data: No additional findings.  ROS:  All other systems negative, except as noted in the HPI. Review of Systems  Objective: Vital Signs: Ht 6\' 1"  (1.854 m)   Wt (!) 410 lb (186 kg)   BMI 54.09 kg/m   Specialty Comments:  No specialty comments available.  PMFS History: Patient Active Problem List   Diagnosis Date Noted  . Idiopathic chronic venous hypertension of right lower extremity with ulcer and inflammation ([place]) 07/01/2018  . Body mass index 50.0-59.9, adult ([place]) 07/01/2018  . Obesity hypoventilation syndrome ([place]) 02/17/2014  . Atelectasis 02/17/2014  . New onset of headaches 01/01/2014  . Wound, open, anterior abdominal wall 01/01/2014  . Physical deconditioning 12/20/2013  . 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 Gout 12/15/2013  . Clinical depression 12/15/2013  . Chronic respiratory failure with hypercapnia ([place]) 12/07/2013  . Morbid obesity ([place]) 12/07/2013  . Neoplasm of brain causing  mass effect on adjacent structures ([place]) 12/06/2013  . Pituitary adenoma ([place]) 12/06/2013  . Intrinsic asthma 10/06/2013  . OSA (obstructive sleep apnea) 10/06/2013  . Injury of kidney 08/07/2013  . Acute on chronic diastolic heart failure ([place]) 08/07/2013  . Disorder of peripheral nervous system 08/07/2013  . 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 Extreme obesity 08/02/2013  . Chronic diastolic heart failure ([place]) 08/02/2013  . Infected wound 07/28/2013  . Elevated blood uric acid level 04/23/2013  . Abdominal wall abscess 12/27/2012  . Calculus of kidney 11/22/2012    . Kidney cysts 11/22/2012  . BP (high blood pressure) 05/21/2012  . H/O infectious disease 12/04/2011   Past Medical History:  Diagnosis Date  . Allergic rhinitis   . Anxiety   . Arthritis   . Depression   . Diastolic CHF ([place])   . Gout   . H/O acute respiratory failure   . Hypertension   . Morbid obesity ([place])   . Nephrolithiasis   . Peripheral neuropathy   . Pituitary tumor   . PONV (postoperative nausea and vomiting)   . Sleep apnea    uses bi-pap    Family History  Problem Relation Age of Onset  . Hypertension Mother   . Hypertension Father   . Gout Father   . Hypothyroidism Father   . Gout Brother   . Cancer Maternal Aunt        colon  . Cancer Maternal Grandmother        breast    Past Surgical History:  Procedure Laterality Date  . ABDOMINAL SURGERY  06/2013   for cyst with MRSA  . ACHILLES TENDON REPAIR Bilateral   . APPLICATION OF A-CELL OF CHEST/ABDOMEN N/A 01/26/2014   Procedure: PLACEMENT OF A CELL AND VAC ;  Surgeon: [name], DO;  Location: [facility];  Service: Plastics;  Laterality: N/A;  . APPLICATION OF A-CELL OF CHEST/ABDOMEN N/A 02/02/2014   Procedure: WITH PLACEMENT OF A CELL AND VAC ;  Surgeon: [name], DO;  Location: [facility];  Service: Plastics;  Laterality: N/A;  . BRAIN SURGERY    . COLON RESECTION    . CRANIOTOMY N/A 12/10/2013   Procedure: Transpheniodal resection of Pituitary tumor with Dr. Radene Journey for approach;  Surgeon: Floyce Stakes, MD;  Location: [facility] NEURO ORS;  Service: Neurosurgery;  Laterality: N/A;  Transpheniodal resection of Pituitary tumor with Dr. Radene Journey for approach  . HERNIA REPAIR    . INCISION AND DRAINAGE OF WOUND N/A 01/26/2014   Procedure: IRRIGATION AND DEBRIDEMENT ABDOMINAL WOUND WITH ;  Surgeon: [name], DO;  Location: [facility];  Service: Plastics;  Laterality: N/A;  . INCISION AND DRAINAGE OF WOUND N/A 02/02/2014   Procedure: IRRIGATION AND DEBRIDEMENT ABDOMINAL WOUND;  Surgeon: [name], DO;   Location: [facility];  Service: Plastics;  Laterality: N/A;  . INCISION AND DRAINAGE OF WOUND N/A 02/12/2014   Procedure: IRRIGATION AND DEBRIDEMENT OF ABDOMINAL WOUND/PLACEMENT OF A-CELL AND VAC;  Surgeon: [name], DO;  Location: [facility];  Service: Plastics;  Laterality: N/A;  . INCISION AND DRAINAGE OF WOUND N/A 04/01/2014   Procedure: IRRIGATION AND DEBRIDEMENT ABDOMINAL WOUND WITH PLACEMENT OF ACELL/VAC;  Surgeon: [name], DO;  Location: [facility];  Service: Plastics;  Laterality: N/A;  . Sigmoid Colonoscopy    . TRANSNASAL APPROACH N/A 12/10/2013   Procedure: TRANSNASAL APPROACH;  Surgeon: Rozetta Nunnery, MD;  Location: [facility] NEURO ORS;  Service: ENT;  Laterality: N/A;  Social History   Occupational History  . Not on file  Tobacco Use  . Smoking status: Never Smoker  . Smokeless tobacco: Never Used  Substance and Sexual Activity  . Alcohol use: No  . Drug use: No  . Sexual activity: Never

## 2018-08-06 DIAGNOSIS — D497 Neoplasm of unspecified behavior of endocrine glands and other parts of nervous system: Secondary | ICD-10-CM | POA: Diagnosis not present

## 2018-08-06 DIAGNOSIS — E249 Cushing's syndrome, unspecified: Secondary | ICD-10-CM | POA: Diagnosis not present

## 2018-08-09 DIAGNOSIS — G4733 Obstructive sleep apnea (adult) (pediatric): Secondary | ICD-10-CM | POA: Diagnosis not present

## 2018-08-10 DIAGNOSIS — G4733 Obstructive sleep apnea (adult) (pediatric): Secondary | ICD-10-CM | POA: Diagnosis not present

## 2018-08-10 DIAGNOSIS — I509 Heart failure, unspecified: Secondary | ICD-10-CM | POA: Diagnosis not present

## 2018-08-10 DIAGNOSIS — T889XXS Complication of surgical and medical care, unspecified, sequela: Secondary | ICD-10-CM | POA: Diagnosis not present

## 2018-08-10 DIAGNOSIS — I5033 Acute on chronic diastolic (congestive) heart failure: Secondary | ICD-10-CM | POA: Diagnosis not present

## 2018-08-19 ENCOUNTER — Telehealth (INDEPENDENT_AMBULATORY_CARE_PROVIDER_SITE_OTHER): Payer: Self-pay | Admitting: Physician Assistant

## 2018-08-19 NOTE — Telephone Encounter (Signed)
Called pt and he states that he is concerned about coming out of the house he has a complicated medical history and does not want to risk exposure. Cancelled the appt for tomorrow and r/s a few weeks out mod April. Pt will call with questions and utilize mychart to upload any photos of his leg he would like to share or has any questions about.

## 2018-08-19 NOTE — Telephone Encounter (Signed)
Patient called asked if Raquel Sarna would call him back. Patient said he has a compromised immune system and don't know if he should come out. Patient said he is also concerned about the wound on his right leg. Patient has an appointment tomorrow. The number to contact patient is [phone]

## 2018-08-20 ENCOUNTER — Ambulatory Visit (INDEPENDENT_AMBULATORY_CARE_PROVIDER_SITE_OTHER): Payer: Medicare HMO | Admitting: Orthopedic Surgery

## 2018-08-31 ENCOUNTER — Other Ambulatory Visit (INDEPENDENT_AMBULATORY_CARE_PROVIDER_SITE_OTHER): Payer: Self-pay | Admitting: Orthopedic Surgery

## 2018-09-01 NOTE — Telephone Encounter (Signed)
Rx request 

## 2018-09-10 DIAGNOSIS — I5033 Acute on chronic diastolic (congestive) heart failure: Secondary | ICD-10-CM | POA: Diagnosis not present

## 2018-09-10 DIAGNOSIS — I509 Heart failure, unspecified: Secondary | ICD-10-CM | POA: Diagnosis not present

## 2018-09-10 DIAGNOSIS — G4733 Obstructive sleep apnea (adult) (pediatric): Secondary | ICD-10-CM | POA: Diagnosis not present

## 2018-09-10 DIAGNOSIS — T889XXS Complication of surgical and medical care, unspecified, sequela: Secondary | ICD-10-CM | POA: Diagnosis not present

## 2018-09-12 DIAGNOSIS — G4733 Obstructive sleep apnea (adult) (pediatric): Secondary | ICD-10-CM | POA: Diagnosis not present

## 2018-09-16 ENCOUNTER — Telehealth (INDEPENDENT_AMBULATORY_CARE_PROVIDER_SITE_OTHER): Payer: Self-pay | Admitting: Radiology

## 2018-09-16 ENCOUNTER — Telehealth (INDEPENDENT_AMBULATORY_CARE_PROVIDER_SITE_OTHER): Payer: Self-pay

## 2018-09-16 NOTE — Telephone Encounter (Signed)
LEFT VM FOR PATIENT TO CALL BACK AND R/S 4/14 APPT TO EARLIER IN MORNING. STAR, DR. Jess Barters ASSISTANT HAS LEFT 1 MESSAGE ALREADY AS WELL.

## 2018-09-16 NOTE — Telephone Encounter (Signed)
Patient was called and LVM for him to return our call to be pre-screened for COVID-19 and to get rescheduled to earlier appt time for 09/17/2018.

## 2018-09-17 ENCOUNTER — Ambulatory Visit (INDEPENDENT_AMBULATORY_CARE_PROVIDER_SITE_OTHER): Payer: Medicare HMO | Admitting: Orthopedic Surgery

## 2018-09-24 ENCOUNTER — Encounter (INDEPENDENT_AMBULATORY_CARE_PROVIDER_SITE_OTHER): Payer: Self-pay | Admitting: Orthopedic Surgery

## 2018-09-26 ENCOUNTER — Ambulatory Visit (INDEPENDENT_AMBULATORY_CARE_PROVIDER_SITE_OTHER): Payer: Medicare HMO | Admitting: Orthopedic Surgery

## 2018-10-07 ENCOUNTER — Telehealth: Payer: Self-pay | Admitting: Internal Medicine

## 2018-10-07 NOTE — Telephone Encounter (Signed)
Template change - 2/77 f/u time changed from 12:40pm to 12:30pm.  Left message. Schedule mailed.

## 2018-10-10 DIAGNOSIS — I509 Heart failure, unspecified: Secondary | ICD-10-CM | POA: Diagnosis not present

## 2018-10-10 DIAGNOSIS — T889XXS Complication of surgical and medical care, unspecified, sequela: Secondary | ICD-10-CM | POA: Diagnosis not present

## 2018-10-10 DIAGNOSIS — G4733 Obstructive sleep apnea (adult) (pediatric): Secondary | ICD-10-CM | POA: Diagnosis not present

## 2018-10-10 DIAGNOSIS — I5033 Acute on chronic diastolic (congestive) heart failure: Secondary | ICD-10-CM | POA: Diagnosis not present

## 2018-10-25 DIAGNOSIS — G4733 Obstructive sleep apnea (adult) (pediatric): Secondary | ICD-10-CM | POA: Diagnosis not present

## 2018-11-10 DIAGNOSIS — T889XXS Complication of surgical and medical care, unspecified, sequela: Secondary | ICD-10-CM | POA: Diagnosis not present

## 2018-11-10 DIAGNOSIS — I509 Heart failure, unspecified: Secondary | ICD-10-CM | POA: Diagnosis not present

## 2018-11-10 DIAGNOSIS — I5033 Acute on chronic diastolic (congestive) heart failure: Secondary | ICD-10-CM | POA: Diagnosis not present

## 2018-11-10 DIAGNOSIS — G4733 Obstructive sleep apnea (adult) (pediatric): Secondary | ICD-10-CM | POA: Diagnosis not present

## 2018-11-25 ENCOUNTER — Other Ambulatory Visit: Payer: Medicare HMO

## 2018-11-28 ENCOUNTER — Ambulatory Visit: Payer: Medicare HMO | Admitting: Internal Medicine

## 2018-12-10 DIAGNOSIS — T889XXS Complication of surgical and medical care, unspecified, sequela: Secondary | ICD-10-CM | POA: Diagnosis not present

## 2018-12-10 DIAGNOSIS — I5033 Acute on chronic diastolic (congestive) heart failure: Secondary | ICD-10-CM | POA: Diagnosis not present

## 2018-12-10 DIAGNOSIS — I509 Heart failure, unspecified: Secondary | ICD-10-CM | POA: Diagnosis not present

## 2018-12-10 DIAGNOSIS — G4733 Obstructive sleep apnea (adult) (pediatric): Secondary | ICD-10-CM | POA: Diagnosis not present

## 2018-12-20 DIAGNOSIS — Z0000 Encounter for general adult medical examination without abnormal findings: Secondary | ICD-10-CM | POA: Diagnosis not present

## 2018-12-20 DIAGNOSIS — R7303 Prediabetes: Secondary | ICD-10-CM | POA: Diagnosis not present

## 2018-12-20 DIAGNOSIS — Z125 Encounter for screening for malignant neoplasm of prostate: Secondary | ICD-10-CM | POA: Diagnosis not present

## 2018-12-20 DIAGNOSIS — E2740 Unspecified adrenocortical insufficiency: Secondary | ICD-10-CM | POA: Diagnosis not present

## 2018-12-20 DIAGNOSIS — Z1159 Encounter for screening for other viral diseases: Secondary | ICD-10-CM | POA: Diagnosis not present

## 2018-12-20 DIAGNOSIS — E230 Hypopituitarism: Secondary | ICD-10-CM | POA: Diagnosis not present

## 2018-12-20 DIAGNOSIS — E038 Other specified hypothyroidism: Secondary | ICD-10-CM | POA: Diagnosis not present

## 2018-12-23 DIAGNOSIS — E2740 Unspecified adrenocortical insufficiency: Secondary | ICD-10-CM | POA: Diagnosis not present

## 2019-01-03 DIAGNOSIS — D696 Thrombocytopenia, unspecified: Secondary | ICD-10-CM | POA: Diagnosis not present

## 2019-01-03 DIAGNOSIS — I5030 Unspecified diastolic (congestive) heart failure: Secondary | ICD-10-CM | POA: Diagnosis not present

## 2019-01-03 DIAGNOSIS — F339 Major depressive disorder, recurrent, unspecified: Secondary | ICD-10-CM | POA: Diagnosis not present

## 2019-01-03 DIAGNOSIS — I10 Essential (primary) hypertension: Secondary | ICD-10-CM | POA: Diagnosis not present

## 2019-01-03 DIAGNOSIS — G4733 Obstructive sleep apnea (adult) (pediatric): Secondary | ICD-10-CM | POA: Diagnosis not present

## 2019-01-03 DIAGNOSIS — G629 Polyneuropathy, unspecified: Secondary | ICD-10-CM | POA: Diagnosis not present

## 2019-01-03 DIAGNOSIS — Z0000 Encounter for general adult medical examination without abnormal findings: Secondary | ICD-10-CM | POA: Diagnosis not present

## 2019-01-03 DIAGNOSIS — E230 Hypopituitarism: Secondary | ICD-10-CM | POA: Diagnosis not present

## 2019-01-15 DIAGNOSIS — Z1211 Encounter for screening for malignant neoplasm of colon: Secondary | ICD-10-CM | POA: Diagnosis not present

## 2019-01-15 DIAGNOSIS — Z1212 Encounter for screening for malignant neoplasm of rectum: Secondary | ICD-10-CM | POA: Diagnosis not present

## 2019-02-27 ENCOUNTER — Telehealth: Payer: Self-pay | Admitting: *Deleted

## 2019-02-27 NOTE — Telephone Encounter (Signed)
Patient is past due for MRI of Brain that was originally pushed out due to [place] and patient lives with elderly parents.  Letter was mailed to attempt to schedule.  LM today to discuss getting patient scheduled.  Pending call back.

## 2019-02-28 ENCOUNTER — Other Ambulatory Visit: Payer: Self-pay | Admitting: *Deleted

## 2019-02-28 DIAGNOSIS — D352 Benign neoplasm of pituitary gland: Secondary | ICD-10-CM

## 2019-03-13 ENCOUNTER — Other Ambulatory Visit: Payer: Self-pay | Admitting: Radiation Therapy

## 2019-03-28 ENCOUNTER — Other Ambulatory Visit: Payer: Self-pay

## 2019-03-28 ENCOUNTER — Ambulatory Visit
Admission: RE | Admit: 2019-03-28 | Discharge: 2019-03-28 | Disposition: A | Discharge status: Home / Self Care | Payer: Medicare HMO | Source: Ambulatory Visit | Attending: Internal Medicine | Admitting: Internal Medicine

## 2019-03-28 DIAGNOSIS — D352 Benign neoplasm of pituitary gland: Secondary | ICD-10-CM

## 2019-03-28 MED ORDER — GADOBENATE DIMEGLUMINE 529 MG/ML IV SOLN
15.0000 mL | Freq: Once | INTRAVENOUS | Status: AC | PRN
  Administered 2019-03-28: 15 mL via INTRAVENOUS

## 2019-04-02 ENCOUNTER — Telehealth: Payer: Self-pay | Admitting: Internal Medicine

## 2019-04-02 NOTE — Telephone Encounter (Signed)
Called pt per 10/27 sch message - unable to reach pt . Left message with appt date and time

## 2019-04-07 ENCOUNTER — Encounter: Payer: Self-pay | Admitting: Orthopedic Surgery

## 2019-04-07 ENCOUNTER — Encounter: Payer: Self-pay | Admitting: Internal Medicine

## 2019-04-07 ENCOUNTER — Inpatient Hospital Stay: Payer: Medicare HMO | Attending: Internal Medicine | Admitting: Internal Medicine

## 2019-04-07 DIAGNOSIS — D352 Benign neoplasm of pituitary gland: Secondary | ICD-10-CM

## 2019-04-07 NOTE — Progress Notes (Signed)
I connected with [name] [name] on 04/07/19 at 12:30 PM EST by telephone visit and verified that I am speaking with the correct person using two identifiers.  I discussed the limitations, risks, security and privacy concerns of performing an evaluation and management service by telemedicine and the availability of in-person appointments. I also discussed with the patient that there may be a patient responsible charge related to this service. The patient expressed understanding and agreed to proceed.  Other persons participating in the visit and their role in the encounter:  n/a  Patient's location:  Home  Provider's location:  Office  Chief Complaint:  Pituitary Adenoma  History of Present Ilness: No new or progressive neurologic deficits.  Denies seizures, headaches.  No recent changes to endrocrine regimen. Observations: Language fluent, cognition at baseline  Imaging:  [place] Clinician Interpretation: I have personally reviewed the CNS images as listed.  My interpretation, in the context of the patient's clinical presentation, is stable disease  Mr [name] [name] Wo Contrast  Result Date: 03/30/2019 CLINICAL DATA:  Benign neoplasm pituitary gland. History of pituitary surgery Creatinine was obtained on site at [facility] at [address] Results: Creatinine 1.0  mg/dL. EXAM: MRI HEAD WITHOUT AND WITH CONTRAST TECHNIQUE: Multiplanar, multiecho pulse sequences of the brain and surrounding structures were obtained without and with intravenous contrast. CONTRAST:  40mL MULTIHANCE GADOBENATE DIMEGLUMINE 529 MG/ML IV SOLN COMPARISON:  MRI head 02/25/2018 FINDINGS: Brain: Pituitary protocol. The sella is mildly enlarged. Prior pituitary resection. The sella is predominately filled with CSF. Small residual pituitary in the left sella with deviation of the infundibulum to the left, unchanged. No recurrent tumor in the sella. Optic chiasm normal. Hypoenhancing lesion in the right cavernous sinus  measures 3 x 5 mm on coronal images and appears slightly smaller compared to the prior study. Ventricle size normal. Negative for acute infarct. Scattered small white matter hyperintensities bilaterally unchanged. Mild hyperintensity right pons unchanged. Vascular: Normal arterial flow voids Skull and upper cervical spine: Negative Sinuses/Orbits: Negative Other: None IMPRESSION: 1. No evidence of recurrent tumor. Postop trans-sphenoidal resection of pituitary tumor. 2. 3 x 5 mm hypoenhancing nodule right cavernous sinus appears improved from the prior study. Electronically Signed   By: Franchot Gallo M.D.   On: 03/30/2019 09:32    Assessment and Plan: Clinically and radiographically stable  Follow Up Instructions: Follow up in 1 year with MRI brain for evaluation  I discussed the assessment and treatment plan with the patient.  The patient was provided an opportunity to ask questions and all were answered.  The patient agreed with the plan and demonstrated understanding of the instructions.    The patient was advised to call back or seek an in-person evaluation if the symptoms worsen or if the condition fails to improve as anticipated.  I provided 5-10 minutes of non-face-to-face time during this enocunter.  Ventura Sellers, MD   I provided 15 minutes of non face-to-face telephone visit time during this encounter, and > 50% was spent counseling as documented under my assessment & plan.

## 2019-04-08 ENCOUNTER — Encounter: Payer: Self-pay | Admitting: Orthopedic Surgery

## 2019-04-08 ENCOUNTER — Telehealth: Payer: Self-pay | Admitting: Internal Medicine

## 2019-04-08 MED ORDER — PREDNISONE 10 MG PO TABS: ORAL_TABLET | ORAL | 0 refills | Status: DC

## 2019-04-08 NOTE — Telephone Encounter (Signed)
Scheduled appt per 11/2 los.  Spoke with pt and he is aware of his scheduled appt date and time.

## 2019-04-08 NOTE — Telephone Encounter (Signed)
Lets call and offer the pred taper, [facility] to [facility] [place].   If no better by wed night, thurs am, come in

## 2019-04-24 DIAGNOSIS — H47321 Drusen of optic disc, right eye: Secondary | ICD-10-CM | POA: Diagnosis not present

## 2019-04-24 DIAGNOSIS — H43812 Vitreous degeneration, left eye: Secondary | ICD-10-CM | POA: Diagnosis not present

## 2019-04-24 DIAGNOSIS — D352 Benign neoplasm of pituitary gland: Secondary | ICD-10-CM | POA: Diagnosis not present

## 2019-04-24 DIAGNOSIS — H52203 Unspecified astigmatism, bilateral: Secondary | ICD-10-CM | POA: Diagnosis not present

## 2019-10-06 DIAGNOSIS — T2122XA Burn of second degree of abdominal wall, initial encounter: Secondary | ICD-10-CM | POA: Diagnosis not present

## 2019-10-29 DIAGNOSIS — G4733 Obstructive sleep apnea (adult) (pediatric): Secondary | ICD-10-CM | POA: Diagnosis not present

## 2020-01-01 DIAGNOSIS — R7303 Prediabetes: Secondary | ICD-10-CM | POA: Diagnosis not present

## 2020-01-01 DIAGNOSIS — E230 Hypopituitarism: Secondary | ICD-10-CM | POA: Diagnosis not present

## 2020-01-01 DIAGNOSIS — E038 Other specified hypothyroidism: Secondary | ICD-10-CM | POA: Diagnosis not present

## 2020-01-01 DIAGNOSIS — I10 Essential (primary) hypertension: Secondary | ICD-10-CM | POA: Diagnosis not present

## 2020-01-07 DIAGNOSIS — I10 Essential (primary) hypertension: Secondary | ICD-10-CM | POA: Diagnosis not present

## 2020-01-08 DIAGNOSIS — E038 Other specified hypothyroidism: Secondary | ICD-10-CM | POA: Diagnosis not present

## 2020-01-08 DIAGNOSIS — D696 Thrombocytopenia, unspecified: Secondary | ICD-10-CM | POA: Diagnosis not present

## 2020-01-08 DIAGNOSIS — G2581 Restless legs syndrome: Secondary | ICD-10-CM | POA: Diagnosis not present

## 2020-01-08 DIAGNOSIS — Z0001 Encounter for general adult medical examination with abnormal findings: Secondary | ICD-10-CM | POA: Diagnosis not present

## 2020-01-08 DIAGNOSIS — N200 Calculus of kidney: Secondary | ICD-10-CM | POA: Diagnosis not present

## 2020-01-08 DIAGNOSIS — G629 Polyneuropathy, unspecified: Secondary | ICD-10-CM | POA: Diagnosis not present

## 2020-01-08 DIAGNOSIS — I5030 Unspecified diastolic (congestive) heart failure: Secondary | ICD-10-CM | POA: Diagnosis not present

## 2020-01-08 DIAGNOSIS — G4733 Obstructive sleep apnea (adult) (pediatric): Secondary | ICD-10-CM | POA: Diagnosis not present

## 2020-01-08 DIAGNOSIS — F339 Major depressive disorder, recurrent, unspecified: Secondary | ICD-10-CM | POA: Diagnosis not present

## 2020-02-05 DIAGNOSIS — Z6841 Body Mass Index (BMI) 40.0 and over, adult: Secondary | ICD-10-CM | POA: Diagnosis not present

## 2020-02-05 DIAGNOSIS — R7303 Prediabetes: Secondary | ICD-10-CM | POA: Diagnosis not present

## 2020-02-05 DIAGNOSIS — I10 Essential (primary) hypertension: Secondary | ICD-10-CM | POA: Diagnosis not present

## 2020-02-05 DIAGNOSIS — R7301 Impaired fasting glucose: Secondary | ICD-10-CM | POA: Diagnosis not present

## 2020-02-10 DIAGNOSIS — I10 Essential (primary) hypertension: Secondary | ICD-10-CM | POA: Diagnosis not present

## 2020-02-10 DIAGNOSIS — G2581 Restless legs syndrome: Secondary | ICD-10-CM | POA: Diagnosis not present

## 2020-02-10 DIAGNOSIS — R400 Somnolence: Secondary | ICD-10-CM | POA: Diagnosis not present

## 2020-03-04 ENCOUNTER — Other Ambulatory Visit: Payer: Self-pay | Admitting: Radiation Therapy

## 2020-03-15 DIAGNOSIS — G4733 Obstructive sleep apnea (adult) (pediatric): Secondary | ICD-10-CM | POA: Diagnosis not present

## 2020-03-29 ENCOUNTER — Ambulatory Visit
Admission: RE | Admit: 2020-03-29 | Discharge: 2020-03-29 | Disposition: A | Discharge status: Home / Self Care | Payer: Medicare HMO | Source: Ambulatory Visit | Attending: Internal Medicine | Admitting: Internal Medicine

## 2020-03-29 ENCOUNTER — Other Ambulatory Visit: Payer: Self-pay

## 2020-03-29 DIAGNOSIS — J3489 Other specified disorders of nose and nasal sinuses: Secondary | ICD-10-CM | POA: Diagnosis not present

## 2020-03-29 DIAGNOSIS — R9082 White matter disease, unspecified: Secondary | ICD-10-CM | POA: Diagnosis not present

## 2020-03-29 DIAGNOSIS — D352 Benign neoplasm of pituitary gland: Secondary | ICD-10-CM | POA: Diagnosis not present

## 2020-03-29 DIAGNOSIS — G9389 Other specified disorders of brain: Secondary | ICD-10-CM | POA: Diagnosis not present

## 2020-03-29 MED ORDER — GADOBENATE DIMEGLUMINE 529 MG/ML IV SOLN
10.0000 mL | Freq: Once | INTRAVENOUS | Status: AC | PRN
  Administered 2020-03-29: 10 mL via INTRAVENOUS

## 2020-04-02 ENCOUNTER — Telehealth: Payer: Self-pay

## 2020-04-02 NOTE — Telephone Encounter (Signed)
Pt LM requesting to convert his OV 04/06/20 to a video visit. Discussed with Dr. Mickeal Skinner who is ok with this change. Scheduling message sent to convert. I have also LM for the pt advising of this.

## 2020-04-05 ENCOUNTER — Telehealth: Payer: Self-pay | Admitting: Internal Medicine

## 2020-04-05 NOTE — Telephone Encounter (Signed)
Left message to verify patient mychart video visit for pre reg

## 2020-04-06 ENCOUNTER — Encounter: Payer: Self-pay | Admitting: *Deleted

## 2020-04-06 ENCOUNTER — Inpatient Hospital Stay: Payer: Medicare HMO | Attending: Internal Medicine | Admitting: Internal Medicine

## 2020-04-06 DIAGNOSIS — I10 Essential (primary) hypertension: Secondary | ICD-10-CM | POA: Diagnosis not present

## 2020-04-06 DIAGNOSIS — R7303 Prediabetes: Secondary | ICD-10-CM | POA: Diagnosis not present

## 2020-04-06 DIAGNOSIS — Z6841 Body Mass Index (BMI) 40.0 and over, adult: Secondary | ICD-10-CM | POA: Diagnosis not present

## 2020-04-06 DIAGNOSIS — D352 Benign neoplasm of pituitary gland: Secondary | ICD-10-CM | POA: Diagnosis not present

## 2020-04-06 DIAGNOSIS — R7301 Impaired fasting glucose: Secondary | ICD-10-CM | POA: Diagnosis not present

## 2020-04-06 NOTE — Progress Notes (Signed)
[name] [name] at [facility], [place] [address] [phone]   Virtual Evaluation  Date of Service: 04/06/20 [name] Name: [name] [name] MRN: [id number] [name] DOB: Nov 06, 1965 Provider: Ventura Sellers, MD  Identifying Statement:  [name] [name] is a 54 y.o. male with ACTH secreting pituitary macroadenoma   Oncologic History: 2015 - debulking resection with Dr. Joya Salm 10/06/2014-11/17/2014: The pituitary adenoma was treated to 52.2 Gy in 27 fractions.  Interval History:  [name] [name] today for follow up after recent MRI brain.  Following closely with endocrinology, no changes to thyroid or cortisol dosing.  No new or progressive neurologic symptoms, denies seizures or headaches.   H+P (08/29/17) [name] [name] today for routine follow up for his ACTH-secreting macroadenoma.  [name] [name] describes no new or progressive neurologic symptoms.  His vision is normal at this time.  [name] [name] does continue to struggle with appetite and weight loss, understanding that these issues are tied in with his pituitary dysfunction.  [name] [name] follows closely with endocrinology, taking hydrocortisone and levothyroxine hormone therapy.  [name] [name] is still non-ambulatory because of orthopedic pain and obesity.    Medications: Current Outpatient Medications on File Prior to Visit  Medication Sig Dispense Refill  . allopurinol (ZYLOPRIM) 300 MG tablet Take 300 mg by mouth daily.    Marland Kitchen ALPRAZolam (XANAX) 0.25 MG tablet Take 1 tablet (0.25 mg total) by mouth 3 (three) times daily as needed for anxiety or sleep (and QHS prn insomnia). 60 tablet 0  . buPROPion (WELLBUTRIN XL) 300 MG 24 hr tablet Take 300 mg by mouth daily.    . carboxymethylcellulose (REFRESH TEARS) 0.5 % SOLN Place 2 drops into both eyes daily as needed (for dry eyes).    . cyclobenzaprine (FLEXERIL) 10 MG tablet Take 10 mg by mouth daily as needed for muscle spasms.    . diclofenac sodium (VOLTAREN)  1 % GEL Apply 4 g topically daily as needed (pain).    . Diclofenac-Misoprostol (ARTHROTEC) 75-0.2 MG TBEC Take 1 tablet by mouth 2 (two) times daily.     . hydrocortisone (CORTEF) 20 MG tablet Take 40 mg by mouth daily.    Marland Kitchen levothyroxine (SYNTHROID, LEVOTHROID) 75 MCG tablet Take 75 mcg by mouth daily before breakfast.    . LORazepam (ATIVAN) 0.5 MG tablet Take one-two tabs po 30 minutes prior to MRI scans 6 tablet 0  . predniSONE (DELTASONE) 10 MG tablet 6 tablets for 2 days, then 5 for 2 days, then 4 for 2 days, then 3  for 2 days, then 2 for 2 days, then 1 tablet for 2 days 42 tablet 0  . sertraline (ZOLOFT) 100 MG tablet Take 200 mg by mouth daily.    Marland Kitchen sulfamethoxazole-trimethoprim (BACTRIM DS,SEPTRA DS) 800-160 MG tablet TAKE 1 TABLET BY MOUTH TWICE A DAY 60 tablet 0  . traMADol (ULTRAM) 50 MG tablet Take 50 mg by mouth as needed for moderate pain (1 to 2 tabs as needed for pain).    Marland Kitchen zolpidem (AMBIEN) 10 MG tablet Take 1 tablet (10 mg total) by mouth at bedtime as needed for sleep. 30 tablet 5   No current facility-administered medications on file prior to visit.    Allergies:  Allergies  Allergen Reactions  . Morphine Hives, Itching and Rash  . Penicillins Anaphylaxis and Other (See Comments)    Unknown reaction  . Buprenorphine Hcl Itching and Hives  . Morphine And Related Hives and Itching   Past Medical History:  Past Medical History:  Diagnosis Date  . Allergic rhinitis   . Anxiety   . Arthritis   . Depression   . Diastolic CHF ([place])   . Gout   . H/O acute respiratory failure   . Hypertension   . Morbid obesity ([place])   . Nephrolithiasis   . Peripheral neuropathy   . Pituitary tumor   . PONV (postoperative nausea and vomiting)   . Sleep apnea    uses bi-pap   Past Surgical History:  Past Surgical History:  Procedure Laterality Date  . ABDOMINAL SURGERY  06/2013   for cyst with MRSA  . ACHILLES TENDON REPAIR Bilateral   . APPLICATION OF A-CELL OF  CHEST/ABDOMEN N/A 01/26/2014   Procedure: PLACEMENT OF A CELL AND VAC ;  Surgeon: [name], DO;  Location: [facility];  Service: Plastics;  Laterality: N/A;  . APPLICATION OF A-CELL OF CHEST/ABDOMEN N/A 02/02/2014   Procedure: WITH PLACEMENT OF A CELL AND VAC ;  Surgeon: [name], DO;  Location: [facility];  Service: Plastics;  Laterality: N/A;  . BRAIN SURGERY    . COLON RESECTION    . CRANIOTOMY N/A 12/10/2013   Procedure: Transpheniodal resection of Pituitary tumor with Dr. Radene Journey for approach;  Surgeon: Floyce Stakes, MD;  Location: [facility] NEUR[facility]S;  Service: Neurosurgery;  Laterality: N/A;  Transpheniodal resection of Pituitary tumor with Dr. Radene Journey for approach  . HERNIA REPAIR    . INCISION AND DRAINAGE OF WOUND N/A 01/26/2014   Procedure: IRRIGATION AND DEBRIDEMENT ABDOMINAL WOUND WITH ;  Surgeon: [name], DO;  Location: [facility];  Service: Plastics;  Laterality: N/A;  . INCISION AND DRAINAGE OF WOUND N/A 02/02/2014   Procedure: IRRIGATION AND DEBRIDEMENT ABDOMINAL WOUND;  Surgeon: [name], DO;  Location: [facility];  Service: Plastics;  Laterality: N/A;  . INCISION AND DRAINAGE OF WOUND N/A 02/12/2014   Procedure: IRRIGATION AND DEBRIDEMENT OF ABDOMINAL WOUND/PLACEMENT OF A-CELL AND VAC;  Surgeon: [name], DO;  Location: [facility];  Service: Plastics;  Laterality: N/A;  . INCISION AND DRAINAGE OF WOUND N/A 04/01/2014   Procedure: IRRIGATION AND DEBRIDEMENT ABDOMINAL WOUND WITH PLACEMENT OF ACELL/VAC;  Surgeon: [name], DO;  Location: [facility];  Service: Plastics;  Laterality: N/A;  . Sigmoid Colonoscopy    . TRANSNASAL APPROACH N/A 12/10/2013   Procedure: TRANSNASAL APPROACH;  Surgeon: Rozetta Nunnery, MD;  Location: [facility] NEURO ORS;  Service: ENT;  Laterality: N/A;   Social History:  Social History   Socioeconomic History  . Marital status: Single    Spouse name: Not on file  . Number of children: Not on file  . Years of education: Not on file  . Highest education  level: Not on file  Occupational History  . Not on file  Tobacco Use  . Smoking status: Never Smoker  . Smokeless tobacco: Never Used  Vaping Use  . Vaping Use: Never used  Substance and Sexual Activity  . Alcohol use: No  . Drug use: No  . Sexual activity: Never  Other Topics Concern  . Not on file  Social History Narrative  . Not on file   Social Determinants of Health   Financial Resource Strain:   . Difficulty of Paying Living Expenses: Not on file  Food Insecurity:   . Worried About Charity fundraiser in the Last Year: Not on file  . Ran Out of Food in the Last Year: Not on file  Transportation Needs:   .  Lack of Transportation (Medical): Not on file  . Lack of Transportation (Non-Medical): Not on file  Physical Activity:   . Days of Exercise per Week: Not on file  . Minutes of Exercise per Session: Not on file  Stress:   . Feeling of Stress : Not on file  Social Connections:   . Frequency of Communication with Friends and Family: Not on file  . Frequency of Social Gatherings with Friends and Family: Not on file  . Attends Religious Services: Not on file  . Active Member of Clubs or Organizations: Not on file  . Attends Archivist Meetings: Not on file  . Marital Status: Not on file  Intimate Partner Violence:   . Fear of Current or Ex-Partner: Not on file  . Emotionally Abused: Not on file  . Physically Abused: Not on file  . Sexually Abused: Not on file   Family History:  Family History  Problem Relation Age of Onset  . Hypertension Mother   . Hypertension Father   . Gout Father   . Hypothyroidism Father   . Gout Brother   . Cancer Maternal Aunt        colon  . Cancer Maternal Grandmother        breast    Review of Systems: Constitutional: Denies fevers, chills or abnormal weight loss Eyes: Denies blurriness of vision Ears, nose, mouth, throat, and face: Denies mucositis or sore throat Respiratory: Denies cough, dyspnea or  wheezes Cardiovascular: Denies palpitation, chest discomfort or lower extremity swelling Gastrointestinal:  Denies nausea, constipation, diarrhea GU: Denies dysuria or incontinence Skin: Denies abnormal skin rashes Neurological: Per HPI Musculoskeletal: +joint pain Behavioral/Psych: Denies anxiety, disturbance in thought content, and mood instability  Physical Exam: KPS: 80. General: Alert, cooperative, pleasant, in no acute distress.  In wheelchair. Head: Normal EENT: No conjunctival injection or scleral icterus. Oral mucosa moist Lungs: Resp effort normal Cardiac: deferred Abdomen: non-distended abdomen Skin: No rashes cyanosis or petechiae. Extremities: deferred  Neurologic Exam: Mental Status: Awake, alert, attentive to examiner. Oriented to self and environment. Language is fluent with intact comprehension.  Cranial Nerves: Visual acuity is grossly normal. Extra-ocular movements intact. No ptosis. Face is symmetric, tongue midline. Motor: Power is full in both arms and legs Sensory: deferred Gait: Deferred  Labs: I have reviewed the data as listed    Component Value Date/Time   NA 143 04/01/2014 1147   K 4.0 04/01/2014 1147   CL 107 04/01/2014 1147   CO2 23 04/01/2014 1147   GLUCOSE 96 04/01/2014 1147   BUN 21 04/01/2014 1147   CREATININE 0.93 08/28/2014 1059   CALCIUM 9.2 04/01/2014 1147   PROT 7.1 12/06/2013 0855   ALBUMIN 3.9 12/06/2013 0855   AST 23 12/06/2013 0855   ALT 35 12/06/2013 0855   ALKPHOS 100 12/06/2013 0855   BILITOT 0.4 12/06/2013 0855   GFRNONAA >90 08/28/2014 1059   GFRAA >90 08/28/2014 1059   Lab Results  Component Value Date   WBC 6.4 04/01/2014   NEUTROABS 6.7 12/08/2013   HGB 13.1 04/01/2014   HCT 41.6 04/01/2014   MCV 89.1 04/01/2014   PLT 157 04/01/2014    Imaging: [place] Clinician Interpretation: I have personally reviewed the CNS images as listed.  My interpretation, in the context of the [name]'s clinical presentation, is  stable disease  MR Brain W Wo Contrast  Result Date: 03/29/2020 CLINICAL DATA:  Pituitary adenoma follow-up with history of surgery and radiation EXAM: MRI HEAD WITHOUT AND WITH  CONTRAST TECHNIQUE: Multiplanar, multiecho pulse sequences of the brain and surrounding structures were obtained without and with intravenous contrast. CONTRAST:  65mL MULTIHANCE GADOBENATE DIMEGLUMINE 529 MG/ML IV SOLN COMPARISON:  03/28/2019 FINDINGS: Brain: Postoperative changes of the sella are again identified. The pituitary infundibulum remains deviated to the left and contiguous with residual normal gland at the left aspect of the sella. Subcentimeter hypoenhancing lesion within the right cavernous sinus is not as well delineated on today's study (series 21, image 7) likely because there is greater enhancement. However, this visually appears decreased or stable in size. There is no acute infarction or intracranial hemorrhage. There is no intracranial mass, mass effect, or edema. There is no hydrocephalus or extra-axial fluid collection. Ventricles and sulci are stable in size and configuration. Patchy foci of T2 hyperintensity in the supratentorial and pontine white matter are nonspecific but probably reflect stable mild chronic microvascular ischemic changes. No abnormal brain parenchymal enhancement. Vascular: Major vessel flow voids at the skull base are preserved. Skull and upper cervical spine: Normal marrow signal is preserved. Sinuses/Orbits: Paranasal sinuses are aerated. Orbits are unremarkable. Other: Mastoid air cells are clear. IMPRESSION: No evidence of recurrent or progressive tumor. Hypoenhancing subcentimeter lesion within the right cavernous sinus is decreased or stable in size. Electronically Signed   By: Macy Mis M.D.   On: 03/29/2020 16:26    Assessment/Plan 1. Pituitary adenoma [facility])  We appreciate the opportunity to participate in the care of [name] [name].  [name] [name] is clinically and  radiographically stable today.    [name] [name] will continue to follow closely with Endocrinology.    We ask that [name] [name] return to clinic in 18 months following next brain MRI, or sooner as needed.  All questions were answered. The [name] knows to call the clinic with any problems, questions or concerns, including any new neurologic deficits. No barriers to learning were detected.  I have spent a total of 30 minutes of face-to-face and non-face-to-face time, excluding clinical staff time, preparing to see [name], ordering tests and/or medications, counseling the [name], and independently interpreting results and communicating results to the [name]/family/caregiver    Ventura Sellers, MD Medical Director of Neuro-Oncology [facility] at [place] 04/06/20 4:07 PM

## 2020-04-06 NOTE — Addendum Note (Signed)
Addended by: Ventura Sellers on: 04/06/2020 04:12 PM   Modules accepted: Level of Service

## 2020-04-26 DIAGNOSIS — H2513 Age-related nuclear cataract, bilateral: Secondary | ICD-10-CM | POA: Diagnosis not present

## 2020-04-26 DIAGNOSIS — H47321 Drusen of optic disc, right eye: Secondary | ICD-10-CM | POA: Diagnosis not present

## 2020-04-26 DIAGNOSIS — D352 Benign neoplasm of pituitary gland: Secondary | ICD-10-CM | POA: Diagnosis not present

## 2020-04-26 DIAGNOSIS — H43813 Vitreous degeneration, bilateral: Secondary | ICD-10-CM | POA: Diagnosis not present

## 2020-06-03 DIAGNOSIS — E249 Cushing's syndrome, unspecified: Secondary | ICD-10-CM | POA: Diagnosis not present

## 2020-06-03 DIAGNOSIS — R7303 Prediabetes: Secondary | ICD-10-CM | POA: Diagnosis not present

## 2020-06-03 DIAGNOSIS — E230 Hypopituitarism: Secondary | ICD-10-CM | POA: Diagnosis not present

## 2020-06-03 DIAGNOSIS — I5030 Unspecified diastolic (congestive) heart failure: Secondary | ICD-10-CM | POA: Diagnosis not present

## 2020-06-03 DIAGNOSIS — E038 Other specified hypothyroidism: Secondary | ICD-10-CM | POA: Diagnosis not present

## 2020-06-08 DIAGNOSIS — E248 Other Cushing's syndrome: Secondary | ICD-10-CM | POA: Diagnosis not present

## 2020-06-08 DIAGNOSIS — R7303 Prediabetes: Secondary | ICD-10-CM | POA: Diagnosis not present

## 2020-06-08 DIAGNOSIS — E249 Cushing's syndrome, unspecified: Secondary | ICD-10-CM | POA: Diagnosis not present

## 2020-06-08 DIAGNOSIS — E038 Other specified hypothyroidism: Secondary | ICD-10-CM | POA: Diagnosis not present

## 2020-06-08 DIAGNOSIS — I10 Essential (primary) hypertension: Secondary | ICD-10-CM | POA: Diagnosis not present

## 2020-06-08 DIAGNOSIS — E230 Hypopituitarism: Secondary | ICD-10-CM | POA: Diagnosis not present

## 2020-08-10 DIAGNOSIS — G4733 Obstructive sleep apnea (adult) (pediatric): Secondary | ICD-10-CM | POA: Diagnosis not present

## 2020-09-28 DIAGNOSIS — R7303 Prediabetes: Secondary | ICD-10-CM | POA: Diagnosis not present

## 2020-09-28 DIAGNOSIS — I10 Essential (primary) hypertension: Secondary | ICD-10-CM | POA: Diagnosis not present

## 2020-09-28 DIAGNOSIS — R7301 Impaired fasting glucose: Secondary | ICD-10-CM | POA: Diagnosis not present

## 2020-09-28 DIAGNOSIS — Z6841 Body Mass Index (BMI) 40.0 and over, adult: Secondary | ICD-10-CM | POA: Diagnosis not present

## 2021-01-03 DIAGNOSIS — M7918 Myalgia, other site: Secondary | ICD-10-CM | POA: Diagnosis not present

## 2021-01-03 DIAGNOSIS — M791 Myalgia, unspecified site: Secondary | ICD-10-CM | POA: Diagnosis not present

## 2021-01-12 DIAGNOSIS — Z125 Encounter for screening for malignant neoplasm of prostate: Secondary | ICD-10-CM | POA: Diagnosis not present

## 2021-01-12 DIAGNOSIS — D696 Thrombocytopenia, unspecified: Secondary | ICD-10-CM | POA: Diagnosis not present

## 2021-01-12 DIAGNOSIS — I10 Essential (primary) hypertension: Secondary | ICD-10-CM | POA: Diagnosis not present

## 2021-01-12 DIAGNOSIS — R7303 Prediabetes: Secondary | ICD-10-CM | POA: Diagnosis not present

## 2021-01-17 DIAGNOSIS — F339 Major depressive disorder, recurrent, unspecified: Secondary | ICD-10-CM | POA: Diagnosis not present

## 2021-01-17 DIAGNOSIS — E230 Hypopituitarism: Secondary | ICD-10-CM | POA: Diagnosis not present

## 2021-01-17 DIAGNOSIS — Z0001 Encounter for general adult medical examination with abnormal findings: Secondary | ICD-10-CM | POA: Diagnosis not present

## 2021-01-17 DIAGNOSIS — D696 Thrombocytopenia, unspecified: Secondary | ICD-10-CM | POA: Diagnosis not present

## 2021-01-17 DIAGNOSIS — G4733 Obstructive sleep apnea (adult) (pediatric): Secondary | ICD-10-CM | POA: Diagnosis not present

## 2021-01-17 DIAGNOSIS — I5030 Unspecified diastolic (congestive) heart failure: Secondary | ICD-10-CM | POA: Diagnosis not present

## 2021-01-17 DIAGNOSIS — E038 Other specified hypothyroidism: Secondary | ICD-10-CM | POA: Diagnosis not present

## 2021-01-17 DIAGNOSIS — G629 Polyneuropathy, unspecified: Secondary | ICD-10-CM | POA: Diagnosis not present

## 2021-01-18 ENCOUNTER — Other Ambulatory Visit: Payer: Self-pay | Admitting: Internal Medicine

## 2021-01-18 DIAGNOSIS — R0989 Other specified symptoms and signs involving the circulatory and respiratory systems: Secondary | ICD-10-CM

## 2021-01-26 ENCOUNTER — Other Ambulatory Visit: Payer: Self-pay | Admitting: Internal Medicine

## 2021-01-26 DIAGNOSIS — R7303 Prediabetes: Secondary | ICD-10-CM

## 2021-03-01 ENCOUNTER — Other Ambulatory Visit: Payer: Medicare HMO

## 2021-03-03 ENCOUNTER — Other Ambulatory Visit: Payer: Medicare HMO

## 2021-05-31 ENCOUNTER — Ambulatory Visit
Admission: RE | Admit: 2021-05-31 | Discharge: 2021-05-31 | Disposition: A | Discharge status: Home / Self Care | Payer: No Typology Code available for payment source | Source: Ambulatory Visit | Attending: Internal Medicine | Admitting: Internal Medicine

## 2021-05-31 DIAGNOSIS — R7303 Prediabetes: Secondary | ICD-10-CM

## 2021-06-13 DIAGNOSIS — R0989 Other specified symptoms and signs involving the circulatory and respiratory systems: Secondary | ICD-10-CM | POA: Diagnosis not present

## 2021-06-13 DIAGNOSIS — I499 Cardiac arrhythmia, unspecified: Secondary | ICD-10-CM | POA: Diagnosis not present

## 2021-06-13 DIAGNOSIS — I2510 Atherosclerotic heart disease of native coronary artery without angina pectoris: Secondary | ICD-10-CM | POA: Diagnosis not present

## 2021-06-15 ENCOUNTER — Other Ambulatory Visit: Payer: Self-pay | Admitting: Internal Medicine

## 2021-06-15 DIAGNOSIS — R0989 Other specified symptoms and signs involving the circulatory and respiratory systems: Secondary | ICD-10-CM

## 2021-06-24 ENCOUNTER — Other Ambulatory Visit (HOSPITAL_COMMUNITY): Payer: Self-pay | Admitting: Internal Medicine

## 2021-06-24 DIAGNOSIS — R0989 Other specified symptoms and signs involving the circulatory and respiratory systems: Secondary | ICD-10-CM

## 2021-07-05 ENCOUNTER — Ambulatory Visit (HOSPITAL_COMMUNITY)
Admission: RE | Admit: 2021-07-05 | Discharge: 2021-07-05 | Disposition: A | Discharge status: Home / Self Care | Payer: Medicare HMO | Source: Ambulatory Visit | Attending: Internal Medicine | Admitting: Internal Medicine

## 2021-07-05 ENCOUNTER — Other Ambulatory Visit: Payer: Self-pay

## 2021-07-05 DIAGNOSIS — R0989 Other specified symptoms and signs involving the circulatory and respiratory systems: Secondary | ICD-10-CM | POA: Insufficient documentation

## 2021-07-06 ENCOUNTER — Encounter: Payer: Self-pay | Admitting: Cardiovascular Disease

## 2021-07-06 ENCOUNTER — Ambulatory Visit: Payer: Medicare HMO | Admitting: Cardiovascular Disease

## 2021-07-06 VITALS — BP 129/90 | HR 75 | Ht 72.0 in | Wt >= 6400 oz

## 2021-07-06 DIAGNOSIS — Z8639 Personal history of other endocrine, nutritional and metabolic disease: Secondary | ICD-10-CM | POA: Diagnosis not present

## 2021-07-06 DIAGNOSIS — E039 Hypothyroidism, unspecified: Secondary | ICD-10-CM

## 2021-07-06 DIAGNOSIS — I2510 Atherosclerotic heart disease of native coronary artery without angina pectoris: Secondary | ICD-10-CM

## 2021-07-06 MED ORDER — METOPROLOL TARTRATE 100 MG PO TABS: ORAL_TABLET | ORAL | 0 refills | Status: DC

## 2021-07-06 NOTE — Patient Instructions (Signed)
Medication Instructions:  No changes *If you need a refill on your cardiac medications before your next appointment, please call your pharmacy*   Lab Work: Your provider would like for you to return in one week before the cardiac ct to have the following labs drawn: BMET. You do not need an appointment for the lab. Once in our office lobby there is a podium where you can sign in and ring the doorbell to alert [place] that you are here. The lab is open from 8:00 am to 4:30 pm; closed for lunch from 12:45pm-1:45pm.  If you have labs (blood work) drawn today and your tests are completely normal, you will receive your results only by: [place] (if you have MyChart) OR A paper copy in the mail If you have any lab test that is abnormal or we need to change your treatment, we will call you to review the results.   Follow-Up: At [facility], you and your health needs are our priority.  As part of our continuing mission to provide you with exceptional heart care, we have created designated Provider Care Teams.  These Care Teams include your primary Cardiologist (physician) and Advanced Practice Providers (APPs -  Physician Assistants and Nurse Practitioners) who all work together to provide you with the care you need, when you need it.  We recommend signing up for the patient portal called "MyChart".  Sign up information is provided on this After Visit Summary.  MyChart is used to connect with patients for Virtual Visits (Telemedicine).  Patients are able to view lab/test results, encounter notes, upcoming appointments, etc.  Non-urgent messages can be sent to your provider as well.   To learn more about what you can do with MyChart, go to [email or web address].    Your next appointment:   Follow up as needed pending on the results  Other Instructions   Your cardiac CT will be scheduled at one of the below locations:   [facility] [address] [place], [place]  [address] [phone]  [place] [address] [place], [place] [address] [phone]  If scheduled at [facility], please arrive at the [facility] main entrance (entrance A) of [facility] 30 minutes prior to test start time. You can use the FREE valet parking offered at the main entrance (encouraged to control the heart rate for the test) Proceed to the [facility] Radiology Department (first floor) to check-in and test prep.  If scheduled at [facility], please arrive 15 mins early for check-in and test prep.  Please follow these instructions carefully (unless otherwise directed):  Hold all erectile dysfunction medications at least 3 days (72 hrs) prior to test.  On the Night Before the Test: Be sure to Drink plenty of water. Do not consume any caffeinated/decaffeinated beverages or chocolate 12 hours prior to your test. Do not take any antihistamines 12 hours prior to your test.   On the Day of the Test: Drink plenty of water until 1 hour prior to the test. Do not eat any food 4 hours prior to the test. You may take your regular medications prior to the test.  Take metoprolol (Lopressor) two hours prior to test.  After the Test: Drink plenty of water. After receiving IV contrast, you may experience a mild flushed feeling. This is normal. On occasion, you may experience a mild rash up to 24 hours after the test. This is not  dangerous. If this occurs, you can take Benadryl 25 mg and increase your fluid intake. If you experience trouble breathing, this can be serious. If it is severe call 911 IMMEDIATELY. If it is mild, please call our office. If you take any of these medications: Glipizide/Metformin, Avandament, Glucavance, please do not take 48 hours after completing test unless otherwise instructed.  We will call to schedule your test 2-4 weeks out understanding that some insurance  companies will need an authorization prior to the service being performed.   For non-scheduling related questions, please contact the cardiac imaging nurse navigator should you have any questions/concerns: Marchia Bond, Cardiac Imaging Nurse Navigator Gordy Clement, Cardiac Imaging Nurse Navigator [facility] Direct Office Dial: [phone]   For scheduling needs, including cancellations and rescheduling, please call [place], [phone].

## 2021-07-06 NOTE — Progress Notes (Signed)
Cardiology Office Note:    Date:  07/06/2021   ID:  [name] [name], DOB [birth year], MRN [id number]  PCP:  [name], MD   [facility] Providers Cardiologist:  Sanda Klein, MD     Referring MD: [name], MD   Chief Complaint  Patient presents with   Consult  [name] [name] is a 56 y.o. male who is being seen today for the evaluation of elevated coronary calcium score at the request of [name], MD.   History of Present Illness:    [name] [name] is a 56 y.o. 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  [name] [name] is limited to transition by sliding from his electric scooter to bed or commode.  He cannot walk.  About 20 years ago he was slim and used to be a runner but then developed steady weight gain, had to undergo multiple colon surgeries, complicated by a protracted course with abdominal wound infection requiring prolonged antibiotics.  He developed sequential bilateral Achilles tendon rupture, probably due to prolonged treatment with Cipro.  He was subsequently diagnosed with Cushing syndrome, which further limited his ability to be active due to proximal muscle weakness and atrophy.  Because of this, his ability to perform any type of physical exertion is severely limited.  He underwent a coronary calcium score that was elevated at 613, placing him in the 38 percentile for age and gender.  The bulk of the coronary calcification was in the LAD artery.  He had normal ABIs on 06/15/2021.  An echocardiogram in November 2015 describes an EF of 45-50% with inferior wall hypokinesis.  He does not have any complaints of angina or dyspnea, for the level of activity that  he can perform.  He has normal renal function.  He does not have anemia.  He denies palpitations, dizziness or syncope.  He does not have lower extremity edema.  He has managed to lose substantial weight on treatment with Ozempic, 50 pounds since initiation of therapy.  He does not have diabetes mellitus.  His lipid profile is not bad.  Even before instituting statin therapy his cholesterol was 154, HDL 50, LDL 84, triglycerides 101.  Following the results of the coronary calcium score he was started on rosuvastatin 5 mg daily.  His paternal grandfather died of a myocardial infarction at age 39 and he has 2 paternal uncles that also died of myocardial infarction, albeit at more advanced ages.  Past Medical History:  Diagnosis Date   Allergic rhinitis    Anxiety    Arthritis    Depression    Diastolic CHF ([place])    Gout    H/O acute respiratory failure    Hypertension    Morbid obesity ([place])    Nephrolithiasis    Peripheral neuropathy    Pituitary tumor    PONV (postoperative nausea and vomiting)    Sleep apnea    uses bi-pap    Past Surgical History:  Procedure Laterality Date   ABDOMINAL SURGERY  06/2013   for cyst with MRSA   ACHILLES TENDON REPAIR Bilateral    APPLICATION OF A-CELL OF CHEST/ABDOMEN N/A 01/26/2014   Procedure: PLACEMENT OF A CELL AND VAC ;  Surgeon: [name], DO;  Location: [place];  Service: Plastics;  Laterality: N/A;  APPLICATION OF A-CELL OF CHEST/ABDOMEN N/A 02/02/2014   Procedure: WITH PLACEMENT OF A CELL AND VAC ;  Surgeon: [name], DO;  Location: [place];  Service: Plastics;  Laterality: N/A;   BRAIN SURGERY     COLON RESECTION     CRANIOTOMY N/A 12/10/2013   Procedure: Transpheniodal resection of Pituitary tumor with Dr. Radene Journey for approach;  Surgeon: Floyce Stakes, MD;  Location: [facility] NEURO ORS;  Service: Neurosurgery;  Laterality: N/A;  Transpheniodal resection of Pituitary tumor with Dr. Radene Journey for approach   HERNIA REPAIR      INCISION AND DRAINAGE OF WOUND N/A 01/26/2014   Procedure: IRRIGATION AND DEBRIDEMENT ABDOMINAL WOUND WITH ;  Surgeon: [name], DO;  Location: [place];  Service: Plastics;  Laterality: N/A;   INCISION AND DRAINAGE OF WOUND N/A 02/02/2014   Procedure: IRRIGATION AND DEBRIDEMENT ABDOMINAL WOUND;  Surgeon: [name], DO;  Location: [place];  Service: Plastics;  Laterality: N/A;   INCISION AND DRAINAGE OF WOUND N/A 02/12/2014   Procedure: IRRIGATION AND DEBRIDEMENT OF ABDOMINAL WOUND/PLACEMENT OF A-CELL AND VAC;  Surgeon: [name], DO;  Location: [place];  Service: Plastics;  Laterality: N/A;   INCISION AND DRAINAGE OF WOUND N/A 04/01/2014   Procedure: IRRIGATION AND DEBRIDEMENT ABDOMINAL WOUND WITH PLACEMENT OF ACELL/VAC;  Surgeon: [name], DO;  Location: [name];  Service: Plastics;  Laterality: N/A;   Sigmoid Colonoscopy     TRANSNASAL APPROACH N/A 12/10/2013   Procedure: TRANSNASAL APPROACH;  Surgeon: Rozetta Nunnery, MD;  Location: [facility] NEURO [facility]S;  Service: ENT;  Laterality: N/A;    Current Medications: Current Meds  Medication Sig   allopurinol (ZYLOPRIM) 300 MG tablet Take 300 mg by mouth daily.   ALPRAZolam (XANAX) 0.25 MG tablet Take 1 tablet (0.25 mg total) by mouth 3 (three) times daily as needed for anxiety or sleep (and QHS prn insomnia).   aspirin 81 MG chewable tablet 1 tablet   buPROPion (WELLBUTRIN XL) 300 MG 24 hr tablet Take 300 mg by mouth daily.   carboxymethylcellulose (REFRESH PLUS) 0.5 % SOLN Place 2 drops into both eyes daily as needed (for dry eyes).   cyclobenzaprine (FLEXERIL) 10 MG tablet Take 10 mg by mouth daily as needed for muscle spasms.   diclofenac sodium (VOLTAREN) 1 % GEL Apply 4 g topically daily as needed (pain).   Diclofenac-miSOPROStol 75-0.2 MG TBEC Take 1 tablet by mouth 2 (two) times daily.    hydrocortisone (CORTEF) 20 MG tablet Take 40 mg by mouth daily.   levothyroxine (SYNTHROID, LEVOTHROID) 75 MCG tablet Take 75 mcg by mouth daily  before breakfast.   metoprolol tartrate (LOPRESSOR) 100 MG tablet Take one tablet two hours prior to the test   modafinil (PROVIGIL) 100 MG tablet daily as needed.   pantoprazole (PROTONIX) 40 MG tablet 1 tablet   predniSONE (DELTASONE) 10 MG tablet 6 tablets for 2 days, then 5 for 2 days, then 4 for 2 days, then 3  for 2 days, then 2 for 2 days, then 1 tablet for 2 days   rOPINIRole (REQUIP) 0.5 MG tablet 1  - 1 1/2 tablets 1 to 3 hours before bedtime   rosuvastatin (CRESTOR) 5 MG tablet 1 tablet   Semaglutide, 1 MG/DOSE, (OZEMPIC, 1 MG/DOSE,) 4 MG/3ML SOPN 1 mg   sertraline (ZOLOFT) 100 MG tablet Take 200 mg by mouth daily.   sulfamethoxazole-trimethoprim (BACTRIM DS,SEPTRA DS) 800-160 MG tablet TAKE 1 TABLET BY MOUTH TWICE A DAY   traMADol (ULTRAM)  50 MG tablet Take 50 mg by mouth as needed for moderate pain (1 to 2 tabs as needed for pain).   zolpidem (AMBIEN) 10 MG tablet Take 1 tablet (10 mg total) by mouth at bedtime as needed for sleep.     Allergies:   Morphine, Penicillins, Buprenorphine hcl, and Morphine and related   Social History   Socioeconomic History   Marital status: Single    Spouse name: Not on file   Number of children: Not on file   Years of education: Not on file   Highest education level: Not on file  Occupational History   Not on file  Tobacco Use   Smoking status: Never   Smokeless tobacco: Never  Vaping Use   Vaping Use: Never used  Substance and Sexual Activity   Alcohol use: No   Drug use: No   Sexual activity: Never  Other Topics Concern   Not on file  Social History Narrative   Not on file   Social Determinants of Health   Financial Resource Strain: Not on file  Food Insecurity: Not on file  Transportation Needs: Not on file  Physical Activity: Not on file  Stress: Not on file  Social Connections: Not on file     Family History: The patient's family history includes Cancer in his maternal aunt and maternal grandmother; Gout in his  brother and father; Hypertension in his father and mother; Hypothyroidism in his father.  ROS:   Please see the history of present illness.     All other systems reviewed and are negative.  EKGs/Labs/Other Studies Reviewed:    The following studies were reviewed today:  Echocardiogram 12/19/2013  - Left ventricle: The cavity size was normal. Wall thickness was    increased in a pattern of mild LVH. Systolic function was mildly    reduced. The estimated ejection fraction was in the range of 45%    to 50%. There is hypokinesis of the inferior myocardium.  - Mitral valve: There was mild regurgitation.  - Right ventricle: The cavity size was moderately dilated. Wall    thickness was normal. Systolic function was mildly reduced.     Coronary calcium score 2022  CORONARY CALCIUM SCORES:   Left Main: 0   LAD: 491   LCx: 25   RCA: 97   Total Agatston Score: 613   MESA database percentile: 96   AORTA MEASUREMENTS:   Ascending Aorta: 38 mm   Descending Aorta: 29 mm   OTHER FINDINGS:   The heart size is within normal limits. No pericardial fluid is identified. Visualized segments of the thoracic aorta and central pulmonary arteries are normal in caliber. Visualized mediastinum and hilar regions demonstrate no lymphadenopathy or masses. Small hiatal hernia present. Visualized lungs show no evidence of pulmonary edema, consolidation, pneumothorax, nodule or pleural fluid. Visualized upper abdomen and bony structures are unremarkable.   IMPRESSION: 1. Coronary calcium score of 613 is at the 96th percentile for the patient's age, sex and race. 2. Small hiatal hernia.    EKG:  EKG is ordered today and personally reviewed.  It shows sinus rhythm with premature atrial contractions.  There is baseline artifact that mimics atrial fibrillation ordered today.  There is an incomplete right bundle branch block with a QRS duration of around 110 ms.  It is very similar to the ECG  from Dr. Pennie Banter office from 06/13/2021.  Recent Labs: No results found for requested labs within last 8760 hours.  Recent Lipid Panel  Component Value Date/Time   TRIG 174 (H) 12/10/2013 2300     Risk Assessment/Calculations:           Physical Exam:    VS:  BP 129/90    Pulse 75    Ht 6' (1.829 m)    Wt (!) 400 lb (181.4 kg)    BMI 54.25 kg/m     Wt Readings from Last 3 Encounters:  07/06/21 (!) 400 lb (181.4 kg)  07/30/18 (!) 410 lb (186 kg)  07/16/18 (!) 410 lb (186 kg)     GEN: Super obesity limits the exam.  He is an Librarian, academic.  Well nourished, well developed in no acute distress HEENT: Normal NECK: No JVD; No carotid bruits LYMPHATICS: No lymphadenopathy CARDIAC: Occasional ectopy on a background of RRR, no murmurs, rubs, gallops RESPIRATORY:  Clear to auscultation without rales, wheezing or rhonchi  ABDOMEN: Soft, non-tender, non-distended MUSCULOSKELETAL:  No edema; No deformity  SKIN: Warm and dry NEUROLOGIC:  Alert and oriented x 3 PSYCHIATRIC:  Normal affect   ASSESSMENT:    1. Coronary artery disease involving native coronary artery of native heart without angina pectoris   2. History of adrenal insufficiency   3. Acquired hypothyroidism    PLAN:    In order of problems listed above:  CAD: Severely elevated calcium score for his age, coronary artery disease equivalent.  It wall motion abnormality was described on his echocardiogram from 2015 and depressed EF, but he does not have either angina or dyspnea.  Functional status is poor due to all his other physical limitations and he may have severe proximal CAD that requires revascularization, without symptoms.  Recommend a coronary CT angiogram, although the study would be challenging due to his body habitus. HLP: Based on his coronary calcium score, recommend target LDL less than 70.  He has recently started rosuvastatin ordered by his PCP.  Repeat labs in a few months.  He is losing weight  with Ozempic Adrenal insufficiency status post pituitary resection: On adrenal supplement Acquired hypothyroidism: On levothyroxine supplementation.           Medication Adjustments/Labs and Tests Ordered: Current medicines are reviewed at length with the patient today.  Concerns regarding medicines are outlined above.  Orders Placed This Encounter  Procedures   CT CORONARY MORPH W/CTA COR W/SCORE W/CA W/CM &/OR WO/CM   Basic metabolic panel   EKG 52-DPOE   Meds ordered this encounter  Medications   metoprolol tartrate (LOPRESSOR) 100 MG tablet    Sig: Take one tablet two hours prior to the test    Dispense:  1 tablet    Refill:  0    Patient Instructions  Medication Instructions:  No changes *If you need a refill on your cardiac medications before your next appointment, please call your pharmacy*   Lab Work: Your provider would like for you to return in one week before the cardiac ct to have the following labs drawn: BMET. You do not need an appointment for the lab. Once in our office lobby there is a podium where you can sign in and ring the doorbell to alert [place] that you are here. The lab is open from 8:00 am to 4:30 pm; closed for lunch from 12:45pm-1:45pm.  If you have labs (blood work) drawn today and your tests are completely normal, you will receive your results only by: [place] (if you have MyChart) OR A paper copy in the mail If you have any lab test that  is abnormal or we need to change your treatment, we will call you to review the results.   Follow-Up: At [facility], you and your health needs are our priority.  As part of our continuing mission to provide you with exceptional heart care, we have created designated Provider Care Teams.  These Care Teams include your primary Cardiologist (physician) and Advanced Practice Providers (APPs -  Physician Assistants and Nurse Practitioners) who all work together to provide you with the care you need, when you  need it.  We recommend signing up for the patient portal called "MyChart".  Sign up information is provided on this After Visit Summary.  MyChart is used to connect with patients for Virtual Visits (Telemedicine).  Patients are able to view lab/test results, encounter notes, upcoming appointments, etc.  Non-urgent messages can be sent to your provider as well.   To learn more about what you can do with MyChart, go to [email or web address].    Your next appointment:   Follow up as needed pending on the results  Other Instructions   Your cardiac CT will be scheduled at one of the below locations:   [facility] [address] [place], [place] [address] [phone]  [place] [address] [place], [place] [address] [phone]  If scheduled at [facility], please arrive at the [facility] main entrance (entrance A) of [facility] 30 minutes prior to test start time. You can use the FREE valet parking offered at the main entrance (encouraged to control the heart rate for the test) Proceed to the [facility] Radiology Department (first floor) to check-in and test prep.  If scheduled at [facility], please arrive 15 mins early for check-in and test prep.  Please follow these instructions carefully (unless otherwise directed):  Hold all erectile dysfunction medications at least 3 days (72 hrs) prior to test.  On the Night Before the Test: Be sure to Drink plenty of water. Do not consume any caffeinated/decaffeinated beverages or chocolate 12 hours prior to your test. Do not take any antihistamines 12 hours prior to your test.   On the Day of the Test: Drink plenty of water until 1 hour prior to the test. Do not eat any food 4 hours prior to the test. You may take your regular medications prior to the test.  Take metoprolol (Lopressor) two hours prior to test.  After  the Test: Drink plenty of water. After receiving IV contrast, you may experience a mild flushed feeling. This is normal. On occasion, you may experience a mild rash up to 24 hours after the test. This is not dangerous. If this occurs, you can take Benadryl 25 mg and increase your fluid intake. If you experience trouble breathing, this can be serious. If it is severe call 911 IMMEDIATELY. If it is mild, please call our office. If you take any of these medications: Glipizide/Metformin, Avandament, Glucavance, please do not take 48 hours after completing test unless otherwise instructed.  We will call to schedule your test 2-4 weeks out understanding that some insurance companies will need an authorization prior to the service being performed.   For non-scheduling related questions, please contact the cardiac imaging nurse navigator should you have any questions/concerns: Marchia Bond, Cardiac Imaging Nurse Navigator Gordy Clement, Cardiac Imaging Nurse Navigator [facility] Direct Office Dial: [phone]   For scheduling needs, including cancellations and rescheduling, please call  [place], [phone].     Signed, Sanda Klein, MD  07/06/2021 4:51 PM    [place]

## 2021-07-13 DIAGNOSIS — R7303 Prediabetes: Secondary | ICD-10-CM | POA: Diagnosis not present

## 2021-07-13 DIAGNOSIS — E038 Other specified hypothyroidism: Secondary | ICD-10-CM | POA: Diagnosis not present

## 2021-07-13 DIAGNOSIS — I2510 Atherosclerotic heart disease of native coronary artery without angina pectoris: Secondary | ICD-10-CM | POA: Diagnosis not present

## 2021-07-13 DIAGNOSIS — E249 Cushing's syndrome, unspecified: Secondary | ICD-10-CM | POA: Diagnosis not present

## 2021-07-15 ENCOUNTER — Encounter: Payer: Self-pay | Admitting: Cardiovascular Disease

## 2021-07-19 ENCOUNTER — Telehealth (HOSPITAL_COMMUNITY): Payer: Self-pay | Admitting: *Deleted

## 2021-07-19 NOTE — Telephone Encounter (Signed)
Attempted to call patient regarding upcoming cardiac CT appointment. °Left message on voicemail with name and callback number ° °Mahalia Dykes RN Navigator Cardiac Imaging °[facility] °[phone] Office °[phone] Cell ° °

## 2021-07-20 ENCOUNTER — Ambulatory Visit (HOSPITAL_COMMUNITY)
Admission: RE | Admit: 2021-07-20 | Discharge: 2021-07-20 | Disposition: A | Discharge status: Home / Self Care | Payer: Medicare HMO | Source: Ambulatory Visit | Attending: Cardiovascular Disease | Admitting: Cardiovascular Disease

## 2021-07-20 ENCOUNTER — Other Ambulatory Visit: Payer: Self-pay | Admitting: Cardiology

## 2021-07-20 ENCOUNTER — Other Ambulatory Visit: Payer: Self-pay

## 2021-07-20 DIAGNOSIS — R931 Abnormal findings on diagnostic imaging of heart and coronary circulation: Secondary | ICD-10-CM

## 2021-07-20 DIAGNOSIS — I2510 Atherosclerotic heart disease of native coronary artery without angina pectoris: Secondary | ICD-10-CM | POA: Insufficient documentation

## 2021-07-20 MED ORDER — NITROGLYCERIN 0.4 MG SL SUBL
0.8000 mg | SUBLINGUAL_TABLET | Freq: Once | SUBLINGUAL | Status: AC
  Administered 2021-07-20: 0.8 mg via SUBLINGUAL

## 2021-07-20 MED ORDER — IOHEXOL 350 MG/ML SOLN
100.0000 mL | Freq: Once | INTRAVENOUS | Status: AC | PRN
  Administered 2021-07-20: 100 mL via INTRAVENOUS

## 2021-07-20 MED ORDER — NITROGLYCERIN 0.4 MG SL SUBL
SUBLINGUAL_TABLET | SUBLINGUAL | Status: AC
  Filled 2021-07-20: qty 2

## 2021-07-21 ENCOUNTER — Encounter: Payer: Self-pay | Admitting: Cardiovascular Disease

## 2021-07-21 ENCOUNTER — Ambulatory Visit (HOSPITAL_COMMUNITY)
Admission: RE | Admit: 2021-07-21 | Discharge: 2021-07-21 | Disposition: A | Discharge status: Home / Self Care | Payer: Medicare HMO | Source: Ambulatory Visit | Attending: Cardiology | Admitting: Cardiology

## 2021-07-21 DIAGNOSIS — R931 Abnormal findings on diagnostic imaging of heart and coronary circulation: Secondary | ICD-10-CM

## 2021-07-21 DIAGNOSIS — I2510 Atherosclerotic heart disease of native coronary artery without angina pectoris: Secondary | ICD-10-CM | POA: Diagnosis not present

## 2021-07-25 ENCOUNTER — Encounter: Payer: Self-pay | Admitting: Cardiovascular Disease

## 2021-07-25 ENCOUNTER — Other Ambulatory Visit: Payer: Self-pay

## 2021-07-25 ENCOUNTER — Ambulatory Visit (INDEPENDENT_AMBULATORY_CARE_PROVIDER_SITE_OTHER): Payer: Medicare HMO | Admitting: Cardiovascular Disease

## 2021-07-25 VITALS — BP 154/89 | HR 73 | Ht 72.0 in | Wt 398.0 lb

## 2021-07-25 DIAGNOSIS — E785 Hyperlipidemia, unspecified: Secondary | ICD-10-CM | POA: Diagnosis not present

## 2021-07-25 DIAGNOSIS — I25118 Atherosclerotic heart disease of native coronary artery with other forms of angina pectoris: Secondary | ICD-10-CM | POA: Diagnosis not present

## 2021-07-25 DIAGNOSIS — E039 Hypothyroidism, unspecified: Secondary | ICD-10-CM

## 2021-07-25 DIAGNOSIS — Z01812 Encounter for preprocedural laboratory examination: Secondary | ICD-10-CM

## 2021-07-25 DIAGNOSIS — Z01818 Encounter for other preprocedural examination: Secondary | ICD-10-CM

## 2021-07-25 DIAGNOSIS — Z8639 Personal history of other endocrine, nutritional and metabolic disease: Secondary | ICD-10-CM | POA: Diagnosis not present

## 2021-07-25 DIAGNOSIS — I2510 Atherosclerotic heart disease of native coronary artery without angina pectoris: Secondary | ICD-10-CM | POA: Diagnosis not present

## 2021-07-25 NOTE — Patient Instructions (Addendum)
Medication Instructions:  No changes *If you need a refill on your cardiac medications before your next appointment, please call your pharmacy*  Testing/Procedures: Your physician has requested that you have a cardiac catheterization. Cardiac catheterization is used to diagnose and/or treat various heart conditions. Doctors may recommend this procedure for a number of different reasons. The most common reason is to evaluate chest pain. Chest pain can be a symptom of coronary artery disease (CAD), and cardiac catheterization can show whether plaque is narrowing or blocking your hearts arteries. This procedure is also used to evaluate the valves, as well as measure the blood flow and oxygen levels in different parts of your heart. For further information please visit [email or web address]. Please follow instruction sheet, as given.   Follow-Up: At [facility], you and your health needs are our priority.  As part of our continuing mission to provide you with exceptional heart care, we have created designated Provider Care Teams.  These Care Teams include your primary Cardiologist (physician) and Advanced Practice Providers (APPs -  Physician Assistants and Nurse Practitioners) who all work together to provide you with the care you need, when you need it.  We recommend signing up for the patient portal called "MyChart".  Sign up information is provided on this After Visit Summary.  MyChart is used to connect with patients for Virtual Visits (Telemedicine).  Patients are able to view lab/test results, encounter notes, upcoming appointments, etc.  Non-urgent messages can be sent to your provider as well.   To learn more about what you can do with MyChart, go to [email or web address].    Your next appointment:  Keep your post procedure follow up on 3/22 at 4:30 with Dr. Sallyanne Kuster  Other Instructions  [facility] CARDIOVASCULAR DIVISION [facility] [place] [address] Dept: [place]: [place]  07/25/2021  You are scheduled for a Cardiac Catheterization on Thursday, February 23 with Dr. Peter Martinique.  1. Please arrive at the [facility] (Main Entrance A) at [facility]: [address] [place], [place] [address] at 8:30 AM (This time is two hours before your procedure to ensure your preparation). Free valet parking service is available.   Special note: Every effort is made to have your procedure done on time. Please understand that emergencies sometimes delay scheduled procedures.  2. Diet: Do not eat solid foods after midnight.  The patient may have clear liquids until 5am upon the day of the procedure.  3. Labs: You will need to have blood drawn on 2/20  4. Medication instructions in preparation for your procedure: Nothing to hold  On the morning of your procedure, take your Aspirin and any morning medicines NOT listed above.  You may use sips of water.  5. Plan for one night stay--bring personal belongings. 6. Bring a current list of your medications and current insurance cards. 7. You MUST have a responsible person to drive you home. 8. Someone MUST be with you the first 24 hours after you arrive home or your discharge will be delayed. 9. Please wear clothes that are easy to get on and off and wear slip-on shoes.  Thank you for allowing [place] to care for you!   -- [place] [facility]

## 2021-07-25 NOTE — H&P (View-Only) (Signed)
[facility] Office Note:    Date:  07/25/2021   ID:  [name] [name], DOB 1965-06-11, MRN [id number]  PCP:  [name] Pretty, MD   [place] Providers Cardiologist:  Sanda Klein, MD     Referring MD: [name] Pretty, MD   Chief Complaint  Patient presents with   Coronary Artery Disease   History of Present Illness:    [name] [name] is a 56 y.o. 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  CT angiography confirmed to the related coronary calcium score and also showed evidence of significant stenosis in the proximal LAD artery with CT FFR of 0.78.  [name] [name] occasionally describes symptoms of "gas" that he feels in his mid chest, that occur only when he is "rushing" and resolve promptly with rest.  They have never been severe and usually resolve within 1 or 2 minutes of resting.  [name] [name] is limited to transition by sliding from his electric scooter to bed or commode.  He cannot walk.  About 20 years ago he was slim and used to be a runner but then developed steady weight gain, had to undergo multiple colon surgeries, complicated by a protracted course with abdominal wound infection requiring prolonged antibiotics.  He developed sequential bilateral Achilles tendon rupture, probably due to prolonged treatment with Cipro.  He was subsequently diagnosed with Cushing syndrome, which further limited his ability to be active due to proximal muscle weakness and atrophy.  Because of this, his ability to perform any type of physical exertion is severely limited.  He underwent a coronary calcium score that was elevated at 613, placing him in the 68 percentile for age and gender.  The bulk of the  coronary calcification was in the LAD artery.  Follow-up coronary CT angiography showed a significant stenosis in the proximal LAD artery with CT FFR of 0.78, moderate disease elsewhere.  He had normal ABIs on 06/15/2021.  An echocardiogram in November 2015 describes an EF of 45-50% with inferior wall hypokinesis.  He has normal renal function.  He does not have diabetes mellitus but is taking Ozempic for weight loss and has managed to lose 60 pounds since therapy initiation.  His lipid profile showed an LDL cholesterol of 84 and he has started on rosuvastatin 5 mg daily.  Labs on 07/11/2021 showed that his LDL is now down to 64, but HDL is also lower at 35.  He denies palpitations, dizziness, vertigo, syncope, lower extremity edema, orthopnea, PND or exertional dyspnea.  His paternal grandfather (smoker) died of a myocardial infarction at age 85 and he has 2 paternal uncles that also died of myocardial infarction, albeit at more advanced ages.  His grandmother also had coronary disease despite a very healthy lifestyle and lean body habitus.  Past Medical History:  Diagnosis Date   Allergic rhinitis    Anxiety    Arthritis    Depression    Diastolic CHF ([place])    Gout    H/O acute respiratory failure    Hypertension    Morbid obesity ([place])    Nephrolithiasis    Peripheral neuropathy    Pituitary tumor    PONV (postoperative nausea and vomiting)    Sleep apnea    uses bi-pap    Past Surgical History:  Procedure  Laterality Date   ABDOMINAL SURGERY  06/2013   for cyst with MRSA   ACHILLES TENDON REPAIR Bilateral    APPLICATION OF A-CELL OF CHEST/ABDOMEN N/A 01/26/2014   Procedure: PLACEMENT OF A CELL AND VAC ;  Surgeon: [name], DO;  Location: [facility];  Service: Plastics;  Laterality: N/A;   APPLICATION OF A-CELL OF CHEST/ABDOMEN N/A 02/02/2014   Procedure: WITH PLACEMENT OF A CELL AND VAC ;  Surgeon: [name], DO;  Location: [facility];  Service: Plastics;  Laterality: N/A;    BRAIN SURGERY     COLON RESECTION     CRANIOTOMY N/A 12/10/2013   Procedure: Transpheniodal resection of Pituitary tumor with Dr. Radene Journey for approach;  Surgeon: Floyce Stakes, MD;  Location[facility];  Service: Neurosurgery;  Laterality: N/A;  Transpheniodal resection of Pituitary tumor with Dr. Radene Journey for approach   HERNIA REPAIR     INCISION AND DRAINAGE OF WOUND N/A 01/26/2014   Procedure: IRRIGATION AND DEBRIDEMENT ABDOMINAL WOUND WITH ;  Surgeon: [name], DO;  Location: [place];  Service: Plastics;  Laterality: N/A;   INCISION AND DRAINAGE OF WOUND N/A 02/02/2014   Procedure: IRRIGATION AND DEBRIDEMENT ABDOMINAL WOUND;  Surgeon: [name], DO;  Location: [facility] [place];  Service: Plastics;  Laterality: N/A;   INCISION AND DRAINAGE OF WOUND N/A 02/12/2014   Procedure: IRRIGATION AND DEBRIDEMENT OF ABDOMINAL WOUND/PLACEMENT OF A-CELL AND VAC;  Surgeon: [name], DO;  Location: [place];  Service: Plastics;  Laterality: N/A;   INCISION AND DRAINAGE OF WOUND N/A 04/01/2014   Procedure: IRRIGATION AND DEBRIDEMENT ABDOMINAL WOUND WITH PLACEMENT OF ACELL/VAC;  Surgeon: [name], DO;  Location: [facility];  Service: Plastics;  Laterality: N/A;   Sigmoid Colonoscopy     TRANSNASAL APPROACH N/A 12/10/2013   Procedure: TRANSNASAL APPROACH;  Surgeon: Rozetta Nunnery, MD;  Location: [facility] NEURO ORS;  Service: ENT;  Laterality: N/A;    Current Medications: Current Meds  Medication Sig   allopurinol (ZYLOPRIM) 300 MG tablet Take 300 mg by mouth daily.   aspirin 81 MG chewable tablet 1 tablet   buPROPion (WELLBUTRIN XL) 300 MG 24 hr tablet Take 300 mg by mouth daily.   carboxymethylcellulose (REFRESH PLUS) 0.5 % SOLN Place 2 drops into both eyes daily as needed (for dry eyes).   Diclofenac-miSOPROStol 75-0.2 MG TBEC Take 1 tablet by mouth 2 (two) times daily.    hydrocortisone (CORTEF) 20 MG tablet Take 40 mg by mouth daily.   pantoprazole (PROTONIX) 40 MG tablet 40 mg daily.    rosuvastatin (CRESTOR) 5 MG tablet 1 tablet   Semaglutide, 1 MG/DOSE, (OZEMPIC, 1 MG/DOSE,) 4 MG/3ML SOPN once a week.   sertraline (ZOLOFT) 100 MG tablet Take 200 mg by mouth daily.   traMADol (ULTRAM) 50 MG tablet Take 50 mg by mouth as needed for moderate pain (1 to 2 tabs as needed for pain).   zolpidem (AMBIEN) 10 MG tablet Take 1 tablet (10 mg total) by mouth at bedtime as needed for sleep.   [DISCONTINUED] levothyroxine (SYNTHROID, LEVOTHROID) 75 MCG tablet Take 75 mcg by mouth daily before breakfast.     Allergies:   Morphine, Penicillins, Buprenorphine hcl, and Morphine and related   Social History   Socioeconomic History   Marital status: Single    Spouse name: Not on file   Number of children: Not on file   Years of education: Not on file   Highest education level: Not on file  Occupational History  Not on file  Tobacco Use   Smoking status: Never   Smokeless tobacco: Never  Vaping Use   Vaping Use: Never used  Substance and Sexual Activity   Alcohol use: No   Drug use: No   Sexual activity: Never  Other Topics Concern   Not on file  Social History Narrative   Not on file   Social Determinants of Health   Financial Resource Strain: Not on file  Food Insecurity: Not on file  Transportation Needs: Not on file  Physical Activity: Not on file  Stress: Not on file  Social Connections: Not on file     Family History: The patient's family history includes Cancer in his maternal aunt and maternal grandmother; Gout in his brother and father; Hypertension in his father and mother; Hypothyroidism in his father.  ROS:   Please see the history of present illness.     All other systems reviewed and are negative.  EKGs/Labs/Other Studies Reviewed:    The following studies were reviewed today:  Echocardiogram 12/19/2013  - Left ventricle: The cavity size was normal. Wall thickness was    increased in a pattern of mild LVH. Systolic function was mildly     reduced. The estimated ejection fraction was in the range of 45%    to 50%. There is hypokinesis of the inferior myocardium.  - Mitral valve: There was mild regurgitation.  - Right ventricle: The cavity size was moderately dilated. Wall    thickness was normal. Systolic function was mildly reduced.     Coronary calcium score 2022  CORONARY CALCIUM SCORES:    Left Main: 0  LAD: 491  LCx: 25  RCA: 97   Total Agatston Score: 613 MESA database percentile: 96   AORTA MEASUREMENTS: Ascending Aorta: 38 mm Descending Aorta: 29 mm  IMPRESSION: 1. Coronary calcium score of 613 is at the 96th percentile for the patient's age, sex and race. 2. Small hiatal hernia.  Coronary CT angiography 07/20/2021 IMPRESSION: 1. Coronary calcium score of 574. This was 95th percentile for age and sex matched control.   2. Normal coronary origin with right dominance.   3. Mixed plaque in the proximal LAD causes moderate (50-69%) stenosis. There are high risk plaque features including positive remodeling and napkin ring sign.   4.  Will send study for CTFFR    CAD-RADS 3. Moderate stenosis. Consider symptom-guided anti-ischemic pharmacotherapy as well as risk factor modification per guideline directed care. Additional analysis with CT FFR will be submitted.   IMPRESSION: 1. Coronary calcium score of 574. This was 95th percentile for age and sex matched control.   2. Normal coronary origin with right dominance.   3. Mixed plaque in the proximal LAD causes moderate (50-69%) stenosis. There are high risk plaque features including positive remodeling and napkin ring sign.   4.  Will send study for CTFFR   CAD-RADS 3. Moderate stenosis. Consider symptom-guided anti-ischemic pharmacotherapy as well as risk factor modification per guideline directed care. Additional analysis with CT FFR will be submitted.  CTFFR 0.78 across lesion in proximal LAD, suggesting functional significance. Consider  cardiac catheterization for further evaluation        EKG:  EKG is not ordered today.  Tracing from 07/06/2021 personally reviewed.  It shows sinus rhythm with premature atrial contractions.  There is baseline artifact that mimics atrial fibrillation ordered today.  There is an incomplete right bundle branch block with a QRS duration of around 110 ms.  It is very  similar to the ECG from Dr. Pennie Banter office from 06/13/2021.  Recent Labs: No results found for requested labs within last 8760 hours.  07/11/2021 Creatinine 1.00, BUN 15, potassium 4.3 A.m. cortisol 2.1 Hemoglobin A1c 5.1%, TSH 0.02, free T4 normal 1.7  Recent Lipid Panel    Component Value Date/Time   TRIG 174 (H) 12/10/2013 2300  07/11/2021 cholesterol 120, HDL 35, LDL 64, triglycerides 102    Risk Assessment/Calculations:           Physical Exam:    VS:  BP (!) 154/89 (BP Location: Left Arm, Patient Position: Sitting, Cuff Size: Large)    Pulse 73    Ht 6' (1.829 m)    Wt (!) 398 lb (180.5 kg)    SpO2 96%    BMI 53.98 kg/m     Wt Readings from Last 3 Encounters:  07/25/21 (!) 398 lb (180.5 kg)  07/06/21 (!) 400 lb (181.4 kg)  07/30/18 (!) 410 lb (186 kg)     General: Alert, oriented x3, no distress, super obesity, which limits the exam.  He is in an electrical scooter Head: no evidence of trauma, PERRL, EOMI, no exophtalmos or lid lag, no myxedema, no xanthelasma; normal ears, nose and oropharynx Neck: normal jugular venous pulsations and no hepatojugular reflux; brisk carotid pulses without delay and no carotid bruits Chest: clear to auscultation, no signs of consolidation by percussion or palpation, normal fremitus, symmetrical and full respiratory excursions Cardiovascular: normal position and quality of the apical impulse, regular rhythm, normal first and second heart sounds, no murmurs, rubs or gallops Abdomen: no tenderness or distention, no masses by palpation, no abnormal pulsatility or arterial bruits,  normal bowel sounds, no hepatosplenomegaly Extremities: no clubbing, cyanosis or edema; 2+ radial, ulnar and brachial pulses bilaterally; 2+ right femoral, posterior tibial and dorsalis pedis pulses; 2+ left femoral, posterior tibial and dorsalis pedis pulses; no subclavian or femoral bruits Neurological: grossly nonfocal Psych: Normal mood and affect   ASSESSMENT:    1. Coronary artery disease of native artery of native heart with stable angina pectoris ([place])   2. Pre-procedure lab exam   3. Dyslipidemia (high LDL; low HDL)   4. Super-super obese ([place])   5. History of adrenal insufficiency   6. Acquired hypothyroidism    PLAN:    In order of problems listed above:  CAD: Coronary CT angiography and CT FFR show a proximal LAD significant stenosis.  He has probable exertional angina in a stable pattern (he describes as "gas" when he is hurrying).  Recommend coronary angiography.  Note that the coronary lesion we have identified does not correspond to the reported area of inferior wall motion abnormality from his previous echocardiogram.  Functional status is poor due to all his other physical limitations. The diagnostic coronary angiography procedure, as well as possible angioplasty-stent, has been fully reviewed with the patient and written informed consent has been obtained. HLP: after starting rosuvastatin 5 mg daily, his LDL is now 64, but his HDL is also low.   Superobesity: He is losing weight with Ozempic Adrenal insufficiency status post pituitary resection: On adrenal supplement Acquired hypothyroidism: On levothyroxine supplementation.  Shared Decision Making/Informed Consent The risks [stroke (1 in 1000), death (1 in 1000), kidney failure [usually temporary] (1 in 500), bleeding (1 in 200), allergic reaction [possibly serious] (1 in 200)], benefits (diagnostic support and management of coronary artery disease) and alternatives of a cardiac catheterization were discussed in detail  with [name] [name] and he is willing  to proceed.    Medication Adjustments/Labs and Tests Ordered: Current medicines are reviewed at length with the patient today.  Concerns regarding medicines are outlined above.  Orders Placed This Encounter  Procedures   CBC   No orders of the defined types were placed in this encounter.   Patient Instructions  Medication Instructions:  No changes *If you need a refill on your cardiac medications before your next appointment, please call your pharmacy*  Testing/Procedures: Your physician has requested that you have a cardiac catheterization. Cardiac catheterization is used to diagnose and/or treat various heart conditions. Doctors may recommend this procedure for a number of different reasons. The most common reason is to evaluate chest pain. Chest pain can be a symptom of coronary artery disease (CAD), and cardiac catheterization can show whether plaque is narrowing or blocking your hearts arteries. This procedure is also used to evaluate the valves, as well as measure the blood flow and oxygen levels in different parts of your heart. For further information please visit [email or web address]. Please follow instruction sheet, as given.   Follow-Up: At [facility], you and your health needs are our priority.  As part of our continuing mission to provide you with exceptional heart care, we have created designated Provider Care Teams.  These Care Teams include your primary Cardiologist (physician) and Advanced Practice Providers (APPs -  Physician Assistants and Nurse Practitioners) who all work together to provide you with the care you need, when you need it.  We recommend signing up for the patient portal called "MyChart".  Sign up information is provided on this After Visit Summary.  MyChart is used to connect with patients for Virtual Visits (Telemedicine).  Patients are able to view lab/test results, encounter notes, upcoming appointments, etc.   Non-urgent messages can be sent to your provider as well.   To learn more about what you can do with MyChart, go to [email or web address].    Your next appointment:  Keep your post procedure follow up on 3/22 at 4:30 with Dr. Sallyanne Kuster  Other Instructions  [place] [facility] CARDIOVASCULAR DIVISION [facility] [place] [address] Dept: [place]: [place]  07/25/2021  You are scheduled for a Cardiac Catheterization on Thursday, February 23 with Dr. Peter Martinique.  1. Please arrive at the [address] [facility] (Main Entrance A) at [facility]: [address] [place], [place] [address] at 8:30 AM (This time is two hours before your procedure to ensure your preparation). Free valet parking service is available.   Special note: Every effort is made to have your procedure done on time. Please understand that emergencies sometimes delay scheduled procedures.  2. Diet: Do not eat solid foods after midnight.  The patient may have clear liquids until 5am upon the day of the procedure.  3. Labs: You will need to have blood drawn on 2/20  4. Medication instructions in preparation for your procedure: Nothing to hold  On the morning of your procedure, take your Aspirin and any morning medicines NOT listed above.  You may use sips of water.  5. Plan for one night stay--bring personal belongings. 6. Bring a current list of your medications and current insurance cards. 7. You MUST have a responsible person to drive you home. 8. Someone MUST be with you the first 24 hours after you arrive home or your discharge will be delayed. 9. Please wear clothes that are easy to get on and off and wear  slip-on shoes.  Thank you for allowing [place] to care for you!   -- [place] [facility]     Signed, Sanda Klein, MD  07/25/2021 3:13 PM    [place]

## 2021-07-25 NOTE — Progress Notes (Signed)
[facility] Office Note:    Date:  07/25/2021   ID:  [name] [name], DOB 03-Aug-1965, MRN [id number]  PCP:  [name], MD   [place] Providers Cardiologist:  Sanda Klein, MD     Referring MD: [name], MD   Chief Complaint  Patient presents with   Coronary Artery Disease   History of Present Illness:    [name] [name] is a 56 y.o. 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  CT angiography confirmed to the related coronary calcium score and also showed evidence of significant stenosis in the proximal LAD artery with CT FFR of 0.78.  [name] occasionally describes symptoms of "gas" that he feels in his mid chest, that occur only when he is "rushing" and resolve promptly with rest.  They have never been severe and usually resolve within 1 or 2 minutes of resting.  [name] is limited to transition by sliding from his electric scooter to bed or commode.  He cannot walk.  About 20 years ago he was slim and used to be a runner but then developed steady weight gain, had to undergo multiple colon surgeries, complicated by a protracted course with abdominal wound infection requiring prolonged antibiotics.  He developed sequential bilateral Achilles tendon rupture, probably due to prolonged treatment with Cipro.  He was subsequently diagnosed with Cushing syndrome, which further limited his ability to be active due to proximal muscle weakness and atrophy.  Because of this, his ability to perform any type of physical exertion is severely limited.  He underwent a coronary calcium score that was elevated at 613, placing him in the 65 percentile for age and gender.  The bulk of the  coronary calcification was in the LAD artery.  Follow-up coronary CT angiography showed a significant stenosis in the proximal LAD artery with CT FFR of 0.78, moderate disease elsewhere.  He had normal ABIs on 06/15/2021.  An echocardiogram in November 2015 describes an EF of 45-50% with inferior wall hypokinesis.  He has normal renal function.  He does not have diabetes mellitus but is taking Ozempic for weight loss and has managed to lose 60 pounds since therapy initiation.  His lipid profile showed an LDL cholesterol of 84 and he has started on rosuvastatin 5 mg daily.  Labs on 07/11/2021 showed that his LDL is now down to 64, but HDL is also lower at 35.  He denies palpitations, dizziness, vertigo, syncope, lower extremity edema, orthopnea, PND or exertional dyspnea.  His paternal grandfather (smoker) died of a myocardial infarction at age 71 and he has 2 paternal uncles that also died of myocardial infarction, albeit at more advanced ages.  His grandmother also had coronary disease despite a very healthy lifestyle and lean body habitus.  Past Medical History:  Diagnosis Date   Allergic rhinitis    Anxiety    Arthritis    Depression    Diastolic CHF ([place])    Gout    H/O acute respiratory failure    Hypertension    Morbid obesity ([place])    Nephrolithiasis    Peripheral neuropathy    Pituitary tumor    PONV (postoperative nausea and vomiting)    Sleep apnea    uses bi-pap    Past Surgical History:  Procedure  Laterality Date   ABDOMINAL SURGERY  06/2013   for cyst with MRSA   ACHILLES TENDON REPAIR Bilateral    APPLICATION OF A-CELL OF CHEST/ABDOMEN N/A 01/26/2014   Procedure: PLACEMENT OF A CELL AND VAC ;  Surgeon: [name], DO;  Location: [facility];  Service: Plastics;  Laterality: N/A;   APPLICATION OF A-CELL OF CHEST/ABDOMEN N/A 02/02/2014   Procedure: WITH PLACEMENT OF A CELL AND VAC ;  Surgeon: [name], DO;  Location: [place];  Service: Plastics;  Laterality: N/A;    BRAIN SURGERY     COLON RESECTION     CRANIOTOMY N/A 12/10/2013   Procedure: Transpheniodal resection of Pituitary tumor with Dr. Radene Journey for approach;  Surgeon: Floyce Stakes, MD;  Location: [facility] NEURO ORS;  Service: Neurosurgery;  Laterality: N/A;  Transpheniodal resection of Pituitary tumor with Dr. Radene Journey for approach   HERNIA REPAIR     INCISION AND DRAINAGE OF WOUND N/A 01/26/2014   Procedure: IRRIGATION AND DEBRIDEMENT ABDOMINAL WOUND WITH ;  Surgeon: [name], DO;  Location: [place];  Service: Plastics;  Laterality: N/A;   INCISION AND DRAINAGE OF WOUND N/A 02/02/2014   Procedure: IRRIGATION AND DEBRIDEMENT ABDOMINAL WOUND;  Surgeon: [name], DO;  Location: [place];  Service: Plastics;  Laterality: N/A;   INCISION AND DRAINAGE OF WOUND N/A 02/12/2014   Procedure: IRRIGATION AND DEBRIDEMENT OF ABDOMINAL WOUND/PLACEMENT OF A-CELL AND VAC;  Surgeon: [name], DO;  Location: [place];  Service: Plastics;  Laterality: N/A;   INCISION AND DRAINAGE OF WOUND N/A 04/01/2014   Procedure: IRRIGATION AND DEBRIDEMENT ABDOMINAL WOUND WITH PLACEMENT OF ACELL/VAC;  Surgeon: [name], DO;  Location: [place];  Service: Plastics;  Laterality: N/A;   Sigmoid Colonoscopy     TRANSNASAL APPROACH N/A 12/10/2013   Procedure: TRANSNASAL APPROACH;  Surgeon: Rozetta Nunnery, MD;  Location: [facility] NEURO ORS;  Service: ENT;  Laterality: N/A;    Current Medications: Current Meds  Medication Sig   allopurinol (ZYLOPRIM) 300 MG tablet Take 300 mg by mouth daily.   aspirin 81 MG chewable tablet 1 tablet   buPROPion (WELLBUTRIN XL) 300 MG 24 hr tablet Take 300 mg by mouth daily.   carboxymethylcellulose (REFRESH PLUS) 0.5 % SOLN Place 2 drops into both eyes daily as needed (for dry eyes).   Diclofenac-miSOPROStol 75-0.2 MG TBEC Take 1 tablet by mouth 2 (two) times daily.    hydrocortisone (CORTEF) 20 MG tablet Take 40 mg by mouth daily.   pantoprazole (PROTONIX) 40 MG tablet 40 mg daily.    rosuvastatin (CRESTOR) 5 MG tablet 1 tablet   Semaglutide, 1 MG/DOSE, (OZEMPIC, 1 MG/DOSE,) 4 MG/3ML SOPN once a week.   sertraline (ZOLOFT) 100 MG tablet Take 200 mg by mouth daily.   traMADol (ULTRAM) 50 MG tablet Take 50 mg by mouth as needed for moderate pain (1 to 2 tabs as needed for pain).   zolpidem (AMBIEN) 10 MG tablet Take 1 tablet (10 mg total) by mouth at bedtime as needed for sleep.   [DISCONTINUED] levothyroxine (SYNTHROID, LEVOTHROID) 75 MCG tablet Take 75 mcg by mouth daily before breakfast.     Allergies:   Morphine, Penicillins, Buprenorphine hcl, and Morphine and related   Social History   Socioeconomic History   Marital status: Single    Spouse name: Not on file   Number of children: Not on file   Years of education: Not on file   Highest education level: Not on file  Occupational History  Not on file  Tobacco Use   Smoking status: Never   Smokeless tobacco: Never  Vaping Use   Vaping Use: Never used  Substance and Sexual Activity   Alcohol use: No   Drug use: No   Sexual activity: Never  Other Topics Concern   Not on file  Social History Narrative   Not on file   Social Determinants of Health   Financial Resource Strain: Not on file  Food Insecurity: Not on file  Transportation Needs: Not on file  Physical Activity: Not on file  Stress: Not on file  Social Connections: Not on file     Family History: The patient's family history includes Cancer in his maternal aunt and maternal grandmother; Gout in his brother and father; Hypertension in his father and mother; Hypothyroidism in his father.  ROS:   Please see the history of present illness.     All other systems reviewed and are negative.  EKGs/Labs/Other Studies Reviewed:    The following studies were reviewed today:  Echocardiogram 12/19/2013  - Left ventricle: The cavity size was normal. Wall thickness was    increased in a pattern of mild LVH. Systolic function was mildly     reduced. The estimated ejection fraction was in the range of 45%    to 50%. There is hypokinesis of the inferior myocardium.  - Mitral valve: There was mild regurgitation.  - Right ventricle: The cavity size was moderately dilated. Wall    thickness was normal. Systolic function was mildly reduced.     Coronary calcium score 2022  CORONARY CALCIUM SCORES:    Left Main: 0  LAD: 491  LCx: 25  RCA: 97   Total Agatston Score: 613 MESA database percentile: 96   AORTA MEASUREMENTS: Ascending Aorta: 38 mm Descending Aorta: 29 mm  IMPRESSION: 1. Coronary calcium score of 613 is at the 96th percentile for the patient's age, sex and race. 2. Small hiatal hernia.  Coronary CT angiography 07/20/2021 IMPRESSION: 1. Coronary calcium score of 574. This was 95th percentile for age and sex matched control.   2. Normal coronary origin with right dominance.   3. Mixed plaque in the proximal LAD causes moderate (50-69%) stenosis. There are high risk plaque features including positive remodeling and napkin ring sign.   4.  Will send study for CTFFR    CAD-RADS 3. Moderate stenosis. Consider symptom-guided anti-ischemic pharmacotherapy as well as risk factor modification per guideline directed care. Additional analysis with CT FFR will be submitted.   IMPRESSION: 1. Coronary calcium score of 574. This was 95th percentile for age and sex matched control.   2. Normal coronary origin with right dominance.   3. Mixed plaque in the proximal LAD causes moderate (50-69%) stenosis. There are high risk plaque features including positive remodeling and napkin ring sign.   4.  Will send study for CTFFR   CAD-RADS 3. Moderate stenosis. Consider symptom-guided anti-ischemic pharmacotherapy as well as risk factor modification per guideline directed care. Additional analysis with CT FFR will be submitted.  CTFFR 0.78 across lesion in proximal LAD, suggesting functional significance. Consider  cardiac catheterization for further evaluation        EKG:  EKG is not ordered today.  Tracing from 07/06/2021 personally reviewed.  It shows sinus rhythm with premature atrial contractions.  There is baseline artifact that mimics atrial fibrillation ordered today.  There is an incomplete right bundle branch block with a QRS duration of around 110 ms.  It is very  similar to the ECG from Dr. Pennie Banter office from 06/13/2021.  Recent Labs: No results found for requested labs within last 8760 hours.  07/11/2021 Creatinine 1.00, BUN 15, potassium 4.3 A.m. cortisol 2.1 Hemoglobin A1c 5.1%, TSH 0.02, free T4 normal 1.7  Recent Lipid Panel    Component Value Date/Time   TRIG 174 (H) 12/10/2013 2300  07/11/2021 cholesterol 120, HDL 35, LDL 64, triglycerides 102    Risk Assessment/Calculations:           Physical Exam:    VS:  BP (!) 154/89 (BP Location: Left Arm, Patient Position: Sitting, Cuff Size: Large)    Pulse 73    Ht 6' (1.829 m)    Wt (!) 398 lb (180.5 kg)    SpO2 96%    BMI 53.98 kg/m     Wt Readings from Last 3 Encounters:  07/25/21 (!) 398 lb (180.5 kg)  07/06/21 (!) 400 lb (181.4 kg)  07/30/18 (!) 410 lb (186 kg)     General: Alert, oriented x3, no distress, super obesity, which limits the exam.  He is in an electrical scooter Head: no evidence of trauma, PERRL, EOMI, no exophtalmos or lid lag, no myxedema, no xanthelasma; normal ears, nose and oropharynx Neck: normal jugular venous pulsations and no hepatojugular reflux; brisk carotid pulses without delay and no carotid bruits Chest: clear to auscultation, no signs of consolidation by percussion or palpation, normal fremitus, symmetrical and full respiratory excursions Cardiovascular: normal position and quality of the apical impulse, regular rhythm, normal first and second heart sounds, no murmurs, rubs or gallops Abdomen: no tenderness or distention, no masses by palpation, no abnormal pulsatility or arterial bruits,  normal bowel sounds, no hepatosplenomegaly Extremities: no clubbing, cyanosis or edema; 2+ radial, ulnar and brachial pulses bilaterally; 2+ right femoral, posterior tibial and dorsalis pedis pulses; 2+ left femoral, posterior tibial and dorsalis pedis pulses; no subclavian or femoral bruits Neurological: grossly nonfocal Psych: Normal mood and affect   ASSESSMENT:    1. Coronary artery disease of native artery of native heart with stable angina pectoris ([place])   2. Pre-procedure lab exam   3. Dyslipidemia (high LDL; low HDL)   4. Super-super obese ([place])   5. History of adrenal insufficiency   6. Acquired hypothyroidism    PLAN:    In order of problems listed above:  CAD: Coronary CT angiography and CT FFR show a proximal LAD significant stenosis.  He has probable exertional angina in a stable pattern (he describes as "gas" when he is hurrying).  Recommend coronary angiography.  Note that the coronary lesion we have identified does not correspond to the reported area of inferior wall motion abnormality from his previous echocardiogram.  Functional status is poor due to all his other physical limitations. The diagnostic coronary angiography procedure, as well as possible angioplasty-stent, has been fully reviewed with the patient and written informed consent has been obtained. HLP: after starting rosuvastatin 5 mg daily, his LDL is now 64, but his HDL is also low.   Superobesity: He is losing weight with Ozempic Adrenal insufficiency status post pituitary resection: On adrenal supplement Acquired hypothyroidism: On levothyroxine supplementation.  Shared Decision Making/Informed Consent The risks [stroke (1 in 1000), death (1 in 1000), kidney failure [usually temporary] (1 in 500), bleeding (1 in 200), allergic reaction [possibly serious] (1 in 200)], benefits (diagnostic support and management of coronary artery disease) and alternatives of a cardiac catheterization were discussed in detail  with Mr. Stankey and he is willing  to proceed.    Medication Adjustments/Labs and Tests Ordered: Current medicines are reviewed at length with the patient today.  Concerns regarding medicines are outlined above.  Orders Placed This Encounter  Procedures   CBC   No orders of the defined types were placed in this encounter.   Patient Instructions  Medication Instructions:  No changes *If you need a refill on your cardiac medications before your next appointment, please call your pharmacy*  Testing/Procedures: Your physician has requested that you have a cardiac catheterization. Cardiac catheterization is used to diagnose and/or treat various heart conditions. Doctors may recommend this procedure for a number of different reasons. The most common reason is to evaluate chest pain. Chest pain can be a symptom of coronary artery disease (CAD), and cardiac catheterization can show whether plaque is narrowing or blocking your hearts arteries. This procedure is also used to evaluate the valves, as well as measure the blood flow and oxygen levels in different parts of your heart. For further information please visit [email or web address].[email or web address]. Please follow instruction sheet, as given.   Follow-Up: At [facility], you and your health needs are our priority.  As part of our continuing mission to provide you with exceptional heart care, we have created designated Provider Care Teams.  These Care Teams include your primary Cardiologist (physician) and Advanced Practice Providers (APPs -  Physician Assistants and Nurse Practitioners) who all work together to provide you with the care you need, when you need it.  We recommend signing up for the patient portal called "MyChart".  Sign up information is provided on this After Visit Summary.  MyChart is used to connect with patients for Virtual Visits (Telemedicine).  Patients are able to view lab/test results, encounter notes, upcoming appointments, etc.   Non-urgent messages can be sent to your provider as well.   To learn more about what you can do with MyChart, go to [email or web address].    Your next appointment:  Keep your post procedure follow up on 3/22 at 4:30 with Dr. Sallyanne Kuster  Other Instructions  [facility] CARDIOVASCULAR DIVISION [facility] [place] [address] Dept: [place]: [place]  07/25/2021  You are scheduled for a Cardiac Catheterization on Thursday, February 23 with Dr. Peter Martinique.  1. Please arrive at the [facility] (Main Entrance A) at [facility]: [address] [place], [place] [address] at 8:30 AM (This time is two hours before your procedure to ensure your preparation). Free valet parking service is available.   Special note: Every effort is made to have your procedure done on time. Please understand that emergencies sometimes delay scheduled procedures.  2. Diet: Do not eat solid foods after midnight.  The patient may have clear liquids until 5am upon the day of the procedure.  3. Labs: You will need to have blood drawn on 2/20  4. Medication instructions in preparation for your procedure: Nothing to hold  On the morning of your procedure, take your Aspirin and any morning medicines NOT listed above.  You may use sips of water.  5. Plan for one night stay--bring personal belongings. 6. Bring a current list of your medications and current insurance cards. 7. You MUST have a responsible person to drive you home. 8. Someone MUST be with you the first 24 hours after you arrive home or your discharge will be delayed. 9. Please wear clothes that are easy to get on and off and wear  slip-on shoes.  Thank you for allowing [place] to care for you!   -- [facility]     Signed, Sanda Klein, MD  07/25/2021 3:13 PM    [place]

## 2021-07-26 LAB — CBC
Hematocrit: 40.7 % (ref 37.5–51.0)
Hemoglobin: 14 g/dL (ref 13.0–17.7)
MCH: 30.3 pg (ref 26.6–33.0)
MCHC: 34.4 g/dL (ref 31.5–35.7)
MCV: 88 fL (ref 79–97)
Platelets: 128 10*3/uL — ABNORMAL LOW (ref 150–450)
RBC: 4.62 x10E6/uL (ref 4.14–5.80)
RDW: 13.4 % (ref 11.6–15.4)
WBC: 5.2 10*3/uL (ref 3.4–10.8)

## 2021-07-27 ENCOUNTER — Telehealth: Payer: Self-pay | Admitting: *Deleted

## 2021-07-27 NOTE — Telephone Encounter (Signed)
Copied from 07/26/21 My Chart message from patient to Dr Croitoru/Dr Martinique: "  I had a brain tumor and Cushings Disease so now we manage with Hydro cortisone. I checked with my Endocrinologist Dr. Chalmers Cater at [facility] to let her know about the cardiac cath.  She instructed me to double my dose of hydrocortisone in the AM and request Dr.Jordan to give me an injection of hydrocortisone prior to the procedure. "  Copied from 07/26/21 My Chart message response from Dr Martinique to patient: "Thanks for the heads up. We will plan on giving you a dose of IV hydrocortisone prior to your procedure   Peter Martinique MD, [facility]"   Copied from 07/27/21 Staff Message from Dr Martinique to me: "You can order 100 mg of hydrocortisone IV. Thanks   Peter Martinique MD, [facility]  ______________________  Per Dr Doren Custard has been placed for 100 mg hydrocortisone IV prior to Cardiac Cath 07/28/21.

## 2021-07-27 NOTE — Telephone Encounter (Addendum)
Cardiac catheterization scheduled at [facility] for: Thursday July 28, 2021 10:30 [facility] Main Entrance A [facility]) at: 8:00 AM-cath lab request pt arrive 8 AM   Diet-no solid food after midnight prior to cath, clear liquids until 5 AM day of procedure.  Medication instructions for procedure: -Usual morning medications can be taken pre-cath with sips of water including aspirin 81 mg.    Must have responsible adult to drive home post procedure and be with patient first 24 hours after arriving home.  [facility] does allow one visitor to wait in the waiting room during the time you are there.   Patient reports does not currently have any new symptoms concerning for COVID-19 and no household members with COVID-19 like illness.    Reviewed procedure instructions with patient.

## 2021-07-28 ENCOUNTER — Encounter (HOSPITAL_COMMUNITY): Payer: Self-pay | Admitting: Cardiology

## 2021-07-28 ENCOUNTER — Observation Stay (HOSPITAL_COMMUNITY)
Admission: RE | Admit: 2021-07-28 | Discharge: 2021-07-29 | Disposition: A | Discharge status: Home / Self Care | Payer: Medicare HMO | Source: Ambulatory Visit | Attending: Cardiology | Admitting: Cardiology

## 2021-07-28 ENCOUNTER — Ambulatory Visit (HOSPITAL_COMMUNITY)
Admission: RE | Disposition: A | Discharge status: Home / Self Care | Payer: Self-pay | Source: Ambulatory Visit | Attending: Cardiology

## 2021-07-28 ENCOUNTER — Other Ambulatory Visit: Payer: Self-pay

## 2021-07-28 DIAGNOSIS — E039 Hypothyroidism, unspecified: Secondary | ICD-10-CM | POA: Diagnosis not present

## 2021-07-28 DIAGNOSIS — I209 Angina pectoris, unspecified: Secondary | ICD-10-CM | POA: Diagnosis present

## 2021-07-28 DIAGNOSIS — Z880 Allergy status to penicillin: Secondary | ICD-10-CM | POA: Diagnosis not present

## 2021-07-28 DIAGNOSIS — Z7982 Long term (current) use of aspirin: Secondary | ICD-10-CM | POA: Insufficient documentation

## 2021-07-28 DIAGNOSIS — E785 Hyperlipidemia, unspecified: Secondary | ICD-10-CM | POA: Diagnosis not present

## 2021-07-28 DIAGNOSIS — I25119 Atherosclerotic heart disease of native coronary artery with unspecified angina pectoris: Secondary | ICD-10-CM | POA: Diagnosis not present

## 2021-07-28 DIAGNOSIS — Z885 Allergy status to narcotic agent status: Secondary | ICD-10-CM | POA: Insufficient documentation

## 2021-07-28 DIAGNOSIS — I2510 Atherosclerotic heart disease of native coronary artery without angina pectoris: Secondary | ICD-10-CM | POA: Diagnosis present

## 2021-07-28 DIAGNOSIS — G4733 Obstructive sleep apnea (adult) (pediatric): Secondary | ICD-10-CM | POA: Diagnosis not present

## 2021-07-28 DIAGNOSIS — Z955 Presence of coronary angioplasty implant and graft: Secondary | ICD-10-CM

## 2021-07-28 DIAGNOSIS — I110 Hypertensive heart disease with heart failure: Secondary | ICD-10-CM | POA: Insufficient documentation

## 2021-07-28 DIAGNOSIS — I25110 Atherosclerotic heart disease of native coronary artery with unstable angina pectoris: Secondary | ICD-10-CM

## 2021-07-28 DIAGNOSIS — I5032 Chronic diastolic (congestive) heart failure: Secondary | ICD-10-CM | POA: Insufficient documentation

## 2021-07-28 DIAGNOSIS — Z79899 Other long term (current) drug therapy: Secondary | ICD-10-CM | POA: Insufficient documentation

## 2021-07-28 DIAGNOSIS — Z6841 Body Mass Index (BMI) 40.0 and over, adult: Secondary | ICD-10-CM | POA: Diagnosis not present

## 2021-07-28 HISTORY — PX: CORONARY STENT INTERVENTION: CATH118234

## 2021-07-28 HISTORY — PX: LEFT HEART CATH AND CORONARY ANGIOGRAPHY: CATH118249

## 2021-07-28 LAB — BASIC METABOLIC PANEL
Anion gap: 10 (ref 5–15)
BUN: 17 mg/dL (ref 6–20)
CO2: 22 mmol/L (ref 22–32)
Calcium: 9.2 mg/dL (ref 8.9–10.3)
Chloride: 106 mmol/L (ref 98–111)
Creatinine, Ser: 1.22 mg/dL (ref 0.61–1.24)
GFR, Estimated: >60 mL/min (ref >60)
Glucose, Bld: 103 mg/dL — ABNORMAL HIGH (ref 70–99)
Potassium: 4.3 mmol/L (ref 3.5–5.1)
Sodium: 138 mmol/L (ref 135–145)

## 2021-07-28 LAB — POCT ACTIVATED CLOTTING TIME: Activated Clotting Time: 366 seconds

## 2021-07-28 SURGERY — LEFT HEART CATH AND CORONARY ANGIOGRAPHY
Anesthesia: LOCAL

## 2021-07-28 MED ORDER — MIDAZOLAM HCL 2 MG/2ML IJ SOLN
INTRAMUSCULAR | Status: AC
  Filled 2021-07-28: qty 2

## 2021-07-28 MED ORDER — LIDOCAINE HCL (PF) 1 % IJ SOLN
INTRAMUSCULAR | Status: AC
  Filled 2021-07-28: qty 30

## 2021-07-28 MED ORDER — FENTANYL CITRATE (PF) 100 MCG/2ML IJ SOLN
INTRAMUSCULAR | Status: DC | PRN
  Administered 2021-07-28 (×3): 25 ug via INTRAVENOUS

## 2021-07-28 MED ORDER — SODIUM CHLORIDE 0.9% FLUSH
3.0000 mL | Freq: Two times a day (BID) | INTRAVENOUS | Status: DC
  Administered 2021-07-28 – 2021-07-29 (×2): 3 mL via INTRAVENOUS

## 2021-07-28 MED ORDER — HYDRALAZINE HCL 20 MG/ML IJ SOLN: 10.0000 mg | INTRAMUSCULAR | Status: AC | PRN

## 2021-07-28 MED ORDER — ONDANSETRON HCL 4 MG/2ML IJ SOLN
INTRAMUSCULAR | Status: AC
  Filled 2021-07-28: qty 2

## 2021-07-28 MED ORDER — HEPARIN SODIUM (PORCINE) 1000 UNIT/ML IJ SOLN
INTRAMUSCULAR | Status: AC
  Filled 2021-07-28: qty 10

## 2021-07-28 MED ORDER — CLOPIDOGREL BISULFATE 300 MG PO TABS
ORAL_TABLET | ORAL | Status: AC
  Filled 2021-07-28: qty 2

## 2021-07-28 MED ORDER — ACETAMINOPHEN 325 MG PO TABS
ORAL_TABLET | ORAL | Status: AC
  Filled 2021-07-28: qty 2

## 2021-07-28 MED ORDER — SODIUM CHLORIDE 0.9 % IV SOLN: 250.0000 mL | INTRAVENOUS | Status: DC | PRN

## 2021-07-28 MED ORDER — CLOPIDOGREL BISULFATE 75 MG PO TABS
75.0000 mg | ORAL_TABLET | Freq: Every day | ORAL | Status: DC
  Administered 2021-07-29: 75 mg via ORAL
  Filled 2021-07-28: qty 1

## 2021-07-28 MED ORDER — ASPIRIN 81 MG PO CHEW
81.0000 mg | CHEWABLE_TABLET | Freq: Every day | ORAL | Status: DC
Start: 2021-07-29 — End: 2021-07-29
  Administered 2021-07-29: 81 mg via ORAL
  Filled 2021-07-28: qty 1

## 2021-07-28 MED ORDER — VERAPAMIL HCL 2.5 MG/ML IV SOLN
INTRAVENOUS | Status: DC | PRN
  Administered 2021-07-28: 10 mL via INTRA_ARTERIAL

## 2021-07-28 MED ORDER — PANTOPRAZOLE SODIUM 40 MG PO TBEC
40.0000 mg | DELAYED_RELEASE_TABLET | Freq: Every day | ORAL | Status: DC
  Administered 2021-07-28 – 2021-07-29 (×2): 40 mg via ORAL
  Filled 2021-07-28 (×2): qty 1

## 2021-07-28 MED ORDER — ONDANSETRON HCL 4 MG/2ML IJ SOLN
INTRAMUSCULAR | Status: DC | PRN
  Administered 2021-07-28: 4 mg via INTRAVENOUS

## 2021-07-28 MED ORDER — SODIUM CHLORIDE 0.9% FLUSH
3.0000 mL | Freq: Two times a day (BID) | INTRAVENOUS | Status: DC
  Administered 2021-07-28: 3 mL via INTRAVENOUS

## 2021-07-28 MED ORDER — HEPARIN (PORCINE) IN NACL 1000-0.9 UT/500ML-% IV SOLN
INTRAVENOUS | Status: AC
  Filled 2021-07-28: qty 500

## 2021-07-28 MED ORDER — NITROGLYCERIN IN D5W 200-5 MCG/ML-% IV SOLN
INTRAVENOUS | Status: AC
  Filled 2021-07-28: qty 250

## 2021-07-28 MED ORDER — ONDANSETRON HCL 4 MG/2ML IJ SOLN: 4.0000 mg | Freq: Four times a day (QID) | INTRAMUSCULAR | Status: DC | PRN

## 2021-07-28 MED ORDER — MIDAZOLAM HCL 2 MG/2ML IJ SOLN
INTRAMUSCULAR | Status: DC | PRN
  Administered 2021-07-28 (×2): 1 mg via INTRAVENOUS
  Administered 2021-07-28: 2 mg via INTRAVENOUS

## 2021-07-28 MED ORDER — LIDOCAINE HCL (PF) 1 % IJ SOLN
INTRAMUSCULAR | Status: DC | PRN
  Administered 2021-07-28: 2 mL

## 2021-07-28 MED ORDER — VERAPAMIL HCL 2.5 MG/ML IV SOLN
INTRAVENOUS | Status: AC
Start: 2021-07-28 — End: ?
  Filled 2021-07-28: qty 2

## 2021-07-28 MED ORDER — ROPINIROLE HCL 0.5 MG PO TABS: 0.5000 mg | ORAL_TABLET | Freq: Every evening | ORAL | Status: DC | PRN

## 2021-07-28 MED ORDER — SODIUM CHLORIDE 0.9% FLUSH: 3.0000 mL | INTRAVENOUS | Status: DC | PRN

## 2021-07-28 MED ORDER — ALPRAZOLAM 0.25 MG PO TABS
0.2500 mg | ORAL_TABLET | Freq: Three times a day (TID) | ORAL | Status: DC | PRN
  Administered 2021-07-28: 0.25 mg via ORAL

## 2021-07-28 MED ORDER — CYCLOBENZAPRINE HCL 10 MG PO TABS: 10.0000 mg | ORAL_TABLET | Freq: Three times a day (TID) | ORAL | Status: DC | PRN

## 2021-07-28 MED ORDER — SODIUM CHLORIDE 0.9 % IV SOLN: INTRAVENOUS | Status: AC

## 2021-07-28 MED ORDER — DICLOFENAC SODIUM 75 MG PO TBEC
75.0000 mg | DELAYED_RELEASE_TABLET | Freq: Two times a day (BID) | ORAL | Status: DC
  Administered 2021-07-28 – 2021-07-29 (×2): 75 mg via ORAL
  Filled 2021-07-28 (×2): qty 1

## 2021-07-28 MED ORDER — HYDROCORTISONE SOD SUC (PF) 100 MG IJ SOLR
100.0000 mg | INTRAMUSCULAR | Status: AC
  Administered 2021-07-28: 100 mg via INTRAVENOUS
  Filled 2021-07-28: qty 2

## 2021-07-28 MED ORDER — DICLOFENAC-MISOPROSTOL 75-0.2 MG PO TBEC: 1.0000 | DELAYED_RELEASE_TABLET | Freq: Two times a day (BID) | ORAL | Status: DC

## 2021-07-28 MED ORDER — ACETAMINOPHEN 325 MG PO TABS
650.0000 mg | ORAL_TABLET | ORAL | Status: DC | PRN
  Administered 2021-07-28 (×2): 650 mg via ORAL
  Filled 2021-07-28: qty 2

## 2021-07-28 MED ORDER — CLOPIDOGREL BISULFATE 300 MG PO TABS
ORAL_TABLET | ORAL | Status: DC | PRN
  Administered 2021-07-28: 600 mg via ORAL

## 2021-07-28 MED ORDER — HEPARIN (PORCINE) IN NACL 1000-0.9 UT/500ML-% IV SOLN
INTRAVENOUS | Status: DC | PRN
  Administered 2021-07-28 (×2): 500 mL

## 2021-07-28 MED ORDER — NITROGLYCERIN IN D5W 200-5 MCG/ML-% IV SOLN: 0.0000 ug/min | INTRAVENOUS | Status: DC

## 2021-07-28 MED ORDER — ALLOPURINOL 300 MG PO TABS
300.0000 mg | ORAL_TABLET | Freq: Every day | ORAL | Status: DC
  Administered 2021-07-28 – 2021-07-29 (×2): 300 mg via ORAL
  Filled 2021-07-28 (×2): qty 1

## 2021-07-28 MED ORDER — SODIUM CHLORIDE 0.9 % IV SOLN: INTRAVENOUS | Status: DC

## 2021-07-28 MED ORDER — NITROGLYCERIN 1 MG/10 ML FOR IR/CATH LAB
INTRA_ARTERIAL | Status: DC | PRN
  Administered 2021-07-28: 200 ug via INTRACORONARY

## 2021-07-28 MED ORDER — POLYVINYL ALCOHOL 1.4 % OP SOLN: 2.0000 [drp] | Freq: Every day | OPHTHALMIC | Status: DC | PRN

## 2021-07-28 MED ORDER — FAMOTIDINE IN NACL 20-0.9 MG/50ML-% IV SOLN
INTRAVENOUS | Status: AC | PRN
  Administered 2021-07-28: 20 mg via INTRAVENOUS

## 2021-07-28 MED ORDER — ALPRAZOLAM 0.25 MG PO TABS
ORAL_TABLET | ORAL | Status: AC
  Filled 2021-07-28: qty 1

## 2021-07-28 MED ORDER — BUPROPION HCL ER (XL) 150 MG PO TB24
300.0000 mg | ORAL_TABLET | Freq: Every day | ORAL | Status: DC
Start: 2021-07-29 — End: 2021-07-29
  Administered 2021-07-29: 300 mg via ORAL
  Filled 2021-07-28: qty 2

## 2021-07-28 MED ORDER — IOHEXOL 350 MG/ML SOLN
INTRAVENOUS | Status: DC | PRN
  Administered 2021-07-28: 150 mL

## 2021-07-28 MED ORDER — SEMAGLUTIDE (1 MG/DOSE) 4 MG/3ML ~~LOC~~ SOPN: 1.0000 mg | PEN_INJECTOR | SUBCUTANEOUS | Status: DC

## 2021-07-28 MED ORDER — SERTRALINE HCL 100 MG PO TABS
150.0000 mg | ORAL_TABLET | Freq: Every day | ORAL | Status: DC
  Administered 2021-07-29: 150 mg via ORAL
  Filled 2021-07-28: qty 1

## 2021-07-28 MED ORDER — FENTANYL CITRATE (PF) 100 MCG/2ML IJ SOLN
INTRAMUSCULAR | Status: AC
  Filled 2021-07-28: qty 2

## 2021-07-28 MED ORDER — MODAFINIL 100 MG PO TABS: 100.0000 mg | ORAL_TABLET | Freq: Every day | ORAL | Status: DC | PRN

## 2021-07-28 MED ORDER — HEPARIN SODIUM (PORCINE) 1000 UNIT/ML IJ SOLN
INTRAMUSCULAR | Status: DC | PRN
  Administered 2021-07-28 (×2): 6000 [IU] via INTRAVENOUS

## 2021-07-28 MED ORDER — ASPIRIN 81 MG PO CHEW: 81.0000 mg | CHEWABLE_TABLET | ORAL | Status: DC

## 2021-07-28 MED ORDER — NITROGLYCERIN 1 MG/10 ML FOR IR/CATH LAB
INTRA_ARTERIAL | Status: AC
  Filled 2021-07-28: qty 10

## 2021-07-28 MED ORDER — HYDROCORTISONE 20 MG PO TABS
20.0000 mg | ORAL_TABLET | Freq: Every day | ORAL | Status: DC
  Administered 2021-07-29: 20 mg via ORAL
  Filled 2021-07-28: qty 1

## 2021-07-28 MED ORDER — LEVOTHYROXINE SODIUM 25 MCG PO TABS
125.0000 ug | ORAL_TABLET | Freq: Every day | ORAL | Status: DC
  Administered 2021-07-29: 125 ug via ORAL
  Filled 2021-07-28: qty 1

## 2021-07-28 MED ORDER — TRAMADOL HCL 50 MG PO TABS
50.0000 mg | ORAL_TABLET | Freq: Every evening | ORAL | Status: DC | PRN
  Administered 2021-07-28: 100 mg via ORAL
  Filled 2021-07-28: qty 2

## 2021-07-28 MED ORDER — ZOLPIDEM TARTRATE 5 MG PO TABS
10.0000 mg | ORAL_TABLET | Freq: Every evening | ORAL | Status: DC | PRN
  Administered 2021-07-28: 10 mg via ORAL
  Filled 2021-07-28: qty 2

## 2021-07-28 MED ORDER — FAMOTIDINE IN NACL 20-0.9 MG/50ML-% IV SOLN
20.0000 mg | Freq: Once | INTRAVENOUS | Status: DC
  Filled 2021-07-28: qty 50

## 2021-07-28 MED ORDER — ROSUVASTATIN CALCIUM 5 MG PO TABS
5.0000 mg | ORAL_TABLET | Freq: Every day | ORAL | Status: DC
  Administered 2021-07-29: 5 mg via ORAL
  Filled 2021-07-28: qty 1

## 2021-07-28 SURGICAL SUPPLY — 22 items
BALLN  ~~LOC~~ SAPPHIRE 4.5X15 (BALLOONS) ×1
BALLN SAPPHIRE 2.5X12 (BALLOONS) ×2
BALLN ~~LOC~~ SAPPHIRE 4.5X15 (BALLOONS) ×1
BALLOON SAPPHIRE 2.5X12 (BALLOONS) IMPLANT
BALLOON ~~LOC~~ SAPPHIRE 4.5X15 (BALLOONS) IMPLANT
CATH 5FR JL3.5 JR4 ANG PIG MP (CATHETERS) ×1 IMPLANT
CATH VISTA GUIDE 6FR XBLAD3.5 (CATHETERS) ×1 IMPLANT
DEVICE RAD COMP TR BAND LRG (VASCULAR PRODUCTS) ×1 IMPLANT
ELECT DEFIB PAD ADLT CADENCE (PAD) ×1 IMPLANT
GLIDESHEATH SLEND SS 6F .021 (SHEATH) ×1 IMPLANT
GUIDEWIRE INQWIRE 1.5J.035X260 (WIRE) IMPLANT
INQWIRE 1.5J .035X260CM (WIRE) ×2
KIT ENCORE 26 ADVANTAGE (KITS) ×1 IMPLANT
KIT HEART LEFT (KITS) ×2 IMPLANT
PACK CARDIAC CATHETERIZATION (CUSTOM PROCEDURE TRAY) ×2 IMPLANT
SHEATH PROBE COVER 6X72 (BAG) ×1 IMPLANT
STENT ONYX FRONTIER 4.0X18 (Permanent Stent) ×1 IMPLANT
TRANSDUCER W/STOPCOCK (MISCELLANEOUS) ×2 IMPLANT
TUBING CIL FLEX 10 FLL-RA (TUBING) ×2 IMPLANT
WIRE ASAHI PROWATER 180CM (WIRE) ×1 IMPLANT
WIRE HI TORQ VERSACORE-J 145CM (WIRE) ×1 IMPLANT
WIRE MICROINTRODUCER 60CM (WIRE) ×1 IMPLANT

## 2021-07-28 NOTE — Interval H&P Note (Signed)
History and Physical Interval Note:  07/28/2021 10:27 AM  [name] [name]  has presented today for surgery, with the diagnosis of abnormal CT.  The various methods of treatment have been discussed with the patient and family. After consideration of risks, benefits and other options for treatment, the patient has consented to  Procedure(s): LEFT HEART CATH AND CORONARY ANGIOGRAPHY (N/A) as a surgical intervention.  The patient's history has been reviewed, patient examined, no change in status, stable for surgery.  I have reviewed the patient's chart and labs.  Questions were answered to the patient's satisfaction.   Cath Lab Visit (complete for each Cath Lab visit)  Clinical Evaluation Leading to the Procedure:   ACS: No.  Non-ACS:    Anginal Classification: CCS II  Anti-ischemic medical therapy: No Therapy  Non-Invasive Test Results: Intermediate-risk stress test findings: cardiac mortality 1-3%/year  Prior CABG: No previous CABG        [name] 07/28/2021 10:27 AM

## 2021-07-28 NOTE — Discharge Instructions (Signed)

## 2021-07-29 ENCOUNTER — Other Ambulatory Visit (HOSPITAL_COMMUNITY): Payer: Self-pay

## 2021-07-29 ENCOUNTER — Encounter (HOSPITAL_COMMUNITY): Payer: Self-pay | Admitting: Cardiology

## 2021-07-29 DIAGNOSIS — Z7982 Long term (current) use of aspirin: Secondary | ICD-10-CM | POA: Diagnosis not present

## 2021-07-29 DIAGNOSIS — Z79899 Other long term (current) drug therapy: Secondary | ICD-10-CM | POA: Diagnosis not present

## 2021-07-29 DIAGNOSIS — E039 Hypothyroidism, unspecified: Secondary | ICD-10-CM | POA: Diagnosis not present

## 2021-07-29 DIAGNOSIS — I209 Angina pectoris, unspecified: Secondary | ICD-10-CM | POA: Diagnosis not present

## 2021-07-29 DIAGNOSIS — E785 Hyperlipidemia, unspecified: Secondary | ICD-10-CM | POA: Diagnosis not present

## 2021-07-29 DIAGNOSIS — I5032 Chronic diastolic (congestive) heart failure: Secondary | ICD-10-CM | POA: Diagnosis not present

## 2021-07-29 DIAGNOSIS — I25119 Atherosclerotic heart disease of native coronary artery with unspecified angina pectoris: Secondary | ICD-10-CM | POA: Diagnosis not present

## 2021-07-29 DIAGNOSIS — I110 Hypertensive heart disease with heart failure: Secondary | ICD-10-CM | POA: Diagnosis not present

## 2021-07-29 DIAGNOSIS — G4733 Obstructive sleep apnea (adult) (pediatric): Secondary | ICD-10-CM | POA: Diagnosis not present

## 2021-07-29 DIAGNOSIS — Z885 Allergy status to narcotic agent status: Secondary | ICD-10-CM | POA: Diagnosis not present

## 2021-07-29 LAB — CBC
HCT: 36.6 % — ABNORMAL LOW (ref 39.0–52.0)
Hemoglobin: 12.6 g/dL — ABNORMAL LOW (ref 13.0–17.0)
MCH: 31.1 pg (ref 26.0–34.0)
MCHC: 34.4 g/dL (ref 30.0–36.0)
MCV: 90.4 fL (ref 80.0–100.0)
Platelets: 116 10*3/uL — ABNORMAL LOW (ref 150–400)
RBC: 4.05 MIL/uL — ABNORMAL LOW (ref 4.22–5.81)
RDW: 13.3 % (ref 11.5–15.5)
WBC: 6.6 10*3/uL (ref 4.0–10.5)
nRBC: 0 % (ref 0.0–0.2)

## 2021-07-29 LAB — BASIC METABOLIC PANEL
Anion gap: 7 (ref 5–15)
BUN: 13 mg/dL (ref 6–20)
CO2: 24 mmol/L (ref 22–32)
Calcium: 8.8 mg/dL — ABNORMAL LOW (ref 8.9–10.3)
Chloride: 108 mmol/L (ref 98–111)
Creatinine, Ser: 1.08 mg/dL (ref 0.61–1.24)
GFR, Estimated: >60 mL/min (ref >60)
Glucose, Bld: 92 mg/dL (ref 70–99)
Potassium: 3.7 mmol/L (ref 3.5–5.1)
Sodium: 139 mmol/L (ref 135–145)

## 2021-07-29 MED ORDER — ISOSORBIDE MONONITRATE ER 30 MG PO TB24
30.0000 mg | ORAL_TABLET | Freq: Every day | ORAL | Status: DC
  Administered 2021-07-29: 30 mg via ORAL
  Filled 2021-07-29: qty 1

## 2021-07-29 MED ORDER — METOPROLOL TARTRATE 12.5 MG HALF TABLET
12.5000 mg | ORAL_TABLET | Freq: Two times a day (BID) | ORAL | Status: DC
  Administered 2021-07-29: 12.5 mg via ORAL
  Filled 2021-07-29: qty 1

## 2021-07-29 MED ORDER — POTASSIUM CHLORIDE CRYS ER 20 MEQ PO TBCR
40.0000 meq | EXTENDED_RELEASE_TABLET | Freq: Once | ORAL | Status: AC
  Administered 2021-07-29: 40 meq via ORAL
  Filled 2021-07-29: qty 2

## 2021-07-29 MED ORDER — ISOSORBIDE MONONITRATE ER 30 MG PO TB24
30.0000 mg | ORAL_TABLET | Freq: Every day | ORAL | 3 refills | Status: DC
  Filled 2021-07-29: qty 90, 90d supply, fill #0

## 2021-07-29 MED ORDER — ROSUVASTATIN CALCIUM 20 MG PO TABS: 20.0000 mg | ORAL_TABLET | Freq: Every day | ORAL | Status: DC

## 2021-07-29 MED ORDER — ROSUVASTATIN CALCIUM 20 MG PO TABS
20.0000 mg | ORAL_TABLET | Freq: Every day | ORAL | 6 refills | Status: DC
  Filled 2021-07-29: qty 30, 30d supply, fill #0

## 2021-07-29 MED ORDER — CLOPIDOGREL BISULFATE 75 MG PO TABS
75.0000 mg | ORAL_TABLET | Freq: Every day | ORAL | 3 refills | Status: DC
  Filled 2021-07-29: qty 90, 90d supply, fill #0

## 2021-07-29 MED ORDER — METOPROLOL TARTRATE 25 MG PO TABS
12.5000 mg | ORAL_TABLET | Freq: Two times a day (BID) | ORAL | 6 refills | Status: DC
  Filled 2021-07-29: qty 30, 30d supply, fill #0

## 2021-07-29 MED FILL — Nitroglycerin IV Soln 200 MCG/ML in D5W: INTRAVENOUS | Qty: 250 | Status: AC

## 2021-07-29 NOTE — Plan of Care (Signed)

## 2021-07-29 NOTE — Progress Notes (Addendum)
Progress Note  Patient Name: [name] [name] Date of Encounter: 07/29/2021  [facility] Cardiologist: Sanda Klein, MD   Subjective   Currently chest pain free. No dyspnea.   Inpatient Medications    Scheduled Meds:  allopurinol  300 mg Oral Daily   aspirin  81 mg Oral Daily   buPROPion  300 mg Oral Daily   clopidogrel  75 mg Oral Q breakfast   hydrocortisone  20 mg Oral Daily   isosorbide mononitrate  30 mg Oral Daily   levothyroxine  125 mcg Oral QAC breakfast   pantoprazole  40 mg Oral Daily   rosuvastatin  5 mg Oral Daily   sertraline  150 mg Oral Daily   sodium chloride flush  3 mL Intravenous Q12H   sodium chloride flush  3 mL Intravenous Q12H   Continuous Infusions:  sodium chloride     PRN Meds: sodium chloride, acetaminophen, ALPRAZolam, cyclobenzaprine, modafinil, ondansetron (ZOFRAN) IV, polyvinyl alcohol, rOPINIRole, sodium chloride flush, traMADol, zolpidem   Vital Signs    Vitals:   07/28/21 1717 07/28/21 1955 07/28/21 2352 07/29/21 0352  BP: 130/78 125/78 (!) 127/91 129/81  Pulse: 79 76 68 70  Resp: 18 16 14 15   Temp: 97.6 F (36.4 C) 98.1 F (36.7 C) 98.1 F (36.7 C) 98.1 F (36.7 C)  TempSrc: Oral Oral Oral Oral  SpO2:  100% 100% 100%  Weight:      Height:        Intake/Output Summary (Last 24 hours) at 07/29/2021 0839 Last data filed at 07/29/2021 0600 Gross per 24 hour  Intake 1050.78 ml  Output 1500 ml  Net -449.22 ml   Last 3 Weights 07/28/2021 07/25/2021 07/06/2021  Weight (lbs) 398 lb 398 lb 400 lb  Weight (kg) 180.532 kg 180.532 kg 181.439 kg      Telemetry    Sinus, PVCs, Venti bigeminy  - Personally Reviewed  ECG    Sinus rhythm, PVC - Personally Reviewed  Physical Exam   GEN: No acute distress.   Neck: No JVD Cardiac: RRR, no murmurs, rubs, or gallops. Right radial cath site without hematoma  Respiratory: Clear to auscultation bilaterally. GI: Soft, nontender, non-distended  MS: No edema; No  deformity. Neuro:  Nonfocal  Psych: Normal affect   Labs    High Sensitivity Troponin:  No results for input(s): TROPONINIHS in the last 720 hours.   Chemistry Recent Labs  Lab 07/28/21 0833 07/29/21 0357  NA 138 139  K 4.3 3.7  CL 106 108  CO2 22 24  GLUCOSE 103* 92  BUN 17 13  CREATININE 1.22 1.08  CALCIUM 9.2 8.8*  GFRNONAA >60 >60  ANIONGAP 10 7     Hematology Recent Labs  Lab 07/25/21 1443 07/29/21 0357  WBC 5.2 6.6  RBC 4.62 4.05*  HGB 14.0 12.6*  HCT 40.7 36.6*  MCV 88 90.4  MCH 30.3 31.1  MCHC 34.4 34.4  RDW 13.4 13.3  PLT 128* 116*    Radiology    CARDIAC CATHETERIZATION  Result Date: 07/28/2021   Prox LAD to Mid LAD lesion is 85% stenosed.   1st Mrg lesion is 25% stenosed.   Prox RCA lesion is 25% stenosed.   A drug-eluting stent was successfully placed using a STENT ONYX FRONTIER 4.0X18.   Post intervention, there is a 0% residual stenosis.   The left ventricular systolic function is normal.   LV end diastolic pressure is mildly elevated.   The left ventricular ejection fraction is 55-65% by  visual estimate. 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 The vessel is tortuous proximally. There is diffuse ectasia of the mid vessel Normal LV function Mildly elevated LVEDP Successful PCI of the proximal LAD with DES.  Complicated by side branch occlusion of small diagonal branch Plan: will treat with IV Ntg. Observe overnight. Plan DAPT for one year. Anticipate DC tomorrow if stable.    Cardiac Studies   CORONARY STENT INTERVENTION  07/29/21  LEFT HEART CATH AND CORONARY ANGIOGRAPHY   Conclusion      Prox LAD to Mid LAD lesion is 85% stenosed.   1st Mrg lesion is 25% stenosed.   Prox RCA lesion is 25% stenosed.   A drug-eluting stent was successfully placed using a STENT ONYX FRONTIER 4.0X18.   Post intervention, there is a 0% residual stenosis.   The left ventricular systolic function is normal.   LV end diastolic pressure is mildly  elevated.   The left ventricular ejection fraction is 55-65% by visual estimate.   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 The vessel is tortuous proximally. There is diffuse ectasia of the mid vessel Normal LV function Mildly elevated LVEDP Successful PCI of the proximal LAD with DES.  Complicated by side branch occlusion of small diagonal branch   Plan: will treat with IV Ntg. Observe overnight. Plan DAPT for one year. Anticipate DC tomorrow if stable.   Diagnostic Dominance: Right Intervention    Patient Profile     56 y.o. 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   CT angiography confirmed to the related coronary calcium score and also showed evidence of significant stenosis in the proximal LAD artery with CT FFR of 0.78.  Assessment & Plan    CAD - Cardiac cath showed single vessel obstructive CAD involving the proximal LAD s/p successful PCI of the proximal LAD with DES.  Complicated by side branch occlusion of small diagonal branch.  Mildly elevated LVEDP. Normal LVEF. Had chest pain post PCI>> started IV nitro with resolved pain. Will wean off nitro and start Imdur 30mg  qd. DC later today if chest pain free.  - Continue ASA, Plavix and Crestor   2. HLD  - most recent LDL is 64 - On Crestor 5mg  qd   3. PVC/Ven. Bigeminy - K 3.7 >> will give Kdur 66meq - Check Mg - Add metoprolol 12.5mg  BID   Patient can not ambulate. Plan Dc later today if successfully able to wean off nitro gtt.   For questions or updates, please contact [place] Please consult [email or web address] for contact info under        SignedLeanor Kail, PA  07/29/2021, 8:39 AM

## 2021-07-29 NOTE — Discharge Summary (Addendum)
Discharge Summary    Patient ID: [name] [name] MRN: [id number]; DOB: [birth year]  Admit date: 07/28/2021 Discharge date: 07/29/2021  PCP:  [name], MD   [facility] Providers Cardiologist:  Sanda Klein, MD   {  Discharge Diagnoses    Principal Problem:   Angina pectoris [facility]) Active Problems:   OSA (obstructive sleep apnea)   Morbid obesity ([place])   Chronic diastolic heart failure ([place])    HLD    PVC/ventricular bigeminy   Diagnostic Studies/Procedures    CORONARY STENT INTERVENTION  07/28/21  LEFT HEART CATH AND CORONARY ANGIOGRAPHY    Conclusion       Prox LAD to Mid LAD lesion is 85% stenosed.   1st Mrg lesion is 25% stenosed.   Prox RCA lesion is 25% stenosed.   A drug-eluting stent was successfully placed using a STENT ONYX FRONTIER 4.0X18.   Post intervention, there is a 0% residual stenosis.   The left ventricular systolic function is normal.   LV end diastolic pressure is mildly elevated.   The left ventricular ejection fraction is 55-65% by visual estimate.   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 The vessel is tortuous proximally. There is diffuse ectasia of the mid vessel Normal LV function Mildly elevated LVEDP Successful PCI of the proximal LAD with DES.  Complicated by side branch occlusion of small diagonal branch   Plan: will treat with IV Ntg. Observe overnight. Plan DAPT for one year. Anticipate DC tomorrow if stable.    Diagnostic Dominance: Right Intervention    History of Present Illness     [name] [name] is a 56 y.o. 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  [name] [name] is limited to transition by sliding from his electric scooter to  bed or commode.  He cannot walk.  About 20 years ago he was slim and used to be a runner but then developed steady weight gain, had to undergo multiple colon surgeries, complicated by a protracted course with abdominal wound infection requiring prolonged antibiotics.  He developed sequential bilateral Achilles tendon rupture, probably due to prolonged treatment with Cipro.  He was subsequently diagnosed with Cushing syndrome, which further limited his ability to be active due to proximal muscle weakness and atrophy.  Because of this, his ability to perform any type of physical exertion is severely limited.  [name] [name] occasionally describes symptoms of "gas" that he feels in his mid chest, that occur only when he is "rushing" and resolve promptly with rest.  They have never been severe and usually resolve within 1 or 2 minutes of resting.    CT angiography confirmed to the related coronary calcium score and also showed evidence of significant stenosis in the proximal LAD artery with CT FFR of 0.78. cath arranged for further evaluation.   Hospital Course     Consultants: None   CAD - Cardiac cath showed single vessel obstructive CAD involving the proximal LAD s/p successful PCI of the proximal LAD with DES.  Complicated by side branch occlusion of small diagonal branch.  Mildly elevated LVEDP. Normal LVEF. Had chest pain post PCI>> started IV nitro with resolved pain. He was successfully wean off nitro and started Imdur 30mg  qd without recurrent chest pain. - Continue ASA, Plavix and Crestor  - Added metoprolol titrate  2. HLD  - most recent LDL is 64 - On Crestor 5mg  qd >> increased to 20 mg daily   3. PVC/Ven. Bigeminy - K 3.7 >> Given Kdur 63meq - Add metoprolol 12.5mg  BID   The patient been seen by Dr. Ali Lowe  today and deemed ready for discharge home. All follow-up appointments have been scheduled. Discharge medications are listed below.    Did the patient have an acute coronary syndrome  (MI, NSTEMI, STEMI, etc) this admission?:  No                               Did the patient have a percutaneous coronary intervention (stent / angioplasty)?:  Yes.     Cath/PCI Registry Performance & Quality Measures: Aspirin prescribed? - Yes ADP Receptor Inhibitor (Plavix/Clopidogrel, Brilinta/Ticagrelor or Effient/Prasugrel) prescribed (includes medically managed patients)? - Yes High Intensity Statin (Lipitor 40-80mg  or Crestor 20-40mg ) prescribed? - Yes For EF <40%, was ACEI/ARB prescribed? - Not Applicable (EF >/= 81%) For EF <40%, Aldosterone Antagonist (Spironolactone or Eplerenone) prescribed? - Not Applicable (EF >/= 19%) Cardiac Rehab Phase II ordered? - Yes   Discharge Vitals Blood pressure 136/80, pulse 78, temperature (!) 97.3 F (36.3 C), temperature source Oral, resp. rate 13, height 6' (1.829 m), weight (!) 180.5 kg, SpO2 100 %.  Filed Weights   07/28/21 0832  Weight: (!) 180.5 kg    Labs & Radiologic Studies    CBC Recent Labs    07/29/21 0357  WBC 6.6  HGB 12.6*  HCT 36.6*  MCV 90.4  PLT 147*   Basic Metabolic Panel Recent Labs    07/28/21 0833 07/29/21 0357  NA 138 139  K 4.3 3.7  CL 106 108  CO2 22 24  GLUCOSE 103* 92  BUN 17 13  CREATININE 1.22 1.08  CALCIUM 9.2 8.8*   _____________  CARDIAC CATHETERIZATION  Result Date: 07/28/2021   Prox LAD to Mid LAD lesion is 85% stenosed.   1st Mrg lesion is 25% stenosed.   Prox RCA lesion is 25% stenosed.   A drug-eluting stent was successfully placed using a STENT ONYX FRONTIER 4.0X18.   Post intervention, there is a 0% residual stenosis.   The left ventricular systolic function is normal.   LV end diastolic pressure is mildly elevated.   The left ventricular ejection fraction is 55-65% by visual estimate. 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 The vessel is tortuous proximally. There is diffuse ectasia of the mid vessel Normal LV function Mildly elevated LVEDP Successful PCI of the  proximal LAD with DES.  Complicated by side branch occlusion of small diagonal branch Plan: will treat with IV Ntg. Observe overnight. Plan DAPT for one year. Anticipate DC tomorrow if stable.   CT CORONARY MORPH W/CTA COR W/SCORE W/CA W/CM &/OR WO/CM  Addendum Date: 07/20/2021   ADDENDUM REPORT: 07/20/2021 23:37 CLINICAL DATA:  79M with elevated calcium score EXAM: Cardiac/Coronary CTA TECHNIQUE: The patient was scanned on a [employer]. FINDINGS: A 100 kV prospective scan was triggered in the descending thoracic aorta at 111 HU's. Axial non-contrast 3 mm slices were carried out through the heart. The data set was analyzed on a dedicated work station and scored using the [place]. Gantry rotation speed was 250 msecs and collimation was .6 mm. 0.8 mg of sl NTG was given. The 3D data set was reconstructed in 5% intervals of the 35-75 % of the R-R cycle.  Phases were analyzed on a dedicated work station using MPR, MIP and VRT modes. The patient received 80 cc of contrast. Coronary Arteries:  Normal coronary origin.  Right dominance. RCA is a large dominant artery that gives rise to PDA and PLA. Calcified plaque in the proximal RCA causes 0-24% stenosis. Calcified plaque in the distal RCA causes 0-24% stenosis Left main is a large artery that gives rise to LAD and LCX arteries. LAD is a large vessel. Mixed plaque in the proximal LAD causes 50-69% stenosis. High risk plaque features including positive remodeling and napkin ring sign. Calcified plaque in the mid LAD causes 0-24% stenosis LCX is a non-dominant artery that gives rise to one large OM1 branch. Calcified plaque in the proximal LCX causes 0-24% stenosis. Mixed plaque in OM1 causes 0-24% stenosis. Other findings: Left Ventricle: Normal size Left Atrium: Mild enlargement Pulmonary Veins: Normal configuration Right Ventricle: Mild enlargement Right Atrium: Mild enlargement Cardiac valves: No calcifications Thoracic aorta: Mild ascending aortic  dilation measuring 66mm Pulmonary Arteries: Normal size Systemic Veins: Normal drainage Pericardium: Normal thickness IMPRESSION: 1. Coronary calcium score of 574. This was 95th percentile for age and sex matched control. 2. Normal coronary origin with right dominance. 3. Mixed plaque in the proximal LAD causes moderate (50-69%) stenosis. There are high risk plaque features including positive remodeling and napkin ring sign. 4.  Will send study for CTFFR CAD-RADS 3. Moderate stenosis. Consider symptom-guided anti-ischemic pharmacotherapy as well as risk factor modification per guideline directed care. Additional analysis with CT FFR will be submitted. Electronically Signed   By: Oswaldo Milian M.D.   On: 07/20/2021 23:37   Result Date: 07/20/2021 EXAM: OVER-READ INTERPRETATION  CT CHEST The following report is an over-read performed by radiologist Dr. Aletta Edouard of [facility], [place] on 07/20/2021. This over-read does not include interpretation of cardiac or coronary anatomy or pathology. The coronary CTA interpretation by the cardiologist is attached. COMPARISON:  Prior coronary calcium score study 05/31/2021 FINDINGS: Vascular: No significant noncardiac vascular findings. Mediastinum/Nodes: Visualized mediastinum and hilar regions demonstrate no lymphadenopathy or masses. Lungs/Pleura: Visualized lungs show no evidence of pulmonary edema, consolidation, pneumothorax, nodule or pleural fluid. Stable elevation of right hemidiaphragm. Upper Abdomen: No acute abnormality. Musculoskeletal: Multiple old bilateral rib fractures present. IMPRESSION: 1. Stable elevation of right hemidiaphragm. 2. Old bilateral rib fractures. Electronically Signed: By: Aletta Edouard M.D. On: 07/20/2021 16:35   CT CORONARY FRACTIONAL FLOW RESERVE DATA PREP  Result Date: 07/21/2021 EXAM: FFRCT ANALYSIS FINDINGS: FFRct analysis was performed on the original cardiac CT angiogram dataset. Diagrammatic representation of  the FFRct analysis is provided in a separate PDF document in PACS. This dictation was created using the PDF document and an interactive 3D model of the results. 3D model is not available in the EMR/PACS. Normal FFR range is >0.80. 1. Left Main: No significant stenosis 2. LAD: CTFFR 0.78 across lesion in proximal LAD 3. LCX: No significant stenosis 4. Ramus: No significant stenosis 5. RCA: No significant stenosis IMPRESSION: 1. CTFFR 0.78 across lesion in proximal LAD, suggesting functional significance. Consider cardiac catheterization for further evaluation Electronically Signed   By: Oswaldo Milian M.D.   On: 07/21/2021 00:10   VAS [place] ABI WITH/WO TBI  Result Date: 07/05/2021  LOWER EXTREMITY DOPPLER STUDY Patient Name:  [name] [name]  Date of Exam:   07/05/2021 Medical Rec #: [id number]          Accession #:    [id number] Date of Birth: [birth year]  Patient Gender: M Patient Age:   55 years Exam Location:  [facility] Procedure:      VAS [place] ABI WITH/WO TBI Referring Phys: [facility] --------------------------------------------------------------------------------  Indications: Decreased pedal pulses, patient endorses intermittent numbness. High Risk Factors: Hypertension.  Comparison Study: No prior studies. Performing Technologist: Darlin Coco RDMS RVT  Examination Guidelines: A complete evaluation includes at minimum, Doppler waveform signals and systolic blood pressure reading at the level of bilateral brachial, anterior tibial, and posterior tibial arteries, when vessel segments are accessible. Bilateral testing is considered an integral part of a complete examination. Photoelectric Plethysmograph (PPG) waveforms and toe systolic pressure readings are included as required and additional duplex testing as needed. Limited examinations for reoccurring indications may be performed as noted.  ABI Findings: +--------+------------------+-----+---------+--------+  Right    Rt Pressure  (mmHg) Index Waveform  Comment   +--------+------------------+-----+---------+--------+  Brachial 142                      triphasic           +--------+------------------+-----+---------+--------+  PTA      162                1.12  triphasic           +--------+------------------+-----+---------+--------+  DP       151                1.04  triphasic           +--------+------------------+-----+---------+--------+ +--------+------------------+-----+---------+-------+  Left     Lt Pressure (mmHg) Index Waveform  Comment  +--------+------------------+-----+---------+-------+  Brachial 145                      triphasic          +--------+------------------+-----+---------+-------+  PTA      164                1.13  triphasic          +--------+------------------+-----+---------+-------+  DP       153                1.06  triphasic          +--------+------------------+-----+---------+-------+  Summary: Right: Resting right ankle-brachial index is within normal range. No evidence of significant right lower extremity arterial disease. Left: Resting left ankle-brachial index is within normal range. No evidence of significant left lower extremity arterial disease.  *See table(s) above for measurements and observations.  Electronically signed by Deitra Mayo MD on 07/05/2021 at 3:46:05 PM.    Final    Disposition   Pt is being discharged home today in good condition.  Follow-up Plans & Appointments   Dr. Sallyanne Kuster 08/24/21  Discharge Instructions     Amb Referral to Cardiac Rehabilitation   Complete by: As directed    Diagnosis: Coronary Stents   After initial evaluation and assessments completed: Virtual Based Care may be provided alone or in conjunction with Phase 2 Cardiac Rehab based on patient barriers.: Yes   Diet - low sodium heart healthy   Complete by: As directed    Discharge instructions   Complete by: As directed    No lifting over 5 lbs for 1 week. No sexual activity for 1 week.  Keep  procedure site clean & dry. If you notice increased pain, swelling, bleeding or pus, call/return!  You may shower, but no soaking baths/hot tubs/pools for 1 week.   Increase activity  slowly   Complete by: As directed        Discharge Medications   Allergies as of 07/29/2021       Reactions   Morphine Hives, Itching, Rash   Penicillins Anaphylaxis        Medication List     TAKE these medications    allopurinol 300 MG tablet Commonly known as: ZYLOPRIM Take 300 mg by mouth daily.   ALPRAZolam 0.25 MG tablet Commonly known as: XANAX Take 1 tablet (0.25 mg total) by mouth 3 (three) times daily as needed for anxiety or sleep (and QHS prn insomnia).   aspirin 81 MG chewable tablet Chew 81 mg by mouth daily.   buPROPion 300 MG 24 hr tablet Commonly known as: WELLBUTRIN XL Take 300 mg by mouth daily.   carboxymethylcellulose 0.5 % Soln Commonly known as: REFRESH PLUS Place 2 drops into both eyes daily as needed (for dry eyes).   clopidogrel 75 MG tablet Commonly known as: PLAVIX Take 1 tablet (75 mg total) by mouth daily with breakfast. Start taking on: July 30, 2021   cyclobenzaprine 10 MG tablet Commonly known as: FLEXERIL Take 10 mg by mouth 3 (three) times daily as needed for muscle spasms.   diclofenac sodium 1 % Gel Commonly known as: VOLTAREN Apply 4 g topically daily as needed (pain).   Diclofenac-miSOPROStol 75-0.2 MG Tbec Take 1 tablet by mouth 2 (two) times daily.   hydrocortisone 20 MG tablet Commonly known as: CORTEF Take 20 mg by mouth daily.   isosorbide mononitrate 30 MG 24 hr tablet Commonly known as: IMDUR Take 1 tablet (30 mg total) by mouth daily. Start taking on: July 30, 2021   levothyroxine 125 MCG tablet Commonly known as: SYNTHROID Take 125 mcg by mouth daily before breakfast.   metoprolol tartrate 25 MG tablet Commonly known as: LOPRESSOR Take 0.5 tablets (12.5 mg total) by mouth 2 (two) times daily.   modafinil 100  MG tablet Commonly known as: PROVIGIL Take 100 mg by mouth daily as needed (take for drowsiness).   Ozempic (1 MG/DOSE) 4 MG/3ML Sopn Generic drug: Semaglutide (1 MG/DOSE) Inject 1 mg into the skin every Sunday.   pantoprazole 40 MG tablet Commonly known as: PROTONIX Take 40 mg by mouth daily.   pseudoephedrine 120 MG 12 hr tablet Commonly known as: SUDAFED Take 120 mg by mouth every 12 (twelve) hours as needed for congestion.   rOPINIRole 0.5 MG tablet Commonly known as: REQUIP Take 0.5 mg by mouth at bedtime as needed (RLS).   rosuvastatin 20 MG tablet Commonly known as: CRESTOR Take 1 tablet (20 mg total) by mouth daily. What changed:  medication strength how much to take   sertraline 100 MG tablet Commonly known as: ZOLOFT Take 150 mg by mouth daily.   traMADol 50 MG tablet Commonly known as: ULTRAM Take 50-100 mg by mouth at bedtime as needed for moderate pain.   zolpidem 10 MG tablet Commonly known as: AMBIEN Take 1 tablet (10 mg total) by mouth at bedtime as needed for sleep. What changed: when to take this           Outstanding Labs/Studies    Lipid panel and LFTs in 8 weeks   Duration of Discharge Encounter   Greater than 30 minutes including physician time.  SignedCrista Luria [place], PA 07/29/2021, 1:29 PM  ATTENDING ATTESTATION:  After conducting a review of all available clinical information with the care team, interviewing the patient, and performing a physical exam, I  agree with the findings and plan described in this note.   GEN: No acute distress.   Cardiac: RRR, no murmurs, rubs, or gallops.  Respiratory: Clear to auscultation bilaterally. GI: Soft, nontender, non-distended  MS: No edema; No deformity. Neuro:  Nonfocal  Vasc:  +2 radial pulses  Patient doing well after outpatient PCI of pLAD.  Cont DAPT, statin, BB.  Discharge today with appropriate follow up and cardiac rehab.  Lenna Sciara, MD Pager [phone]

## 2021-07-29 NOTE — Care Management (Signed)
1233 07-29-21 Case Manager spoke with patient and he will have transportation home with his motorized wheelchair. No further home needs identified at this time.

## 2021-07-29 NOTE — Progress Notes (Signed)
CARDIAC REHAB PHASE I   Stent education completed with pt. Pt educated on importance of ASA and Plavix. Pt given heart healthy diet. Reviewed site care, and restrictions. Encouraged mobility as able with emphasis on safety. Will refer to CRP II [place].  [id number] Rufina Falco, RN BSN 07/29/2021 10:37 AM

## 2021-08-01 ENCOUNTER — Other Ambulatory Visit (HOSPITAL_COMMUNITY): Payer: Self-pay

## 2021-08-01 ENCOUNTER — Telehealth (HOSPITAL_COMMUNITY): Payer: Self-pay

## 2021-08-01 NOTE — Telephone Encounter (Signed)
Pharmacy Transitions of Care Follow-up Telephone Call  Date of discharge: 07/29/21  Discharge Diagnosis: stent placement  How have you been since you were released from the hospital? Patient has been experiencing headaches since discharge. Encouraged patient to get a blood pressure monitor for home to make sure blood pressure is not contributing to headaches, but patient has recently started Imdur and is experiencing headaches around the same time he takes the medication. Encouraged patient to speak with MD about possibly switching meds before follow up poon 08/24/21. Patient has also been experiencing muscle aches after statin dose increase and was encouraged to speak with MD about decreasing dose or switching to another statin. Counseled patient on rhabdomyolysis side effects and presentation. No questions about meds at this time.   Medication changes made at discharge:     START taking: clopidogrel (PLAVIX)  isosorbide mononitrate (IMDUR)  metoprolol tartrate (LOPRESSOR)  CHANGE how you take: rosuvastatin (CRESTOR)   Medication changes verified by the patient? Yes    Medication Accessibility:  Home Pharmacy: [facility] [address]   Was the patient provided with refills on discharged medications? Yes   Have all prescriptions been transferred from [facility] to home pharmacy? Yes   Is the patient able to afford medications? Has insurance    Medication Review:  CLOPIDOGREL (PLAVIX) Clopidogrel 75 mg once daily.  - Advised patient of medications to avoid (NSAIDs, ASA)  - Educated that Tylenol (acetaminophen) will be the preferred analgesic to prevent risk of bleeding  - Emphasized importance of monitoring for signs and symptoms of bleeding (abnormal bruising, prolonged bleeding, nose bleeds, bleeding from gums, discolored urine, black tarry stools)  - Advised patient to alert all providers of anticoagulation therapy prior to starting a new medication or having a procedure   Follow-up  Appointments:  [facility] f/u appt confirmed? Scheduled to see Dr. Sallyanne Kuster on 08/24/21 @ 4:30pm.   If their condition worsens, is the pt aware to call PCP or go to the Emergency Dept.? Yes  Final Patient Assessment: Patient has f/u and refills at home pharmacy

## 2021-08-08 DIAGNOSIS — Z09 Encounter for follow-up examination after completed treatment for conditions other than malignant neoplasm: Secondary | ICD-10-CM | POA: Diagnosis not present

## 2021-08-08 DIAGNOSIS — I5030 Unspecified diastolic (congestive) heart failure: Secondary | ICD-10-CM | POA: Diagnosis not present

## 2021-08-08 DIAGNOSIS — I10 Essential (primary) hypertension: Secondary | ICD-10-CM | POA: Diagnosis not present

## 2021-08-08 DIAGNOSIS — R7303 Prediabetes: Secondary | ICD-10-CM | POA: Diagnosis not present

## 2021-08-08 DIAGNOSIS — Z955 Presence of coronary angioplasty implant and graft: Secondary | ICD-10-CM | POA: Diagnosis not present

## 2021-08-08 DIAGNOSIS — Z79899 Other long term (current) drug therapy: Secondary | ICD-10-CM | POA: Diagnosis not present

## 2021-08-08 DIAGNOSIS — I25118 Atherosclerotic heart disease of native coronary artery with other forms of angina pectoris: Secondary | ICD-10-CM | POA: Diagnosis not present

## 2021-08-10 ENCOUNTER — Telehealth (HOSPITAL_COMMUNITY): Payer: Self-pay

## 2021-08-10 NOTE — Telephone Encounter (Signed)
Pt returned CR phone call and stated he is unable to do CR due to him not being able to walk/limited mobility. ?  ?Closed referral ?

## 2021-08-16 DIAGNOSIS — H2513 Age-related nuclear cataract, bilateral: Secondary | ICD-10-CM | POA: Diagnosis not present

## 2021-08-16 DIAGNOSIS — H43813 Vitreous degeneration, bilateral: Secondary | ICD-10-CM | POA: Diagnosis not present

## 2021-08-16 DIAGNOSIS — H524 Presbyopia: Secondary | ICD-10-CM | POA: Diagnosis not present

## 2021-08-16 DIAGNOSIS — D352 Benign neoplasm of pituitary gland: Secondary | ICD-10-CM | POA: Diagnosis not present

## 2021-08-23 DIAGNOSIS — I10 Essential (primary) hypertension: Secondary | ICD-10-CM | POA: Diagnosis not present

## 2021-08-23 DIAGNOSIS — Z7952 Long term (current) use of systemic steroids: Secondary | ICD-10-CM | POA: Diagnosis not present

## 2021-08-23 DIAGNOSIS — R7303 Prediabetes: Secondary | ICD-10-CM | POA: Diagnosis not present

## 2021-08-23 DIAGNOSIS — I25118 Atherosclerotic heart disease of native coronary artery with other forms of angina pectoris: Secondary | ICD-10-CM | POA: Diagnosis not present

## 2021-08-23 DIAGNOSIS — E2740 Unspecified adrenocortical insufficiency: Secondary | ICD-10-CM | POA: Diagnosis not present

## 2021-08-23 DIAGNOSIS — E038 Other specified hypothyroidism: Secondary | ICD-10-CM | POA: Diagnosis not present

## 2021-08-23 DIAGNOSIS — Z79899 Other long term (current) drug therapy: Secondary | ICD-10-CM | POA: Diagnosis not present

## 2021-08-23 DIAGNOSIS — Z955 Presence of coronary angioplasty implant and graft: Secondary | ICD-10-CM | POA: Diagnosis not present

## 2021-08-23 DIAGNOSIS — E230 Hypopituitarism: Secondary | ICD-10-CM | POA: Diagnosis not present

## 2021-08-23 DIAGNOSIS — E240 Pituitary-dependent Cushing's disease: Secondary | ICD-10-CM | POA: Diagnosis not present

## 2021-08-24 ENCOUNTER — Encounter: Payer: Self-pay | Admitting: Cardiovascular Disease

## 2021-08-24 ENCOUNTER — Other Ambulatory Visit: Payer: Self-pay

## 2021-08-24 ENCOUNTER — Other Ambulatory Visit: Payer: Self-pay | Admitting: Endocrinology

## 2021-08-24 ENCOUNTER — Ambulatory Visit (INDEPENDENT_AMBULATORY_CARE_PROVIDER_SITE_OTHER): Payer: Medicare HMO | Admitting: Cardiovascular Disease

## 2021-08-24 VITALS — BP 136/80 | Ht 72.0 in | Wt 398.0 lb

## 2021-08-24 DIAGNOSIS — E039 Hypothyroidism, unspecified: Secondary | ICD-10-CM | POA: Diagnosis not present

## 2021-08-24 DIAGNOSIS — Z7952 Long term (current) use of systemic steroids: Secondary | ICD-10-CM

## 2021-08-24 DIAGNOSIS — I25118 Atherosclerotic heart disease of native coronary artery with other forms of angina pectoris: Secondary | ICD-10-CM

## 2021-08-24 DIAGNOSIS — Z8639 Personal history of other endocrine, nutritional and metabolic disease: Secondary | ICD-10-CM

## 2021-08-24 DIAGNOSIS — E785 Hyperlipidemia, unspecified: Secondary | ICD-10-CM

## 2021-08-24 NOTE — Progress Notes (Signed)
[facility] ?Cardiology Office Note:   ? ?Date:  08/24/2021  ? ?ID:  [name] [name], DOB Sep 14, 1965, MRN [id number] ? ?PCP:  [name], MD ?  ?[facility] Providers ?Cardiologist:  Sanda Klein, MD    ? ?Referring MD: [name], MD  ? ?Chief Complaint  ?Patient presents with  ? Coronary Artery Disease  ? ?History of Present Illness:   ? ?[name] [name] is a 56 y.o. 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 ? ?CT angiography confirmed to the related coronary calcium score and also showed evidence of significant stenosis in the proximal LAD artery with CT FFR of 0.78.  He subsequent underwent coronary angiography that confirmed the presence of a high-grade stenosis in the proximal LAD artery.  This was treated with placement of a drug-eluting stent with good angiographic results, but with "jailing" of the second diagonal artery.  He did have some angina following the procedure and was discharged home with isosorbide mononitrate.  He has not had any chest pain since returning home.  The radial artery site has healed very well.  He has no cardiovascular complaints today. ? ?The patient specifically denies any chest pain at rest exertion, dyspnea at rest or with exertion, orthopnea, paroxysmal nocturnal dyspnea, syncope, palpitations, focal neurological deficits, intermittent claudication, lower extremity edema, unexplained weight gain, cough, hemoptysis or wheezing.  ? ?Started on statin therapy about 3 months ago.  He had labs checked yesterday with his primary care provider but we do not have those results at this time. ? ?[name] is limited to transition by sliding from his electric scooter to bed or commode.   He cannot walk.  About 20 years ago he was slim and used to be a runner but then developed steady weight gain, had to undergo multiple colon surgeries, complicated by a protracted course with abdominal wound infection requiring prolonged antibiotics.  He developed sequential bilateral Achilles tendon rupture, probably due to prolonged treatment with Cipro.  He was subsequently diagnosed with Cushing syndrome, which further limited his ability to be active due to proximal muscle weakness and atrophy.  Because of this, his ability to perform any type of physical exertion is severely limited. ? ?He had normal ABIs on 06/15/2021. ? ?An echocardiogram in November 2015 describes an EF of 45-50% with inferior wall hypokinesis. ? ?He has normal renal function.  He does not have diabetes mellitus but is taking Ozempic for weight loss and has managed to lose 60 pounds since therapy initiation.  His lipid profile showed an LDL cholesterol of 84 and he has started on rosuvastatin 5 mg daily.  Labs on 07/11/2021 showed that his LDL is now down to 64, but HDL is also lower at 35. ? ?His paternal grandfather (smoker) died of a myocardial infarction at age 15 and he has 2 paternal uncles that also died of myocardial infarction, albeit at more advanced ages.  His grandmother also had coronary disease despite a very healthy lifestyle and lean body habitus. ? ?Past Medical History:  ?Diagnosis Date  ? Allergic rhinitis   ? Anxiety   ? Arthritis   ? Depression   ? Diastolic CHF ([place])   ? Gout   ? H/O acute respiratory failure   ? Hypertension   ? Morbid obesity ([place])   ?  Nephrolithiasis   ? Peripheral neuropathy   ? Pituitary tumor   ? PONV (postoperative nausea and vomiting)   ? Sleep apnea   ? uses bi-pap  ? ? ?Past Surgical History:  ?Procedure Laterality Date  ? ABDOMINAL SURGERY  06/2013  ? for cyst with MRSA  ? ACHILLES TENDON REPAIR Bilateral   ? APPLICATION OF A-CELL OF CHEST/ABDOMEN N/A 01/26/2014  ? Procedure: PLACEMENT OF A  CELL AND VAC ;  Surgeon: [name], DO;  Location: [place];  Service: Plastics;  Laterality: N/A;  ? APPLICATION OF A-CELL OF CHEST/ABDOMEN N/A 02/02/2014  ? Procedure: WITH PLACEMENT OF A CELL AND VAC ;  Surgeon: [name], DO;  Location: [place];  Service: Plastics;  Laterality: N/A;  ? BRAIN SURGERY    ? COLON RESECTION    ? CORONARY STENT INTERVENTION N/A 07/28/2021  ? Procedure: CORONARY STENT INTERVENTION;  Surgeon: Martinique, Peter M, MD;  Location: [place] CV LAB;  Service: Cardiovascular;  Laterality: N/A;  ? CRANIOTOMY N/A 12/10/2013  ? Procedure: Transpheniodal resection of Pituitary tumor with Dr. Radene Journey for approach;  Surgeon: Floyce Stakes, MD;  Location: [facility] NEURO ORS;  Service: Neurosurgery;  Laterality: N/A;  Transpheniodal resection of Pituitary tumor with Dr. Radene Journey for approach  ? HERNIA REPAIR    ? INCISION AND DRAINAGE OF WOUND N/A 01/26/2014  ? Procedure: IRRIGATION AND DEBRIDEMENT ABDOMINAL WOUND WITH ;  Surgeon: [name], DO;  Location: [place];  Service: Plastics;  Laterality: N/A;  ? INCISION AND DRAINAGE OF WOUND N/A 02/02/2014  ? Procedure: IRRIGATION AND DEBRIDEMENT ABDOMINAL WOUND;  Surgeon: [name], DO;  Location: [place];  Service: Plastics;  Laterality: N/A;  ? INCISION AND DRAINAGE OF WOUND N/A 02/12/2014  ? Procedure: IRRIGATION AND DEBRIDEMENT OF ABDOMINAL WOUND/PLACEMENT OF A-CELL AND VAC;  Surgeon: [name], DO;  Location: [place];  Service: Plastics;  Laterality: N/A;  ? INCISION AND DRAINAGE OF WOUND N/A 04/01/2014  ? Procedure: IRRIGATION AND DEBRIDEMENT ABDOMINAL WOUND WITH PLACEMENT OF ACELL/VAC;  Surgeon: [name], DO;  Location: [place];  Service: Plastics;  Laterality: N/A;  ? LEFT HEART CATH AND CORONARY ANGIOGRAPHY N/A 07/28/2021  ? Procedure: LEFT HEART CATH AND CORONARY ANGIOGRAPHY;  Surgeon: Martinique, Peter M, MD;  Location: [place] CV [facility];  Service: Cardiovascular;  Laterality: N/A;  ? Sigmoid Colonoscopy    ? TRANSNASAL APPROACH N/A  12/10/2013  ? Procedure: TRANSNASAL APPROACH;  Surgeon: Rozetta Nunnery, MD;  Location: [facility] NEURO ORS;  Service: ENT;  Laterality: N/A;  ? ? ?Current Medications: ?No outpatient medications have been marked as taking for the 08/24/21 encounter (Office Visit) with Sanda Klein, MD.  ?  ? ?Allergies:   Morphine and Penicillins  ? ?Social History  ? ?Socioeconomic History  ? Marital status: Single  ?  Spouse name: Not on file  ? Number of children: Not on file  ? Years of education: Not on file  ? Highest education level: Not on file  ?Occupational History  ? Not on file  ?Tobacco Use  ? Smoking status: Never  ? Smokeless tobacco: Never  ?Vaping Use  ? Vaping Use: Never used  ?Substance and Sexual Activity  ? Alcohol use: No  ? Drug use: No  ? Sexual activity: Never  ?Other Topics Concern  ? Not on file  ?Social History Narrative  ? Not on file  ? ?Social Determinants of Health  ? ?Financial Resource Strain: Not on file  ?Food Insecurity: Not  on file  ?Transportation Needs: Not on file  ?Physical Activity: Not on file  ?Stress: Not on file  ?Social Connections: Not on file  ?  ? ?Family History: ?The patient's family history includes Cancer in his maternal aunt and maternal grandmother; Gout in his brother and father; Hypertension in his father and mother; Hypothyroidism in his father. ? ?ROS:   ?Please see the history of present illness.    ? All other systems reviewed and are negative. ? ?EKGs/Labs/Other Studies Reviewed:   ? ?The following studies were reviewed today: ? ?Echocardiogram 12/19/2013 ? ?- Left ventricle: The cavity size was normal. Wall thickness was  ?  increased in a pattern of mild LVH. Systolic function was mildly  ?  reduced. The estimated ejection fraction was in the range of 45%  ?  to 50%. There is hypokinesis of the inferior myocardium.  ?- Mitral valve: There was mild regurgitation.  ?- Right ventricle: The cavity size was moderately dilated. Wall  ?  thickness was normal. Systolic  function was mildly reduced.  ?  ? ?Cardiac catheterization 07/28/2021 ? ?  Prox LAD to Mid LAD lesion is 85% stenosed. ?  1st Mrg lesion is 25% stenosed. ?  Prox RCA lesion is 25% stenosed. ?  A drug-eluting st

## 2021-08-24 NOTE — Patient Instructions (Signed)

## 2021-09-08 ENCOUNTER — Other Ambulatory Visit: Payer: Self-pay | Admitting: *Deleted

## 2021-09-08 DIAGNOSIS — D352 Benign neoplasm of pituitary gland: Secondary | ICD-10-CM

## 2021-09-12 ENCOUNTER — Other Ambulatory Visit: Payer: Self-pay | Admitting: Radiation Therapy

## 2021-09-12 ENCOUNTER — Telehealth: Payer: Self-pay | Admitting: Internal Medicine

## 2021-09-12 NOTE — Telephone Encounter (Signed)
Per 4/10 inbasket. Left message with pt with appointment details.  Left call back number if changes are needed.  ?

## 2021-10-06 ENCOUNTER — Ambulatory Visit
Admission: RE | Admit: 2021-10-06 | Discharge: 2021-10-06 | Disposition: A | Discharge status: Home / Self Care | Payer: Medicare HMO | Source: Ambulatory Visit | Attending: Internal Medicine | Admitting: Internal Medicine

## 2021-10-06 DIAGNOSIS — D352 Benign neoplasm of pituitary gland: Secondary | ICD-10-CM

## 2021-10-06 DIAGNOSIS — E237 Disorder of pituitary gland, unspecified: Secondary | ICD-10-CM | POA: Diagnosis not present

## 2021-10-06 DIAGNOSIS — R9082 White matter disease, unspecified: Secondary | ICD-10-CM | POA: Diagnosis not present

## 2021-10-06 DIAGNOSIS — Z9889 Other specified postprocedural states: Secondary | ICD-10-CM | POA: Diagnosis not present

## 2021-10-06 MED ORDER — GADOBENATE DIMEGLUMINE 529 MG/ML IV SOLN
10.0000 mL | Freq: Once | INTRAVENOUS | Status: AC | PRN
  Administered 2021-10-06: 10 mL via INTRAVENOUS

## 2021-10-13 ENCOUNTER — Inpatient Hospital Stay: Payer: Medicare HMO | Attending: Internal Medicine | Admitting: Internal Medicine

## 2021-10-13 DIAGNOSIS — D352 Benign neoplasm of pituitary gland: Secondary | ICD-10-CM | POA: Diagnosis not present

## 2021-10-13 NOTE — Progress Notes (Signed)
I connected with [name] [name] on 10/13/21 at 10:00 AM EDT by telephone visit and verified that I am speaking with the correct person using two identifiers.  ?I discussed the limitations, risks, security and privacy concerns of performing an evaluation and management service by telemedicine and the availability of in-person appointments. I also discussed with the patient that there may be a patient responsible charge related to this service. The patient expressed understanding and agreed to proceed.  ?Other persons participating in the visit and their role in the encounter:  n/a  ?Patient's location:  Home  ?Provider's location:  Office  ?Chief Complaint:  Pituitary adenoma ([place]) ? ?History of Present Ilness: [name] [name] describes no new or progressive neurologic changes.  No seizures or headaches.  He did undergo cardiac stent placement in February, no significant issues.  Remains active, independent.  ?Observations: Language and cognition at baseline ? ?Imaging: ? ?[facility] Clinician Interpretation: I have personally reviewed the CNS images as listed.  My interpretation, in the context of the patient's clinical presentation, is stable disease ? ?MR Brain W Wo Contrast ? ?Result Date: 10/07/2021 ?CLINICAL DATA:  56 year old male with chronic pituitary adenoma. Prior surgery and radiation. Restaging. EXAM: MRI HEAD WITHOUT AND WITH CONTRAST TECHNIQUE: Multiplanar, multiecho pulse sequences of the brain and surrounding structures were obtained without and with intravenous contrast. CONTRAST:  62m MULTIHANCE GADOBENATE DIMEGLUMINE 529 MG/ML IV SOLN COMPARISON:  Brain MRI 03/29/2020 and earlier. FINDINGS: Brain: Pituitary detail is below. Stable cerebral volume since 2021. No restricted diffusion to suggest acute infarction. No midline shift, mass effect, evidence of mass lesion, ventriculomegaly, extra-axial collection or acute intracranial hemorrhage. Cervicomedullary junction is within normal limits. Scattered  nonspecific cerebral white matter T2 and FLAIR hyperintensity is stable and mild to moderate for age. Moderate chronic T2 and FLAIR signal heterogeneity in the pons is eccentric to the right and stable. No cortical encephalomalacia or chronic cerebral blood products identified. No abnormal gray or white matter enhancement identified. Vascular: Major intracranial vascular flow voids are stable since 2021, with tortuous cavernous ICAs. Skull and upper cervical spine: Negative visible cervical spine. Background bone marrow signal is within normal limits. Chronic postoperative changes to the sella turcica. Sinuses/Orbits: Negative orbits. Paranasal sinuses remain well aerated. Mastoids are clear. Visible internal auditory structures appear normal. Other: Dedicated thin slice pre- and postcontrast pituitary imaging. Chronic postoperative changes to the bony sella turcica with chronic partially empty sella appearance. Chronic deviation of the pituitary infundibulum to the left, infundibulum demonstrates fairly homogeneous enhancement. No suprasellar mass or mass effect. Hypothalamus remains normal. Cavernous sinuses appear stable since 2021 and within normal limits. No residual discrete pituitary mass or lesion identified. IMPRESSION: 1. Stable since 2021 and satisfactory post treatment appearance of the pituitary region. 2. Mild to moderate for age chronic cerebral white matter and pontine signal changes, most commonly due to small vessel disease. No acute intracranial abnormality. Electronically Signed   By: HGenevie AnnM.D.   On: 10/07/2021 08:57   ? ?Assessment and Plan: [place] ? ?Clinically and radiographically stable today.  No concerning clinical changes. ? ?Follow Up Instructions: ? ?We ask that [name] [name] return to clinic in 2 years following next brain MRI, or sooner as needed. ? ?I discussed the assessment and treatment plan with the patient.  The patient was provided an opportunity to ask  questions and all were answered.  The patient agreed with the plan and demonstrated understanding of the instructions.   ? ?The patient was  advised to call back or seek an in-person evaluation if the symptoms worsen or if the condition fails to improve as anticipated.  I provided 5-10 minutes of non-face-to-face time during this enocunter. ? ?Ventura Sellers, MD ? ? ?I provided 15 minutes of non face-to-face telephone visit time during this encounter, and > 50% was spent counseling as documented under my assessment & plan.  ? ?

## 2021-12-16 ENCOUNTER — Other Ambulatory Visit: Payer: Self-pay | Admitting: Cardiovascular Disease

## 2021-12-29 DIAGNOSIS — G510 Bell's palsy: Secondary | ICD-10-CM | POA: Diagnosis not present

## 2022-01-16 DIAGNOSIS — I10 Essential (primary) hypertension: Secondary | ICD-10-CM | POA: Diagnosis not present

## 2022-01-16 DIAGNOSIS — Z125 Encounter for screening for malignant neoplasm of prostate: Secondary | ICD-10-CM | POA: Diagnosis not present

## 2022-01-16 DIAGNOSIS — R7303 Prediabetes: Secondary | ICD-10-CM | POA: Diagnosis not present

## 2022-01-18 DIAGNOSIS — I10 Essential (primary) hypertension: Secondary | ICD-10-CM | POA: Diagnosis not present

## 2022-01-18 DIAGNOSIS — R7303 Prediabetes: Secondary | ICD-10-CM | POA: Diagnosis not present

## 2022-01-18 DIAGNOSIS — Z125 Encounter for screening for malignant neoplasm of prostate: Secondary | ICD-10-CM | POA: Diagnosis not present

## 2022-01-19 DIAGNOSIS — I25118 Atherosclerotic heart disease of native coronary artery with other forms of angina pectoris: Secondary | ICD-10-CM | POA: Diagnosis not present

## 2022-01-19 DIAGNOSIS — E230 Hypopituitarism: Secondary | ICD-10-CM | POA: Diagnosis not present

## 2022-01-19 DIAGNOSIS — G4733 Obstructive sleep apnea (adult) (pediatric): Secondary | ICD-10-CM | POA: Diagnosis not present

## 2022-01-19 DIAGNOSIS — Z0000 Encounter for general adult medical examination without abnormal findings: Secondary | ICD-10-CM | POA: Diagnosis not present

## 2022-01-19 DIAGNOSIS — G2581 Restless legs syndrome: Secondary | ICD-10-CM | POA: Diagnosis not present

## 2022-01-19 DIAGNOSIS — E248 Other Cushing's syndrome: Secondary | ICD-10-CM | POA: Diagnosis not present

## 2022-01-19 DIAGNOSIS — F339 Major depressive disorder, recurrent, unspecified: Secondary | ICD-10-CM | POA: Diagnosis not present

## 2022-01-19 DIAGNOSIS — D696 Thrombocytopenia, unspecified: Secondary | ICD-10-CM | POA: Diagnosis not present

## 2022-01-19 DIAGNOSIS — E038 Other specified hypothyroidism: Secondary | ICD-10-CM | POA: Diagnosis not present

## 2022-05-08 ENCOUNTER — Emergency Department (HOSPITAL_COMMUNITY): Payer: Medicare HMO

## 2022-05-08 ENCOUNTER — Encounter (HOSPITAL_COMMUNITY): Payer: Self-pay

## 2022-05-08 ENCOUNTER — Emergency Department (HOSPITAL_COMMUNITY)
Admission: EM | Admit: 2022-05-08 | Discharge: 2022-05-09 | Discharge status: Against Medical Advice | Payer: Medicare HMO | Source: Home / Self Care

## 2022-05-08 ENCOUNTER — Ambulatory Visit: Payer: Self-pay

## 2022-05-08 ENCOUNTER — Other Ambulatory Visit: Payer: Self-pay

## 2022-05-08 DIAGNOSIS — E662 Morbid (severe) obesity with alveolar hypoventilation: Secondary | ICD-10-CM | POA: Diagnosis present

## 2022-05-08 DIAGNOSIS — D352 Benign neoplasm of pituitary gland: Secondary | ICD-10-CM | POA: Diagnosis not present

## 2022-05-08 DIAGNOSIS — I110 Hypertensive heart disease with heart failure: Secondary | ICD-10-CM | POA: Diagnosis present

## 2022-05-08 DIAGNOSIS — N201 Calculus of ureter: Secondary | ICD-10-CM | POA: Diagnosis not present

## 2022-05-08 DIAGNOSIS — N10 Acute tubulo-interstitial nephritis: Secondary | ICD-10-CM | POA: Diagnosis not present

## 2022-05-08 DIAGNOSIS — Z5321 Procedure and treatment not carried out due to patient leaving prior to being seen by health care provider: Secondary | ICD-10-CM | POA: Insufficient documentation

## 2022-05-08 DIAGNOSIS — E876 Hypokalemia: Secondary | ICD-10-CM | POA: Diagnosis not present

## 2022-05-08 DIAGNOSIS — N390 Urinary tract infection, site not specified: Secondary | ICD-10-CM | POA: Diagnosis not present

## 2022-05-08 DIAGNOSIS — N1330 Unspecified hydronephrosis: Secondary | ICD-10-CM | POA: Diagnosis not present

## 2022-05-08 DIAGNOSIS — E893 Postprocedural hypopituitarism: Secondary | ICD-10-CM | POA: Diagnosis present

## 2022-05-08 DIAGNOSIS — B999 Unspecified infectious disease: Secondary | ICD-10-CM | POA: Diagnosis not present

## 2022-05-08 DIAGNOSIS — F32A Depression, unspecified: Secondary | ICD-10-CM | POA: Diagnosis present

## 2022-05-08 DIAGNOSIS — Z800 Family history of malignant neoplasm of digestive organs: Secondary | ICD-10-CM | POA: Diagnosis not present

## 2022-05-08 DIAGNOSIS — G4733 Obstructive sleep apnea (adult) (pediatric): Secondary | ICD-10-CM | POA: Diagnosis not present

## 2022-05-08 DIAGNOSIS — N130 Hydronephrosis with ureteropelvic junction obstruction: Secondary | ICD-10-CM | POA: Diagnosis not present

## 2022-05-08 DIAGNOSIS — Z880 Allergy status to penicillin: Secondary | ICD-10-CM | POA: Diagnosis not present

## 2022-05-08 DIAGNOSIS — K5730 Diverticulosis of large intestine without perforation or abscess without bleeding: Secondary | ICD-10-CM | POA: Diagnosis not present

## 2022-05-08 DIAGNOSIS — R1030 Lower abdominal pain, unspecified: Secondary | ICD-10-CM | POA: Insufficient documentation

## 2022-05-08 DIAGNOSIS — E230 Hypopituitarism: Secondary | ICD-10-CM | POA: Diagnosis not present

## 2022-05-08 DIAGNOSIS — N400 Enlarged prostate without lower urinary tract symptoms: Secondary | ICD-10-CM | POA: Diagnosis present

## 2022-05-08 DIAGNOSIS — R0789 Other chest pain: Secondary | ICD-10-CM | POA: Diagnosis not present

## 2022-05-08 DIAGNOSIS — I209 Angina pectoris, unspecified: Secondary | ICD-10-CM | POA: Diagnosis not present

## 2022-05-08 DIAGNOSIS — I5032 Chronic diastolic (congestive) heart failure: Secondary | ICD-10-CM | POA: Diagnosis present

## 2022-05-08 DIAGNOSIS — K449 Diaphragmatic hernia without obstruction or gangrene: Secondary | ICD-10-CM | POA: Diagnosis not present

## 2022-05-08 DIAGNOSIS — K760 Fatty (change of) liver, not elsewhere classified: Secondary | ICD-10-CM | POA: Diagnosis not present

## 2022-05-08 DIAGNOSIS — N281 Cyst of kidney, acquired: Secondary | ICD-10-CM | POA: Diagnosis not present

## 2022-05-08 DIAGNOSIS — R5381 Other malaise: Secondary | ICD-10-CM | POA: Diagnosis not present

## 2022-05-08 DIAGNOSIS — E782 Mixed hyperlipidemia: Secondary | ICD-10-CM | POA: Diagnosis present

## 2022-05-08 DIAGNOSIS — Z6841 Body Mass Index (BMI) 40.0 and over, adult: Secondary | ICD-10-CM | POA: Diagnosis not present

## 2022-05-08 DIAGNOSIS — R0989 Other specified symptoms and signs involving the circulatory and respiratory systems: Secondary | ICD-10-CM | POA: Diagnosis not present

## 2022-05-08 DIAGNOSIS — N136 Pyonephrosis: Secondary | ICD-10-CM | POA: Diagnosis present

## 2022-05-08 DIAGNOSIS — M109 Gout, unspecified: Secondary | ICD-10-CM | POA: Diagnosis present

## 2022-05-08 DIAGNOSIS — E039 Hypothyroidism, unspecified: Secondary | ICD-10-CM | POA: Diagnosis present

## 2022-05-08 DIAGNOSIS — R197 Diarrhea, unspecified: Secondary | ICD-10-CM | POA: Diagnosis not present

## 2022-05-08 DIAGNOSIS — I2510 Atherosclerotic heart disease of native coronary artery without angina pectoris: Secondary | ICD-10-CM | POA: Diagnosis present

## 2022-05-08 DIAGNOSIS — N2889 Other specified disorders of kidney and ureter: Secondary | ICD-10-CM | POA: Diagnosis not present

## 2022-05-08 DIAGNOSIS — Z20822 Contact with and (suspected) exposure to covid-19: Secondary | ICD-10-CM | POA: Diagnosis present

## 2022-05-08 DIAGNOSIS — N200 Calculus of kidney: Secondary | ICD-10-CM | POA: Diagnosis present

## 2022-05-08 DIAGNOSIS — R079 Chest pain, unspecified: Secondary | ICD-10-CM | POA: Insufficient documentation

## 2022-05-08 DIAGNOSIS — N132 Hydronephrosis with renal and ureteral calculous obstruction: Secondary | ICD-10-CM | POA: Diagnosis not present

## 2022-05-08 DIAGNOSIS — Z1152 Encounter for screening for COVID-19: Secondary | ICD-10-CM | POA: Diagnosis not present

## 2022-05-08 DIAGNOSIS — R109 Unspecified abdominal pain: Secondary | ICD-10-CM | POA: Diagnosis not present

## 2022-05-08 DIAGNOSIS — N12 Tubulo-interstitial nephritis, not specified as acute or chronic: Secondary | ICD-10-CM | POA: Diagnosis not present

## 2022-05-08 DIAGNOSIS — Z7902 Long term (current) use of antithrombotics/antiplatelets: Secondary | ICD-10-CM | POA: Diagnosis not present

## 2022-05-08 DIAGNOSIS — F419 Anxiety disorder, unspecified: Secondary | ICD-10-CM | POA: Diagnosis present

## 2022-05-08 DIAGNOSIS — N211 Calculus in urethra: Secondary | ICD-10-CM | POA: Diagnosis present

## 2022-05-08 DIAGNOSIS — J9612 Chronic respiratory failure with hypercapnia: Secondary | ICD-10-CM | POA: Diagnosis present

## 2022-05-08 DIAGNOSIS — R1084 Generalized abdominal pain: Secondary | ICD-10-CM | POA: Diagnosis not present

## 2022-05-08 DIAGNOSIS — Z803 Family history of malignant neoplasm of breast: Secondary | ICD-10-CM | POA: Diagnosis not present

## 2022-05-08 DIAGNOSIS — M1990 Unspecified osteoarthritis, unspecified site: Secondary | ICD-10-CM | POA: Diagnosis not present

## 2022-05-08 DIAGNOSIS — Z7952 Long term (current) use of systemic steroids: Secondary | ICD-10-CM | POA: Diagnosis not present

## 2022-05-08 DIAGNOSIS — I509 Heart failure, unspecified: Secondary | ICD-10-CM | POA: Diagnosis not present

## 2022-05-08 LAB — CBC WITH DIFFERENTIAL/PLATELET
Abs Immature Granulocytes: 0.06 10*3/uL (ref 0.00–0.07)
Basophils Absolute: 0 10*3/uL (ref 0.0–0.1)
Basophils Relative: 0 %
Eosinophils Absolute: 0 10*3/uL (ref 0.0–0.5)
Eosinophils Relative: 0 %
HCT: 37.2 % — ABNORMAL LOW (ref 39.0–52.0)
Hemoglobin: 13.2 g/dL (ref 13.0–17.0)
Immature Granulocytes: 1 %
Lymphocytes Relative: 7 %
Lymphs Abs: 0.7 10*3/uL (ref 0.7–4.0)
MCH: 30.3 pg (ref 26.0–34.0)
MCHC: 35.5 g/dL (ref 30.0–36.0)
MCV: 85.5 fL (ref 80.0–100.0)
Monocytes Absolute: 0.5 10*3/uL (ref 0.1–1.0)
Monocytes Relative: 5 %
Neutro Abs: 8.3 10*3/uL — ABNORMAL HIGH (ref 1.7–7.7)
Neutrophils Relative %: 87 %
Platelets: 98 10*3/uL — ABNORMAL LOW (ref 150–400)
RBC: 4.35 MIL/uL (ref 4.22–5.81)
RDW: 12.7 % (ref 11.5–15.5)
WBC: 9.6 10*3/uL (ref 4.0–10.5)
nRBC: 0 % (ref 0.0–0.2)

## 2022-05-08 LAB — I-STAT CHEM 8, ED
BUN: 13 mg/dL (ref 6–20)
Calcium, Ion: 1.06 mmol/L — ABNORMAL LOW (ref 1.15–1.40)
Chloride: 97 mmol/L — ABNORMAL LOW (ref 98–111)
Creatinine, Ser: 1.3 mg/dL — ABNORMAL HIGH (ref 0.61–1.24)
Glucose, Bld: 99 mg/dL (ref 70–99)
HCT: 38 % — ABNORMAL LOW (ref 39.0–52.0)
Hemoglobin: 12.9 g/dL — ABNORMAL LOW (ref 13.0–17.0)
Potassium: 3.5 mmol/L (ref 3.5–5.1)
Sodium: 133 mmol/L — ABNORMAL LOW (ref 135–145)
TCO2: 20 mmol/L — ABNORMAL LOW (ref 22–32)

## 2022-05-08 LAB — LIPASE, BLOOD: Lipase: 25 U/L (ref 11–51)

## 2022-05-08 LAB — COMPREHENSIVE METABOLIC PANEL
ALT: 14 U/L (ref 0–44)
AST: 19 U/L (ref 15–41)
Albumin: 2.8 g/dL — ABNORMAL LOW (ref 3.5–5.0)
Alkaline Phosphatase: 70 U/L (ref 38–126)
Anion gap: 13 (ref 5–15)
BUN: 14 mg/dL (ref 6–20)
CO2: 21 mmol/L — ABNORMAL LOW (ref 22–32)
Calcium: 8.2 mg/dL — ABNORMAL LOW (ref 8.9–10.3)
Chloride: 97 mmol/L — ABNORMAL LOW (ref 98–111)
Creatinine, Ser: 1.51 mg/dL — ABNORMAL HIGH (ref 0.61–1.24)
GFR, Estimated: 53 mL/min — ABNORMAL LOW (ref >60)
Glucose, Bld: 100 mg/dL — ABNORMAL HIGH (ref 70–99)
Potassium: 3.4 mmol/L — ABNORMAL LOW (ref 3.5–5.1)
Sodium: 131 mmol/L — ABNORMAL LOW (ref 135–145)
Total Bilirubin: 1.3 mg/dL — ABNORMAL HIGH (ref 0.3–1.2)
Total Protein: 6.4 g/dL — ABNORMAL LOW (ref 6.5–8.1)

## 2022-05-08 LAB — TROPONIN I (HIGH SENSITIVITY)
Troponin I (High Sensitivity): 73 ng/L — ABNORMAL HIGH (ref <18)
Troponin I (High Sensitivity): 46 ng/L — ABNORMAL HIGH (ref <18)

## 2022-05-08 NOTE — ED Provider Triage Note (Signed)
Emergency Medicine Provider Triage Evaluation Note  [name] [name] , a 56 y.o. male  was evaluated in triage.  Pt complains of flank pain, kidney stone, chest pain.  Patient states that on Friday began to have flank pain similar to previous kidney stone pain.  He states that he passed 1 stone on Friday.  Continued abdominal pain with some nausea over the weekend.  Today he came to the emergency department for evaluation of the same.  While lying on the stretcher he states he developed some left-sided chest pain but he is unable to describe the pain beyond stating it hurts.  Denies shortness of breath.  Review of Systems  Positive: As above Negative: As above  Physical Exam  BP (!) 130/109 (BP Location: Right Arm)   Pulse 96   Temp 99.7 F (37.6 C) (Oral)   Resp 18   Ht 6' (1.829 m)   Wt (!) 180.5 kg   SpO2 99%   BMI 53.98 kg/m  Gen:   Awake, no distress   Resp:  Normal effort  MSK:   Moves extremities without difficulty  Other:    Medical Decision Making  Medically screening exam initiated at 6:18 PM.  Appropriate orders placed.  Sally Sanderford was informed that the remainder of the evaluation will be completed by another provider, this initial triage assessment does not replace that evaluation, and the importance of remaining in the ED until their evaluation is complete.  Patient is bedbound at baseline   Ronny Bacon 05/08/22 1819

## 2022-05-08 NOTE — Telephone Encounter (Signed)
    Chief Complaint: Hs passed 2 kidney stones, low grade fever, may be dehydrated Symptoms: Above Frequency: This week Pertinent Negatives: Patient denies  Disposition: '[x]'$ ED /'[]'$ Urgent Care (no appt availability in office) / '[]'$ Appointment(In office/virtual)/ '[]'$  [facility]/ '[]'$ Home Care/ '[]'$ Refused Recommended Disposition /'[]'$ [facility]/ '[]'$  Follow-up with PCP Additional Notes:   Reason for Disposition  [1] Drinking very little AND [2] dehydration suspected (e.g., no urine > 12 hours, very dry mouth, very lightheaded)  Answer Assessment - Initial Assessment Questions 1. TEMPERATURE: "What is the most recent temperature?"  "How was it measured?"      Low grade 2. ONSET: "When did the fever start?"      This week 3. CHILLS: "Do you have chills?" If yes: "How bad are they?"  (e.g., none, mild, moderate, severe)   - NONE: no chills   - MILD: feeling cold   - MODERATE: feeling very cold, some shivering (feels better under a thick blanket)   - SEVERE: feeling extremely cold with shaking chills (general body shaking, rigors; even under a thick blanket)      Yes 4. OTHER SYMPTOMS: "Do you have any other symptoms besides the fever?"  (e.g., abdomen pain, cough, diarrhea, earache, headache, sore throat, urination pain)     Ma be dehydrated 5. CAUSE: If there are no symptoms, ask: "What do you think is causing the fever?"      Sepsis 6. CONTACTS: "Does anyone else in the family have an infection?"     No 7. TREATMENT: "What have you done so far to treat this fever?" (e.g., medications)     No 8. IMMUNOCOMPROMISE: "Do you have of the following: diabetes, HIV positive, splenectomy, cancer chemotherapy, chronic steroid treatment, transplant patient, etc."     No 9. PREGNANCY: "Is there any chance you are pregnant?" "When was your last menstrual period?"     N/A 10. TRAVEL: "Have you traveled out of the country in the last month?" (e.g., travel history, exposures)        No  Protocols used: [facility]

## 2022-05-08 NOTE — ED Triage Notes (Signed)
PER EMS: pt is from home with c/o abd pain x 3 days after passing a kidney stone associated with intermittent nausea and vomiting. He received '4mg'$  zofran IM en route by EMS. Pt is bedbound. A&OX4.  BP- 143/70, HR-90, 98% RA, CBG-92

## 2022-05-08 NOTE — ED Triage Notes (Signed)
Pt is now reporting chest pain that he states just started a few minutes ago.

## 2022-05-09 ENCOUNTER — Inpatient Hospital Stay (HOSPITAL_BASED_OUTPATIENT_CLINIC_OR_DEPARTMENT_OTHER)
Admission: EM | Admit: 2022-05-09 | Discharge: 2022-05-17 | DRG: 660 | Disposition: A | Discharge status: Home / Self Care | Payer: Medicare HMO | Attending: Internal Medicine | Admitting: Internal Medicine

## 2022-05-09 ENCOUNTER — Emergency Department (HOSPITAL_COMMUNITY): Payer: Medicare HMO | Admitting: Certified Registered"

## 2022-05-09 ENCOUNTER — Emergency Department (HOSPITAL_COMMUNITY): Payer: Medicare HMO

## 2022-05-09 ENCOUNTER — Encounter (HOSPITAL_COMMUNITY)
Admission: EM | Disposition: A | Discharge status: Home / Self Care | Payer: Self-pay | Source: Home / Self Care | Attending: Internal Medicine

## 2022-05-09 ENCOUNTER — Emergency Department (HOSPITAL_BASED_OUTPATIENT_CLINIC_OR_DEPARTMENT_OTHER): Payer: Medicare HMO

## 2022-05-09 ENCOUNTER — Encounter (HOSPITAL_COMMUNITY): Payer: Self-pay | Admitting: Certified Registered"

## 2022-05-09 DIAGNOSIS — E662 Morbid (severe) obesity with alveolar hypoventilation: Secondary | ICD-10-CM | POA: Diagnosis not present

## 2022-05-09 DIAGNOSIS — I110 Hypertensive heart disease with heart failure: Secondary | ICD-10-CM | POA: Diagnosis not present

## 2022-05-09 DIAGNOSIS — E893 Postprocedural hypopituitarism: Secondary | ICD-10-CM | POA: Diagnosis present

## 2022-05-09 DIAGNOSIS — R0989 Other specified symptoms and signs involving the circulatory and respiratory systems: Secondary | ICD-10-CM | POA: Diagnosis not present

## 2022-05-09 DIAGNOSIS — E039 Hypothyroidism, unspecified: Secondary | ICD-10-CM | POA: Diagnosis present

## 2022-05-09 DIAGNOSIS — Z20822 Contact with and (suspected) exposure to covid-19: Secondary | ICD-10-CM | POA: Diagnosis not present

## 2022-05-09 DIAGNOSIS — Z6841 Body Mass Index (BMI) 40.0 and over, adult: Secondary | ICD-10-CM

## 2022-05-09 DIAGNOSIS — N12 Tubulo-interstitial nephritis, not specified as acute or chronic: Principal | ICD-10-CM

## 2022-05-09 DIAGNOSIS — Z79899 Other long term (current) drug therapy: Secondary | ICD-10-CM

## 2022-05-09 DIAGNOSIS — Z1152 Encounter for screening for COVID-19: Secondary | ICD-10-CM

## 2022-05-09 DIAGNOSIS — I509 Heart failure, unspecified: Secondary | ICD-10-CM | POA: Diagnosis not present

## 2022-05-09 DIAGNOSIS — N281 Cyst of kidney, acquired: Secondary | ICD-10-CM | POA: Diagnosis not present

## 2022-05-09 DIAGNOSIS — E876 Hypokalemia: Secondary | ICD-10-CM | POA: Diagnosis not present

## 2022-05-09 DIAGNOSIS — E230 Hypopituitarism: Secondary | ICD-10-CM | POA: Diagnosis present

## 2022-05-09 DIAGNOSIS — Z7982 Long term (current) use of aspirin: Secondary | ICD-10-CM

## 2022-05-09 DIAGNOSIS — N211 Calculus in urethra: Secondary | ICD-10-CM | POA: Diagnosis present

## 2022-05-09 DIAGNOSIS — J9612 Chronic respiratory failure with hypercapnia: Secondary | ICD-10-CM | POA: Diagnosis not present

## 2022-05-09 DIAGNOSIS — N136 Pyonephrosis: Principal | ICD-10-CM | POA: Diagnosis present

## 2022-05-09 DIAGNOSIS — E782 Mixed hyperlipidemia: Secondary | ICD-10-CM | POA: Diagnosis present

## 2022-05-09 DIAGNOSIS — N400 Enlarged prostate without lower urinary tract symptoms: Secondary | ICD-10-CM | POA: Diagnosis present

## 2022-05-09 DIAGNOSIS — I2510 Atherosclerotic heart disease of native coronary artery without angina pectoris: Secondary | ICD-10-CM | POA: Diagnosis present

## 2022-05-09 DIAGNOSIS — Z7952 Long term (current) use of systemic steroids: Secondary | ICD-10-CM

## 2022-05-09 DIAGNOSIS — Z7902 Long term (current) use of antithrombotics/antiplatelets: Secondary | ICD-10-CM

## 2022-05-09 DIAGNOSIS — I5032 Chronic diastolic (congestive) heart failure: Secondary | ICD-10-CM | POA: Diagnosis present

## 2022-05-09 DIAGNOSIS — R197 Diarrhea, unspecified: Secondary | ICD-10-CM | POA: Diagnosis not present

## 2022-05-09 DIAGNOSIS — F32A Depression, unspecified: Secondary | ICD-10-CM | POA: Diagnosis not present

## 2022-05-09 DIAGNOSIS — B999 Unspecified infectious disease: Secondary | ICD-10-CM | POA: Diagnosis not present

## 2022-05-09 DIAGNOSIS — N200 Calculus of kidney: Secondary | ICD-10-CM | POA: Diagnosis not present

## 2022-05-09 DIAGNOSIS — Z880 Allergy status to penicillin: Secondary | ICD-10-CM

## 2022-05-09 DIAGNOSIS — M1990 Unspecified osteoarthritis, unspecified site: Secondary | ICD-10-CM | POA: Diagnosis not present

## 2022-05-09 DIAGNOSIS — Z87442 Personal history of urinary calculi: Secondary | ICD-10-CM

## 2022-05-09 DIAGNOSIS — Z8249 Family history of ischemic heart disease and other diseases of the circulatory system: Secondary | ICD-10-CM

## 2022-05-09 DIAGNOSIS — A419 Sepsis, unspecified organism: Secondary | ICD-10-CM | POA: Diagnosis present

## 2022-05-09 DIAGNOSIS — I209 Angina pectoris, unspecified: Secondary | ICD-10-CM | POA: Diagnosis not present

## 2022-05-09 DIAGNOSIS — N130 Hydronephrosis with ureteropelvic junction obstruction: Secondary | ICD-10-CM | POA: Diagnosis not present

## 2022-05-09 DIAGNOSIS — M109 Gout, unspecified: Secondary | ICD-10-CM | POA: Diagnosis not present

## 2022-05-09 DIAGNOSIS — N201 Calculus of ureter: Secondary | ICD-10-CM | POA: Diagnosis not present

## 2022-05-09 DIAGNOSIS — F419 Anxiety disorder, unspecified: Secondary | ICD-10-CM | POA: Diagnosis present

## 2022-05-09 DIAGNOSIS — Z803 Family history of malignant neoplasm of breast: Secondary | ICD-10-CM

## 2022-05-09 DIAGNOSIS — K760 Fatty (change of) liver, not elsewhere classified: Secondary | ICD-10-CM | POA: Diagnosis not present

## 2022-05-09 DIAGNOSIS — Z800 Family history of malignant neoplasm of digestive organs: Secondary | ICD-10-CM

## 2022-05-09 DIAGNOSIS — Z885 Allergy status to narcotic agent status: Secondary | ICD-10-CM

## 2022-05-09 DIAGNOSIS — R5381 Other malaise: Secondary | ICD-10-CM | POA: Diagnosis present

## 2022-05-09 DIAGNOSIS — N390 Urinary tract infection, site not specified: Secondary | ICD-10-CM | POA: Diagnosis not present

## 2022-05-09 DIAGNOSIS — N10 Acute tubulo-interstitial nephritis: Secondary | ICD-10-CM | POA: Diagnosis present

## 2022-05-09 DIAGNOSIS — Z79890 Hormone replacement therapy (postmenopausal): Secondary | ICD-10-CM

## 2022-05-09 DIAGNOSIS — D352 Benign neoplasm of pituitary gland: Secondary | ICD-10-CM | POA: Diagnosis present

## 2022-05-09 DIAGNOSIS — N2889 Other specified disorders of kidney and ureter: Secondary | ICD-10-CM | POA: Diagnosis not present

## 2022-05-09 DIAGNOSIS — G4733 Obstructive sleep apnea (adult) (pediatric): Secondary | ICD-10-CM | POA: Diagnosis present

## 2022-05-09 DIAGNOSIS — Z955 Presence of coronary angioplasty implant and graft: Secondary | ICD-10-CM

## 2022-05-09 HISTORY — PX: CYSTOSCOPY WITH RETROGRADE PYELOGRAM, URETEROSCOPY AND STENT PLACEMENT: SHX5789

## 2022-05-09 LAB — URINALYSIS, ROUTINE W REFLEX MICROSCOPIC
Bilirubin Urine: NEGATIVE
Glucose, UA: NEGATIVE mg/dL
Ketones, ur: 15 mg/dL — AB
Nitrite: POSITIVE — AB
Protein, ur: 100 mg/dL — AB
Specific Gravity, Urine: 1.016 (ref 1.005–1.030)
WBC, UA: >50 WBC/hpf — ABNORMAL HIGH (ref 0–5)
pH: 5.5 (ref 5.0–8.0)

## 2022-05-09 LAB — CBC
HCT: 39.6 % (ref 39.0–52.0)
Hemoglobin: 13.5 g/dL (ref 13.0–17.0)
MCH: 29.5 pg (ref 26.0–34.0)
MCHC: 34.1 g/dL (ref 30.0–36.0)
MCV: 86.5 fL (ref 80.0–100.0)
Platelets: 103 10*3/uL — ABNORMAL LOW (ref 150–400)
RBC: 4.58 MIL/uL (ref 4.22–5.81)
RDW: 12.8 % (ref 11.5–15.5)
WBC: 6.6 10*3/uL (ref 4.0–10.5)
nRBC: 0 % (ref 0.0–0.2)

## 2022-05-09 LAB — COMPREHENSIVE METABOLIC PANEL
ALT: 28 U/L (ref 0–44)
AST: 49 U/L — ABNORMAL HIGH (ref 15–41)
Albumin: 3.8 g/dL (ref 3.5–5.0)
Alkaline Phosphatase: 81 U/L (ref 38–126)
Anion gap: 17 — ABNORMAL HIGH (ref 5–15)
BUN: 21 mg/dL — ABNORMAL HIGH (ref 6–20)
CO2: 21 mmol/L — ABNORMAL LOW (ref 22–32)
Calcium: 8.7 mg/dL — ABNORMAL LOW (ref 8.9–10.3)
Chloride: 95 mmol/L — ABNORMAL LOW (ref 98–111)
Creatinine, Ser: 1.39 mg/dL — ABNORMAL HIGH (ref 0.61–1.24)
GFR, Estimated: 59 mL/min — ABNORMAL LOW (ref >60)
Glucose, Bld: 98 mg/dL (ref 70–99)
Potassium: 3.8 mmol/L (ref 3.5–5.1)
Sodium: 133 mmol/L — ABNORMAL LOW (ref 135–145)
Total Bilirubin: 1.1 mg/dL (ref 0.3–1.2)
Total Protein: 7.2 g/dL (ref 6.5–8.1)

## 2022-05-09 LAB — LIPASE, BLOOD: Lipase: <10 U/L — ABNORMAL LOW (ref 11–51)

## 2022-05-09 SURGERY — CYSTOURETEROSCOPY, WITH RETROGRADE PYELOGRAM AND STENT INSERTION
Anesthesia: General | Site: Urethra | Laterality: Left

## 2022-05-09 MED ORDER — ONDANSETRON HCL 4 MG/2ML IJ SOLN
4.0000 mg | Freq: Once | INTRAMUSCULAR | Status: AC
  Administered 2022-05-09: 4 mg via INTRAVENOUS
  Filled 2022-05-09: qty 2

## 2022-05-09 MED ORDER — FENTANYL CITRATE PF 50 MCG/ML IJ SOSY: 50.0000 ug | PREFILLED_SYRINGE | Freq: Once | INTRAMUSCULAR | Status: DC

## 2022-05-09 MED ORDER — CIPROFLOXACIN IN D5W 400 MG/200ML IV SOLN
400.0000 mg | Freq: Once | INTRAVENOUS | Status: AC
  Administered 2022-05-09: 400 mg via INTRAVENOUS
  Filled 2022-05-09: qty 200

## 2022-05-09 MED ORDER — ACETAMINOPHEN 500 MG PO TABS
1000.0000 mg | ORAL_TABLET | Freq: Once | ORAL | Status: AC
  Administered 2022-05-09: 1000 mg via ORAL
  Filled 2022-05-09: qty 2

## 2022-05-09 MED ORDER — METHYLPREDNISOLONE SODIUM SUCC 125 MG IJ SOLR
INTRAMUSCULAR | Status: AC
  Filled 2022-05-09: qty 2

## 2022-05-09 MED ORDER — LACTATED RINGERS IV BOLUS
1000.0000 mL | Freq: Once | INTRAVENOUS | Status: AC
  Administered 2022-05-09: 1000 mL via INTRAVENOUS

## 2022-05-09 MED ORDER — PROPOFOL 10 MG/ML IV BOLUS
INTRAVENOUS | Status: AC
  Filled 2022-05-09: qty 20

## 2022-05-09 MED ORDER — MIDAZOLAM HCL 2 MG/2ML IJ SOLN
INTRAMUSCULAR | Status: AC
  Filled 2022-05-09: qty 2

## 2022-05-09 MED ORDER — PHENYLEPHRINE HCL (PRESSORS) 10 MG/ML IV SOLN
INTRAVENOUS | Status: AC
  Filled 2022-05-09: qty 1

## 2022-05-09 MED ORDER — FENTANYL CITRATE (PF) 100 MCG/2ML IJ SOLN
INTRAMUSCULAR | Status: AC
  Filled 2022-05-09: qty 2

## 2022-05-09 SURGICAL SUPPLY — 24 items
BAG URO CATCHER STRL LF (MISCELLANEOUS) ×1 IMPLANT
BASKET ZERO TIP NITINOL 2.4FR (BASKET) IMPLANT
BSKT STON RTRVL ZERO TP 2.4FR (BASKET)
CATH URETL OPEN 5X70 (CATHETERS) ×1 IMPLANT
CLOTH BEACON ORANGE TIMEOUT ST (SAFETY) ×1 IMPLANT
EXTRACTOR STONE 1.7FRX115CM (UROLOGICAL SUPPLIES) IMPLANT
GLOVE BIO SURGEON STRL SZ8 (GLOVE) IMPLANT
GLOVE BIOGEL PI IND STRL 8 (GLOVE) IMPLANT
GLOVE SURG LX STRL 7.5 STRW (GLOVE) ×1 IMPLANT
GOWN STRL REUS W/ TWL XL LVL3 (GOWN DISPOSABLE) ×1 IMPLANT
GOWN STRL REUS W/TWL XL LVL3 (GOWN DISPOSABLE) ×2
GUIDEWIRE ANG ZIPWIRE 038X150 (WIRE) IMPLANT
GUIDEWIRE STR DUAL SENSOR (WIRE) ×1 IMPLANT
LASER FIB FLEXIVA PULSE ID 365 (Laser) IMPLANT
MANIFOLD NEPTUNE II (INSTRUMENTS) ×1 IMPLANT
MAT HALF PREVALON HALF STRYKER (MISCELLANEOUS) IMPLANT
PACK CYSTO (CUSTOM PROCEDURE TRAY) ×1 IMPLANT
SHEATH NAVIGATOR HD 12/14X28 (SHEATH) IMPLANT
SHEATH NAVIGATOR HD 12/14X36 (SHEATH) IMPLANT
STENT URET 6FRX26 CONTOUR (STENTS) IMPLANT
TRACTIP FLEXIVA PULS ID 200XHI (Laser) IMPLANT
TRACTIP FLEXIVA PULSE ID 200 (Laser)
TUBING CONNECTING 10 (TUBING) ×1 IMPLANT
TUBING UROLOGY SET (TUBING) ×1 IMPLANT

## 2022-05-09 NOTE — ED Notes (Signed)
SBAR report given to [facility] ED charge RN, and [facility] at this time.

## 2022-05-09 NOTE — Progress Notes (Signed)
Patient underwent left ureteral stent placement.  He tolerated it well.  A foley catheter was placed at the end of the case.  This can be removed at any time once its deemed he is improving from his infection.  We will schedule him for f/u stone removal in the coming weeks.

## 2022-05-09 NOTE — Anesthesia Preprocedure Evaluation (Signed)
Anesthesia Evaluation  Patient identified by MRN, date of birth, ID band Patient awake    Reviewed: Allergy & Precautions, NPO status , Patient's Chart, lab work & pertinent test results, reviewed documented beta blocker date and time   History of Anesthesia Complications (+) PONV and history of anesthetic complications  Airway Mallampati: I  TM Distance: >3 FB Neck ROM: Full    Dental no notable dental hx. (+) Teeth Intact, Dental Advisory Given   Pulmonary asthma , sleep apnea (bi-pap)    Pulmonary exam normal breath sounds clear to auscultation       Cardiovascular hypertension, Pt. on home beta blockers and Pt. on medications + angina  +CHF  Normal cardiovascular exam Rhythm:Regular Rate:Normal  TTE 2015 - Left ventricle: The cavity size was normal. Wall thickness was    increased in a pattern of mild LVH. Systolic function was mildly    reduced. The estimated ejection fraction was in the range of 45%    to 50%. There is hypokinesis of the inferior myocardium.  - Mitral valve: There was mild regurgitation.  - Right ventricle: The cavity size was moderately dilated. Wall    thickness was normal. Systolic function was mildly reduced.   Cath 2023   Prox LAD to Mid LAD lesion is 85% stenosed.   1st Mrg lesion is 25% stenosed.   Prox RCA lesion is 25% stenosed.   A drug-eluting stent was successfully placed using a STENT ONYX FRONTIER [id number].   Post intervention, there is a 0% residual stenosis.   The left ventricular systolic function is normal.   LV end diastolic pressure is mildly elevated.   The left ventricular ejection fraction is 55-65% by visual estimate.   1. 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 The vessel is tortuous proximally. There is diffuse ectasia of the mid vessel 2. Normal LV function 3. Mildly elevated LVEDP 4. Successful PCI of the proximal LAD with DES.  Complicated by side  branch occlusion of small diagonal branch    Neuro/Psych  Headaches PSYCHIATRIC DISORDERS Anxiety Depression       GI/Hepatic negative GI ROS, Neg liver ROS,,,  Endo/Other    Morbid obesity (BMI 54)  Renal/GU Renal InsufficiencyRenal diseaseLab Results      Component                Value               Date                      CREATININE               1.39 (H)            05/09/2022                BUN                      21 (H)              05/09/2022                NA                       133 (L)             05/09/2022                K  3.8                 05/09/2022                CL                       95 (L)              05/09/2022                CO2                      21 (L)              05/09/2022             negative genitourinary   Musculoskeletal  (+) Arthritis ,    Abdominal   Peds  Hematology  (+) Blood dyscrasia (plavix) Lab Results      Component                Value               Date                      WBC                      6.6                 05/09/2022                HGB                      13.5                05/09/2022                HCT                      39.6                05/09/2022                MCV                      86.5                05/09/2022                PLT                      103 (L)             05/09/2022              Anesthesia Other Findings   Reproductive/Obstetrics                             Anesthesia Physical Anesthesia Plan  ASA: 3 and emergent  Anesthesia Plan: General   Post-op Pain Management:    Induction: Intravenous  PONV Risk Score and Plan: 3 and Ondansetron, Dexamethasone and Midazolam  Airway Management Planned: LMA  Additional Equipment:   Intra-op Plan:   Post-operative Plan: Extubation in OR  Informed Consent: I have reviewed the patients History and Physical, chart, labs and discussed the procedure including the risks, benefits and  alternatives for  the proposed anesthesia with the patient or authorized representative who has indicated his/her understanding and acceptance.     Dental advisory given  Plan Discussed with: CRNA  Anesthesia Plan Comments:        Anesthesia Quick Evaluation

## 2022-05-09 NOTE — ED Notes (Signed)
Pt. Belongings, paperwork sent to [facility] ED at this time via [facility]

## 2022-05-09 NOTE — ED Notes (Signed)
Patient transported to PACU/OR at this time.  Pain medication held prior to consent.  Will be given in PACU

## 2022-05-09 NOTE — ED Notes (Signed)
Patient arrived from [place] at this time.

## 2022-05-09 NOTE — Op Note (Addendum)
Preoperative diagnosis:  Right infected ureteral stone   Postoperative diagnosis:  same   Procedure:  Cystoscopy Right ureteral stent placement Right retrograde pyelography with interpretation   Surgeon: Ardis Hughs, MD  Anesthesia: General  Complications: None  Intraoperative findings:  right retrograde pyelography demonstrated a filling defect within the right ureter consistent with the patient's known calculus without other abnormalities.  EBL: Minimal  Specimens: None  Indication: [name] [name] is a 56 y.o. patient with urinary tract infection and a 56m right ureteral stone. After reviewing the management options for treatment, he elected to proceed with the above surgical procedure(s). We have discussed the potential benefits and risks of the procedure, side effects of the proposed treatment, the likelihood of the patient achieving the goals of the procedure, and any potential problems that might occur during the procedure or recuperation. Informed consent has been obtained.  Description of procedure:  The patient was taken to the operating room and general anesthesia was induced.  The patient was placed in the dorsal lithotomy position, prepped and draped in the usual sterile fashion, and preoperative antibiotics were administered. A preoperative time-out was performed.   Cystourethroscopy was performed.  The patient's urethra was examined and was normal demonstrated bilobar prostatic hypertrophy. The bladder was then systematically examined in its entirety. There was no evidence for any bladder tumors, stones, or other mucosal pathology.    Attention then turned to the right ureteral orifice and a ureteral catheter was used to intubate the ureteral orifice.  Omnipaque contrast was injected through the ureteral catheter and a retrograde pyelogram was performed with findings as dictated above.  A 0.38 sensor guidewire was then advanced up the right ureter into the  renal pelvis under fluoroscopic guidance.  The wire was then backloaded through the cystoscope and a ureteral stent was advance over the wire using Seldinger technique.  The stent was positioned appropriately under fluoroscopic and cystoscopic guidance.  The wire was then removed with an adequate stent curl noted in the renal pelvis as well as in the bladder.  The bladder was then emptied and the procedure ended.  The patient appeared to tolerate the procedure well and without complications.  The patient was able to be awakened and transferred to the recovery unit in satisfactory condition.    BArdis Hughs M.D.

## 2022-05-09 NOTE — Consult Note (Signed)
I have been asked to see the patient by Dr. Georgina Snell, for evaluation and management of left large UPJ stone and UTI.  History of present illness: 56 year old male with extensive comorbidities presented to the emergency department with left sided flank pain and fevers of 101.8 at home.  Urine analysis demonstrated concern for infection.  CT scan demonstrated a 13 mm left UPJ stone with proximal hydronephrosis.  The patient fortunately has been hemodynamically stable.  Urology was consulted for further management.  Review of systems: A 12 point comprehensive review of systems was obtained and is negative unless otherwise stated in the history of present illness.  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   Gout 12/15/2013   Clinical depression 12/15/2013   Chronic respiratory failure with hypercapnia ([place]) 12/07/2013   Morbid obesity ([place]) 12/07/2013   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[place]) 12/06/2013   Pituitary adenoma ([place]) 12/06/2013   Intrinsic asthma 10/06/2013   OSA (obstructive sleep apnea) 10/06/2013   Injury of kidney 08/07/2013   Acute on chronic diastolic heart failure ([place]) 08/07/2013   Disorder of peripheral nervous system 08/07/2013   Generalized OA 08/07/2013   Disuse syndrome 08/07/2013   Extreme obesity 08/02/2013   Chronic diastolic heart failure ([place]) 08/02/2013   Infected wound 07/28/2013   Elevated blood uric acid level 04/23/2013   Abdominal wall abscess 12/27/2012   Calculus of kidney  11/22/2012   Kidney cysts 11/22/2012   BP (high blood pressure) 05/21/2012   H/O infectious disease 12/04/2011    No current facility-administered medications on file prior to encounter.   Current Outpatient Medications on File Prior to Encounter  Medication Sig Dispense Refill   allopurinol (ZYLOPRIM) 300 MG tablet Take 300 mg by mouth daily.     ALPRAZolam (XANAX) 0.25 MG tablet Take 1 tablet (0.25 mg total) by mouth 3 (three) times daily as needed for anxiety or sleep (and QHS prn insomnia). 60 tablet 0   aspirin 81 MG chewable tablet Chew 81 mg by mouth daily.     buPROPion (WELLBUTRIN XL) 300 MG 24 hr tablet Take 300 mg by mouth daily.     carboxymethylcellulose (REFRESH PLUS) 0.5 % SOLN Place 2 drops into both eyes daily as needed (for dry eyes).     clopidogrel (PLAVIX) 75 MG tablet Take 1 tablet (75 mg total) by mouth daily with breakfast. 90 tablet 3   cyclobenzaprine (FLEXERIL) 10 MG tablet Take 10 mg by mouth 3 (three) times daily as needed for muscle spasms.     diclofenac sodium (VOLTAREN) 1 % GEL Apply 4 g topically daily as needed (pain).     Diclofenac-miSOPROStol 75-0.2 MG TBEC Take 1 tablet by mouth 2 (two) times daily.      hydrocortisone (CORTEF) 20 MG tablet Take 20 mg by mouth daily.     isosorbide mononitrate (IMDUR) 30 MG 24 hr tablet Take 1 tablet (30 mg total) by mouth daily. 90 tablet 3   levothyroxine (SYNTHROID) 125 MCG tablet Take 125 mcg by mouth daily before breakfast.     metoprolol tartrate (LOPRESSOR)  25 MG tablet Take 0.5 tablets (12.5 mg total) by mouth 2 (two) times daily. 30 tablet 6   modafinil (PROVIGIL) 100 MG tablet Take 100 mg by mouth daily as needed (take for drowsiness).     pantoprazole (PROTONIX) 40 MG tablet Take 40 mg by mouth daily.     pseudoephedrine (SUDAFED) 120 MG 12 hr tablet Take 120 mg by mouth every 12 (twelve) hours as needed for congestion.     rOPINIRole (REQUIP) 0.5 MG tablet Take 0.5 mg by mouth at bedtime as needed (RLS).      rosuvastatin (CRESTOR) 20 MG tablet TAKE 1 TABLET DAILY 90 tablet 3   Semaglutide, 1 MG/DOSE, (OZEMPIC, 1 MG/DOSE,) 4 MG/3ML SOPN Inject 1 mg into the skin every Sunday.     sertraline (ZOLOFT) 100 MG tablet Take 150 mg by mouth daily.     traMADol (ULTRAM) 50 MG tablet Take 50-100 mg by mouth at bedtime as needed for moderate pain.     zolpidem (AMBIEN) 10 MG tablet Take 1 tablet (10 mg total) by mouth at bedtime as needed for sleep. (Patient taking differently: Take 10 mg by mouth at bedtime.) 30 tablet 5    Past Medical History:  Diagnosis Date   Allergic rhinitis    Anxiety    Arthritis    Depression    Diastolic CHF ([place])    Gout    H/O acute respiratory failure    Hypertension    Morbid obesity ([place])    Nephrolithiasis    Peripheral neuropathy    Pituitary tumor    PONV (postoperative nausea and vomiting)    Sleep apnea    uses bi-pap    Past Surgical History:  Procedure Laterality Date   ABDOMINAL SURGERY  06/2013   for cyst with MRSA   ACHILLES TENDON REPAIR Bilateral    APPLICATION OF A-CELL OF CHEST/ABDOMEN N/A 01/26/2014   Procedure: PLACEMENT OF A CELL AND VAC ;  Surgeon: [name], DO;  Location: [facility];  Service: Plastics;  Laterality: N/A;   APPLICATION OF A-CELL OF CHEST/ABDOMEN N/A 02/02/2014   Procedure: WITH PLACEMENT OF A CELL AND VAC ;  Surgeon: [name], DO;  Location: [facility];  Service: Plastics;  Laterality: N/A;   BRAIN SURGERY     COLON RESECTION     CORONARY STENT INTERVENTION N/A 07/28/2021   Procedure: CORONARY STENT INTERVENTION;  Surgeon: Martinique, Peter M, MD;  Location: [facility];  Service: Cardiovascular;  Laterality: N/A;   CRANIOTOMY N/A 12/10/2013   Procedure: Transpheniodal resection of Pituitary tumor with Dr. Radene Journey for approach;  Surgeon: Floyce Stakes, MD;  Location[facility];  Service: Neurosurgery;  Laterality: N/A;  Transpheniodal resection of Pituitary tumor with Dr. Radene Journey for approach   HERNIA REPAIR      INCISION AND DRAINAGE OF WOUND N/A 01/26/2014   Procedure: IRRIGATION AND DEBRIDEMENT ABDOMINAL WOUND WITH ;  Surgeon: [name], DO;  Location: [facility];  Service: Plastics;  Laterality: N/A;   INCISION AND DRAINAGE OF WOUND N/A 02/02/2014   Procedure: IRRIGATION AND DEBRIDEMENT ABDOMINAL WOUND;  Surgeon: [name], DO;  Location: [facility];  Service: Plastics;  Laterality: N/A;   INCISION AND DRAINAGE OF WOUND N/A 02/12/2014   Procedure: IRRIGATION AND DEBRIDEMENT OF ABDOMINAL WOUND/PLACEMENT OF A-CELL AND VAC;  Surgeon: [name], DO;  Location: [facility];  Service: Plastics;  Laterality: N/A;   INCISION AND DRAINAGE OF WOUND N/A 04/01/2014   Procedure: IRRIGATION AND DEBRIDEMENT  ABDOMINAL WOUND WITH PLACEMENT OF ACELL/VAC;  Surgeon: [name], DO;  Location: [facility];  Service: Plastics;  Laterality: N/A;   LEFT HEART CATH AND CORONARY ANGIOGRAPHY N/A 07/28/2021   Procedure: LEFT HEART CATH AND CORONARY ANGIOGRAPHY;  Surgeon: Martinique, Peter M, MD;  Location: [facility];  Service: Cardiovascular;  Laterality: N/A;   Sigmoid Colonoscopy     TRANSNASAL APPROACH N/A 12/10/2013   Procedure: TRANSNASAL APPROACH;  Surgeon: Rozetta Nunnery, MD;  Location: [facility];  Service: ENT;  Laterality: N/A;    Social History   Tobacco Use   Smoking status: Never   Smokeless tobacco: Never  Vaping Use   Vaping Use: Never used  Substance Use Topics   Alcohol use: No   Drug use: No    Family History  Problem Relation Age of Onset   Hypertension Mother    Hypertension Father    Gout Father    Hypothyroidism Father    Gout Brother    Cancer Maternal Aunt        colon   Cancer Maternal Grandmother        breast    PE: Vitals:   05/09/22 2100 05/09/22 2227 05/09/22 2230 05/09/22 2251  BP:  124/86 118/76 114/72  Pulse: 77 70 72 72  Resp:  20  12  Temp:  97.7 F (36.5 C)  98.4 F (36.9 C)  TempSrc:  Oral    SpO2: 100% 99% 98% 100%  Weight:      Height:       Patient  appears to be in mild to moderate distress, morbidly obese patient is alert and oriented x3 Atraumatic normocephalic head No cervical or supraclavicular lymphadenopathy appreciated No increased work of breathing, no audible wheezes/rhonchi Regular sinus rhythm/rate Abdomen is soft, nontender, nondistended, left-sided CVA tenderness Lower extremities are symmetric without appreciable edema Grossly neurologically intact No identifiable skin lesions  Recent Labs    05/08/22 1839 05/08/22 1858 05/09/22 1723  WBC 9.6  --  6.6  HGB 13.2 12.9* 13.5  HCT 37.2* 38.0* 39.6   Recent Labs    05/08/22 1839 05/08/22 1858 05/09/22 1723  NA 131* 133* 133*  K 3.4* 3.5 3.8  CL 97* 97* 95*  CO2 21*  --  21*  GLUCOSE 100* 99 98  BUN 14 13 21*  CREATININE 1.51* 1.30* 1.39*  CALCIUM 8.2*  --  8.7*   No results for input(s): "LABPT", "INR" in the last 72 hours. No results for input(s): "LABURIN" in the last 72 hours. Results for orders placed or performed during the hospital encounter of 04/01/14  Surgical pcr screen     Status: None   Collection Time: 04/01/14 11:35 AM   Specimen: Nasal Mucosa; Nasal Swab  Result Value Ref Range Status   MRSA, PCR NEGATIVE NEGATIVE Final   Staphylococcus aureus NEGATIVE NEGATIVE Final    Comment:        The Xpert SA Assay (FDA approved for NASAL specimens in patients over 94 years of age), is one component of a comprehensive surveillance program.  Test performance has been validated by [employer] for patients greater than or equal to 50 year old. It is not intended to diagnose infection nor to guide or monitor treatment.    Imaging: I have a independently reviewed the patient's CT scan, which demonstrates a large left UPJ stone and bilateral nonobstructing stones.  Imp: The patient has an obstructing left proximal ureteral stone, UTI, and a host of other  medical comorbidities.  Recommendations: Given the above, I have recommended that the  patient proceed with a left ureteral stent urgently.  He will then be admitted to internal medicine for management of his comorbidities and infection, we will subsequently schedule him for follow-up for stone removal once he has been optimized and is fever has cleared.  Ardis Hughs

## 2022-05-09 NOTE — ED Notes (Signed)
Pt left with his mother and father. Pt advised to stay to be seen. Pt did not want to wait any longer stating they were told it would only be 6 hrs. Pt advised to return for any new or worsening symptoms. EDP made aware.

## 2022-05-09 NOTE — ED Triage Notes (Signed)
Patient arrives with complaints of vomiting and suspected UTI x4 days. Patient reports malodorous urine smell, with burning and dark color.   Reports that he feels dehydrated as well.  Generalized body pain, 8/10.  Went to [facility], but left due to the wait times.

## 2022-05-09 NOTE — ED Provider Notes (Signed)
10:12 PM Arrived from [facility] for urology eval for likely stent placement.  He does have significant recurrence of pain and now having nausea.  Will order dose of medication and notify urology of his arrival.  10:26 PM Spoke with Dr. Louis Meckel-- he is taking patient to the OR shortly.  Will need triad to admit given his other medical comorbidities.  Discussed with hospitalist, Dr. Claria Dice-- will admit for ongoing care.   Larene Pickett, PA-C 05/10/22 [id number]    Carmin Muskrat, MD 05/10/22 [phone]

## 2022-05-09 NOTE — ED Provider Notes (Signed)
[facility] Provider Note   CSN: [id number] Arrival date & time: 05/09/22  1700     History  Chief Complaint  Patient presents with   Emesis   Urinary Tract Infection    [name] [name] is a 56 y.o. male.  With PMH of diastolic CHF, pituitary tumor, morbid obesity, HTN, CAD on Plavix, nephrolithiasis who presents with abdominal pain, vomiting and urinary symptoms worsening over the past 4 days.  Patient has had history of kidney stones and notes passing a large 1 pretty recently and takes Flomax daily however he is here mainly because of it over the past 4 to 5 days he has been having generalized fatigue and weakness with vomiting, fevers and burning urine with foul smell.  He has been having decreased p.o. intake.  He also notes ongoing pain in his right flank radiating down to his suprapubic region over the past few months but has been worsening over the past couple of days with the symptoms.  He is currently not on any antibiotics.  He feels like he is fully emptying his bladder.  He has had stents placed in the past many years ago and that was in a different state.   Emesis Urinary Tract Infection Associated symptoms: vomiting        Home Medications Prior to Admission medications   Medication Sig Start Date End Date Taking? Authorizing Provider  allopurinol (ZYLOPRIM) 300 MG tablet Take 300 mg by mouth daily.    [provider]  ALPRAZolam (XANAX) 0.25 MG tablet Take 1 tablet (0.25 mg total) by mouth 3 (three) times daily as needed for anxiety or sleep (and QHS prn insomnia). 11/03/15   Hayden Pedro, PA-C  aspirin 81 MG chewable tablet Chew 81 mg by mouth daily. 06/13/21   [provider]  buPROPion (WELLBUTRIN XL) 300 MG 24 hr tablet Take 300 mg by mouth daily.    [provider]  carboxymethylcellulose (REFRESH PLUS) 0.5 % SOLN Place 2 drops into both eyes daily as needed (for dry eyes).    [provider]  clopidogrel (PLAVIX) 75 MG tablet Take 1 tablet (75 mg total) by mouth daily with breakfast. 07/30/21   Bhagat, Crista Luria, PA  cyclobenzaprine (FLEXERIL) 10 MG tablet Take 10 mg by mouth 3 (three) times daily as needed for muscle spasms.    [provider]  diclofenac sodium (VOLTAREN) 1 % GEL Apply 4 g topically daily as needed (pain).    [provider]  Diclofenac-miSOPROStol 75-0.2 MG TBEC Take 1 tablet by mouth 2 (two) times daily.     [provider]  hydrocortisone (CORTEF) 20 MG tablet Take 20 mg by mouth daily.    [provider]  isosorbide mononitrate (IMDUR) 30 MG 24 hr tablet Take 1 tablet (30 mg total) by mouth daily. 07/30/21   Leanor Kail, PA  levothyroxine (SYNTHROID) 125 MCG tablet Take 125 mcg by mouth daily before breakfast. 05/02/21   [provider]  metoprolol tartrate (LOPRESSOR) 25 MG tablet Take 0.5 tablets (12.5 mg total) by mouth 2 (two) times daily. 07/29/21   Bhagat, Crista Luria, PA  modafinil (PROVIGIL) 100 MG tablet Take 100 mg by mouth daily as needed (take for drowsiness). 01/08/20   [provider]  pantoprazole (PROTONIX) 40 MG tablet Take 40 mg by mouth daily. 01/17/21   [provider]  pseudoephedrine (SUDAFED) 120 MG 12 hr tablet Take 120 mg by mouth every 12 (twelve) hours as needed for congestion.  [provider]  rOPINIRole (REQUIP) 0.5 MG tablet Take 0.5 mg by mouth at bedtime as needed (RLS). 01/08/20   [provider]  rosuvastatin (CRESTOR) 20 MG tablet TAKE 1 TABLET DAILY 12/19/21   Croitoru, Mihai, MD  Semaglutide, 1 MG/DOSE, (OZEMPIC, 1 MG/DOSE,) 4 MG/3ML SOPN Inject 1 mg into the skin every Sunday.    [provider]  sertraline (ZOLOFT) 100 MG tablet Take 150 mg by mouth daily.    [provider]  traMADol (ULTRAM) 50 MG tablet Take 50-100 mg by mouth at bedtime as needed for moderate pain.    [provider]  zolpidem (AMBIEN) 10 MG  tablet Take 1 tablet (10 mg total) by mouth at bedtime as needed for sleep. Patient taking differently: Take 10 mg by mouth at bedtime. 05/15/16   Tyler Pita, MD      Allergies    Morphine and Penicillins    Review of Systems   Review of Systems  Gastrointestinal:  Positive for vomiting.    Physical Exam Updated Vital Signs BP 113/64   Pulse 78   Temp 99.9 F (37.7 C) (Oral)   Resp 18   Ht 6' (1.829 m)   Wt (!) 180 kg   SpO2 99%   BMI 53.82 kg/m  Physical Exam Constitutional: Alert and oriented.  Fatigued and chronically ill-appearing Eyes: Conjunctivae are normal. ENT      Head: Normocephalic and atraumatic.      Nose: No congestion.      Mouth/Throat: Mucous membranes are dry.      Neck: No stridor. Cardiovascular: S1, S2,  Normal and symmetric distal pulses are present in all extremities.Warm and well perfused. Respiratory: Normal respiratory effort.  O2 sat 94 on RA gastrointestinal: Soft and nondistended with right CVA tenderness and suprapubic tenderness no rebound or guarding Musculoskeletal: Normal range of motion in all extremities. No pitting edema of lower extremities Neurologic: Normal speech and language. No gross focal neurologic deficits are appreciated. Skin: Skin is warm, dry and intact. No rash noted. Psychiatric: Mood and affect are normal. Speech and behavior are normal.  ED Results / Procedures / Treatments   Labs (all labs ordered are listed, but only abnormal results are displayed) Labs Reviewed  LIPASE, BLOOD - Abnormal; Notable for the following components:      Result Value   Lipase <10 (*)    All other components within normal limits  COMPREHENSIVE METABOLIC PANEL - Abnormal; Notable for the following components:   Sodium 133 (*)    Chloride 95 (*)    CO2 21 (*)    BUN 21 (*)    Creatinine, Ser 1.39 (*)    Calcium 8.7 (*)    AST 49 (*)    GFR, Estimated 59 (*)    Anion gap 17 (*)    All other components within normal limits   CBC - Abnormal; Notable for the following components:   Platelets 103 (*)    All other components within normal limits  URINALYSIS, ROUTINE W REFLEX MICROSCOPIC - Abnormal; Notable for the following components:   APPearance CLOUDY (*)    Hgb urine dipstick MODERATE (*)    Ketones, ur 15 (*)    Protein, ur 100 (*)    Nitrite POSITIVE (*)    Leukocytes,Ua LARGE (*)    WBC, UA >50 (*)    Bacteria, UA RARE (*)    All other components within normal limits    EKG None  Radiology CT Renal Stone  Study  Result Date: 05/09/2022 CLINICAL DATA:  Abdominal pain.  Concern for kidney stone. EXAM: CT ABDOMEN AND PELVIS WITHOUT CONTRAST TECHNIQUE: Multidetector CT imaging of the abdomen and pelvis was performed following the standard protocol without IV contrast. RADIATION DOSE REDUCTION: This exam was performed according to the departmental dose-optimization program which includes automated exposure control, adjustment of the mA and/or kV according to patient size and/or use of iterative reconstruction technique. COMPARISON:  CT abdomen pelvis dated 05/08/2022. FINDINGS: Evaluation of this exam is limited in the absence of intravenous contrast. Lower chest: The visualized lung bases are clear. Advanced coronary vascular calcification of the LAD. No intra-abdominal free air or free fluid. Hepatobiliary: Fatty liver. No biliary dilatation. Probable noncalcified gallstone in the proximal gallbladder versus a tumefactive sludge. No pericholecystic fluid or evidence of acute cholecystitis by CT. Pancreas: Unremarkable. No pancreatic ductal dilatation or surrounding inflammatory changes. Spleen: Splenomegaly measuring 18 cm in craniocaudal length. Adrenals/Urinary Tract: The adrenal glands are unremarkable. Similar positioning of a 13 mm obstructing stone at the right UPJ. There is mild right hydronephrosis. Several additional nonobstructing bilateral renal calculi measure up to 11 mm in the inferior pole of the  left kidney. There is no hydronephrosis on the left. Small right renal interpolar cyst. Additional subcentimeter right renal inferior pole hypodense lesion which is too small to characterize. No imaging follow-up. Mild left perinephric stranding, nonspecific and may be related to recently passed stone. Correlation with urinalysis recommended to exclude UTI. The visualized ureters and urinary bladder appear unremarkable. Stomach/Bowel: Postsurgical changes of the rectosigmoid with anastomotic suture. Several small scattered sigmoid diverticula without active inflammatory changes. There is a small hiatal hernia. There is no bowel obstruction or active inflammation. The appendix is normal. Vascular/Lymphatic: Mild aortoiliac atherosclerotic disease. The IVC is unremarkable. No portal venous gas. There is no adenopathy. Reproductive: The prostate gland is small. Other: Small fat containing left inguinal hernia. Partially visualized skin thickening and induration of the subcutaneous soft tissues of the right side of the pannus may represent cellulitis. No drainable fluid collection/abscess identified. Fatty atrophy of the distal right rectus muscle. Musculoskeletal: Degenerative changes of the spine. L4-L5 endplate irregularity and disc space narrowing, likely degenerative. An infectious process is less likely but not excluded clinical correlation is recommended. No acute osseous pathology. Old right rib fractures. IMPRESSION: 1. Similar positioning of a 13 mm obstructing stone at the right UPJ with mild right hydronephrosis. Several additional nonobstructing bilateral renal calculi. 2. Mild left perinephric stranding, nonspecific and may be related to recently passed stone. Correlation with urinalysis recommended to exclude UTI. 3. Fatty liver. 4. Splenomegaly. 5. Colonic diverticulosis. No bowel obstruction. Normal appendix. 6.  Aortic Atherosclerosis (ICD10-I70.0). Electronically Signed   By: Anner Crete M.D.    On: 05/09/2022 19:33   DG Chest 2 View  Result Date: 05/08/2022 CLINICAL DATA:  Chest pain EXAM: CHEST - 2 VIEW COMPARISON:  Chest x-ray 12/11/2013 FINDINGS: The heart size and mediastinal contours are within normal limits. Both lungs are clear. There are healed bilateral rib fractures, unchanged. IMPRESSION: No active cardiopulmonary disease. Electronically Signed   By: Ronney Asters M.D.   On: 05/08/2022 19:59   CT RENAL STONE STUDY  Result Date: 05/08/2022 CLINICAL DATA:  Patient reports abdominal pain for 3 days after passing kidney stone. EXAM: CT ABDOMEN AND PELVIS WITHOUT CONTRAST TECHNIQUE: Multidetector CT imaging of the abdomen and pelvis was performed following the standard protocol without IV contrast. RADIATION DOSE REDUCTION: This exam was performed according to  the departmental dose-optimization program which includes automated exposure control, adjustment of the mA and/or kV according to patient size and/or use of iterative reconstruction technique. COMPARISON:  CT 03/24/2014 FINDINGS: Lower chest: Chronic elevation of right hemidiaphragm. 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 No acute airspace disease or pleural effusion. Hepatobiliary: Diffusely decreased hepatic density typical of steatosis. The liver is enlarged spanning 19.5 cm cranial caudal. No evidence of focal hepatic abnormality. Mild gallbladder distension. Suspected noncalcified intraluminal gallstones. No pericholecystic inflammation. No biliary dilatation. Pancreas: Mild fatty atrophy.  No ductal dilatation or inflammation. Spleen: Enlarged spanning 17.8 cm cranial caudal. No focal abnormalities on this unenhanced exam. Adrenals/Urinary Tract: No adrenal nodules. There is a 13 mm stone at/just beyond the right ureteropelvic junction with mild hydronephrosis. Trace right perinephric edema. At least 4 nonobstructing intrarenal calculi in the right kidney. Ureter is decompressed. Simple cyst  posteriorly in the mid right kidney. No imaging follow-up is needed. There is left perinephric edema but no hydronephrosis. The left ureter is decompressed. There are 2 nonobstructing intrarenal calculi on the left, larger in the lower pole measures 12 mm. Mild to moderate left perinephric edema. No evidence of suspicious renal lesion. The urinary bladder is only minimally distended, no bladder wall thickening or stone. Stomach/Bowel: Small hiatal hernia. Otherwise unremarkable stomach. No small bowel obstruction or inflammation. Normal appendix visualized. Fluid/liquid stool in the proximal colon. Small volume of formed stool in the distal colon. Mild sigmoid diverticulosis without diverticulitis. Sigmoid enteric sutures. Vascular/Lymphatic: Mild aortic atherosclerosis. No aneurysm. There are few prominent right external iliac and inguinal nodes are not enlarged by size criteria. No suspicious adenopathy. Reproductive: Diminutive prostate. Other: There is diffuse skin thickening and subcutaneous edema of the right lower abdominal pannus. This is incompletely included in the field of view due to habitus. No soft tissue gas. No definite subcutaneous fluid collection. Postsurgical change of the anterior abdominal wall. No intra-abdominal ascites or free air Musculoskeletal: L4-L5 disc space loss with endplate irregularity and sclerosis. Bilateral hip osteoarthritis. IMPRESSION: 1. Obstructing 13 mm stone at/just beyond the right ureteropelvic junction with mild hydronephrosis. 2. Bilateral nonobstructing intrarenal calculi. 3. Left perinephric edema which may indicate recently passed stone. No left hydronephrosis. 4. Hepatosplenomegaly and hepatic steatosis. 5. Skin thickening and subcutaneous edema of the right lower abdominal pannus, incompletely included in the field of view due to habitus. Recommend correlation for cellulitis. 6. Suspected noncalcified gallstones. 7. Mild sigmoid diverticulosis without  diverticulitis. 8. L4-L5 disc space loss with endplate irregularity and sclerosis. This may be degenerative, however if there is clinical concern for discitis/osteomyelitis, recommend further evaluation with lumbar spine MRI. Aortic Atherosclerosis (ICD10-I70.0). Electronically Signed   By: Keith Rake M.D.   On: 05/08/2022 19:11    Procedures Procedures    Medications Ordered in ED Medications  ciprofloxacin (CIPRO) IVPB 400 mg (400 mg Intravenous New Bag/Given 05/09/22 1932)  lactated ringers bolus 1,000 mL (1,000 mLs Intravenous New Bag/Given 05/09/22 1930)  ondansetron (ZOFRAN) injection 4 mg (4 mg Intravenous Given 05/09/22 1931)  acetaminophen (TYLENOL) tablet 1,000 mg (1,000 mg Oral Given 05/09/22 1913)    ED Course/ Medical Decision Making/ A&P Clinical Course as of 05/09/22 2050  Tue May 09, 2022  2036 Spoke with Dr Louis Meckel from urology requesting ED to ED transfer to [facility] long as he likely require stent placed tonight.  Will keep patient NPO. [VB]    Clinical Course User Index [VB] Elgie Congo, MD  Medical Decision Making [name] [name] is a 56 y.o. male.  With PMH of diastolic CHF, pituitary tumor, morbid obesity, HTN, CAD on Plavix, nephrolithiasis who presents with abdominal pain, vomiting and urinary symptoms worsening over the past 4 days.    Regarding the patient's flank pain, differential includes but is not limited to nephrolithiasis, pyelonephritis, MSK pain, referred pain from cholelithiasis. Less likely pulmonary source such as pneumonia or PE with no cardiopulmonary complaints, no hypoxia, and no increased work of breathing.  Patient's UA obtained concerning for infection with positive nitrate large leukocyte esterase and greater than 50 WBCs and 21-50 RBCs.  He has a white blood cell count 6.6, he is borderline febrile but hemodynamically stable, unlikely sepsis.  Creatinine 1.39 consistent with baseline.  CT renal stone  obtained which I personally reviewed concerning for a 13 mm obstructing stone at right UPJ with associated mild hydronephrosis as well as several nonobstructing bilateral renal calculi.  He also had left perinephric stranding secondary to likely recently passed stone.  His workup is concerning for superimposed infectious obstructing stone and associated pyelonephritis.  Consulted urology Spoke with Dr Louis Meckel from urology requesting ED to ED transfer to [facility] long as he likely require stent placed tonight.  Will keep patient NPO.   Patient accepted by Dr. Armandina Gemma for ED to ED transfer over to [facility].  He will need urology reconsulted upon arrival.  He will need hospitalist consulted for admission for continued IV fluids antibiotics and symptom control.   Amount and/or Complexity of Data Reviewed Labs: ordered. Radiology: ordered.  Risk OTC drugs. Prescription drug management.   Final Clinical Impression(s) / ED Diagnoses Final diagnoses:  Pyelonephritis  Kidney stone    Rx / DC Orders ED Discharge Orders     None         Elgie Congo, MD 05/09/22 2050

## 2022-05-09 NOTE — ED Notes (Signed)
Employee from radiology approached me and advised that [name] needed to use the restroom, once I got to him he had already soiled himself. Cleaned the pt, provided peri care and placed new sheets on the bed. Pt's mom is by the stretcher and stated that she was calling his dad to come and pick them up to leave.

## 2022-05-10 ENCOUNTER — Other Ambulatory Visit: Payer: Self-pay

## 2022-05-10 ENCOUNTER — Encounter (HOSPITAL_COMMUNITY): Payer: Self-pay | Admitting: Urology

## 2022-05-10 DIAGNOSIS — R5381 Other malaise: Secondary | ICD-10-CM

## 2022-05-10 DIAGNOSIS — E876 Hypokalemia: Secondary | ICD-10-CM | POA: Diagnosis not present

## 2022-05-10 DIAGNOSIS — Z880 Allergy status to penicillin: Secondary | ICD-10-CM | POA: Diagnosis not present

## 2022-05-10 DIAGNOSIS — Z7952 Long term (current) use of systemic steroids: Secondary | ICD-10-CM | POA: Diagnosis not present

## 2022-05-10 DIAGNOSIS — I110 Hypertensive heart disease with heart failure: Secondary | ICD-10-CM | POA: Diagnosis present

## 2022-05-10 DIAGNOSIS — J9612 Chronic respiratory failure with hypercapnia: Secondary | ICD-10-CM

## 2022-05-10 DIAGNOSIS — E662 Morbid (severe) obesity with alveolar hypoventilation: Secondary | ICD-10-CM | POA: Diagnosis present

## 2022-05-10 DIAGNOSIS — E782 Mixed hyperlipidemia: Secondary | ICD-10-CM | POA: Diagnosis not present

## 2022-05-10 DIAGNOSIS — D352 Benign neoplasm of pituitary gland: Secondary | ICD-10-CM | POA: Diagnosis not present

## 2022-05-10 DIAGNOSIS — Z800 Family history of malignant neoplasm of digestive organs: Secondary | ICD-10-CM | POA: Diagnosis not present

## 2022-05-10 DIAGNOSIS — N400 Enlarged prostate without lower urinary tract symptoms: Secondary | ICD-10-CM | POA: Diagnosis present

## 2022-05-10 DIAGNOSIS — Z6841 Body Mass Index (BMI) 40.0 and over, adult: Secondary | ICD-10-CM | POA: Diagnosis not present

## 2022-05-10 DIAGNOSIS — A419 Sepsis, unspecified organism: Secondary | ICD-10-CM | POA: Diagnosis present

## 2022-05-10 DIAGNOSIS — N10 Acute tubulo-interstitial nephritis: Secondary | ICD-10-CM | POA: Diagnosis not present

## 2022-05-10 DIAGNOSIS — N201 Calculus of ureter: Secondary | ICD-10-CM | POA: Diagnosis not present

## 2022-05-10 DIAGNOSIS — Z7902 Long term (current) use of antithrombotics/antiplatelets: Secondary | ICD-10-CM | POA: Diagnosis not present

## 2022-05-10 DIAGNOSIS — N200 Calculus of kidney: Secondary | ICD-10-CM

## 2022-05-10 DIAGNOSIS — N12 Tubulo-interstitial nephritis, not specified as acute or chronic: Secondary | ICD-10-CM | POA: Diagnosis not present

## 2022-05-10 DIAGNOSIS — E039 Hypothyroidism, unspecified: Secondary | ICD-10-CM | POA: Diagnosis present

## 2022-05-10 DIAGNOSIS — Z1152 Encounter for screening for COVID-19: Secondary | ICD-10-CM | POA: Diagnosis not present

## 2022-05-10 DIAGNOSIS — I2510 Atherosclerotic heart disease of native coronary artery without angina pectoris: Secondary | ICD-10-CM | POA: Diagnosis not present

## 2022-05-10 DIAGNOSIS — G4733 Obstructive sleep apnea (adult) (pediatric): Secondary | ICD-10-CM | POA: Diagnosis not present

## 2022-05-10 DIAGNOSIS — I5032 Chronic diastolic (congestive) heart failure: Secondary | ICD-10-CM

## 2022-05-10 DIAGNOSIS — E230 Hypopituitarism: Secondary | ICD-10-CM

## 2022-05-10 DIAGNOSIS — N136 Pyonephrosis: Secondary | ICD-10-CM | POA: Diagnosis present

## 2022-05-10 DIAGNOSIS — Z803 Family history of malignant neoplasm of breast: Secondary | ICD-10-CM | POA: Diagnosis not present

## 2022-05-10 DIAGNOSIS — N211 Calculus in urethra: Secondary | ICD-10-CM | POA: Diagnosis present

## 2022-05-10 DIAGNOSIS — M109 Gout, unspecified: Secondary | ICD-10-CM | POA: Diagnosis present

## 2022-05-10 DIAGNOSIS — E893 Postprocedural hypopituitarism: Secondary | ICD-10-CM | POA: Diagnosis present

## 2022-05-10 DIAGNOSIS — Z20822 Contact with and (suspected) exposure to covid-19: Secondary | ICD-10-CM | POA: Diagnosis present

## 2022-05-10 DIAGNOSIS — R197 Diarrhea, unspecified: Secondary | ICD-10-CM | POA: Diagnosis not present

## 2022-05-10 DIAGNOSIS — F419 Anxiety disorder, unspecified: Secondary | ICD-10-CM | POA: Diagnosis present

## 2022-05-10 DIAGNOSIS — F32A Depression, unspecified: Secondary | ICD-10-CM | POA: Diagnosis present

## 2022-05-10 LAB — BASIC METABOLIC PANEL
Anion gap: 8 (ref 5–15)
BUN: 17 mg/dL (ref 6–20)
CO2: 25 mmol/L (ref 22–32)
Calcium: 8.1 mg/dL — ABNORMAL LOW (ref 8.9–10.3)
Chloride: 101 mmol/L (ref 98–111)
Creatinine, Ser: 1.09 mg/dL (ref 0.61–1.24)
GFR, Estimated: >60 mL/min (ref >60)
Glucose, Bld: 157 mg/dL — ABNORMAL HIGH (ref 70–99)
Potassium: 3.9 mmol/L (ref 3.5–5.1)
Sodium: 134 mmol/L — ABNORMAL LOW (ref 135–145)

## 2022-05-10 LAB — GLUCOSE, CAPILLARY
Glucose-Capillary: 105 mg/dL — ABNORMAL HIGH (ref 70–99)
Glucose-Capillary: 154 mg/dL — ABNORMAL HIGH (ref 70–99)
Glucose-Capillary: 142 mg/dL — ABNORMAL HIGH (ref 70–99)
Glucose-Capillary: 131 mg/dL — ABNORMAL HIGH (ref 70–99)
Glucose-Capillary: 133 mg/dL — ABNORMAL HIGH (ref 70–99)

## 2022-05-10 LAB — CBC WITH DIFFERENTIAL/PLATELET
Abs Immature Granulocytes: 0.03 10*3/uL (ref 0.00–0.07)
Basophils Absolute: 0 10*3/uL (ref 0.0–0.1)
Basophils Relative: 0 %
Eosinophils Absolute: 0 10*3/uL (ref 0.0–0.5)
Eosinophils Relative: 0 %
HCT: 37.4 % — ABNORMAL LOW (ref 39.0–52.0)
Hemoglobin: 12.5 g/dL — ABNORMAL LOW (ref 13.0–17.0)
Immature Granulocytes: 1 %
Lymphocytes Relative: 8 %
Lymphs Abs: 0.3 10*3/uL — ABNORMAL LOW (ref 0.7–4.0)
MCH: 30 pg (ref 26.0–34.0)
MCHC: 33.4 g/dL (ref 30.0–36.0)
MCV: 89.7 fL (ref 80.0–100.0)
Monocytes Absolute: 0.1 10*3/uL (ref 0.1–1.0)
Monocytes Relative: 3 %
Neutro Abs: 3.1 10*3/uL (ref 1.7–7.7)
Neutrophils Relative %: 88 %
Platelets: 89 10*3/uL — ABNORMAL LOW (ref 150–400)
RBC: 4.17 MIL/uL — ABNORMAL LOW (ref 4.22–5.81)
RDW: 12.9 % (ref 11.5–15.5)
WBC: 3.5 10*3/uL — ABNORMAL LOW (ref 4.0–10.5)
nRBC: 0 % (ref 0.0–0.2)

## 2022-05-10 LAB — HEMOGLOBIN A1C
Hgb A1c MFr Bld: 5.1 % (ref 4.8–5.6)
Mean Plasma Glucose: 100 mg/dL

## 2022-05-10 LAB — HIV ANTIBODY (ROUTINE TESTING W REFLEX): HIV Screen 4th Generation wRfx: NONREACTIVE

## 2022-05-10 MED ORDER — HYDROMORPHONE HCL 1 MG/ML IJ SOLN
1.0000 mg | INTRAMUSCULAR | Status: DC | PRN
  Administered 2022-05-10 (×2): 1 mg via INTRAVENOUS
  Filled 2022-05-10 (×2): qty 1

## 2022-05-10 MED ORDER — LIDOCAINE 2% (20 MG/ML) 5 ML SYRINGE
INTRAMUSCULAR | Status: DC | PRN
  Administered 2022-05-09: 80 mg via INTRAVENOUS

## 2022-05-10 MED ORDER — CIPROFLOXACIN IN D5W 400 MG/200ML IV SOLN
400.0000 mg | Freq: Two times a day (BID) | INTRAVENOUS | Status: DC
  Administered 2022-05-10 – 2022-05-13 (×7): 400 mg via INTRAVENOUS
  Filled 2022-05-10 (×7): qty 200

## 2022-05-10 MED ORDER — SODIUM CHLORIDE 0.9 % IR SOLN
Status: DC | PRN
  Administered 2022-05-09: 1000 mL

## 2022-05-10 MED ORDER — ROSUVASTATIN CALCIUM 20 MG PO TABS
20.0000 mg | ORAL_TABLET | Freq: Every day | ORAL | Status: DC
  Administered 2022-05-10 – 2022-05-17 (×8): 20 mg via ORAL
  Filled 2022-05-10 (×8): qty 1

## 2022-05-10 MED ORDER — FENTANYL CITRATE PF 50 MCG/ML IJ SOSY
PREFILLED_SYRINGE | INTRAMUSCULAR | Status: AC
  Filled 2022-05-10: qty 2

## 2022-05-10 MED ORDER — ALLOPURINOL 300 MG PO TABS
300.0000 mg | ORAL_TABLET | Freq: Every day | ORAL | Status: DC
  Administered 2022-05-10 – 2022-05-17 (×8): 300 mg via ORAL
  Filled 2022-05-10 (×8): qty 1

## 2022-05-10 MED ORDER — BUPROPION HCL ER (XL) 300 MG PO TB24
300.0000 mg | ORAL_TABLET | Freq: Every day | ORAL | Status: DC
  Administered 2022-05-10 – 2022-05-17 (×8): 300 mg via ORAL
  Filled 2022-05-10 (×8): qty 1

## 2022-05-10 MED ORDER — PHENYLEPHRINE HCL-NACL 20-0.9 MG/250ML-% IV SOLN
INTRAVENOUS | Status: DC | PRN
  Administered 2022-05-09: 25 ug/min via INTRAVENOUS

## 2022-05-10 MED ORDER — MISOPROSTOL 200 MCG PO TABS
200.0000 ug | ORAL_TABLET | Freq: Two times a day (BID) | ORAL | Status: DC
  Administered 2022-05-10 – 2022-05-17 (×15): 200 ug via ORAL
  Filled 2022-05-10 (×15): qty 1

## 2022-05-10 MED ORDER — ALPRAZOLAM 0.25 MG PO TABS
0.2500 mg | ORAL_TABLET | Freq: Three times a day (TID) | ORAL | Status: DC | PRN
  Administered 2022-05-12 – 2022-05-15 (×4): 0.25 mg via ORAL
  Filled 2022-05-10 (×4): qty 1

## 2022-05-10 MED ORDER — LEVOTHYROXINE SODIUM 125 MCG PO TABS
125.0000 ug | ORAL_TABLET | Freq: Every day | ORAL | Status: DC
  Administered 2022-05-10 – 2022-05-17 (×8): 125 ug via ORAL
  Filled 2022-05-10 (×8): qty 1

## 2022-05-10 MED ORDER — METOPROLOL TARTRATE 25 MG PO TABS
12.5000 mg | ORAL_TABLET | Freq: Two times a day (BID) | ORAL | Status: DC
  Administered 2022-05-10 – 2022-05-17 (×13): 12.5 mg via ORAL
  Filled 2022-05-10 (×13): qty 1

## 2022-05-10 MED ORDER — INSULIN ASPART 100 UNIT/ML IJ SOLN
0.0000 [IU] | Freq: Three times a day (TID) | INTRAMUSCULAR | Status: DC
  Administered 2022-05-10: 2 [IU] via SUBCUTANEOUS
  Administered 2022-05-10: 3 [IU] via SUBCUTANEOUS
  Administered 2022-05-11 (×2): 2 [IU] via SUBCUTANEOUS
  Administered 2022-05-12: 3 [IU] via SUBCUTANEOUS

## 2022-05-10 MED ORDER — LACTATED RINGERS IV SOLN: INTRAVENOUS | Status: DC

## 2022-05-10 MED ORDER — PANTOPRAZOLE SODIUM 40 MG PO TBEC
40.0000 mg | DELAYED_RELEASE_TABLET | Freq: Every day | ORAL | Status: DC
  Administered 2022-05-10 – 2022-05-17 (×8): 40 mg via ORAL
  Filled 2022-05-10 (×8): qty 1

## 2022-05-10 MED ORDER — CLOPIDOGREL BISULFATE 75 MG PO TABS
75.0000 mg | ORAL_TABLET | Freq: Every day | ORAL | Status: DC
  Administered 2022-05-11 – 2022-05-17 (×7): 75 mg via ORAL
  Filled 2022-05-10 (×7): qty 1

## 2022-05-10 MED ORDER — ACETAMINOPHEN 325 MG PO TABS: 650.0000 mg | ORAL_TABLET | Freq: Four times a day (QID) | ORAL | Status: DC | PRN

## 2022-05-10 MED ORDER — METHYLPREDNISOLONE SODIUM SUCC 40 MG IJ SOLR
40.0000 mg | Freq: Two times a day (BID) | INTRAMUSCULAR | Status: DC
  Administered 2022-05-10 – 2022-05-11 (×4): 40 mg via INTRAVENOUS
  Filled 2022-05-10 (×4): qty 1

## 2022-05-10 MED ORDER — HYDROMORPHONE HCL 1 MG/ML IJ SOLN
1.0000 mg | INTRAMUSCULAR | Status: DC | PRN
  Administered 2022-05-10 – 2022-05-14 (×15): 2 mg via INTRAVENOUS
  Administered 2022-05-14 – 2022-05-17 (×5): 1 mg via INTRAVENOUS
  Filled 2022-05-10 (×4): qty 2
  Filled 2022-05-10: qty 1
  Filled 2022-05-10: qty 2
  Filled 2022-05-10: qty 1
  Filled 2022-05-10 (×3): qty 2
  Filled 2022-05-10: qty 1
  Filled 2022-05-10 (×8): qty 2
  Filled 2022-05-10 (×2): qty 1

## 2022-05-10 MED ORDER — LACTATED RINGERS IV SOLN: INTRAVENOUS | Status: DC | PRN

## 2022-05-10 MED ORDER — EPHEDRINE 5 MG/ML INJ
INTRAVENOUS | Status: AC
  Filled 2022-05-10: qty 5

## 2022-05-10 MED ORDER — ASPIRIN 81 MG PO TBEC
81.0000 mg | DELAYED_RELEASE_TABLET | Freq: Every day | ORAL | Status: DC
  Administered 2022-05-10 – 2022-05-17 (×8): 81 mg via ORAL
  Filled 2022-05-10 (×8): qty 1

## 2022-05-10 MED ORDER — PHENYLEPHRINE 80 MCG/ML (10ML) SYRINGE FOR IV PUSH (FOR BLOOD PRESSURE SUPPORT)
PREFILLED_SYRINGE | INTRAVENOUS | Status: DC | PRN
  Administered 2022-05-09 (×2): 160 ug via INTRAVENOUS

## 2022-05-10 MED ORDER — MODAFINIL 200 MG PO TABS: 100.0000 mg | ORAL_TABLET | Freq: Every day | ORAL | Status: DC | PRN

## 2022-05-10 MED ORDER — POLYVINYL ALCOHOL 1.4 % OP SOLN: 2.0000 [drp] | Freq: Every day | OPHTHALMIC | Status: DC | PRN

## 2022-05-10 MED ORDER — DICLOFENAC SODIUM 75 MG PO TBEC
75.0000 mg | DELAYED_RELEASE_TABLET | Freq: Two times a day (BID) | ORAL | Status: DC
  Administered 2022-05-10 – 2022-05-17 (×12): 75 mg via ORAL
  Filled 2022-05-10 (×15): qty 1

## 2022-05-10 MED ORDER — MIDAZOLAM HCL 2 MG/2ML IJ SOLN
INTRAMUSCULAR | Status: DC | PRN
  Administered 2022-05-09 (×2): 1 mg via INTRAVENOUS

## 2022-05-10 MED ORDER — INSULIN ASPART 100 UNIT/ML IJ SOLN: 0.0000 [IU] | Freq: Every day | INTRAMUSCULAR | Status: DC

## 2022-05-10 MED ORDER — DICLOFENAC-MISOPROSTOL 75-0.2 MG PO TBEC: 1.0000 | DELAYED_RELEASE_TABLET | Freq: Two times a day (BID) | ORAL | Status: DC

## 2022-05-10 MED ORDER — PROPOFOL 10 MG/ML IV BOLUS
INTRAVENOUS | Status: DC | PRN
  Administered 2022-05-09: 200 mg via INTRAVENOUS

## 2022-05-10 MED ORDER — SENNOSIDES-DOCUSATE SODIUM 8.6-50 MG PO TABS: 1.0000 | ORAL_TABLET | Freq: Every evening | ORAL | Status: DC | PRN

## 2022-05-10 MED ORDER — PHENYLEPHRINE 80 MCG/ML (10ML) SYRINGE FOR IV PUSH (FOR BLOOD PRESSURE SUPPORT)
PREFILLED_SYRINGE | INTRAVENOUS | Status: AC
  Filled 2022-05-10: qty 20

## 2022-05-10 MED ORDER — FENTANYL CITRATE PF 50 MCG/ML IJ SOSY
25.0000 ug | PREFILLED_SYRINGE | INTRAMUSCULAR | Status: DC | PRN
  Administered 2022-05-10 (×2): 50 ug via INTRAVENOUS

## 2022-05-10 MED ORDER — METHYLPREDNISOLONE SODIUM SUCC 125 MG IJ SOLR
INTRAMUSCULAR | Status: DC | PRN
  Administered 2022-05-09: 10 mg via INTRAVENOUS

## 2022-05-10 MED ORDER — DICLOFENAC SODIUM 1 % EX GEL: 4.0000 g | Freq: Every day | CUTANEOUS | Status: DC | PRN

## 2022-05-10 MED ORDER — SERTRALINE HCL 50 MG PO TABS
150.0000 mg | ORAL_TABLET | Freq: Every day | ORAL | Status: DC
  Administered 2022-05-10 – 2022-05-17 (×8): 150 mg via ORAL
  Filled 2022-05-10 (×8): qty 1

## 2022-05-10 MED ORDER — SODIUM CHLORIDE 0.9 % IV BOLUS
1000.0000 mL | Freq: Once | INTRAVENOUS | Status: AC
  Administered 2022-05-10: 1000 mL via INTRAVENOUS

## 2022-05-10 MED ORDER — IOHEXOL 300 MG/ML  SOLN
INTRAMUSCULAR | Status: DC | PRN
  Administered 2022-05-09: 10 mL via URETHRAL

## 2022-05-10 MED ORDER — ROPINIROLE HCL 0.5 MG PO TABS
0.5000 mg | ORAL_TABLET | Freq: Every evening | ORAL | Status: DC | PRN
  Filled 2022-05-10: qty 1

## 2022-05-10 MED ORDER — CYCLOBENZAPRINE HCL 10 MG PO TABS: 10.0000 mg | ORAL_TABLET | Freq: Three times a day (TID) | ORAL | Status: DC | PRN

## 2022-05-10 MED ORDER — FENTANYL CITRATE (PF) 100 MCG/2ML IJ SOLN
INTRAMUSCULAR | Status: DC | PRN
  Administered 2022-05-09 (×2): 50 ug via INTRAVENOUS

## 2022-05-10 MED ORDER — ACETAMINOPHEN 650 MG RE SUPP: 650.0000 mg | Freq: Four times a day (QID) | RECTAL | Status: DC | PRN

## 2022-05-10 NOTE — H&P (Addendum)
PCP:   [name], MD   Chief Complaint: Nausea, vomiting, abdominal pain   HPI: This is a 56 year old male with past medical history of hypertension, CAD, depression and obstructive sleep apnea.  The patient presented to [facility] with complaint of fever, nausea, weakness vomiting and abdominal pain for the past 4 days.  The patient states he was just really really tired and fatigued.  He had flank pain for months, his PCP ordered the Flomax and he thought he passed the stone however, the pain did not improve.  He was not on antibiotics.  He presented to [facility].   UA done was positive for UTI, CT abdomen pelvis revealed 13 mm obstructing stone at right UPJ with associated mild hydronephrosis as well as several nonobstructing bilateral renal calculi. He also had left perinephric stranding secondary to likely recently passed stone.  He was sent to [facility] and taking immediately to the OR for stent placement.  Of note patient is on Plavix for stent placed in February this year.  Review of Systems:  The patient denies anorexia, fever, weight loss,, vision loss, decreased hearing, hoarseness, chest pain, syncope, dyspnea on exertion, peripheral edema, balance deficits, hemoptysis, abdominal pain, melena, hematochezia, severe indigestion/heartburn, hematuria, incontinence, genital sores, muscle weakness, suspicious skin lesions, transient blindness, difficulty walking, depression, unusual weight change, abnormal bleeding, enlarged lymph nodes, angioedema, and breast masses. Positives: Nausea, vomiting, abdominal pain, fever, chills, decreased urine output, foul-smelling urine  Past Medical History: Past Medical History:  Diagnosis Date   Allergic rhinitis    Anxiety    Arthritis    Depression    Diastolic CHF ([place])    Gout    H/O acute respiratory failure    Hypertension    Morbid obesity ([place])    Nephrolithiasis    Peripheral neuropathy    Pituitary tumor     PONV (postoperative nausea and vomiting)    Sleep apnea    uses bi-pap   Past Surgical History:  Procedure Laterality Date   ABDOMINAL SURGERY  06/2013   for cyst with MRSA   ACHILLES TENDON REPAIR Bilateral    APPLICATION OF A-CELL OF CHEST/ABDOMEN N/A 01/26/2014   Procedure: PLACEMENT OF A CELL AND VAC ;  Surgeon: [name], DO;  Location: [facility];  Service: Plastics;  Laterality: N/A;   APPLICATION OF A-CELL OF CHEST/ABDOMEN N/A 02/02/2014   Procedure: WITH PLACEMENT OF A CELL AND VAC ;  Surgeon: [name], DO;  Location: [facility];  Service: Plastics;  Laterality: N/A;   BRAIN SURGERY     COLON RESECTION     CORONARY STENT INTERVENTION N/A 07/28/2021   Procedure: CORONARY STENT INTERVENTION;  Surgeon: Martinique, Peter M, MD;  Location: [facility];  Service: Cardiovascular;  Laterality: N/A;   CRANIOTOMY N/A 12/10/2013   Procedure: Transpheniodal resection of Pituitary tumor with Dr. Radene Journey for approach;  Surgeon: Floyce Stakes, MD;  Location: [facility] NEURO ORS;  Service: Neurosurgery;  Laterality: N/A;  Transpheniodal resection of Pituitary tumor with Dr. Radene Journey for approach   HERNIA REPAIR     INCISION AND DRAINAGE OF WOUND N/A 01/26/2014   Procedure: IRRIGATION AND DEBRIDEMENT ABDOMINAL WOUND WITH ;  Surgeon: [name], DO;  Location: [facility];  Service: Plastics;  Laterality: N/A;   INCISION AND DRAINAGE OF WOUND N/A 02/02/2014   Procedure: IRRIGATION AND DEBRIDEMENT ABDOMINAL WOUND;  Surgeon: [name], DO;  Location: [facility];  Service: Plastics;  Laterality: N/A;   INCISION AND  DRAINAGE OF WOUND N/A 02/12/2014   Procedure: IRRIGATION AND DEBRIDEMENT OF ABDOMINAL WOUND/PLACEMENT OF A-CELL AND VAC;  Surgeon: [name], DO;  Location: [facility];  Service: Plastics;  Laterality: N/A;   INCISION AND DRAINAGE OF WOUND N/A 04/01/2014   Procedure: IRRIGATION AND DEBRIDEMENT ABDOMINAL WOUND WITH PLACEMENT OF ACELL/VAC;  Surgeon: [name], DO;  Location: [facility];  Service:  Plastics;  Laterality: N/A;   LEFT HEART CATH AND CORONARY ANGIOGRAPHY N/A 07/28/2021   Procedure: LEFT HEART CATH AND CORONARY ANGIOGRAPHY;  Surgeon: Martinique, Peter M, MD;  Location: [facility];  Service: Cardiovascular;  Laterality: N/A;   Sigmoid Colonoscopy     TRANSNASAL APPROACH N/A 12/10/2013   Procedure: TRANSNASAL APPROACH;  Surgeon: Rozetta Nunnery, MD;  Location: [facility] NEURO ORS;  Service: ENT;  Laterality: N/A;    Medications: Prior to Admission medications   Medication Sig Start Date End Date Taking? Authorizing Provider  allopurinol (ZYLOPRIM) 300 MG tablet Take 300 mg by mouth daily.    [provider]  ALPRAZolam (XANAX) 0.25 MG tablet Take 1 tablet (0.25 mg total) by mouth 3 (three) times daily as needed for anxiety or sleep (and QHS prn insomnia). 11/03/15   Hayden Pedro, PA-C  aspirin 81 MG chewable tablet Chew 81 mg by mouth daily. 06/13/21   [provider]  buPROPion (WELLBUTRIN XL) 300 MG 24 hr tablet Take 300 mg by mouth daily.    [provider]  carboxymethylcellulose (REFRESH PLUS) 0.5 % SOLN Place 2 drops into both eyes daily as needed (for dry eyes).    [provider]  clopidogrel (PLAVIX) 75 MG tablet Take 1 tablet (75 mg total) by mouth daily with breakfast. 07/30/21   Bhagat, Crista Luria, PA  cyclobenzaprine (FLEXERIL) 10 MG tablet Take 10 mg by mouth 3 (three) times daily as needed for muscle spasms.    [provider]  diclofenac sodium (VOLTAREN) 1 % GEL Apply 4 g topically daily as needed (pain).    [provider]  Diclofenac-miSOPROStol 75-0.2 MG TBEC Take 1 tablet by mouth 2 (two) times daily.     [provider]  hydrocortisone (CORTEF) 20 MG tablet Take 20 mg by mouth daily.    [provider]  isosorbide mononitrate (IMDUR) 30 MG 24 hr tablet Take 1 tablet (30 mg total) by mouth daily. 07/30/21   Leanor Kail, PA  levothyroxine (SYNTHROID) 125 MCG tablet Take 125  mcg by mouth daily before breakfast. 05/02/21   [provider]  metoprolol tartrate (LOPRESSOR) 25 MG tablet Take 0.5 tablets (12.5 mg total) by mouth 2 (two) times daily. 07/29/21   Bhagat, Crista Luria, PA  modafinil (PROVIGIL) 100 MG tablet Take 100 mg by mouth daily as needed (take for drowsiness). 01/08/20   [provider]  pantoprazole (PROTONIX) 40 MG tablet Take 40 mg by mouth daily. 01/17/21   [provider]  pseudoephedrine (SUDAFED) 120 MG 12 hr tablet Take 120 mg by mouth every 12 (twelve) hours as needed for congestion.    [provider]  rOPINIRole (REQUIP) 0.5 MG tablet Take 0.5 mg by mouth at bedtime as needed (RLS). 01/08/20   [provider]  rosuvastatin (CRESTOR) 20 MG tablet TAKE 1 TABLET DAILY 12/19/21   Croitoru, Mihai, MD  Semaglutide, 1 MG/DOSE, (OZEMPIC, 1 MG/DOSE,) 4 MG/3ML SOPN Inject 1 mg into the skin every Sunday.    [provider]  sertraline (ZOLOFT) 100 MG tablet Take 150 mg by mouth daily.  [provider]  traMADol (ULTRAM) 50 MG tablet Take 50-100 mg by mouth at bedtime as needed for moderate pain.    [provider]  zolpidem (AMBIEN) 10 MG tablet Take 1 tablet (10 mg total) by mouth at bedtime as needed for sleep. Patient taking differently: Take 10 mg by mouth at bedtime. 05/15/16   Tyler Pita, MD    Allergies:   Allergies  Allergen Reactions   Morphine Hives, Itching and Rash   Penicillins Anaphylaxis    Social History:  reports that he has never smoked. He has never used smokeless tobacco. He reports that he does not drink alcohol and does not use drugs.  Family History: Family History  Problem Relation Age of Onset   Hypertension Mother    Hypertension Father    Gout Father    Hypothyroidism Father    Gout Brother    Cancer Maternal Aunt        colon   Cancer Maternal Grandmother        breast    Physical Exam: Vitals:   05/09/22 2251 05/09/22 2356 05/10/22  0000 05/10/22 0008  BP: 114/72 (!) 79/45 (!) 93/56 (P) 107/67  Pulse: 72 70 64 65  Resp: '12 13 19 20  '$ Temp: 98.4 F (36.9 C) (!) 97.4 F (36.3 C)    TempSrc:      SpO2: 100% 100% 100% 99%  Weight:      Height:        General:  Alert and oriented times three, ill-appearing post-op gentleman. Eyes: PERRLA, pink conjunctiva, no scleral icterus ENT: Moist oral mucosa, neck supple, no thyromegaly Lungs: clear to ascultation, no wheeze, no crackles, no use of accessory muscles Cardiovascular: regular rate and rhythm, no regurgitation, no gallops, no murmurs. No carotid bruits, no JVD Abdomen: soft, positive BS, mild generalized tenderness to palpation,, non-distended, no organomegaly, not an acute abdomen GU: not examined Neuro: CN II - XII grossly intact, sensation intact Musculoskeletal: strength 5/5 all extremities, no clubbing, cyanosis or edema Skin: no rash, no subcutaneous crepitation, no decubitus Psych: appropriate patient   Labs on Admission:  Recent Labs    05/08/22 1839 05/08/22 1858 05/09/22 1723  NA 131* 133* 133*  K 3.4* 3.5 3.8  CL 97* 97* 95*  CO2 21*  --  21*  GLUCOSE 100* 99 98  BUN 14 13 21*  CREATININE 1.51* 1.30* 1.39*  CALCIUM 8.2*  --  8.7*   Recent Labs    05/08/22 1839 05/09/22 1723  AST 19 49*  ALT 14 28  ALKPHOS 70 81  BILITOT 1.3* 1.1  PROT 6.4* 7.2  ALBUMIN 2.8* 3.8   Recent Labs    05/08/22 1839 05/09/22 1723  LIPASE 25 <10*   Recent Labs    05/08/22 1839 05/08/22 1858 05/09/22 1723  WBC 9.6  --  6.6  NEUTROABS 8.3*  --   --   HGB 13.2 12.9* 13.5  HCT 37.2* 38.0* 39.6  MCV 85.5  --  86.5  PLT 98*  --  103*     Radiological Exams on Admission: DG C-Arm 1-60 Min-No Report  Result Date: 05/09/2022 Fluoroscopy was utilized by the requesting physician.  No radiographic interpretation.   CT Renal Stone Study  Result Date: 05/09/2022 CLINICAL DATA:  Abdominal pain.  Concern for kidney stone. EXAM: CT ABDOMEN AND PELVIS  WITHOUT CONTRAST TECHNIQUE: Multidetector CT imaging of the abdomen and pelvis was performed following the standard protocol without IV contrast. RADIATION DOSE REDUCTION: This  exam was performed according to the departmental dose-optimization program which includes automated exposure control, adjustment of the mA and/or kV according to patient size and/or use of iterative reconstruction technique. COMPARISON:  CT abdomen pelvis dated 05/08/2022. FINDINGS: Evaluation of this exam is limited in the absence of intravenous contrast. Lower chest: The visualized lung bases are clear. Advanced coronary vascular calcification of the LAD. No intra-abdominal free air or free fluid. Hepatobiliary: Fatty liver. No biliary dilatation. Probable noncalcified gallstone in the proximal gallbladder versus a tumefactive sludge. No pericholecystic fluid or evidence of acute cholecystitis by CT. Pancreas: Unremarkable. No pancreatic ductal dilatation or surrounding inflammatory changes. Spleen: Splenomegaly measuring 18 cm in craniocaudal length. Adrenals/Urinary Tract: The adrenal glands are unremarkable. Similar positioning of a 13 mm obstructing stone at the right UPJ. There is mild right hydronephrosis. Several additional nonobstructing bilateral renal calculi measure up to 11 mm in the inferior pole of the left kidney. There is no hydronephrosis on the left. Small right renal interpolar cyst. Additional subcentimeter right renal inferior pole hypodense lesion which is too small to characterize. No imaging follow-up. Mild left perinephric stranding, nonspecific and may be related to recently passed stone. Correlation with urinalysis recommended to exclude UTI. The visualized ureters and urinary bladder appear unremarkable. Stomach/Bowel: Postsurgical changes of the rectosigmoid with anastomotic suture. Several small scattered sigmoid diverticula without active inflammatory changes. There is a small hiatal hernia. There is no bowel  obstruction or active inflammation. The appendix is normal. Vascular/Lymphatic: Mild aortoiliac atherosclerotic disease. The IVC is unremarkable. No portal venous gas. There is no adenopathy. Reproductive: The prostate gland is small. Other: Small fat containing left inguinal hernia. Partially visualized skin thickening and induration of the subcutaneous soft tissues of the right side of the pannus may represent cellulitis. No drainable fluid collection/abscess identified. Fatty atrophy of the distal right rectus muscle. Musculoskeletal: Degenerative changes of the spine. L4-L5 endplate irregularity and disc space narrowing, likely degenerative. An infectious process is less likely but not excluded clinical correlation is recommended. No acute osseous pathology. Old right rib fractures. IMPRESSION: 1. Similar positioning of a 13 mm obstructing stone at the right UPJ with mild right hydronephrosis. Several additional nonobstructing bilateral renal calculi. 2. Mild left perinephric stranding, nonspecific and may be related to recently passed stone. Correlation with urinalysis recommended to exclude UTI. 3. Fatty liver. 4. Splenomegaly. 5. Colonic diverticulosis. No bowel obstruction. Normal appendix. 6.  Aortic Atherosclerosis (ICD10-I70.0). Electronically Signed   By: Anner Crete M.D.   On: 05/09/2022 19:33   DG Chest 2 View  Result Date: 05/08/2022 CLINICAL DATA:  Chest pain EXAM: CHEST - 2 VIEW COMPARISON:  Chest x-ray 12/11/2013 FINDINGS: The heart size and mediastinal contours are within normal limits. Both lungs are clear. There are healed bilateral rib fractures, unchanged. IMPRESSION: No active cardiopulmonary disease. Electronically Signed   By: Ronney Asters M.D.   On: 05/08/2022 19:59   CT RENAL STONE STUDY  Result Date: 05/08/2022 CLINICAL DATA:  Patient reports abdominal pain for 3 days after passing kidney stone. EXAM: CT ABDOMEN AND PELVIS WITHOUT CONTRAST TECHNIQUE: Multidetector CT  imaging of the abdomen and pelvis was performed following the standard protocol without IV contrast. RADIATION DOSE REDUCTION: This exam was performed according to the departmental dose-optimization program which includes automated exposure control, adjustment of the mA and/or kV according to patient size and/or use of iterative reconstruction technique. COMPARISON:  CT 03/24/2014 FINDINGS: Lower chest: Chronic elevation of right hemidiaphragm. There are remote bilateral rib fractures many  of which demonstrate nonunion. No acute airspace disease or pleural effusion. Hepatobiliary: Diffusely decreased hepatic density typical of steatosis. The liver is enlarged spanning 19.5 cm cranial caudal. No evidence of focal hepatic abnormality. Mild gallbladder distension. Suspected noncalcified intraluminal gallstones. No pericholecystic inflammation. No biliary dilatation. Pancreas: Mild fatty atrophy.  No ductal dilatation or inflammation. Spleen: Enlarged spanning 17.8 cm cranial caudal. No focal abnormalities on this unenhanced exam. Adrenals/Urinary Tract: No adrenal nodules. There is a 13 mm stone at/just beyond the right ureteropelvic junction with mild hydronephrosis. Trace right perinephric edema. At least 4 nonobstructing intrarenal calculi in the right kidney. Ureter is decompressed. Simple cyst posteriorly in the mid right kidney. No imaging follow-up is needed. There is left perinephric edema but no hydronephrosis. The left ureter is decompressed. There are 2 nonobstructing intrarenal calculi on the left, larger in the lower pole measures 12 mm. Mild to moderate left perinephric edema. No evidence of suspicious renal lesion. The urinary bladder is only minimally distended, no bladder wall thickening or stone. Stomach/Bowel: Small hiatal hernia. Otherwise unremarkable stomach. No small bowel obstruction or inflammation. Normal appendix visualized. Fluid/liquid stool in the proximal colon. Small volume of formed  stool in the distal colon. Mild sigmoid diverticulosis without diverticulitis. Sigmoid enteric sutures. Vascular/Lymphatic: Mild aortic atherosclerosis. No aneurysm. There are few prominent right external iliac and inguinal nodes are not enlarged by size criteria. No suspicious adenopathy. Reproductive: Diminutive prostate. Other: There is diffuse skin thickening and subcutaneous edema of the right lower abdominal pannus. This is incompletely included in the field of view due to habitus. No soft tissue gas. No definite subcutaneous fluid collection. Postsurgical change of the anterior abdominal wall. No intra-abdominal ascites or free air Musculoskeletal: L4-L5 disc space loss with endplate irregularity and sclerosis. Bilateral hip osteoarthritis. IMPRESSION: 1. Obstructing 13 mm stone at/just beyond the right ureteropelvic junction with mild hydronephrosis. 2. Bilateral nonobstructing intrarenal calculi. 3. Left perinephric edema which may indicate recently passed stone. No left hydronephrosis. 4. Hepatosplenomegaly and hepatic steatosis. 5. Skin thickening and subcutaneous edema of the right lower abdominal pannus, incompletely included in the field of view due to habitus. Recommend correlation for cellulitis. 6. Suspected noncalcified gallstones. 7. Mild sigmoid diverticulosis without diverticulitis. 8. L4-L5 disc space loss with endplate irregularity and sclerosis. This may be degenerative, however if there is clinical concern for discitis/osteomyelitis, recommend further evaluation with lumbar spine MRI. Aortic Atherosclerosis (ICD10-I70.0). Electronically Signed   By: Keith Rake M.D.   On: 05/08/2022 19:11    Assessment/Plan Present on Admission:  Infected renal calculi/acute pyonephritis -Admit to progressive care -Stent just deployed by urology -Blood and urine cultures collected -Continue IV Cipro, patient has anaphylaxis to penicillin -Patient borderline blood pressure, 1 L normal saline  bolus given, continue IV fluid hydration -As needed blood pressure medication -Careful, patient on chronic steroids given immunocompromise  CAD -Resume Plavix per urology -H&H in a.m. -Imdur, metoprolol held while patient being rehydrated -ASA resumed  Depression -Zoloft resumed  Hypothyroidism -Stable, Synthroid resumed  Dyslipidemia -Stable, Crestor resumed   Chronic respiratory failure with hypercapnia (HCC)/chronic diastolic heart failure  Obesity hypoventilation syndrome (HCC)/OSA -CPAP ordered -Provigil ordered   Pituitary adenoma ([place])  Physical deconditioning  Panhypopituitarism ([place]) -On p.o. hydrocortisone, will order IV Solu-Medrol 40 mg daily  Pari Lombard 05/10/2022, 12:13 AM

## 2022-05-10 NOTE — Anesthesia Procedure Notes (Signed)
Date/Time: 05/10/2022 11:50 PM  Performed by: Cynda Familia, CRNAOxygen Delivery Method: Simple face mask Placement Confirmation: breath sounds checked- equal and bilateral and positive ETCO2 Dental Injury: Teeth and Oropharynx as per pre-operative assessment

## 2022-05-10 NOTE — Progress Notes (Signed)
Pharmacy Antibiotic Note  [name] [name] is a 56 y.o. male admitted on 05/09/2022 with  pyelonephritis. Pharmacy has been consulted for Cipro dosing.  S/p stent placement for infected L ureteral stone.   PCN allergy= anaphylaxis noted.   Plan: Cipro '400mg'$  IV q12h No dose adjustments anticipated.  Pharmacy will sign off and monitor peripherally via electronic surveillance software for any changes in renal function or micro data.   Height: 6' (182.9 cm) Weight: (!) 180 kg (396 lb 13.3 oz) IBW/kg (Calculated) : 77.6  Temp (24hrs), Avg:98.7 F (37.1 C), Min:97.4 F (36.3 C), Max:99.9 F (37.7 C)  Recent Labs  Lab 05/08/22 1839 05/08/22 1858 05/09/22 1723  WBC 9.6  --  6.6  CREATININE 1.51* 1.30* 1.39*    Estimated Creatinine Clearance: 97.2 mL/min (A) (by C-G formula based on SCr of 1.39 mg/dL (H)).    Allergies  Allergen Reactions   Morphine Hives, Itching and Rash   Penicillins Anaphylaxis    Thank you for allowing pharmacy to be a part of this patient's care.  Netta Cedars PharmD 05/10/2022 12:39 AM

## 2022-05-10 NOTE — TOC Initial Note (Signed)
Transition of Care [facility]) - Initial/Assessment Note    Patient Details  Name: [name] [name] MRN: [id number] Date of Birth: [birth year]  Transition of Care [facility]) CM/SW Contact:    [name], RN Phone Number: 05/10/2022, 9:27 AM  Clinical Narrative:                  Transition of Care [facility]) Screening Note   Patient Details  Name: [name] [name] Date of Birth: February 09, 1966   Transition of Care [facility]) CM/SW Contact:    [name], RN Phone Number: 05/10/2022, 9:27 AM    Transition of Care Department [facility]) has reviewed patient and no TOC needs have been identified at this time. We will continue to monitor patient advancement through interdisciplinary progression rounds. If new patient transition needs arise, please place a TOC consult.    Expected Discharge Plan: Home/Self Care Barriers to Discharge: Continued Medical Work up   Patient Goals and CMS Choice Patient states their goals for this hospitalization and ongoing recovery are:: to go home CMS Medicare.gov Compare Post Acute Care list provided to:: Legal Guardian Choice offered to / list presented to : [facility] POA / Guardian  Expected Discharge Plan and Services Expected Discharge Plan: Home/Self Care   Discharge Planning Services: CM Consult   Living arrangements for the past 2 months: Single Family Home                                      Prior Living Arrangements/Services Living arrangements for the past 2 months: Single Family Home Lives with:: Self Patient language and need for interpreter reviewed:: Yes Do you feel safe going back to the place where you live?: Yes      Need for Family Participation in Patient Care: Yes (Comment) (legal guardian-mother) Care giver support system in place?: Yes (comment)   Criminal Activity/Legal Involvement Pertinent to Current Situation/Hospitalization: No - Comment as needed  Activities of Daily Living Home Assistive Devices/Equipment: Other  (Comment), CPAP, Bedside commode/3-in-1, Eyeglasses (Power chair) ADL Screening (condition at time of admission) Patient's cognitive ability adequate to safely complete daily activities?: Yes Is the patient deaf or have difficulty hearing?: No Does the patient have difficulty seeing, even when wearing glasses/contacts?: No Does the patient have difficulty concentrating, remembering, or making decisions?: No Patient able to express need for assistance with ADLs?: Yes Does the patient have difficulty dressing or bathing?: No Independently performs ADLs?: Yes (appropriate for developmental age) Does the patient have difficulty walking or climbing stairs?: Yes Weakness of Legs: Both Weakness of Arms/Hands: None  Permission Sought/Granted                  Emotional Assessment Appearance:: Appears stated age Attitude/Demeanor/Rapport: Engaged Affect (typically observed): Calm Orientation: : Oriented to Self, Oriented to Place, Oriented to  Time, Oriented to Situation Alcohol / Substance Use: Never Used Psych Involvement: No (comment)  Admission diagnosis:  Kidney stone [N20.0] Pyelonephritis [N12] Sepsis ([place]) [A41.9] Patient Active Problem List   Diagnosis Date Noted   Sepsis ([place]) 05/10/2022   Angina pectoris ([place]) 07/28/2021   Idiopathic chronic venous hypertension of right lower extremity with ulcer and inflammation ([place]) 07/01/2018   Body mass index 50.0-59.9, adult ([place]) 07/01/2018   Obesity hypoventilation syndrome ([place]) 02/17/2014   Atelectasis 02/17/2014   New onset of headaches 01/01/2014   Wound, open, anterior abdominal wall 01/01/2014   Physical deconditioning 12/20/2013  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   Gout 12/15/2013   Clinical depression 12/15/2013   Chronic respiratory failure with hypercapnia ([place]) 12/07/2013   Morbid obesity ([place]) 12/07/2013   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[place]) 12/06/2013   Pituitary adenoma ([place])  12/06/2013   Intrinsic asthma 10/06/2013   OSA (obstructive sleep apnea) 10/06/2013   Injury of kidney 08/07/2013   Acute on chronic diastolic heart failure ([place]) 08/07/2013   Disorder of peripheral nervous system 08/07/2013   Generalized OA 08/07/2013   Disuse syndrome 08/07/2013   Extreme obesity 08/02/2013   Chronic diastolic heart failure ([place]) 08/02/2013   Infected wound 07/28/2013   Elevated blood uric acid level 04/23/2013   Abdominal wall abscess 12/27/2012   Nephrolithiasis 11/22/2012   Kidney cysts 11/22/2012   BP (high blood pressure) 05/21/2012   H/O infectious disease 12/04/2011   PCP:  [name], MD Pharmacy:   [facility] [phone] [place], [place] - [address] [place] [address] Phone: [phone] Fax: [phone]     Social Determinants of Health (SDOH) Interventions    Readmission Risk Interventions   No data to display

## 2022-05-10 NOTE — Transfer of Care (Signed)
Immediate Anesthesia Transfer of Care Note  Patient: [name] [name]  Procedure(s) Performed: CYSTOSCOPY WITH RETROGRADE PYELOGRAM AND STENT PLACEMENT (Left: Urethra)  Patient Location: PACU  Anesthesia Type:General  Level of Consciousness: sedated  Airway & Oxygen Therapy: Patient Spontanous Breathing and Patient connected to face mask oxygen  Post-op Assessment: Report given to RN and Post -op Vital signs reviewed and stable  Post vital signs: Reviewed and stable  Last Vitals:  Vitals Value Taken Time  BP 107/67 05/10/22 0008  Temp 36.3 C 05/09/22 2356  Pulse 68 05/10/22 0011  Resp 17 05/10/22 0011  SpO2 99 % 05/10/22 0011  Vitals shown include unvalidated device data.  Last Pain:  Vitals:   05/09/22 2238  TempSrc:   PainSc: 9          Complications: No notable events documented.

## 2022-05-10 NOTE — Anesthesia Procedure Notes (Signed)
Procedure Name: LMA Insertion Date/Time: 05/10/2022 11:26 PM  Performed by: Cynda Familia, CRNAPre-anesthesia Checklist: Patient identified, Emergency Drugs available, Suction available and Patient being monitored Patient Re-evaluated:Patient Re-evaluated prior to induction Oxygen Delivery Method: Circle System Utilized Preoxygenation: Pre-oxygenation with 100% oxygen Induction Type: IV induction Ventilation: Mask ventilation without difficulty LMA: LMA inserted and LMA with gastric port inserted LMA Size: 5.0 Number of attempts: 1 Placement Confirmation: positive ETCO2 Tube secured with: Tape Dental Injury: Teeth and Oropharynx as per pre-operative assessment  Comments: IV induction [place]--LMA insertion AM CRNA atraumatic-- teeth and mouth as preop-- bilat BS

## 2022-05-10 NOTE — Progress Notes (Signed)
PROGRESS NOTE    [name] [name]  IRJ:[id number] DOB: 1966-05-29 DOA: 05/09/2022 PCP: [name], MD    Chief Complaint  Patient presents with   Emesis   Urinary Tract Infection    Brief Narrative:  HPI per Dr. Claria Dice  This is a 56 year old male with past medical history of hypertension, CAD, depression and obstructive sleep apnea.  The patient presented to [facility] with complaint of fever, nausea, weakness vomiting and abdominal pain for the past 4 days.  The patient states he was just really really tired and fatigued.  He had flank pain for months, his PCP ordered the Flomax and he thought he passed the stone however, the pain did not improve.  He was not on antibiotics.  He presented to [place].    UA done was positive for UTI, CT abdomen pelvis revealed 13 mm obstructing stone at right UPJ with associated mild hydronephrosis as well as several nonobstructing bilateral renal calculi. He also had left perinephric stranding secondary to likely recently passed stone.  He was sent to [facility] and taking immediately to the OR for stent placement.   Of note patient is on Plavix for stent placed in February this year.    Assessment & Plan:   Principal Problem:   Sepsis ([place]) Active Problems:   OSA (obstructive sleep apnea)   Pituitary adenoma (H[facility])   Chronic respiratory failure with hypercapnia (H[facility])   Morbid obesity (HCC)   Nephrolithiasis   Chronic diastolic heart failure (H[facility])   Panhypopituitarism (H[facility])   Physical deconditioning   Obesity hypoventilation syndrome ([place])  #1 infected left urethral calculi/acute pyelonephritis -Patient noted to have presented with nausea, vomiting, abdominal pain, CVA tenderness, generalized fatigue.  Concern patient may have passed a stone. -Urinalysis done concerning for UTI.  CT abdomen and pelvis stone protocol revealed a 13 mm obstructing stone at the right UPJ with associated mild hydronephrosis as well as  several nonobstructing bilateral renal calculi.  Left perinephric stranding noted secondary to likely recently passed stone. -Patient transferred to [facility] long ED, seen by urology and taken immediately to the OR where patient underwent cystoscopy, left ureteral stent placement, left retrograde pyelography with interpretation. -Check urine cultures. -Blood cultures obtained and pending. -Empiric IV ciprofloxacin. -Per urology.  2.  CAD -Stable. -Per admitting physician urology cleared resumption of patient's Plavix. -Blood pressure noted to be soft/borderline and as such Imdur held as well as metoprolol. -Resume metoprolol. -Continue aspirin, resume Plavix. -Continue statin. -Supportive care.  3.  Hypothyroidism -Synthroid.  4.  Depression -Zoloft.  5.  Hyperlipidemia -Statin.  6.  Chronic respiratory failure with hypercapnia/chronic diastolic heart failure/obesity hypoventilation syndrome -Continue Provigil. -CPAP.  7.  Pituitary adenoma/physical deconditioning/panhypopituitarism -Noted to have been on hydrocortisone prior to admission, currently on IV Solu-Medrol, as patient improves clinically would likely be transition back to home regimen hydrocortisone in the next few days.  8.  Morbid obesity -Lifestyle modification.   DVT prophylaxis: SCDs Code Status: Full Family Communication: Updated patient with no family at bedside. Disposition: Likely home when clinically improved.  Status is: Inpatient Remains inpatient appropriate because: Severity of illness   Consultants:  Urology: Dr. Louis Meckel 05/09/2022  Procedures:  CT renal stone protocol 05/09/2022, 05/08/2022 Cystoscopy/left ureteral stent placement/left retrograde pyelography with interpretation per urology: Dr. Louis Meckel 05/09/2022  Antimicrobials:  IV ciprofloxacin 05/09/2022>>>>   Subjective: Patient laying in bed.  States some improvement left flank pain.  Still with some complaints of pain in the penile  region which she  states feels like a kidney stone..  No nausea or vomiting.  No chest pain.  Suprapubic abdominal pain improved.  Objective: Vitals:   05/10/22 0219 05/10/22 0619 05/10/22 1015 05/10/22 1402  BP: (!) 158/104 (!) 152/99 134/75 135/89  Pulse: 62 (!) 57 66 66  Resp: '18 20 20 '$ (!) 21  Temp: 97.9 F (36.6 C) (!) 95.8 F (35.4 C) 97.7 F (36.5 C) (!) 97.5 F (36.4 C)  TempSrc:  Axillary  Oral  SpO2: 98% 97% 95% 93%  Weight:      Height:        Intake/Output Summary (Last 24 hours) at 05/10/2022 1408 Last data filed at 05/10/2022 0630 Gross per 24 hour  Intake 3592.81 ml  Output 1765 ml  Net 1827.81 ml   Filed Weights   05/09/22 1713  Weight: (!) 180 kg    Examination:  General exam: Appears calm and comfortable  Respiratory system: Clear to auscultation.  No wheezes, no crackles, no rhonchi.  Respiratory effort normal. Cardiovascular system: S1 & S2 heard, RRR. No JVD, murmurs, rubs, gallops or clicks. No pedal edema. Gastrointestinal system: Abdomen is nondistended, soft and nontender. No organomegaly or masses felt. Normal bowel sounds heard.  No significant CVA tenderness to palpation. Central nervous system: Alert and oriented. No focal neurological deficits. Extremities: Symmetric 5 x 5 power. Skin: No rashes, lesions or ulcers Psychiatry: Judgement and insight appear normal. Mood & affect appropriate.     Data Reviewed: I have personally reviewed following labs and imaging studies  CBC: Recent Labs  Lab 05/08/22 1839 05/08/22 1858 05/09/22 1723 05/10/22 0735  WBC 9.6  --  6.6 3.5*  NEUTROABS 8.3*  --   --  3.1  HGB 13.2 12.9* 13.5 12.5*  HCT 37.2* 38.0* 39.6 37.4*  MCV 85.5  --  86.5 89.7  PLT 98*  --  103* 89*    Basic Metabolic Panel: Recent Labs  Lab 05/08/22 1839 05/08/22 1858 05/09/22 1723 05/10/22 0735  NA 131* 133* 133* 134*  K 3.4* 3.5 3.8 3.9  CL 97* 97* 95* 101  CO2 21*  --  21* 25  GLUCOSE 100* 99 98 157*  BUN 14 13 21*  17  CREATININE 1.51* 1.30* 1.39* 1.09  CALCIUM 8.2*  --  8.7* 8.1*    GFR: Estimated Creatinine Clearance: 123.9 mL/min (by C-G formula based on SCr of 1.09 mg/dL).  Liver Function Tests: Recent Labs  Lab 05/08/22 1839 05/09/22 1723  AST 19 49*  ALT 14 28  ALKPHOS 70 81  BILITOT 1.3* 1.1  PROT 6.4* 7.2  ALBUMIN 2.8* 3.8    CBG: Recent Labs  Lab 05/10/22 0034 05/10/22 0747 05/10/22 1133  GLUCAP 105* 154* 142*     No results found for this or any previous visit (from the past 240 hour(s)).       Radiology Studies: DG C-Arm 1-60 Min-No Report  Result Date: 05/09/2022 Fluoroscopy was utilized by the requesting physician.  No radiographic interpretation.   CT Renal Stone Study  Result Date: 05/09/2022 CLINICAL DATA:  Abdominal pain.  Concern for kidney stone. EXAM: CT ABDOMEN AND PELVIS WITHOUT CONTRAST TECHNIQUE: Multidetector CT imaging of the abdomen and pelvis was performed following the standard protocol without IV contrast. RADIATION DOSE REDUCTION: This exam was performed according to the departmental dose-optimization program which includes automated exposure control, adjustment of the mA and/or kV according to patient size and/or use of iterative reconstruction technique. COMPARISON:  CT abdomen pelvis dated 05/08/2022. FINDINGS: Evaluation of  this exam is limited in the absence of intravenous contrast. Lower chest: The visualized lung bases are clear. Advanced coronary vascular calcification of the LAD. No intra-abdominal free air or free fluid. Hepatobiliary: Fatty liver. No biliary dilatation. Probable noncalcified gallstone in the proximal gallbladder versus a tumefactive sludge. No pericholecystic fluid or evidence of acute cholecystitis by CT. Pancreas: Unremarkable. No pancreatic ductal dilatation or surrounding inflammatory changes. Spleen: Splenomegaly measuring 18 cm in craniocaudal length. Adrenals/Urinary Tract: The adrenal glands are unremarkable. Similar  positioning of a 13 mm obstructing stone at the right UPJ. There is mild right hydronephrosis. Several additional nonobstructing bilateral renal calculi measure up to 11 mm in the inferior pole of the left kidney. There is no hydronephrosis on the left. Small right renal interpolar cyst. Additional subcentimeter right renal inferior pole hypodense lesion which is too small to characterize. No imaging follow-up. Mild left perinephric stranding, nonspecific and may be related to recently passed stone. Correlation with urinalysis recommended to exclude UTI. The visualized ureters and urinary bladder appear unremarkable. Stomach/Bowel: Postsurgical changes of the rectosigmoid with anastomotic suture. Several small scattered sigmoid diverticula without active inflammatory changes. There is a small hiatal hernia. There is no bowel obstruction or active inflammation. The appendix is normal. Vascular/Lymphatic: Mild aortoiliac atherosclerotic disease. The IVC is unremarkable. No portal venous gas. There is no adenopathy. Reproductive: The prostate gland is small. Other: Small fat containing left inguinal hernia. Partially visualized skin thickening and induration of the subcutaneous soft tissues of the right side of the pannus may represent cellulitis. No drainable fluid collection/abscess identified. Fatty atrophy of the distal right rectus muscle. Musculoskeletal: Degenerative changes of the spine. L4-L5 endplate irregularity and disc space narrowing, likely degenerative. An infectious process is less likely but not excluded clinical correlation is recommended. No acute osseous pathology. Old right rib fractures. IMPRESSION: 1. Similar positioning of a 13 mm obstructing stone at the right UPJ with mild right hydronephrosis. Several additional nonobstructing bilateral renal calculi. 2. Mild left perinephric stranding, nonspecific and may be related to recently passed stone. Correlation with urinalysis recommended to  exclude UTI. 3. Fatty liver. 4. Splenomegaly. 5. Colonic diverticulosis. No bowel obstruction. Normal appendix. 6.  Aortic Atherosclerosis (ICD10-I70.0). Electronically Signed   By: Anner Crete M.D.   On: 05/09/2022 19:33   DG Chest 2 View  Result Date: 05/08/2022 CLINICAL DATA:  Chest pain EXAM: CHEST - 2 VIEW COMPARISON:  Chest x-ray 12/11/2013 FINDINGS: The heart size and mediastinal contours are within normal limits. Both lungs are clear. There are healed bilateral rib fractures, unchanged. IMPRESSION: No active cardiopulmonary disease. Electronically Signed   By: Ronney Asters M.D.   On: 05/08/2022 19:59   CT RENAL STONE STUDY  Result Date: 05/08/2022 CLINICAL DATA:  Patient reports abdominal pain for 3 days after passing kidney stone. EXAM: CT ABDOMEN AND PELVIS WITHOUT CONTRAST TECHNIQUE: Multidetector CT imaging of the abdomen and pelvis was performed following the standard protocol without IV contrast. RADIATION DOSE REDUCTION: This exam was performed according to the departmental dose-optimization program which includes automated exposure control, adjustment of the mA and/or kV according to patient size and/or use of iterative reconstruction technique. COMPARISON:  CT 03/24/2014 FINDINGS: Lower chest: Chronic elevation of right hemidiaphragm. 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 No acute airspace disease or pleural effusion. Hepatobiliary: Diffusely decreased hepatic density typical of steatosis. The liver is enlarged spanning 19.5 cm cranial caudal. No evidence of focal hepatic abnormality. Mild gallbladder distension. Suspected noncalcified intraluminal  gallstones. No pericholecystic inflammation. No biliary dilatation. Pancreas: Mild fatty atrophy.  No ductal dilatation or inflammation. Spleen: Enlarged spanning 17.8 cm cranial caudal. No focal abnormalities on this unenhanced exam. Adrenals/Urinary Tract: No adrenal nodules. There is a 13 mm stone  at/just beyond the right ureteropelvic junction with mild hydronephrosis. Trace right perinephric edema. At least 4 nonobstructing intrarenal calculi in the right kidney. Ureter is decompressed. Simple cyst posteriorly in the mid right kidney. No imaging follow-up is needed. There is left perinephric edema but no hydronephrosis. The left ureter is decompressed. There are 2 nonobstructing intrarenal calculi on the left, larger in the lower pole measures 12 mm. Mild to moderate left perinephric edema. No evidence of suspicious renal lesion. The urinary bladder is only minimally distended, no bladder wall thickening or stone. Stomach/Bowel: Small hiatal hernia. Otherwise unremarkable stomach. No small bowel obstruction or inflammation. Normal appendix visualized. Fluid/liquid stool in the proximal colon. Small volume of formed stool in the distal colon. Mild sigmoid diverticulosis without diverticulitis. Sigmoid enteric sutures. Vascular/Lymphatic: Mild aortic atherosclerosis. No aneurysm. There are few prominent right external iliac and inguinal nodes are not enlarged by size criteria. No suspicious adenopathy. Reproductive: Diminutive prostate. Other: There is diffuse skin thickening and subcutaneous edema of the right lower abdominal pannus. This is incompletely included in the field of view due to habitus. No soft tissue gas. No definite subcutaneous fluid collection. Postsurgical change of the anterior abdominal wall. No intra-abdominal ascites or free air Musculoskeletal: L4-L5 disc space loss with endplate irregularity and sclerosis. Bilateral hip osteoarthritis. IMPRESSION: 1. Obstructing 13 mm stone at/just beyond the right ureteropelvic junction with mild hydronephrosis. 2. Bilateral nonobstructing intrarenal calculi. 3. Left perinephric edema which may indicate recently passed stone. No left hydronephrosis. 4. Hepatosplenomegaly and hepatic steatosis. 5. Skin thickening and subcutaneous edema of the right  lower abdominal pannus, incompletely included in the field of view due to habitus. Recommend correlation for cellulitis. 6. Suspected noncalcified gallstones. 7. Mild sigmoid diverticulosis without diverticulitis. 8. L4-L5 disc space loss with endplate irregularity and sclerosis. This may be degenerative, however if there is clinical concern for discitis/osteomyelitis, recommend further evaluation with lumbar spine MRI. Aortic Atherosclerosis (ICD10-I70.0). Electronically Signed   By: Keith Rake M.D.   On: 05/08/2022 19:11        Scheduled Meds:  allopurinol  300 mg Oral Daily   aspirin EC  81 mg Oral Daily   buPROPion  300 mg Oral Daily   fentaNYL (SUBLIMAZE) injection  50 mcg Intravenous Once   insulin aspart  0-15 Units Subcutaneous TID WC   insulin aspart  0-5 Units Subcutaneous QHS   levothyroxine  125 mcg Oral Q0600   methylPREDNISolone (SOLU-MEDROL) injection  40 mg Intravenous Q12H   metoprolol tartrate  12.5 mg Oral BID   pantoprazole  40 mg Oral Daily   rosuvastatin  20 mg Oral Daily   sertraline  150 mg Oral Daily   Continuous Infusions:  ciprofloxacin 400 mg (05/10/22 0617)   lactated ringers 100 mL/hr at 05/10/22 1148     LOS: 0 days    Time spent: 40 minutes    Irine Seal, MD [facility]   To contact the attending provider between 7A-7P or the covering provider during after hours 7P-7A, please log into the web site [email or web address] and access using universal [place] password for that web site. If you do not have the password, please call the hospital operator.  05/10/2022, 2:08 PM

## 2022-05-10 NOTE — Progress Notes (Signed)
Spoke with urology office and they will page Dr Zenon Mayo about patients foley and discharge

## 2022-05-11 DIAGNOSIS — G4733 Obstructive sleep apnea (adult) (pediatric): Secondary | ICD-10-CM | POA: Diagnosis not present

## 2022-05-11 DIAGNOSIS — N200 Calculus of kidney: Secondary | ICD-10-CM | POA: Diagnosis not present

## 2022-05-11 DIAGNOSIS — J9612 Chronic respiratory failure with hypercapnia: Secondary | ICD-10-CM | POA: Diagnosis not present

## 2022-05-11 DIAGNOSIS — N12 Tubulo-interstitial nephritis, not specified as acute or chronic: Secondary | ICD-10-CM | POA: Diagnosis not present

## 2022-05-11 LAB — BASIC METABOLIC PANEL
Anion gap: 11 (ref 5–15)
BUN: 24 mg/dL — ABNORMAL HIGH (ref 6–20)
CO2: 23 mmol/L (ref 22–32)
Calcium: 8.7 mg/dL — ABNORMAL LOW (ref 8.9–10.3)
Chloride: 102 mmol/L (ref 98–111)
Creatinine, Ser: 1.08 mg/dL (ref 0.61–1.24)
GFR, Estimated: >60 mL/min (ref >60)
Glucose, Bld: 121 mg/dL — ABNORMAL HIGH (ref 70–99)
Potassium: 4.3 mmol/L (ref 3.5–5.1)
Sodium: 136 mmol/L (ref 135–145)

## 2022-05-11 LAB — CBC WITH DIFFERENTIAL/PLATELET
Abs Immature Granulocytes: 0.05 10*3/uL (ref 0.00–0.07)
Basophils Absolute: 0 10*3/uL (ref 0.0–0.1)
Basophils Relative: 0 %
Eosinophils Absolute: 0 10*3/uL (ref 0.0–0.5)
Eosinophils Relative: 0 %
HCT: 40 % (ref 39.0–52.0)
Hemoglobin: 13.2 g/dL (ref 13.0–17.0)
Immature Granulocytes: 1 %
Lymphocytes Relative: 7 %
Lymphs Abs: 0.5 10*3/uL — ABNORMAL LOW (ref 0.7–4.0)
MCH: 29.7 pg (ref 26.0–34.0)
MCHC: 33 g/dL (ref 30.0–36.0)
MCV: 90.1 fL (ref 80.0–100.0)
Monocytes Absolute: 0.3 10*3/uL (ref 0.1–1.0)
Monocytes Relative: 4 %
Neutro Abs: 6.3 10*3/uL (ref 1.7–7.7)
Neutrophils Relative %: 88 %
Platelets: 128 10*3/uL — ABNORMAL LOW (ref 150–400)
RBC: 4.44 MIL/uL (ref 4.22–5.81)
RDW: 12.7 % (ref 11.5–15.5)
WBC: 7.1 10*3/uL (ref 4.0–10.5)
nRBC: 0 % (ref 0.0–0.2)

## 2022-05-11 LAB — GLUCOSE, CAPILLARY
Glucose-Capillary: 112 mg/dL — ABNORMAL HIGH (ref 70–99)
Glucose-Capillary: 125 mg/dL — ABNORMAL HIGH (ref 70–99)
Glucose-Capillary: 125 mg/dL — ABNORMAL HIGH (ref 70–99)
Glucose-Capillary: 119 mg/dL — ABNORMAL HIGH (ref 70–99)

## 2022-05-11 LAB — MAGNESIUM: Magnesium: 2 mg/dL (ref 1.7–2.4)

## 2022-05-11 MED ORDER — ISOSORBIDE MONONITRATE ER 30 MG PO TB24
30.0000 mg | ORAL_TABLET | Freq: Every day | ORAL | Status: DC
  Administered 2022-05-11 – 2022-05-17 (×7): 30 mg via ORAL
  Filled 2022-05-11 (×7): qty 1

## 2022-05-11 MED ORDER — CHLORHEXIDINE GLUCONATE CLOTH 2 % EX PADS
6.0000 | MEDICATED_PAD | Freq: Every day | CUTANEOUS | Status: DC
  Administered 2022-05-11 – 2022-05-12 (×2): 6 via TOPICAL

## 2022-05-11 MED ORDER — TAMSULOSIN HCL 0.4 MG PO CAPS
0.4000 mg | ORAL_CAPSULE | Freq: Every day | ORAL | Status: DC
  Administered 2022-05-11 – 2022-05-17 (×7): 0.4 mg via ORAL
  Filled 2022-05-11 (×7): qty 1

## 2022-05-11 MED ORDER — CALCIUM CARBONATE ANTACID 500 MG PO CHEW: 200.0000 mg | CHEWABLE_TABLET | ORAL | Status: DC | PRN

## 2022-05-11 MED ORDER — SENNOSIDES-DOCUSATE SODIUM 8.6-50 MG PO TABS
1.0000 | ORAL_TABLET | Freq: Two times a day (BID) | ORAL | Status: DC
  Administered 2022-05-11 – 2022-05-12 (×2): 1 via ORAL
  Filled 2022-05-11 (×11): qty 1

## 2022-05-11 NOTE — Anesthesia Postprocedure Evaluation (Signed)
Anesthesia Post Note  Patient: Earsel Calcaterra  Procedure(s) Performed: CYSTOSCOPY WITH RETROGRADE PYELOGRAM AND STENT PLACEMENT (Left: Urethra)     Patient location during evaluation: PACU Anesthesia Type: General Level of consciousness: awake and alert Pain management: pain level controlled Vital Signs Assessment: post-procedure vital signs reviewed and stable Respiratory status: spontaneous breathing, nonlabored ventilation, respiratory function stable and patient connected to nasal cannula oxygen Cardiovascular status: blood pressure returned to baseline and stable Postop Assessment: no apparent nausea or vomiting Anesthetic complications: no  No notable events documented.  Last Vitals:  Vitals:   05/10/22 2029 05/11/22 0522  BP: (!) 140/98 (!) 158/88  Pulse: 63 60  Resp: 18 19  Temp: 36.5 C 36.5 C  SpO2: 91% 96%    Last Pain:  Vitals:   05/11/22 0522  TempSrc: Oral  PainSc:    Pain Goal: Patients Stated Pain Goal: 2 (05/10/22 2255)                 Towana Stenglein L Kaelie Henigan

## 2022-05-11 NOTE — Progress Notes (Signed)
PROGRESS NOTE    [name] [name]  ZOX:[id number] DOB: 1966/05/02 DOA: 05/09/2022 PCP: [name], MD    Chief Complaint  Patient presents with   Emesis   Urinary Tract Infection    Brief Narrative:  HPI per Dr. Claria Dice  This is a 56 year old male with past medical history of hypertension, CAD, depression and obstructive sleep apnea.  The patient presented to [place] with complaint of fever, nausea, weakness vomiting and abdominal pain for the past 4 days.  The patient states he was just really really tired and fatigued.  He had flank pain for months, his PCP ordered the Flomax and he thought he passed the stone however, the pain did not improve.  He was not on antibiotics.  He presented to [place].    UA done was positive for UTI, CT abdomen pelvis revealed 13 mm obstructing stone at right UPJ with associated mild hydronephrosis as well as several nonobstructing bilateral renal calculi. He also had left perinephric stranding secondary to likely recently passed stone.  He was sent to [facility] and taking immediately to the OR for stent placement.   Of note patient is on Plavix for stent placed in February this year.    Assessment & Plan:   Principal Problem:   Sepsis ([place]) Active Problems:   OSA (obstructive sleep apnea)   Pituitary adenoma (HCC)   Chronic respiratory failure with hypercapnia (HCC)   Morbid obesity (HCC)   Nephrolithiasis   Chronic diastolic heart failure (HCC)   Panhypopituitarism (HCC)   Physical deconditioning   Obesity hypoventilation syndrome (HCC)   Pyelonephritis   Kidney stone  #1 infected left urethral calculi/acute pyelonephritis -Patient noted to have presented with nausea, vomiting, abdominal pain, CVA tenderness, generalized fatigue.  Concern patient may have passed a stone. -Urinalysis done concerning for UTI.  CT abdomen and pelvis stone protocol revealed a 13 mm obstructing stone at the right UPJ with  associated mild hydronephrosis as well as several nonobstructing bilateral renal calculi.  Left perinephric stranding noted secondary to likely recently passed stone. -Patient transferred to [facility] long ED, seen by urology and taken immediately to the OR where patient underwent cystoscopy, left ureteral stent placement, left retrograde pyelography with interpretation. -Urine cultures pending.  Blood cultures pending.   -Continue empiric IV ciprofloxacin.   -Per urology.    2.  CAD -Stable. -Per admitting physician urology cleared resumption of patient's Plavix. -Blood pressure noted to be soft/borderline and as such Imdur held as well as metoprolol. -Metoprolol, aspirin, Plavix resumed.   -Resume home regimen Imdur. -Continue statin.  -Supportive care.  3.  Hypothyroidism -Continue Synthroid.   4.  Depression -Continue Zoloft.  5.  Hyperlipidemia -Continue statin.  6.  Chronic respiratory failure with hypercapnia/chronic diastolic heart failure/obesity hypoventilation syndrome -Continue Provigil. -CPAP.  7.  Pituitary adenoma/physical deconditioning/panhypopituitarism -Noted to have been on hydrocortisone prior to admission, currently on IV Solu-Medrol, as patient improves clinically would likely be transition back to home regimen hydrocortisone hopefully in the next day or 2.   8.  Morbid obesity -Lifestyle modification.   DVT prophylaxis: SCDs Code Status: Full Family Communication: Updated patient, no family at bedside. Disposition: Likely home when clinically improved.  Status is: Inpatient Remains inpatient appropriate because: Severity of illness   Consultants:  Urology: Dr. Louis Meckel 05/09/2022  Procedures:  CT renal stone protocol 05/09/2022, 05/08/2022 Cystoscopy/left ureteral stent placement/left retrograde pyelography with interpretation per urology: Dr. Louis Meckel 05/09/2022  Antimicrobials:  IV ciprofloxacin 05/09/2022>>>>   Subjective: Sitting  up in bed, RN  placing a new IV.  Patient denies any CVA tenderness.  Overall states he is feeling better.  States suprapubic abdominal pain improving.  Denies any significant CVA pain.  States penile pain is improving daily.    Objective: Vitals:   05/10/22 2029 05/11/22 0522 05/11/22 0901 05/11/22 1247  BP: (!) 140/98 (!) 158/88 (!) 124/90 130/79  Pulse: 63 60 65 65  Resp: '18 19 18 19  '$ Temp: 97.7 F (36.5 C) 97.7 F (36.5 C) 97.7 F (36.5 C) (!) 97.4 F (36.3 C)  TempSrc: Oral Oral Oral Oral  SpO2: 91% 96% 95% 99%  Weight:      Height:        Intake/Output Summary (Last 24 hours) at 05/11/2022 1339 Last data filed at 05/11/2022 0521 Gross per 24 hour  Intake 1745.99 ml  Output 580 ml  Net 1165.99 ml    Filed Weights   05/09/22 1713  Weight: (!) 180 kg    Examination:  General exam: NAD Respiratory system: Lungs clear to auscultation bilaterally.  No wheezes, no crackles, no rhonchi.  Fair air movement.  Speaking in full sentences.   Cardiovascular system: RRR no murmurs rubs or gallops.  No JVD.  No lower extremity edema. Gastrointestinal system: Abdomen is soft, nondistended, decreased tenderness to palpation in the suprapubic region.  Positive bowel sounds.  No significant left or right CVA tenderness.  Central nervous system: Alert and oriented. No focal neurological deficits. Extremities: Symmetric 5 x 5 power. Skin: No rashes, lesions or ulcers Psychiatry: Judgement and insight appear normal. Mood & affect appropriate.     Data Reviewed: I have personally reviewed following labs and imaging studies  CBC: Recent Labs  Lab 05/08/22 1839 05/08/22 1858 05/09/22 1723 05/10/22 0735 05/11/22 0912  WBC 9.6  --  6.6 3.5* 7.1  NEUTROABS 8.3*  --   --  3.1 6.3  HGB 13.2 12.9* 13.5 12.5* 13.2  HCT 37.2* 38.0* 39.6 37.4* 40.0  MCV 85.5  --  86.5 89.7 90.1  PLT 98*  --  103* 89* 128*     Basic Metabolic Panel: Recent Labs  Lab 05/08/22 1839 05/08/22 1858 05/09/22 1723  05/10/22 0735 05/11/22 0912  NA 131* 133* 133* 134* 136  K 3.4* 3.5 3.8 3.9 4.3  CL 97* 97* 95* 101 102  CO2 21*  --  21* 25 23  GLUCOSE 100* 99 98 157* 121*  BUN 14 13 21* 17 24*  CREATININE 1.51* 1.30* 1.39* 1.09 1.08  CALCIUM 8.2*  --  8.7* 8.1* 8.7*  MG  --   --   --   --  2.0     GFR: Estimated Creatinine Clearance: 125.1 mL/min (by C-G formula based on SCr of 1.08 mg/dL).  Liver Function Tests: Recent Labs  Lab 05/08/22 1839 05/09/22 1723  AST 19 49*  ALT 14 28  ALKPHOS 70 81  BILITOT 1.3* 1.1  PROT 6.4* 7.2  ALBUMIN 2.8* 3.8     CBG: Recent Labs  Lab 05/10/22 1133 05/10/22 1639 05/10/22 2104 05/11/22 0733 05/11/22 1153  GLUCAP 142* 131* 133* 112* 125*      Recent Results (from the past 240 hour(s))  Culture, blood (Routine X 2) w Reflex to ID Panel     Status: None (Preliminary result)   Collection Time: 05/10/22  2:05 AM   Specimen: BLOOD  Result Value Ref Range Status   Specimen Description   Final    BLOOD BLOOD LEFT HAND Performed  at [facility], [place] [address] [place], [place] [address]    Special Requests   Final    BOTTLES DRAWN AEROBIC ONLY Blood Culture adequate volume Performed at [facility] [address] [place], [place] [address]    Culture   Final    NO GROWTH 1 DAY Performed at [facility], [place] [address], [place], [place] [address]    Report Status PENDING  Incomplete  Culture, blood (Routine X 2) w Reflex to ID Panel     Status: None (Preliminary result)   Collection Time: 05/10/22  2:05 AM   Specimen: BLOOD  Result Value Ref Range Status   Specimen Description   Final    BLOOD BLOOD RIGHT HAND Performed at [facility] [address], [place], [place] [address]    Special Requests   Final    BOTTLES DRAWN AEROBIC ONLY Blood Culture results may not be optimal due to an inadequate volume of blood received in culture bottles Performed at [facility] [address], [place], [place] [address]    Culture   Final    NO GROWTH 1 DAY Performed at [facility], [place] [address] [place], [place] [address]    Report Status PENDING  Incomplete         Radiology Studies: DG C-Arm 1-60 Min-No Report  Result Date: 05/09/2022 Fluoroscopy was utilized by the requesting physician.  No radiographic interpretation.   CT Renal Stone Study  Result Date: 05/09/2022 CLINICAL DATA:  Abdominal pain.  Concern for kidney stone. EXAM: CT ABDOMEN AND PELVIS WITHOUT CONTRAST TECHNIQUE: Multidetector CT imaging of the abdomen and pelvis was performed following the standard protocol without IV contrast. RADIATION DOSE REDUCTION: This exam was performed according to the departmental dose-optimization program which includes automated exposure control, adjustment of the mA and/or kV according to patient size and/or use of iterative reconstruction technique. COMPARISON:  CT abdomen pelvis dated 05/08/2022. FINDINGS: Evaluation of this exam is limited in the absence of intravenous contrast. Lower chest: The visualized lung bases are clear. Advanced coronary vascular calcification of the LAD. No intra-abdominal free air or free fluid. Hepatobiliary: Fatty liver. No biliary dilatation. Probable noncalcified gallstone in the proximal gallbladder versus a tumefactive sludge. No pericholecystic fluid or evidence of acute cholecystitis by CT. Pancreas: Unremarkable. No pancreatic ductal dilatation or surrounding inflammatory changes. Spleen: Splenomegaly measuring 18 cm in craniocaudal length. Adrenals/Urinary Tract: The adrenal glands are unremarkable. Similar positioning of a 13 mm obstructing stone at the right UPJ. There is mild right hydronephrosis. Several additional nonobstructing bilateral renal calculi measure up to 11 mm in the inferior pole of the left kidney. There is no hydronephrosis on the left. Small right renal interpolar cyst. Additional  subcentimeter right renal inferior pole hypodense lesion which is too small to characterize. No imaging follow-up. Mild left perinephric stranding, nonspecific and may be related to recently passed stone. Correlation with urinalysis recommended to exclude UTI. The visualized ureters and urinary bladder appear unremarkable. Stomach/Bowel: Postsurgical changes of the rectosigmoid with anastomotic suture. Several small scattered sigmoid diverticula without active inflammatory changes. There is a small hiatal hernia. There is no bowel obstruction or active inflammation. The appendix is normal. Vascular/Lymphatic: Mild aortoiliac atherosclerotic disease. The IVC is unremarkable. No portal venous gas. There is no adenopathy. Reproductive: The prostate gland is small. Other: Small fat containing left inguinal hernia. Partially visualized skin thickening and induration of the subcutaneous soft tissues of  the right side of the pannus may represent cellulitis. No drainable fluid collection/abscess identified. Fatty atrophy of the distal right rectus muscle. Musculoskeletal: Degenerative changes of the spine. L4-L5 endplate irregularity and disc space narrowing, likely degenerative. An infectious process is less likely but not excluded clinical correlation is recommended. No acute osseous pathology. Old right rib fractures. IMPRESSION: 1. Similar positioning of a 13 mm obstructing stone at the right UPJ with mild right hydronephrosis. Several additional nonobstructing bilateral renal calculi. 2. Mild left perinephric stranding, nonspecific and may be related to recently passed stone. Correlation with urinalysis recommended to exclude UTI. 3. Fatty liver. 4. Splenomegaly. 5. Colonic diverticulosis. No bowel obstruction. Normal appendix. 6.  Aortic Atherosclerosis (ICD10-I70.0). Electronically Signed   By: Anner Crete M.D.   On: 05/09/2022 19:33        Scheduled Meds:  allopurinol  300 mg Oral Daily   aspirin EC  81  mg Oral Daily   buPROPion  300 mg Oral Daily   Chlorhexidine Gluconate Cloth  6 each Topical Daily   clopidogrel  75 mg Oral Q breakfast   diclofenac  75 mg Oral BID   And   misoprostol  200 mcg Oral BID   fentaNYL (SUBLIMAZE) injection  50 mcg Intravenous Once   insulin aspart  0-15 Units Subcutaneous TID WC   insulin aspart  0-5 Units Subcutaneous QHS   isosorbide mononitrate  30 mg Oral Daily   levothyroxine  125 mcg Oral Q0600   methylPREDNISolone (SOLU-MEDROL) injection  40 mg Intravenous Q12H   metoprolol tartrate  12.5 mg Oral BID   pantoprazole  40 mg Oral Daily   rosuvastatin  20 mg Oral Daily   senna-docusate  1 tablet Oral BID   sertraline  150 mg Oral Daily   tamsulosin  0.4 mg Oral Daily   Continuous Infusions:  ciprofloxacin 400 mg (05/11/22 0536)   lactated ringers 75 mL/hr at 05/11/22 1218     LOS: 1 day    Time spent: 40 minutes    Irine Seal, MD [facility]   To contact the attending provider between 7A-7P or the covering provider during after hours 7P-7A, please log into the web site [email or web address] and access using universal [place] password for that web site. If you do not have the password, please call the hospital operator.  05/11/2022, 1:39 PM

## 2022-05-12 DIAGNOSIS — N200 Calculus of kidney: Secondary | ICD-10-CM | POA: Diagnosis not present

## 2022-05-12 DIAGNOSIS — G4733 Obstructive sleep apnea (adult) (pediatric): Secondary | ICD-10-CM | POA: Diagnosis not present

## 2022-05-12 DIAGNOSIS — J9612 Chronic respiratory failure with hypercapnia: Secondary | ICD-10-CM | POA: Diagnosis not present

## 2022-05-12 DIAGNOSIS — N12 Tubulo-interstitial nephritis, not specified as acute or chronic: Secondary | ICD-10-CM | POA: Diagnosis not present

## 2022-05-12 LAB — BASIC METABOLIC PANEL
Anion gap: 7 (ref 5–15)
BUN: 28 mg/dL — ABNORMAL HIGH (ref 6–20)
CO2: 27 mmol/L (ref 22–32)
Calcium: 8.4 mg/dL — ABNORMAL LOW (ref 8.9–10.3)
Chloride: 97 mmol/L — ABNORMAL LOW (ref 98–111)
Creatinine, Ser: 1.08 mg/dL (ref 0.61–1.24)
GFR, Estimated: >60 mL/min (ref >60)
Glucose, Bld: 120 mg/dL — ABNORMAL HIGH (ref 70–99)
Potassium: 3.9 mmol/L (ref 3.5–5.1)
Sodium: 131 mmol/L — ABNORMAL LOW (ref 135–145)

## 2022-05-12 LAB — CBC
HCT: 38 % — ABNORMAL LOW (ref 39.0–52.0)
Hemoglobin: 12.5 g/dL — ABNORMAL LOW (ref 13.0–17.0)
MCH: 29.6 pg (ref 26.0–34.0)
MCHC: 32.9 g/dL (ref 30.0–36.0)
MCV: 89.8 fL (ref 80.0–100.0)
Platelets: 140 10*3/uL — ABNORMAL LOW (ref 150–400)
RBC: 4.23 MIL/uL (ref 4.22–5.81)
RDW: 12.7 % (ref 11.5–15.5)
WBC: 5.9 10*3/uL (ref 4.0–10.5)
nRBC: 0 % (ref 0.0–0.2)

## 2022-05-12 LAB — GLUCOSE, CAPILLARY
Glucose-Capillary: 102 mg/dL — ABNORMAL HIGH (ref 70–99)
Glucose-Capillary: 104 mg/dL — ABNORMAL HIGH (ref 70–99)
Glucose-Capillary: 138 mg/dL — ABNORMAL HIGH (ref 70–99)
Glucose-Capillary: 115 mg/dL — ABNORMAL HIGH (ref 70–99)

## 2022-05-12 LAB — HEMOGLOBIN A1C
Hgb A1c MFr Bld: 5.2 % (ref 4.8–5.6)
Mean Plasma Glucose: 103 mg/dL

## 2022-05-12 LAB — MAGNESIUM: Magnesium: 2.2 mg/dL (ref 1.7–2.4)

## 2022-05-12 MED ORDER — HYDROCORTISONE 20 MG PO TABS
20.0000 mg | ORAL_TABLET | Freq: Every day | ORAL | Status: DC
  Administered 2022-05-13 – 2022-05-17 (×5): 20 mg via ORAL
  Filled 2022-05-12 (×5): qty 1

## 2022-05-12 MED ORDER — METHYLPREDNISOLONE SODIUM SUCC 40 MG IJ SOLR
40.0000 mg | Freq: Every day | INTRAMUSCULAR | Status: DC
  Administered 2022-05-12: 40 mg via INTRAVENOUS
  Filled 2022-05-12: qty 1

## 2022-05-12 NOTE — Progress Notes (Signed)
Spoke with Dr Louis Meckel Urology and informed him that patient wanted to speak with him; asked if foley should be removed; Dr Louis Meckel stated that he would be in later this afternoon to see patient and to remove the foley

## 2022-05-12 NOTE — Progress Notes (Addendum)
Per patient request, paged out to urology to come speak with patient about procedure and plan; patient states that he hasn't seen the urologist since his procedure and would like to speak with him; paged Dr Alinda Money on call. Dr Louis Meckel in surgery

## 2022-05-12 NOTE — Progress Notes (Signed)
PROGRESS NOTE    [name] [name]  YYQ:[id number] DOB: [birth year] DOA: 05/09/2022 PCP: [name], MD    Chief Complaint  Patient presents with   Emesis   Urinary Tract Infection    Brief Narrative:  HPI per Dr. Claria Dice  This is a 56 year old male with past medical history of hypertension, CAD, depression and obstructive sleep apnea.  The patient presented to [place] with complaint of fever, nausea, weakness vomiting and abdominal pain for the past 4 days.  The patient states he was just really really tired and fatigued.  He had flank pain for months, his PCP ordered the Flomax and he thought he passed the stone however, the pain did not improve.  He was not on antibiotics.  He presented to [place].    UA done was positive for UTI, CT abdomen pelvis revealed 13 mm obstructing stone at right UPJ with associated mild hydronephrosis as well as several nonobstructing bilateral renal calculi. He also had left perinephric stranding secondary to likely recently passed stone.  He was sent to [facility] and taking immediately to the OR for stent placement.   Of note patient is on Plavix for stent placed in February this year.    Assessment & Plan:   Principal Problem:   Sepsis ([place]) Active Problems:   OSA (obstructive sleep apnea)   Pituitary adenoma (HCC)   Chronic respiratory failure with hypercapnia (HCC)   Morbid obesity (HCC)   Nephrolithiasis   Chronic diastolic heart failure (HCC)   Panhypopituitarism (HCC)   Physical deconditioning   Obesity hypoventilation syndrome (HCC)   Pyelonephritis   Kidney stone  #1 infected left urethral calculi/acute pyelonephritis -Patient noted to have presented with nausea, vomiting, abdominal pain, CVA tenderness, generalized fatigue.  Concern patient may have passed a stone. -Urinalysis done concerning for UTI.  CT abdomen and pelvis stone protocol revealed a 13 mm obstructing stone at the right UPJ with  associated mild hydronephrosis as well as several nonobstructing bilateral renal calculi.  Left perinephric stranding noted secondary to likely recently passed stone. -Patient transferred to [facility] long ED, seen by urology and taken immediately to the OR where patient underwent cystoscopy, left ureteral stent placement, left retrograde pyelography with interpretation. -Urine cultures pending.  Blood cultures pending.   -Continue empiric IV ciprofloxacin and likely transition to oral ciprofloxacin tomorrow to complete a 10 to 14-day course of treatment. -Foley catheter discontinued per urology today..   -Per urology.    2.  CAD -Stable. -Per admitting physician urology cleared resumption of patient's Plavix. -Blood pressure noted to be soft/borderline and as such Imdur held as well as metoprolol on admission. -Metoprolol, aspirin, Plavix, Imdur resumed.  -Continue statin.  -Supportive care.  3.  Hypothyroidism -Synthroid.   4.  Depression -Zoloft.   5.  Hyperlipidemia -Statin.  6.  Chronic respiratory failure with hypercapnia/chronic diastolic heart failure/obesity hypoventilation syndrome -Continue Provigil. -CPAP.  7.  Pituitary adenoma/physical deconditioning/panhypopituitarism -Noted to have been on hydrocortisone prior to admission, currently on IV Solu-Medrol.  Decrease IV Solu-Medrol to daily and transition back to home regimen hydrocortisone tomorrow.  8.  Morbid obesity -Lifestyle modification.   DVT prophylaxis: SCDs Code Status: Full Family Communication: Updated patient, no family at bedside. Disposition: Likely home when clinically improved and cleared by urology hopefully in the next 24 to 48 hours..  Status is: Inpatient Remains inpatient appropriate because: Severity of illness   Consultants:  Urology: Dr. Louis Meckel 05/09/2022  Procedures:  CT renal stone protocol 05/09/2022, 05/08/2022  Cystoscopy/left ureteral stent placement/left retrograde pyelography with  interpretation per urology: Dr. Louis Meckel 05/09/2022  Antimicrobials:  IV ciprofloxacin 05/09/2022>>>>   Subjective: Sitting up in bed.  Overall feeling much better.  Burning/pain in penile region improved after Foley catheter has been discontinued per patient.  Denies any CVA tenderness.  No chest pain.  No shortness of breath.  Overall feels much better than on admission.    Objective: Vitals:   05/11/22 0901 05/11/22 1247 05/11/22 2206 05/12/22 0616  BP: (!) 124/90 130/79 (!) 141/91 114/72  Pulse: 65 65 66 65  Resp: '18 19  17  '$ Temp: 97.7 F (36.5 C) (!) 97.4 F (36.3 C)  98.3 F (36.8 C)  TempSrc: Oral Oral  Oral  SpO2: 95% 99%  98%  Weight:      Height:        Intake/Output Summary (Last 24 hours) at 05/12/2022 1331 Last data filed at 05/12/2022 1245 Gross per 24 hour  Intake 360 ml  Output 4250 ml  Net -3890 ml    Filed Weights   05/09/22 1713  Weight: (!) 180 kg    Examination:  General exam: NAD Respiratory system: CTA B.  No wheezes, no crackles, no rhonchi.  Fair air movement.  Speaking in full sentences.  Cardiovascular system: Regular rate rhythm no murmurs rubs or gallops.  No JVD.  No lower extremity edema.  Gastrointestinal system: Abdomen is obese, soft, nontender, nondistended, positive bowel sounds.  No significant right or left CVA TTP.  Central nervous system: Alert and oriented. No focal neurological deficits. Extremities: Symmetric 5 x 5 power. Skin: No rashes, lesions or ulcers Psychiatry: Judgement and insight appear normal. Mood & affect appropriate.     Data Reviewed: I have personally reviewed following labs and imaging studies  CBC: Recent Labs  Lab 05/08/22 1839 05/08/22 1858 05/09/22 1723 05/10/22 0735 05/11/22 0912 05/12/22 0522  WBC 9.6  --  6.6 3.5* 7.1 5.9  NEUTROABS 8.3*  --   --  3.1 6.3  --   HGB 13.2 12.9* 13.5 12.5* 13.2 12.5*  HCT 37.2* 38.0* 39.6 37.4* 40.0 38.0*  MCV 85.5  --  86.5 89.7 90.1 89.8  PLT 98*  --  103*  89* 128* 140*     Basic Metabolic Panel: Recent Labs  Lab 05/08/22 1839 05/08/22 1858 05/09/22 1723 05/10/22 0735 05/11/22 0912 05/12/22 0522  NA 131* 133* 133* 134* 136 131*  K 3.4* 3.5 3.8 3.9 4.3 3.9  CL 97* 97* 95* 101 102 97*  CO2 21*  --  21* '25 23 27  '$ GLUCOSE 100* 99 98 157* 121* 120*  BUN 14 13 21* 17 24* 28*  CREATININE 1.51* 1.30* 1.39* 1.09 1.08 1.08  CALCIUM 8.2*  --  8.7* 8.1* 8.7* 8.4*  MG  --   --   --   --  2.0 2.2     GFR: Estimated Creatinine Clearance: 125.1 mL/min (by C-G formula based on SCr of 1.08 mg/dL).  Liver Function Tests: Recent Labs  Lab 05/08/22 1839 05/09/22 1723  AST 19 49*  ALT 14 28  ALKPHOS 70 81  BILITOT 1.3* 1.1  PROT 6.4* 7.2  ALBUMIN 2.8* 3.8     CBG: Recent Labs  Lab 05/11/22 1153 05/11/22 1610 05/11/22 2131 05/12/22 0747 05/12/22 1124  GLUCAP 125* 125* 119* 102* 104*      Recent Results (from the past 240 hour(s))  Culture, blood (Routine X 2) w Reflex to ID Panel     Status:  None (Preliminary result)   Collection Time: 05/10/22  2:05 AM   Specimen: BLOOD  Result Value Ref Range Status   Specimen Description   Final    BLOOD BLOOD LEFT HAND Performed at [facility] [address] [place], [place] [address]    Special Requests   Final    BOTTLES DRAWN AEROBIC ONLY Blood Culture adequate volume Performed at [facility] [address], [place], [place] [address]    Culture   Final    NO GROWTH 2 DAYS Performed at [facility] [address], [place], [place] [address]    Report Status PENDING  Incomplete  Culture, blood (Routine X 2) w Reflex to ID Panel     Status: None (Preliminary result)   Collection Time: 05/10/22  2:05 AM   Specimen: BLOOD  Result Value Ref Range Status   Specimen Description   Final    BLOOD BLOOD RIGHT HAND Performed at [facility] [address] [place], [place] [address]    Special Requests   Final     BOTTLES DRAWN AEROBIC ONLY Blood Culture results may not be optimal due to an inadequate volume of blood received in culture bottles Performed at [facility] [address] [place], [place] [address]    Culture   Final    NO GROWTH 2 DAYS Performed at [facility] [address], [place],  [address]    Report Status PENDING  Incomplete         Radiology Studies: No results found.      Scheduled Meds:  allopurinol  300 mg Oral Daily   aspirin EC  81 mg Oral Daily   buPROPion  300 mg Oral Daily   Chlorhexidine Gluconate Cloth  6 each Topical Daily   clopidogrel  75 mg Oral Q breakfast   diclofenac  75 mg Oral BID   And   misoprostol  200 mcg Oral BID   fentaNYL (SUBLIMAZE) injection  50 mcg Intravenous Once   insulin aspart  0-15 Units Subcutaneous TID WC   insulin aspart  0-5 Units Subcutaneous QHS   isosorbide mononitrate  30 mg Oral Daily   levothyroxine  125 mcg Oral Q0600   methylPREDNISolone (SOLU-MEDROL) injection  40 mg Intravenous Daily   metoprolol tartrate  12.5 mg Oral BID   pantoprazole  40 mg Oral Daily   rosuvastatin  20 mg Oral Daily   senna-docusate  1 tablet Oral BID   sertraline  150 mg Oral Daily   tamsulosin  0.4 mg Oral Daily   Continuous Infusions:  ciprofloxacin 400 mg (05/12/22 0503)   lactated ringers 75 mL/hr at 05/12/22 0504     LOS: 2 days    Time spent: 40 minutes    Irine Seal, MD [facility]   To contact the attending provider between 7A-7P or the covering provider during after hours 7P-7A, please log into the web site [email or web address] and access using universal [place] password for that web site. If you do not have the password, please call the hospital operator.  05/12/2022, 1:31 PM

## 2022-05-12 NOTE — Progress Notes (Addendum)
PT Cancellation Note  Patient Details Name: [name] [name] MRN: [id number] DOB: 1966-03-23   Cancelled Treatment:    Reason Eval/Treat Not Completed: PT screened, no needs identified, will sign off;Patient declined, no reason specified. Pt reports living alone, able to transfer to electric w/c without assist, ind with bathing, cooking, drives handicap Lucianne Lei, mobilizes around independently in electric w/c. Pt rolling and repositioning in bed without assist as needed while therapist in room. Pt politely declines PT eval, will sign off at this time.    Tori Jacorey Donaway PT, DPT 05/12/22, 10:05 AM

## 2022-05-12 NOTE — Progress Notes (Signed)
Patient refuses to get into chair using lyft without his personal electric wheelchair; notified MD

## 2022-05-13 DIAGNOSIS — J9612 Chronic respiratory failure with hypercapnia: Secondary | ICD-10-CM | POA: Diagnosis not present

## 2022-05-13 DIAGNOSIS — G4733 Obstructive sleep apnea (adult) (pediatric): Secondary | ICD-10-CM | POA: Diagnosis not present

## 2022-05-13 DIAGNOSIS — N12 Tubulo-interstitial nephritis, not specified as acute or chronic: Secondary | ICD-10-CM | POA: Diagnosis not present

## 2022-05-13 DIAGNOSIS — N200 Calculus of kidney: Secondary | ICD-10-CM | POA: Diagnosis not present

## 2022-05-13 LAB — BASIC METABOLIC PANEL
Anion gap: 5 (ref 5–15)
BUN: 32 mg/dL — ABNORMAL HIGH (ref 6–20)
CO2: 29 mmol/L (ref 22–32)
Calcium: 8.2 mg/dL — ABNORMAL LOW (ref 8.9–10.3)
Chloride: 104 mmol/L (ref 98–111)
Creatinine, Ser: 1.09 mg/dL (ref 0.61–1.24)
GFR, Estimated: >60 mL/min (ref >60)
Glucose, Bld: 87 mg/dL (ref 70–99)
Potassium: 3.3 mmol/L — ABNORMAL LOW (ref 3.5–5.1)
Sodium: 138 mmol/L (ref 135–145)

## 2022-05-13 LAB — CBC
HCT: 35.2 % — ABNORMAL LOW (ref 39.0–52.0)
Hemoglobin: 11.5 g/dL — ABNORMAL LOW (ref 13.0–17.0)
MCH: 29.3 pg (ref 26.0–34.0)
MCHC: 32.7 g/dL (ref 30.0–36.0)
MCV: 89.6 fL (ref 80.0–100.0)
Platelets: 125 10*3/uL — ABNORMAL LOW (ref 150–400)
RBC: 3.93 MIL/uL — ABNORMAL LOW (ref 4.22–5.81)
RDW: 12.6 % (ref 11.5–15.5)
WBC: 5.4 10*3/uL (ref 4.0–10.5)
nRBC: 0 % (ref 0.0–0.2)

## 2022-05-13 LAB — GLUCOSE, CAPILLARY
Glucose-Capillary: 89 mg/dL (ref 70–99)
Glucose-Capillary: 87 mg/dL (ref 70–99)
Glucose-Capillary: 103 mg/dL — ABNORMAL HIGH (ref 70–99)
Glucose-Capillary: 93 mg/dL (ref 70–99)

## 2022-05-13 MED ORDER — POTASSIUM CHLORIDE CRYS ER 10 MEQ PO TBCR
40.0000 meq | EXTENDED_RELEASE_TABLET | Freq: Once | ORAL | Status: AC
  Administered 2022-05-13: 40 meq via ORAL
  Filled 2022-05-13: qty 4

## 2022-05-13 MED ORDER — CIPROFLOXACIN HCL 500 MG PO TABS: 500.0000 mg | ORAL_TABLET | Freq: Two times a day (BID) | ORAL | Status: DC

## 2022-05-13 MED ORDER — CIPROFLOXACIN HCL 500 MG PO TABS
500.0000 mg | ORAL_TABLET | Freq: Two times a day (BID) | ORAL | Status: DC
  Administered 2022-05-13 – 2022-05-17 (×8): 500 mg via ORAL
  Filled 2022-05-13 (×8): qty 1

## 2022-05-13 NOTE — Progress Notes (Addendum)
PROGRESS NOTE    [name] [name]  FHL:[id number] DOB: [birth year] DOA: 05/09/2022 PCP: [name], MD    Chief Complaint  Patient presents with   Emesis   Urinary Tract Infection    Brief Narrative:  HPI per Dr. Claria Dice  This is a 56 year old male with past medical history of hypertension, CAD, depression and obstructive sleep apnea.  The patient presented to [place] with complaint of fever, nausea, weakness vomiting and abdominal pain for the past 4 days.  The patient states he was just really really tired and fatigued.  He had flank pain for months, his PCP ordered the Flomax and he thought he passed the stone however, the pain did not improve.  He was not on antibiotics.  He presented to [facility].    UA done was positive for UTI, CT abdomen pelvis revealed 13 mm obstructing stone at right UPJ with associated mild hydronephrosis as well as several nonobstructing bilateral renal calculi. He also had left perinephric stranding secondary to likely recently passed stone.  He was sent to [facility] and taking immediately to the OR for stent placement.   Of note patient is on Plavix for stent placed in February this year.    Assessment & Plan:   Principal Problem:   Sepsis ([place]) Active Problems:   OSA (obstructive sleep apnea)   Pituitary adenoma (HCC)   Chronic respiratory failure with hypercapnia (HCC)   Morbid obesity (HCC)   Nephrolithiasis   Chronic diastolic heart failure (HCC)   Panhypopituitarism (HCC)   Physical deconditioning   Obesity hypoventilation syndrome (HCC)   Pyelonephritis   Kidney stone  #1 infected left urethral calculi/acute pyelonephritis -Patient noted to have presented with nausea, vomiting, abdominal pain, CVA tenderness, generalized fatigue.  Concern patient may have passed a stone. -Urinalysis done concerning for UTI.  CT abdomen and pelvis stone protocol revealed a 13 mm obstructing stone at the right UPJ with  associated mild hydronephrosis as well as several nonobstructing bilateral renal calculi.  Left perinephric stranding noted secondary to likely recently passed stone. -Patient transferred to [facility] long ED, seen by urology and taken immediately to the OR where patient underwent cystoscopy, left ureteral stent placement, left retrograde pyelography with interpretation. -Urine cultures pending.  Blood cultures pending with no growth to date..   -Patient noted to be on IV ciprofloxacin will transition to oral ciprofloxacin for 10 more days to complete a 14-day course of antibiotic treatment.   -Foley catheter discontinued 05/12/2022, patient states good urine output.   -Per urology.   2.  CAD -Stable. -Per admitting physician urology cleared resumption of patient's Plavix. -Blood pressure noted to be soft/borderline and as such Imdur held as well as metoprolol on admission. -Metoprolol, aspirin, Plavix, Imdur resumed.  -Continue statin.  -Supportive care.  3.  Hypothyroidism -Continue Synthroid.   4.  Depression -Continue Zoloft.   5.  Hyperlipidemia -Continue statin.  6.  Chronic respiratory failure with hypercapnia/chronic diastolic heart failure/obesity hypoventilation syndrome -Continue Provigil. -CPAP.  7.  Pituitary adenoma/physical deconditioning/panhypopituitarism -Noted to have been on hydrocortisone prior to admission, initially placed on IV Solu-Medrol on admission and will transition back to home dose of oral hydrocortisone today.  8.  Hypokalemia -Replete.  9.  Morbid obesity -Lifestyle modification.   DVT prophylaxis: SCDs Code Status: Full Family Communication: Updated patient, no family at bedside. Disposition: Likely home when clinically improved and cleared by urology hopefully in the next 24 hours..  Status is: Inpatient Remains inpatient appropriate because: Severity  of illness   Consultants:  Urology: Dr. Louis Meckel 05/09/2022  Procedures:  CT renal  stone protocol 05/09/2022, 05/08/2022 Cystoscopy/left ureteral stent placement/left retrograde pyelography with interpretation per urology: Dr. Louis Meckel 05/09/2022  Antimicrobials:  IV ciprofloxacin 05/09/2022>>>> 05/13/2022 Oral ciprofloxacin 05/13/2022>>> 05/23/2022   Subjective: Sitting up in bed.  Flank pain improved significantly.  Lower abdominal pain improving.  Stated able to void after Foley catheter was removed.  No chest pain.  No shortness of breath.  Very appreciative of the care he is receiving.   Objective: Vitals:   05/12/22 2054 05/13/22 0608 05/13/22 1026 05/13/22 1256  BP: (!) 104/58 102/67 113/68 109/78  Pulse: 66 61 66 64  Resp: '18 20 18 16  '$ Temp: 97.7 F (36.5 C) 98 F (36.7 C) 98.5 F (36.9 C) 97.7 F (36.5 C)  TempSrc: Oral  Oral Oral  SpO2: 98% 97% 99% 97%  Weight:      Height:        Intake/Output Summary (Last 24 hours) at 05/13/2022 1311 Last data filed at 05/13/2022 1301 Gross per 24 hour  Intake 5043.53 ml  Output 2000 ml  Net 3043.53 ml    Filed Weights   05/09/22 1713  Weight: (!) 180 kg    Examination:  General exam: NAD Respiratory system: Lungs clear to auscultation bilaterally.  No wheezes, no crackles, no rhonchi.  Fair air movement.  Speaking in full sentences. Cardiovascular system: RRR no murmurs rubs or gallops.  No JVD.  No lower extremity edema.  Gastrointestinal system: Abdomen is obese, soft, nontender, nondistended, positive bowel sounds.  No significant left CVA TTP. Central nervous system: Alert and oriented. No focal neurological deficits. Extremities: Symmetric 5 x 5 power. Skin: No rashes, lesions or ulcers Psychiatry: Judgement and insight appear normal. Mood & affect appropriate.     Data Reviewed: I have personally reviewed following labs and imaging studies  CBC: Recent Labs  Lab 05/08/22 1839 05/08/22 1858 05/09/22 1723 05/10/22 0735 05/11/22 0912 05/12/22 0522 05/13/22 0552  WBC 9.6  --  6.6 3.5* 7.1 5.9  5.4  NEUTROABS 8.3*  --   --  3.1 6.3  --   --   HGB 13.2   < > 13.5 12.5* 13.2 12.5* 11.5*  HCT 37.2*   < > 39.6 37.4* 40.0 38.0* 35.2*  MCV 85.5  --  86.5 89.7 90.1 89.8 89.6  PLT 98*  --  103* 89* 128* 140* 125*   < > = values in this interval not displayed.     Basic Metabolic Panel: Recent Labs  Lab 05/09/22 1723 05/10/22 0735 05/11/22 0912 05/12/22 0522 05/13/22 0552  NA 133* 134* 136 131* 138  K 3.8 3.9 4.3 3.9 3.3*  CL 95* 101 102 97* 104  CO2 21* '25 23 27 29  '$ GLUCOSE 98 157* 121* 120* 87  BUN 21* 17 24* 28* 32*  CREATININE 1.39* 1.09 1.08 1.08 1.09  CALCIUM 8.7* 8.1* 8.7* 8.4* 8.2*  MG  --   --  2.0 2.2  --      GFR: Estimated Creatinine Clearance: 123.9 mL/min (by C-G formula based on SCr of 1.09 mg/dL).  Liver Function Tests: Recent Labs  Lab 05/08/22 1839 05/09/22 1723  AST 19 49*  ALT 14 28  ALKPHOS 70 81  BILITOT 1.3* 1.1  PROT 6.4* 7.2  ALBUMIN 2.8* 3.8     CBG: Recent Labs  Lab 05/12/22 1124 05/12/22 1658 05/12/22 2051 05/13/22 0749 05/13/22 1140  GLUCAP 104* 138* 115* 89 87  Recent Results (from the past 240 hour(s))  Culture, blood (Routine X 2) w Reflex to ID Panel     Status: None (Preliminary result)   Collection Time: 05/10/22  2:05 AM   Specimen: BLOOD  Result Value Ref Range Status   Specimen Description   Final    BLOOD BLOOD LEFT HAND Performed at [facility] [address] [place], [place] [address]    Special Requests   Final    BOTTLES DRAWN AEROBIC ONLY Blood Culture adequate volume Performed at [facility] [address] [place], [place] [address]    Culture   Final    NO GROWTH 3 DAYS Performed at [facility], [place] [address], [place], [place] [address]    Report Status PENDING  Incomplete  Culture, blood (Routine X 2) w Reflex to ID Panel     Status: None (Preliminary result)   Collection Time: 05/10/22  2:05 AM   Specimen: BLOOD  Result Value Ref  Range Status   Specimen Description   Final    BLOOD BLOOD RIGHT HAND Performed at [facility] [address], [place], [place] [address]    Special Requests   Final    BOTTLES DRAWN AEROBIC ONLY Blood Culture results may not be optimal due to an inadequate volume of blood received in culture bottles Performed at [facility] [address] [place], [place] [address]    Culture   Final    NO GROWTH 3 DAYS Performed at [facility], [place] [address], [place], [place] [address]    Report Status PENDING  Incomplete         Radiology Studies: No results found.      Scheduled Meds:  allopurinol  300 mg Oral Daily   aspirin EC  81 mg Oral Daily   buPROPion  300 mg Oral Daily   Chlorhexidine Gluconate Cloth  6 each Topical Daily   ciprofloxacin  500 mg Oral BID   clopidogrel  75 mg Oral Q breakfast   diclofenac  75 mg Oral BID   And   misoprostol  200 mcg Oral BID   fentaNYL (SUBLIMAZE) injection  50 mcg Intravenous Once   hydrocortisone  20 mg Oral Daily   insulin aspart  0-15 Units Subcutaneous TID WC   insulin aspart  0-5 Units Subcutaneous QHS   isosorbide mononitrate  30 mg Oral Daily   levothyroxine  125 mcg Oral Q0600   metoprolol tartrate  12.5 mg Oral BID   pantoprazole  40 mg Oral Daily   rosuvastatin  20 mg Oral Daily   senna-docusate  1 tablet Oral BID   sertraline  150 mg Oral Daily   tamsulosin  0.4 mg Oral Daily   Continuous Infusions:  lactated ringers 75 mL/hr at 05/13/22 1259     LOS: 3 days    Time spent: 40 minutes    Irine Seal, MD [facility]   To contact the attending provider between 7A-7P or the covering provider during after hours 7P-7A, please log into the web site [email or web address] and access using universal [place] password for that web site. If you do not have the password, please call the hospital operator.  05/13/2022, 1:11 PM

## 2022-05-14 DIAGNOSIS — J9612 Chronic respiratory failure with hypercapnia: Secondary | ICD-10-CM | POA: Diagnosis not present

## 2022-05-14 DIAGNOSIS — N12 Tubulo-interstitial nephritis, not specified as acute or chronic: Secondary | ICD-10-CM | POA: Diagnosis not present

## 2022-05-14 DIAGNOSIS — G4733 Obstructive sleep apnea (adult) (pediatric): Secondary | ICD-10-CM | POA: Diagnosis not present

## 2022-05-14 DIAGNOSIS — N200 Calculus of kidney: Secondary | ICD-10-CM | POA: Diagnosis not present

## 2022-05-14 LAB — CBC
HCT: 35.8 % — ABNORMAL LOW (ref 39.0–52.0)
Hemoglobin: 11.6 g/dL — ABNORMAL LOW (ref 13.0–17.0)
MCH: 29.9 pg (ref 26.0–34.0)
MCHC: 32.4 g/dL (ref 30.0–36.0)
MCV: 92.3 fL (ref 80.0–100.0)
Platelets: 142 10*3/uL — ABNORMAL LOW (ref 150–400)
RBC: 3.88 MIL/uL — ABNORMAL LOW (ref 4.22–5.81)
RDW: 13.2 % (ref 11.5–15.5)
WBC: 6.7 10*3/uL (ref 4.0–10.5)
nRBC: 0 % (ref 0.0–0.2)

## 2022-05-14 LAB — MAGNESIUM: Magnesium: 2.1 mg/dL (ref 1.7–2.4)

## 2022-05-14 LAB — BASIC METABOLIC PANEL
Anion gap: 6 (ref 5–15)
BUN: 25 mg/dL — ABNORMAL HIGH (ref 6–20)
CO2: 26 mmol/L (ref 22–32)
Calcium: 7.9 mg/dL — ABNORMAL LOW (ref 8.9–10.3)
Chloride: 108 mmol/L (ref 98–111)
Creatinine, Ser: 0.95 mg/dL (ref 0.61–1.24)
GFR, Estimated: >60 mL/min (ref >60)
Glucose, Bld: 90 mg/dL (ref 70–99)
Potassium: 4.3 mmol/L (ref 3.5–5.1)
Sodium: 140 mmol/L (ref 135–145)

## 2022-05-14 LAB — GLUCOSE, CAPILLARY
Glucose-Capillary: 83 mg/dL (ref 70–99)
Glucose-Capillary: 95 mg/dL (ref 70–99)
Glucose-Capillary: 113 mg/dL — ABNORMAL HIGH (ref 70–99)
Glucose-Capillary: 109 mg/dL — ABNORMAL HIGH (ref 70–99)

## 2022-05-14 NOTE — Progress Notes (Signed)
PROGRESS NOTE    [name] [name]  HUT:[id number] DOB: 03-Sep-1965 DOA: 05/09/2022 PCP: [name], MD    Chief Complaint  Patient presents with   Emesis   Urinary Tract Infection    Brief Narrative:  HPI per Dr. Claria Dice  This is a 55 year old male with past medical history of hypertension, CAD, depression and obstructive sleep apnea.  The patient presented to [facility] with complaint of fever, nausea, weakness vomiting and abdominal pain for the past 4 days.  The patient states he was just really really tired and fatigued.  He had flank pain for months, his PCP ordered the Flomax and he thought he passed the stone however, the pain did not improve.  He was not on antibiotics.  He presented to [facility].    UA done was positive for UTI, CT abdomen pelvis revealed 13 mm obstructing stone at right UPJ with associated mild hydronephrosis as well as several nonobstructing bilateral renal calculi. He also had left perinephric stranding secondary to likely recently passed stone.  He was sent to [facility] and taking immediately to the OR for stent placement.   Of note patient is on Plavix for stent placed in February this year.    Assessment & Plan:   Principal Problem:   Sepsis ([place]) Active Problems:   OSA (obstructive sleep apnea)   Pituitary adenoma (HCC)   Chronic respiratory failure with hypercapnia (HCC)   Morbid obesity (HCC)   Nephrolithiasis   Chronic diastolic heart failure (HCC)   Panhypopituitarism (HCC)   Physical deconditioning   Obesity hypoventilation syndrome (HCC)   Pyelonephritis   Kidney stone  #1 infected left urethral calculi/acute pyelonephritis -Patient noted to have presented with nausea, vomiting, abdominal pain, CVA tenderness, generalized fatigue.  Concern patient may have passed a stone. -Urinalysis done concerning for UTI.  CT abdomen and pelvis stone protocol revealed a 13 mm obstructing stone at the right UPJ with  associated mild hydronephrosis as well as several nonobstructing bilateral renal calculi.  Left perinephric stranding noted secondary to likely recently passed stone. -Patient transferred to [facility] long ED, seen by urology and taken immediately to the OR where patient underwent cystoscopy, left ureteral stent placement, left retrograde pyelography with interpretation. -Urine cultures pending.  Blood cultures pending with no growth to date..   -Patient noted to be on IV ciprofloxacin and has been transition to oral ciprofloxacin to complete a 14-day course of treatment.   -Foley catheter discontinued 05/12/2022, patient states good urine output.   -Outpatient follow-up with urology. -Per urology.   2.  CAD -Stable. -Per admitting physician urology cleared resumption of patient's Plavix. -Blood pressure noted to be soft/borderline and as such Imdur held as well as metoprolol on admission. -Continue metoprolol, aspirin, Plavix, Imdur, statin. -Supportive care.  3.  Hypothyroidism -Continue Synthroid.   4.  Depression -Zoloft and Wellbutrin  5.  Hyperlipidemia -Statin.  6.  Chronic respiratory failure with hypercapnia/chronic diastolic heart failure/obesity hypoventilation syndrome -Continue Provigil. -CPAP.  7.  Pituitary adenoma/physical deconditioning/panhypopituitarism -Noted to have been on hydrocortisone prior to admission, initially placed on IV Solu-Medrol on admission and transition back to home regimen hydrocortisone.   8.  Hypokalemia -Repleted, potassium of 4.3.   9.  Morbid obesity -Lifestyle modification.   DVT prophylaxis: SCDs Code Status: Full Family Communication: Updated patient, no family at bedside. Disposition: Likely home tomorrow.    Status is: Inpatient Remains inpatient appropriate because: Severity of illness   Consultants:  Urology: Dr. Louis Meckel 05/09/2022  Procedures:  CT renal stone protocol 05/09/2022, 05/08/2022 Cystoscopy/left ureteral stent  placement/left retrograde pyelography with interpretation per urology: Dr. Louis Meckel 05/09/2022  Antimicrobials:  IV ciprofloxacin 05/09/2022>>>> 05/13/2022 Oral ciprofloxacin 05/13/2022>>> 05/23/2022   Subjective: Laying in bed.  No chest pain.  No shortness of breath.  Abdominal pain improved.  Flank pain improved.  Tolerating diet.  Voiding post Foley catheter removal.  States both his parents were diagnosed with COVID yesterday and at his house.  Objective: Vitals:   05/13/22 1256 05/13/22 2132 05/14/22 0503 05/14/22 1009  BP: 109/78 (!) 100/54 106/68 107/76  Pulse: 64 65 (!) 58 64  Resp: '16 18 18 18  '$ Temp: 97.7 F (36.5 C) 97.7 F (36.5 C) (!) 97.5 F (36.4 C)   TempSrc: Oral Oral Oral   SpO2: 97% 99% 97% 100%  Weight:      Height:        Intake/Output Summary (Last 24 hours) at 05/14/2022 1256 Last data filed at 05/14/2022 1015 Gross per 24 hour  Intake 2102.4 ml  Output 2725 ml  Net -622.6 ml    Filed Weights   05/09/22 1713  Weight: (!) 180 kg    Examination:  General exam: NAD Respiratory system: CTA B.  No wheezes, no crackles, no rhonchi.  Fair air movement.  Speaking in full sentences.  Cardiovascular system: Regular rate rhythm no murmurs rubs or gallops.  No JVD.  No lower extremity edema.  Gastrointestinal system: Abdomen is soft, obese, nontender, nondistended, positive bowel sounds.  No significant left CVA TTP.   Central nervous system: Alert and oriented. No focal neurological deficits. Extremities: Symmetric 5 x 5 power. Skin: No rashes, lesions or ulcers Psychiatry: Judgement and insight appear normal. Mood & affect appropriate.     Data Reviewed: I have personally reviewed following labs and imaging studies  CBC: Recent Labs  Lab 05/08/22 1839 05/08/22 1858 05/10/22 0735 05/11/22 0912 05/12/22 0522 05/13/22 0552 05/14/22 0455  WBC 9.6   < > 3.5* 7.1 5.9 5.4 6.7  NEUTROABS 8.3*  --  3.1 6.3  --   --   --   HGB 13.2   < > 12.5* 13.2  12.5* 11.5* 11.6*  HCT 37.2*   < > 37.4* 40.0 38.0* 35.2* 35.8*  MCV 85.5   < > 89.7 90.1 89.8 89.6 92.3  PLT 98*   < > 89* 128* 140* 125* 142*   < > = values in this interval not displayed.     Basic Metabolic Panel: Recent Labs  Lab 05/10/22 0735 05/11/22 0912 05/12/22 0522 05/13/22 0552 05/14/22 0455  NA 134* 136 131* 138 140  K 3.9 4.3 3.9 3.3* 4.3  CL 101 102 97* 104 108  CO2 '25 23 27 29 26  '$ GLUCOSE 157* 121* 120* 87 90  BUN 17 24* 28* 32* 25*  CREATININE 1.09 1.08 1.08 1.09 0.95  CALCIUM 8.1* 8.7* 8.4* 8.2* 7.9*  MG  --  2.0 2.2  --  2.1     GFR: Estimated Creatinine Clearance: 142.2 mL/min (by C-G formula based on SCr of 0.95 mg/dL).  Liver Function Tests: Recent Labs  Lab 05/08/22 1839 05/09/22 1723  AST 19 49*  ALT 14 28  ALKPHOS 70 81  BILITOT 1.3* 1.1  PROT 6.4* 7.2  ALBUMIN 2.8* 3.8     CBG: Recent Labs  Lab 05/13/22 1140 05/13/22 1649 05/13/22 2133 05/14/22 0726 05/14/22 1119  GLUCAP 87 103* 93 83 95      Recent Results (from the past 240  hour(s))  Culture, blood (Routine X 2) w Reflex to ID Panel     Status: None (Preliminary result)   Collection Time: 05/10/22  2:05 AM   Specimen: BLOOD  Result Value Ref Range Status   Specimen Description   Final    BLOOD BLOOD LEFT HAND Performed at [facility] [address] [place], [place] [address]    Special Requests   Final    BOTTLES DRAWN AEROBIC ONLY Blood Culture adequate volume Performed at [facility] [address] [place], [place] [address]    Culture   Final    NO GROWTH 4 DAYS Performed at [facility], [place] [address] [place], [place] [address]    Report Status PENDING  Incomplete  Culture, blood (Routine X 2) w Reflex to ID Panel     Status: None (Preliminary result)   Collection Time: 05/10/22  2:05 AM   Specimen: BLOOD  Result Value Ref Range Status   Specimen Description   Final    BLOOD BLOOD RIGHT HAND Performed  at [facility] [address] [place], [place] [address]    Special Requests   Final    BOTTLES DRAWN AEROBIC ONLY Blood Culture results may not be optimal due to an inadequate volume of blood received in culture bottles Performed at [facility] [address], [place], [place] [address]    Culture   Final    NO GROWTH 4 DAYS Performed at [facility], [place] [address], [place], [place] [address]    Report Status PENDING  Incomplete         Radiology Studies: No results found.      Scheduled Meds:  allopurinol  300 mg Oral Daily   aspirin EC  81 mg Oral Daily   buPROPion  300 mg Oral Daily   Chlorhexidine Gluconate Cloth  6 each Topical Daily   ciprofloxacin  500 mg Oral BID   clopidogrel  75 mg Oral Q breakfast   diclofenac  75 mg Oral BID   And   misoprostol  200 mcg Oral BID   fentaNYL (SUBLIMAZE) injection  50 mcg Intravenous Once   hydrocortisone  20 mg Oral Daily   insulin aspart  0-15 Units Subcutaneous TID WC   insulin aspart  0-5 Units Subcutaneous QHS   isosorbide mononitrate  30 mg Oral Daily   levothyroxine  125 mcg Oral Q0600   metoprolol tartrate  12.5 mg Oral BID   pantoprazole  40 mg Oral Daily   rosuvastatin  20 mg Oral Daily   senna-docusate  1 tablet Oral BID   sertraline  150 mg Oral Daily   tamsulosin  0.4 mg Oral Daily   Continuous Infusions:  lactated ringers 75 mL/hr at 05/14/22 0152     LOS: 4 days    Time spent: 40 minutes    Irine Seal, MD [facility]   To contact the attending provider between 7A-7P or the covering provider during after hours 7P-7A, please log into the web site [email or web address] and access using universal [place] password for that web site. If you do not have the password, please call the hospital operator.  05/14/2022, 12:56 PM

## 2022-05-15 ENCOUNTER — Other Ambulatory Visit (HOSPITAL_COMMUNITY): Payer: Self-pay

## 2022-05-15 DIAGNOSIS — N200 Calculus of kidney: Secondary | ICD-10-CM | POA: Diagnosis not present

## 2022-05-15 DIAGNOSIS — J9612 Chronic respiratory failure with hypercapnia: Secondary | ICD-10-CM | POA: Diagnosis not present

## 2022-05-15 DIAGNOSIS — G4733 Obstructive sleep apnea (adult) (pediatric): Secondary | ICD-10-CM | POA: Diagnosis not present

## 2022-05-15 DIAGNOSIS — N12 Tubulo-interstitial nephritis, not specified as acute or chronic: Secondary | ICD-10-CM | POA: Diagnosis not present

## 2022-05-15 LAB — BASIC METABOLIC PANEL
Anion gap: 5 (ref 5–15)
BUN: 24 mg/dL — ABNORMAL HIGH (ref 6–20)
CO2: 29 mmol/L (ref 22–32)
Calcium: 7.9 mg/dL — ABNORMAL LOW (ref 8.9–10.3)
Chloride: 105 mmol/L (ref 98–111)
Creatinine, Ser: 1.26 mg/dL — ABNORMAL HIGH (ref 0.61–1.24)
GFR, Estimated: >60 mL/min (ref >60)
Glucose, Bld: 98 mg/dL (ref 70–99)
Potassium: 4.2 mmol/L (ref 3.5–5.1)
Sodium: 139 mmol/L (ref 135–145)

## 2022-05-15 LAB — CULTURE, BLOOD (ROUTINE X 2)
Culture: NO GROWTH
Culture: NO GROWTH
Special Requests: ADEQUATE

## 2022-05-15 LAB — GLUCOSE, CAPILLARY
Glucose-Capillary: 86 mg/dL (ref 70–99)
Glucose-Capillary: 87 mg/dL (ref 70–99)
Glucose-Capillary: 105 mg/dL — ABNORMAL HIGH (ref 70–99)
Glucose-Capillary: 92 mg/dL (ref 70–99)

## 2022-05-15 MED ORDER — ACETAMINOPHEN 325 MG PO TABS: 650.0000 mg | ORAL_TABLET | Freq: Four times a day (QID) | ORAL | Status: DC | PRN

## 2022-05-15 MED ORDER — CIPROFLOXACIN HCL 500 MG PO TABS
500.0000 mg | ORAL_TABLET | Freq: Two times a day (BID) | ORAL | 0 refills | Status: AC
  Filled 2022-05-15: qty 18, 9d supply, fill #0

## 2022-05-15 MED ORDER — SACCHAROMYCES BOULARDII 250 MG PO CAPS
250.0000 mg | ORAL_CAPSULE | Freq: Two times a day (BID) | ORAL | Status: DC
  Administered 2022-05-15 – 2022-05-17 (×5): 250 mg via ORAL
  Filled 2022-05-15 (×5): qty 1

## 2022-05-15 MED ORDER — SODIUM CHLORIDE 0.9 % IV SOLN: INTRAVENOUS | Status: DC

## 2022-05-15 NOTE — Care Management Important Message (Signed)
Important Message  Patient Details IM Letter given. Name: Journey Ratterman MRN: [id number] Date of Birth: January 14, 1966   Medicare Important Message Given:  Yes     Kerin Salen 05/15/2022, 1:53 PM

## 2022-05-15 NOTE — Plan of Care (Signed)
  Problem: Skin Integrity: Goal: Risk for impaired skin integrity will decrease Outcome: Adequate for Discharge   Problem: Tissue Perfusion: Goal: Adequacy of tissue perfusion will improve Outcome: Adequate for Discharge   Problem: Education: Goal: Knowledge of General Education information will improve Description: Including pain rating scale, medication(s)/side effects and non-pharmacologic comfort measures Outcome: Adequate for Discharge   Problem: Clinical Measurements: Goal: Will remain free from infection Outcome: Adequate for Discharge Goal: Respiratory complications will improve Outcome: Adequate for Discharge   Problem: Nutrition: Goal: Adequate nutrition will be maintained Outcome: Adequate for Discharge   Problem: Coping: Goal: Level of anxiety will decrease Outcome: Adequate for Discharge   Problem: Coping: Goal: Ability to adjust to condition or change in health will improve Outcome: Completed/Met   Problem: Fluid Volume: Goal: Ability to maintain a balanced intake and output will improve Outcome: Completed/Met   Problem: Safety: Goal: Ability to remain free from injury will improve Outcome: Completed/Met

## 2022-05-15 NOTE — Progress Notes (Signed)
PROGRESS NOTE    [name] [name]  VQM:[id number] DOB: 1965/06/18 DOA: 05/09/2022 PCP: [name], MD    Chief Complaint  Patient presents with   Emesis   Urinary Tract Infection    Brief Narrative:  HPI per Dr. Claria Dice  This is a 56 year old male with past medical history of hypertension, CAD, depression and obstructive sleep apnea.  The patient presented to [place] with complaint of fever, nausea, weakness vomiting and abdominal pain for the past 4 days.  The patient states he was just really really tired and fatigued.  He had flank pain for months, his PCP ordered the Flomax and he thought he passed the stone however, the pain did not improve.  He was not on antibiotics.  He presented to [place].    UA done was positive for UTI, CT abdomen pelvis revealed 13 mm obstructing stone at right UPJ with associated mild hydronephrosis as well as several nonobstructing bilateral renal calculi. He also had left perinephric stranding secondary to likely recently passed stone.  He was sent to [facility] and taking immediately to the OR for stent placement.   Of note patient is on Plavix for stent placed in February this year.    Assessment & Plan:   Principal Problem:   Sepsis ([place]) Active Problems:   OSA (obstructive sleep apnea)   Pituitary adenoma (HCC)   Chronic respiratory failure with hypercapnia (HCC)   Morbid obesity (HCC)   Nephrolithiasis   Chronic diastolic heart failure (HCC)   Panhypopituitarism (HCC)   Physical deconditioning   Obesity hypoventilation syndrome (HCC)   Pyelonephritis   Kidney stone  #1 infected left urethral calculi/acute pyelonephritis -Patient noted to have presented with nausea, vomiting, abdominal pain, CVA tenderness, generalized fatigue.  Concern patient may have passed a stone. -Urinalysis done concerning for UTI.  CT abdomen and pelvis stone protocol revealed a 13 mm obstructing stone at the right UPJ with  associated mild hydronephrosis as well as several nonobstructing bilateral renal calculi.  Left perinephric stranding noted secondary to likely recently passed stone. -Patient transferred to [facility] long ED, seen by urology and taken immediately to the OR where patient underwent cystoscopy, left ureteral stent placement, left retrograde pyelography with interpretation. -Urine cultures pending.  Blood cultures pending with no growth to date..   -Patient noted to be on IV ciprofloxacin and has been transition to oral ciprofloxacin to complete a 14-day course of treatment.   -Foley catheter discontinued 05/12/2022, patient states good urine output of 3 L over the past 24 hours.   -Outpatient follow-up with urology. -Per urology.   2.  CAD -Stable. -Per admitting physician urology cleared resumption of patient's Plavix. -Blood pressure noted to be soft/borderline and as such Imdur held as well as metoprolol on admission. -Continue metoprolol, aspirin, Plavix, Imdur, statin. -Supportive care.  3.  Hypothyroidism -Continue Synthroid.   4.  Depression -Continue Zoloft and Wellbutrin  5.  Hyperlipidemia -Statin.  6.  Chronic respiratory failure with hypercapnia/chronic diastolic heart failure/obesity hypoventilation syndrome -Continue Provigil. -CPAP.  7.  Pituitary adenoma/physical deconditioning/panhypopituitarism -Noted to have been on hydrocortisone prior to admission, initially placed on IV Solu-Medrol on admission and transitioned back to home regimen hydrocortisone.   8.  Hypokalemia -Repleted, potassium of 4.2.   9.  Morbid obesity -Lifestyle modification.   DVT prophylaxis: SCDs Code Status: Full Family Communication: Updated patient, no family at bedside. Disposition: Likely home tomorrow.    Status is: Inpatient Remains inpatient appropriate because: Severity of illness  Consultants:  Urology: Dr. Louis Meckel 05/09/2022  Procedures:  CT renal stone protocol 05/09/2022,  05/08/2022 Cystoscopy/left ureteral stent placement/left retrograde pyelography with interpretation per urology: Dr. Louis Meckel 05/09/2022  Antimicrobials:  IV ciprofloxacin 05/09/2022>>>> 05/13/2022 Oral ciprofloxacin 05/13/2022>>> 05/23/2022   Subjective: Laying in bed.  States does not feel too well.  Complaining of bouts of diarrhea last night and this morning.  No chest pain.  No shortness of breath.  Complain of some lower abdominal pain.  No flank pain.   Objective: Vitals:   05/14/22 2108 05/15/22 0546 05/15/22 1032 05/15/22 1243  BP: (!) 104/59 103/74 127/84 112/72  Pulse: 63 61 67 64  Resp: '20 18  17  '$ Temp: 97.8 F (36.6 C) 97.6 F (36.4 C)  98.8 F (37.1 C)  TempSrc: Oral Oral  Oral  SpO2: 98% 98%  98%  Weight:      Height:        Intake/Output Summary (Last 24 hours) at 05/15/2022 1323 Last data filed at 05/15/2022 1130 Gross per 24 hour  Intake 720 ml  Output 2550 ml  Net -1830 ml    Filed Weights   05/09/22 1713  Weight: (!) 180 kg    Examination:  General exam: NAD Respiratory system: Lungs clear to auscultation bilaterally.  No wheezes, no crackles, no rhonchi.   Cardiovascular system: RRR no murmurs rubs or gallops.  No JVD.  No lower extremity edema.  Gastrointestinal system: Abdomen is soft, obese, some tenderness to palpation in the suprapubic region, positive bowel sounds.  No significant left CVA TTP.  Central nervous system: Alert and oriented. No focal neurological deficits. Extremities: Symmetric 5 x 5 power. Skin: No rashes, lesions or ulcers Psychiatry: Judgement and insight appear normal. Mood & affect appropriate.     Data Reviewed: I have personally reviewed following labs and imaging studies  CBC: Recent Labs  Lab 05/08/22 1839 05/08/22 1858 05/10/22 0735 05/11/22 0912 05/12/22 0522 05/13/22 0552 05/14/22 0455  WBC 9.6   < > 3.5* 7.1 5.9 5.4 6.7  NEUTROABS 8.3*  --  3.1 6.3  --   --   --   HGB 13.2   < > 12.5* 13.2 12.5* 11.5*  11.6*  HCT 37.2*   < > 37.4* 40.0 38.0* 35.2* 35.8*  MCV 85.5   < > 89.7 90.1 89.8 89.6 92.3  PLT 98*   < > 89* 128* 140* 125* 142*   < > = values in this interval not displayed.     Basic Metabolic Panel: Recent Labs  Lab 05/11/22 0912 05/12/22 0522 05/13/22 0552 05/14/22 0455 05/15/22 0429  NA 136 131* 138 140 139  K 4.3 3.9 3.3* 4.3 4.2  CL 102 97* 104 108 105  CO2 '23 27 29 26 29  '$ GLUCOSE 121* 120* 87 90 98  BUN 24* 28* 32* 25* 24*  CREATININE 1.08 1.08 1.09 0.95 1.26*  CALCIUM 8.7* 8.4* 8.2* 7.9* 7.9*  MG 2.0 2.2  --  2.1  --      GFR: Estimated Creatinine Clearance: 107.2 mL/min (A) (by C-G formula based on SCr of 1.26 mg/dL (H)).  Liver Function Tests: Recent Labs  Lab 05/08/22 1839 05/09/22 1723  AST 19 49*  ALT 14 28  ALKPHOS 70 81  BILITOT 1.3* 1.1  PROT 6.4* 7.2  ALBUMIN 2.8* 3.8     CBG: Recent Labs  Lab 05/14/22 1119 05/14/22 1638 05/14/22 2105 05/15/22 0752 05/15/22 1237  GLUCAP 95 113* 109* 86 87  Recent Results (from the past 240 hour(s))  Culture, blood (Routine X 2) w Reflex to ID Panel     Status: None   Collection Time: 05/10/22  2:05 AM   Specimen: BLOOD  Result Value Ref Range Status   Specimen Description   Final    BLOOD BLOOD LEFT HAND Performed at [facility] [address] [place], [place] [address]    Special Requests   Final    BOTTLES DRAWN AEROBIC ONLY Blood Culture adequate volume Performed at [facility] [address], [place], [place] [address]    Culture   Final    NO GROWTH 5 DAYS Performed at [facility], [place] [address], [place], [place] [address]    Report Status 05/15/2022 FINAL  Final  Culture, blood (Routine X 2) w Reflex to ID Panel     Status: None   Collection Time: 05/10/22  2:05 AM   Specimen: BLOOD  Result Value Ref Range Status   Specimen Description   Final    BLOOD BLOOD RIGHT HAND Performed at [facility]  [address], [place], [place] [address]    Special Requests   Final    BOTTLES DRAWN AEROBIC ONLY Blood Culture results may not be optimal due to an inadequate volume of blood received in culture bottles Performed at [facility] [address] [place], [place] [address]    Culture   Final    NO GROWTH 5 DAYS Performed at [facility], [place] [address] [place], [place] [address]    Report Status 05/15/2022 FINAL  Final         Radiology Studies: No results found.      Scheduled Meds:  allopurinol  300 mg Oral Daily   aspirin EC  81 mg Oral Daily   buPROPion  300 mg Oral Daily   Chlorhexidine Gluconate Cloth  6 each Topical Daily   ciprofloxacin  500 mg Oral BID   clopidogrel  75 mg Oral Q breakfast   diclofenac  75 mg Oral BID   And   misoprostol  200 mcg Oral BID   fentaNYL (SUBLIMAZE) injection  50 mcg Intravenous Once   hydrocortisone  20 mg Oral Daily   insulin aspart  0-15 Units Subcutaneous TID WC   insulin aspart  0-5 Units Subcutaneous QHS   isosorbide mononitrate  30 mg Oral Daily   levothyroxine  125 mcg Oral Q0600   metoprolol tartrate  12.5 mg Oral BID   pantoprazole  40 mg Oral Daily   rosuvastatin  20 mg Oral Daily   senna-docusate  1 tablet Oral BID   sertraline  150 mg Oral Daily   tamsulosin  0.4 mg Oral Daily   Continuous Infusions:  lactated ringers 75 mL/hr at 05/15/22 0529     LOS: 5 days    Time spent: 40 minutes    Irine Seal, MD [facility]   To contact the attending provider between 7A-7P or the covering provider during after hours 7P-7A, please log into the web site [email or web address] and access using universal [place] password for that web site. If you do not have the password, please call the hospital operator.  05/15/2022, 1:23 PM

## 2022-05-16 ENCOUNTER — Inpatient Hospital Stay (HOSPITAL_COMMUNITY): Payer: Medicare HMO

## 2022-05-16 ENCOUNTER — Other Ambulatory Visit (HOSPITAL_COMMUNITY): Payer: Self-pay

## 2022-05-16 DIAGNOSIS — R0989 Other specified symptoms and signs involving the circulatory and respiratory systems: Secondary | ICD-10-CM

## 2022-05-16 LAB — CBC WITH DIFFERENTIAL/PLATELET
Abs Immature Granulocytes: 0.22 10*3/uL — ABNORMAL HIGH (ref 0.00–0.07)
Basophils Absolute: 0.1 10*3/uL (ref 0.0–0.1)
Basophils Relative: 1 %
Eosinophils Absolute: 0.2 10*3/uL (ref 0.0–0.5)
Eosinophils Relative: 4 %
HCT: 36.9 % — ABNORMAL LOW (ref 39.0–52.0)
Hemoglobin: 11.8 g/dL — ABNORMAL LOW (ref 13.0–17.0)
Immature Granulocytes: 3 %
Lymphocytes Relative: 26 %
Lymphs Abs: 1.7 10*3/uL (ref 0.7–4.0)
MCH: 29.2 pg (ref 26.0–34.0)
MCHC: 32 g/dL (ref 30.0–36.0)
MCV: 91.3 fL (ref 80.0–100.0)
Monocytes Absolute: 0.4 10*3/uL (ref 0.1–1.0)
Monocytes Relative: 5 %
Neutro Abs: 4.1 10*3/uL (ref 1.7–7.7)
Neutrophils Relative %: 61 %
Platelets: 143 10*3/uL — ABNORMAL LOW (ref 150–400)
RBC: 4.04 MIL/uL — ABNORMAL LOW (ref 4.22–5.81)
RDW: 13.2 % (ref 11.5–15.5)
WBC: 6.6 10*3/uL (ref 4.0–10.5)
nRBC: 0 % (ref 0.0–0.2)

## 2022-05-16 LAB — BASIC METABOLIC PANEL
Anion gap: 4 — ABNORMAL LOW (ref 5–15)
BUN: 17 mg/dL (ref 6–20)
CO2: 29 mmol/L (ref 22–32)
Calcium: 8.3 mg/dL — ABNORMAL LOW (ref 8.9–10.3)
Chloride: 108 mmol/L (ref 98–111)
Creatinine, Ser: 1.04 mg/dL (ref 0.61–1.24)
GFR, Estimated: >60 mL/min (ref >60)
Glucose, Bld: 92 mg/dL (ref 70–99)
Potassium: 4.1 mmol/L (ref 3.5–5.1)
Sodium: 141 mmol/L (ref 135–145)

## 2022-05-16 LAB — GLUCOSE, CAPILLARY
Glucose-Capillary: 88 mg/dL (ref 70–99)
Glucose-Capillary: 100 mg/dL — ABNORMAL HIGH (ref 70–99)
Glucose-Capillary: 103 mg/dL — ABNORMAL HIGH (ref 70–99)
Glucose-Capillary: 125 mg/dL — ABNORMAL HIGH (ref 70–99)

## 2022-05-16 LAB — RESP PANEL BY RT-PCR (RSV, FLU A&B, COVID)  RVPGX2
Influenza A by PCR: NEGATIVE
Influenza B by PCR: NEGATIVE
Resp Syncytial Virus by PCR: NEGATIVE
SARS Coronavirus 2 by RT PCR: NEGATIVE

## 2022-05-16 LAB — MAGNESIUM: Magnesium: 2.3 mg/dL (ref 1.7–2.4)

## 2022-05-16 MED ORDER — LORATADINE 10 MG PO TABS
10.0000 mg | ORAL_TABLET | Freq: Every day | ORAL | Status: DC
  Administered 2022-05-16: 10 mg via ORAL
  Filled 2022-05-16 (×2): qty 1

## 2022-05-16 MED ORDER — SACCHAROMYCES BOULARDII 250 MG PO CAPS: 250.0000 mg | ORAL_CAPSULE | Freq: Two times a day (BID) | ORAL | Status: DC

## 2022-05-16 MED ORDER — IPRATROPIUM-ALBUTEROL 20-100 MCG/ACT IN AERS
1.0000 | INHALATION_SPRAY | Freq: Four times a day (QID) | RESPIRATORY_TRACT | Status: DC
  Filled 2022-05-16: qty 4

## 2022-05-16 MED ORDER — STIOLTO RESPIMAT 2.5-2.5 MCG/ACT IN AERS
INHALATION_SPRAY | RESPIRATORY_TRACT | 0 refills | Status: AC
  Filled 2022-05-16: qty 4, 30d supply, fill #0

## 2022-05-16 MED ORDER — FLUTICASONE PROPIONATE 50 MCG/ACT NA SUSP
2.0000 | Freq: Every day | NASAL | Status: DC
  Administered 2022-05-16 – 2022-05-17 (×2): 2 via NASAL
  Filled 2022-05-16: qty 16

## 2022-05-16 MED ORDER — IPRATROPIUM-ALBUTEROL 0.5-2.5 (3) MG/3ML IN SOLN: 3.0000 mL | Freq: Four times a day (QID) | RESPIRATORY_TRACT | Status: DC | PRN

## 2022-05-16 MED ORDER — GUAIFENESIN ER 600 MG PO TB12
1200.0000 mg | ORAL_TABLET | Freq: Two times a day (BID) | ORAL | Status: DC
  Administered 2022-05-16 – 2022-05-17 (×3): 1200 mg via ORAL
  Filled 2022-05-16 (×3): qty 2

## 2022-05-16 MED ORDER — GUAIFENESIN ER 600 MG PO TB12
1200.0000 mg | ORAL_TABLET | Freq: Two times a day (BID) | ORAL | 0 refills | Status: AC
  Filled 2022-05-16: qty 28, 7d supply, fill #0

## 2022-05-16 NOTE — Progress Notes (Signed)
PROGRESS NOTE    [name] [name]  FIE:[id number] DOB: 09-17-1965 DOA: 05/09/2022 PCP: [name], MD    Chief Complaint  Patient presents with   Emesis   Urinary Tract Infection    Brief Narrative:  HPI per Dr. Claria Dice  This is a 56 year old male with past medical history of hypertension, CAD, depression and obstructive sleep apnea.  The patient presented to [facility] with complaint of fever, nausea, weakness vomiting and abdominal pain for the past 4 days.  The patient states he was just really really tired and fatigued.  He had flank pain for months, his PCP ordered the Flomax and he thought he passed the stone however, the pain did not improve.  He was not on antibiotics.  He presented to [place].    UA done was positive for UTI, CT abdomen pelvis revealed 13 mm obstructing stone at right UPJ with associated mild hydronephrosis as well as several nonobstructing bilateral renal calculi. He also had left perinephric stranding secondary to likely recently passed stone.  He was sent to [facility] and taking immediately to the OR for stent placement.   Of note patient is on Plavix for stent placed in February this year.    Assessment & Plan:   Principal Problem:   Sepsis ([place]) Active Problems:   OSA (obstructive sleep apnea)   Pituitary adenoma (HCC)   Chronic respiratory failure with hypercapnia (HCC)   Morbid obesity (HCC)   Nephrolithiasis   Chronic diastolic heart failure (HCC)   Panhypopituitarism (HCC)   Physical deconditioning   Obesity hypoventilation syndrome (HCC)   Pyelonephritis   Kidney stone   Symptoms of upper respiratory infection (URI)  #1 infected left urethral calculi/acute pyelonephritis -Patient noted to have presented with nausea, vomiting, abdominal pain, CVA tenderness, generalized fatigue.  Concern patient may have passed a stone. -Urinalysis done concerning for UTI.  CT abdomen and pelvis stone protocol revealed a 13 mm  obstructing stone at the right UPJ with associated mild hydronephrosis as well as several nonobstructing bilateral renal calculi.   -Left perinephric stranding noted secondary to likely recently passed stone. -Patient transferred to [facility] long ED, seen by urology and taken immediately to the OR where patient underwent cystoscopy, left ureteral stent placement, left retrograde pyelography with interpretation. -Urine cultures pending.   -Blood cultures pending with no growth to date..   -Patient noted to be on IV ciprofloxacin and has been transition to oral ciprofloxacin to complete a 14-day course of treatment.   -Foley catheter discontinued 05/12/2022, patient states good urine output of 2.280 L over the past 24 hours.  -Patient today with complaints of right flank pain. -Repeat CT abdomen and pelvis renal stone protocol. -Outpatient follow-up with urology. -Per urology.   2.  CAD -Stable. -Per admitting physician urology cleared resumption of patient's Plavix. -Blood pressure noted to be soft/borderline and as such Imdur held as well as metoprolol on admission. -Cardiac medications resumed on metoprolol, Imdur, aspirin, Plavix, statin.   -Supportive care.  3.  Upper respiratory symptoms -Patient with complaints of upper respiratory symptoms today with complaints of congestion, sore throat, cough, not feeling too well. -It is noted that patient's parents recently diagnosed with COVID-19 and staying at his house. -Check a respiratory viral panel, check a COVID-19 PCR, influenza panel. -Mucinex 1200 mg twice daily, Claritin, Flonase. -Combivent every 6 hours. -Supportive care.  4.  Hypothyroidism -Synthroid  5.  Depression -Continue home regimen Zoloft, Wellbutrin.  6.  Hyperlipidemia -Continue statin.  7.  Chronic respiratory failure with hypercapnia/chronic diastolic heart failure/obesity hypoventilation syndrome -Continue Provigil. -CPAP.  8.  Pituitary adenoma/physical  deconditioning/panhypopituitarism -Noted to have been on hydrocortisone prior to admission, initially placed on IV Solu-Medrol on admission and transitioned back to home regimen hydrocortisone.   9.  Hypokalemia -Repleted, potassium of 4.1.   10.  Morbid obesity -Lifestyle modification.   DVT prophylaxis: SCDs Code Status: Full Family Communication: Updated patient, no family at bedside. Disposition: Likely home tomorrow.    Status is: Inpatient Remains inpatient appropriate because: Severity of illness   Consultants:  Urology: Dr. Louis Meckel 05/09/2022  Procedures:  CT renal stone protocol 05/09/2022, 05/08/2022 Cystoscopy/left ureteral stent placement/left retrograde pyelography with interpretation per urology: Dr. Louis Meckel 05/09/2022  Antimicrobials:  IV ciprofloxacin 05/09/2022>>>> 05/13/2022 Oral ciprofloxacin 05/13/2022>>> 05/23/2022   Subjective: Patient laying in bed.  States does not feel well today.  Complaining now of right flank tenderness.  Complain of upper respiratory symptoms of congestion, sore throat per RN.  States to RN that he feels as bad as when he was first admitted.  Objective: Vitals:   05/15/22 2007 05/16/22 0508 05/16/22 0933 05/16/22 1224  BP: (!) 144/102 112/67 115/64 114/69  Pulse: 70 62 60 68  Resp: '17 20  19  '$ Temp: 98 F (36.7 C) 97.9 F (36.6 C)  98 F (36.7 C)  TempSrc: Oral Oral    SpO2: 98% 96%  97%  Weight:      Height:        Intake/Output Summary (Last 24 hours) at 05/16/2022 1516 Last data filed at 05/16/2022 1500 Gross per 24 hour  Intake 1625 ml  Output 2930 ml  Net -1305 ml   Filed Weights   05/09/22 1713  Weight: (!) 180 kg    Examination:  General exam: NAD Respiratory system: Lungs clear to auscultation bilaterally.  No wheezes, no crackles, no rhonchi.  Fair air movement.  Speaking in full sentences.  Cardiovascular system: Regular rate rhythm no murmurs rubs or gallops.  No JVD.  No lower extremity  edema. Gastrointestinal system: Abdomen is obese, soft, some tenderness to palpation in the suprapubic region.  Right CVA TTP.  No significant left CVA TTP.  Central nervous system: Alert and oriented. No focal neurological deficits. Extremities: Symmetric 5 x 5 power. Skin: No rashes, lesions or ulcers Psychiatry: Judgement and insight appear normal. Mood & affect appropriate.     Data Reviewed: I have personally reviewed following labs and imaging studies  CBC: Recent Labs  Lab 05/10/22 0735 05/11/22 0912 05/12/22 0522 05/13/22 0552 05/14/22 0455 05/16/22 0912  WBC 3.5* 7.1 5.9 5.4 6.7 6.6  NEUTROABS 3.1 6.3  --   --   --  4.1  HGB 12.5* 13.2 12.5* 11.5* 11.6* 11.8*  HCT 37.4* 40.0 38.0* 35.2* 35.8* 36.9*  MCV 89.7 90.1 89.8 89.6 92.3 91.3  PLT 89* 128* 140* 125* 142* 143*    Basic Metabolic Panel: Recent Labs  Lab 05/11/22 0912 05/12/22 0522 05/13/22 0552 05/14/22 0455 05/15/22 0429 05/16/22 0912  NA 136 131* 138 140 139 141  K 4.3 3.9 3.3* 4.3 4.2 4.1  CL 102 97* 104 108 105 108  CO2 '23 27 29 26 29 29  '$ GLUCOSE 121* 120* 87 90 98 92  BUN 24* 28* 32* 25* 24* 17  CREATININE 1.08 1.08 1.09 0.95 1.26* 1.04  CALCIUM 8.7* 8.4* 8.2* 7.9* 7.9* 8.3*  MG 2.0 2.2  --  2.1  --  2.3    GFR: Estimated Creatinine  Clearance: 129.9 mL/min (by C-G formula based on SCr of 1.04 mg/dL).  Liver Function Tests: Recent Labs  Lab 05/09/22 1723  AST 49*  ALT 28  ALKPHOS 81  BILITOT 1.1  PROT 7.2  ALBUMIN 3.8    CBG: Recent Labs  Lab 05/15/22 1237 05/15/22 1649 05/15/22 2140 05/16/22 0802 05/16/22 1150  GLUCAP 87 105* 92 88 100*     Recent Results (from the past 240 hour(s))  Culture, blood (Routine X 2) w Reflex to ID Panel     Status: None   Collection Time: 05/10/22  2:05 AM   Specimen: BLOOD  Result Value Ref Range Status   Specimen Description   Final    BLOOD BLOOD LEFT HAND Performed at [facility], [place] [address], [place],  [place] [address]    Special Requests   Final    BOTTLES DRAWN AEROBIC ONLY Blood Culture adequate volume Performed at [facility] [address], [place], [place] [address]    Culture   Final    NO GROWTH 5 DAYS Performed at [facility], [place] [address], [place], [place] [address]    Report Status 05/15/2022 FINAL  Final  Culture, blood (Routine X 2) w Reflex to ID Panel     Status: None   Collection Time: 05/10/22  2:05 AM   Specimen: BLOOD  Result Value Ref Range Status   Specimen Description   Final    BLOOD BLOOD RIGHT HAND Performed at [facility] [address] [place], [place] [address]    Special Requests   Final    BOTTLES DRAWN AEROBIC ONLY Blood Culture results may not be optimal due to an inadequate volume of blood received in culture bottles Performed at [facility] [address] [place], [place] [address]    Culture   Final    NO GROWTH 5 DAYS Performed at [facility], [place] [address], [place], [place] [address]    Report Status 05/15/2022 FINAL  Final         Radiology Studies: No results found.      Scheduled Meds:  allopurinol  300 mg Oral Daily   aspirin EC  81 mg Oral Daily   buPROPion  300 mg Oral Daily   ciprofloxacin  500 mg Oral BID   clopidogrel  75 mg Oral Q breakfast   diclofenac  75 mg Oral BID   And   misoprostol  200 mcg Oral BID   fentaNYL (SUBLIMAZE) injection  50 mcg Intravenous Once   guaiFENesin  1,200 mg Oral BID   hydrocortisone  20 mg Oral Daily   insulin aspart  0-15 Units Subcutaneous TID WC   insulin aspart  0-5 Units Subcutaneous QHS   Ipratropium-Albuterol  1 puff Inhalation Q6H   isosorbide mononitrate  30 mg Oral Daily   levothyroxine  125 mcg Oral Q0600   metoprolol tartrate  12.5 mg Oral BID   pantoprazole  40 mg Oral Daily   rosuvastatin  20 mg Oral Daily   saccharomyces boulardii  250 mg Oral BID   senna-docusate  1 tablet Oral BID    sertraline  150 mg Oral Daily   tamsulosin  0.4 mg Oral Daily   Continuous Infusions:  lactated ringers 75 mL/hr at 05/15/22 0529     LOS: 6 days    Time spent: 40 minutes    Irine Seal, MD [facility]   To contact the attending  provider between 7A-7P or the covering provider during after hours 7P-7A, please log into the web site [email or web address] and access using universal [place] password for that web site. If you do not have the password, please call the hospital operator.  05/16/2022, 3:16 PM

## 2022-05-16 NOTE — TOC Transition Note (Signed)
Transition of Care [facility]) - CM/SW Discharge Note   Patient Details  Name: [name] [name] MRN: [id number] Date of Birth: 1965/10/05  Transition of Care [facility]) CM/SW Contact:  [name], RN Phone Number: 05/16/2022, 2:52 PM   Clinical Narrative:    Patient discharged to return home. No toc needs present.   Final next level of care: Home/Self Care Barriers to Discharge: Barriers Resolved   Patient Goals and CMS Choice Patient states their goals for this hospitalization and ongoing recovery are:: to go home CMS Medicare.gov Compare Post Acute Care list provided to:: Legal Guardian Choice offered to / list presented to : [place] / [place]  Discharge Placement                       Discharge Plan and Services   Discharge Planning Services: CM Consult                                 Social Determinants of Health (SDOH) Interventions     Readmission Risk Interventions   No data to display

## 2022-05-17 ENCOUNTER — Other Ambulatory Visit (HOSPITAL_COMMUNITY): Payer: Self-pay

## 2022-05-17 DIAGNOSIS — I2510 Atherosclerotic heart disease of native coronary artery without angina pectoris: Secondary | ICD-10-CM

## 2022-05-17 DIAGNOSIS — E893 Postprocedural hypopituitarism: Secondary | ICD-10-CM

## 2022-05-17 DIAGNOSIS — E782 Mixed hyperlipidemia: Secondary | ICD-10-CM

## 2022-05-17 DIAGNOSIS — N201 Calculus of ureter: Secondary | ICD-10-CM | POA: Diagnosis present

## 2022-05-17 DIAGNOSIS — E039 Hypothyroidism, unspecified: Secondary | ICD-10-CM

## 2022-05-17 DIAGNOSIS — N10 Acute tubulo-interstitial nephritis: Secondary | ICD-10-CM

## 2022-05-17 LAB — RESPIRATORY PANEL BY PCR

## 2022-05-17 LAB — CBC WITH DIFFERENTIAL/PLATELET
Abs Immature Granulocytes: 0.2 10*3/uL — ABNORMAL HIGH (ref 0.00–0.07)
Basophils Absolute: 0 10*3/uL (ref 0.0–0.1)
Basophils Relative: 1 %
Eosinophils Absolute: 0.2 10*3/uL (ref 0.0–0.5)
Eosinophils Relative: 3 %
HCT: 36.7 % — ABNORMAL LOW (ref 39.0–52.0)
Hemoglobin: 11.7 g/dL — ABNORMAL LOW (ref 13.0–17.0)
Immature Granulocytes: 3 %
Lymphocytes Relative: 23 %
Lymphs Abs: 1.6 10*3/uL (ref 0.7–4.0)
MCH: 29 pg (ref 26.0–34.0)
MCHC: 31.9 g/dL (ref 30.0–36.0)
MCV: 91.1 fL (ref 80.0–100.0)
Monocytes Absolute: 0.4 10*3/uL (ref 0.1–1.0)
Monocytes Relative: 6 %
Neutro Abs: 4.5 10*3/uL (ref 1.7–7.7)
Neutrophils Relative %: 64 %
Platelets: 143 10*3/uL — ABNORMAL LOW (ref 150–400)
RBC: 4.03 MIL/uL — ABNORMAL LOW (ref 4.22–5.81)
RDW: 13.4 % (ref 11.5–15.5)
WBC: 6.9 10*3/uL (ref 4.0–10.5)
nRBC: 0 % (ref 0.0–0.2)

## 2022-05-17 LAB — BASIC METABOLIC PANEL
Anion gap: 8 (ref 5–15)
BUN: 16 mg/dL (ref 6–20)
CO2: 27 mmol/L (ref 22–32)
Calcium: 8.2 mg/dL — ABNORMAL LOW (ref 8.9–10.3)
Chloride: 106 mmol/L (ref 98–111)
Creatinine, Ser: 1.15 mg/dL (ref 0.61–1.24)
GFR, Estimated: >60 mL/min (ref >60)
Glucose, Bld: 91 mg/dL (ref 70–99)
Potassium: 4 mmol/L (ref 3.5–5.1)
Sodium: 141 mmol/L (ref 135–145)

## 2022-05-17 LAB — GLUCOSE, CAPILLARY: Glucose-Capillary: 94 mg/dL (ref 70–99)

## 2022-05-17 MED ORDER — NYSTATIN 100000 UNIT/GM EX POWD
1.0000 | Freq: Two times a day (BID) | CUTANEOUS | 0 refills | Status: AC
  Filled 2022-05-17: qty 15, 8d supply, fill #0

## 2022-05-17 NOTE — Discharge Instructions (Signed)
Follow-up with your primary care provider in 1 to 2 weeks Follow-up with Dr. Louis Meckel with urology within 2 weeks, you should be contacted with a follow-up appointment. Take all prescribed occasions exactly as instructed including your course of oral antibiotics with ciprofloxacin and topical nystatin powder to the rash of your groin and lower abdomen. Advance activity as tolerated Consume a low-sodium diet Please return to the emergency department if you develop recurrent fevers, increasing pain or inability to tolerate oral intake.

## 2022-05-17 NOTE — Discharge Summary (Signed)
Physician Discharge Summary   Patient: [name] [name] MRN: [id number] DOB: Sep 01, 1965  Admit date:     05/09/2022  Discharge date: 05/17/22  Discharge Physician: Vernelle Emerald   PCP: [name], MD   Recommendations at discharge:    Patient instructed to follow-up with their primary care provider in 1 to 2 weeks Patient instructed to follow-up with Dr. Louis Meckel with urology in 2 weeks Patient instructed to complete their course of oral antibiotic therapy with ciprofloxacin For patient's candidal intertrigo patient was additionally prescribed a course of topical nystatin powder to the folds of the groin and lower abdomen Patient advised to consume a low-sodium diet    Discharge Diagnoses: Principal Problem:   Acute pyelonephritis Active Problems:   Sepsis ([place])   Right ureteral stone   Coronary artery disease involving native coronary artery of native heart without angina pectoris   Hypothyroidism   Mixed hyperlipidemia   Symptoms of upper respiratory infection (URI)   Chronic diastolic CHF (congestive heart failure) (HCC)   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 *   Hospital Course:  56 year old male with past medical history of hypothyroidism, hyperlipidemia, diastolic congestive heart failure, hypopituitarism, coronary artery disease, depression, obstructive sleep apnea initially presented to [facility] with complaints of fever nausea and weakness over 4 days as well as a several month history of right flank pain.  Upon evaluation in the emergency department CT imaging identified a 13 mm obstructing stone at the right UPJ associated with mild hydronephrosis as well as several nonobstructing bilateral renal calculi.   Patient was also found to have left perinephric stranding secondary to likely recently passed stone on that side or possible infectious process.  Urology was contacted and upon arrival to [facility] a ureteral stent was placed.  The hospitalist group was then contacted for admission to the hospital.  In the days that followed patient was managed with intravenous intravenous antibiotics initially, later transitioned to oral ciprofloxacin to complete a 14-day course of treatment.  Patient was additionally managed with a Foley catheter for several days which was later discontinued on 12/8.  In the days that followed, patient's symptoms gradually improved.  Patient began to tolerate oral intake and his strength also began to improve.  Arrangements were made for the patient to be discharged home in improved and stable condition with outpatient follow-up with urology for eventual stent removal.    Consultants: Dr. Louis Meckel with Urology Procedures performed: Left ureteral stent placement   Disposition: Home Diet recommendation:  Discharge Diet Orders (From admission, onward)     Start     Ordered   05/16/22 0000  Diet - low sodium heart healthy        05/16/22 1450           Cardiac diet  DISCHARGE MEDICATION: Allergies as of 05/17/2022       Reactions   Morphine Hives, Itching, Rash   Penicillins Anaphylaxis        Medication List     STOP taking these medications    modafinil 100 MG tablet Commonly known as: PROVIGIL       TAKE these medications    acetaminophen 325 MG tablet Commonly known as: TYLENOL Take 2 tablets (650 mg total) by mouth every 6 (six)  hours as needed for mild pain (or Fever >/= 101).   allopurinol 300 MG tablet Commonly known as: ZYLOPRIM Take 300 mg by mouth daily.   ALPRAZolam 0.25 MG tablet Commonly known as: XANAX Take 1 tablet (0.25 mg total) by mouth 3 (three) times daily as needed for anxiety or sleep (and QHS prn insomnia).    aspirin 81 MG chewable tablet Chew 81 mg by mouth daily.   buPROPion 300 MG 24 hr tablet Commonly known as: WELLBUTRIN XL Take 300 mg by mouth daily.   carboxymethylcellulose 0.5 % Soln Commonly known as: REFRESH PLUS Place 2 drops into both eyes daily as needed (for dry eyes).   ciprofloxacin 500 MG tablet Commonly known as: CIPRO Take 1 tablet (500 mg total) by mouth 2 (two) times daily for 9 days.   clopidogrel 75 MG tablet Commonly known as: PLAVIX Take 1 tablet (75 mg total) by mouth daily with breakfast.   cyclobenzaprine 10 MG tablet Commonly known as: FLEXERIL Take 10 mg by mouth as needed for muscle spasms.   diclofenac sodium 1 % Gel Commonly known as: VOLTAREN Apply 1 Application topically daily as needed (pain).   Diclofenac-miSOPROStol 75-0.2 MG Tbec Take 1 tablet by mouth 2 (two) times daily.   guaiFENesin 600 MG 12 hr tablet Commonly known as: MUCINEX Take 2 tablets (1,200 mg total) by mouth 2 (two) times daily for 7 days.   hydrocortisone 20 MG tablet Commonly known as: CORTEF Take 20 mg by mouth daily.   isosorbide mononitrate 30 MG 24 hr tablet Commonly known as: IMDUR Take 1 tablet (30 mg total) by mouth daily.   levothyroxine 125 MCG tablet Commonly known as: SYNTHROID Take 125 mcg by mouth daily before breakfast.   metoprolol tartrate 25 MG tablet Commonly known as: LOPRESSOR Take 0.5 tablets (12.5 mg total) by mouth 2 (two) times daily.   nystatin powder Commonly known as: MYCOSTATIN/NYSTOP Apply topically 2 (two) times daily for 14 days. Apply to folds of groin and abdomen.   Ozempic (1 MG/DOSE) 4 MG/3ML Sopn Generic drug: Semaglutide (1 MG/DOSE) Inject 1 mg into the skin every Sunday.   pantoprazole 40 MG tablet Commonly known as: PROTONIX Take 40 mg by mouth daily.   pseudoephedrine 120 MG 12 hr tablet Commonly known as: SUDAFED Take 120 mg by mouth as needed for congestion.   rOPINIRole 0.5 MG tablet Commonly known as:  REQUIP Take 0.5 mg by mouth at bedtime as needed (RLS).   rosuvastatin 20 MG tablet Commonly known as: CRESTOR TAKE 1 TABLET DAILY   saccharomyces boulardii 250 MG capsule Commonly known as: FLORASTOR Take 1 capsule (250 mg total) by mouth 2 (two) times daily for 14 days.   sertraline 100 MG tablet Commonly known as: ZOLOFT Take 150 mg by mouth daily.   Stiolto Respimat 2.5-2.5 MCG/ACT Aers Generic drug: Tiotropium Bromide-Olodaterol Inhale 2 puffs into the lungs 2 (two) times daily for 7 days, THEN 2 puffs every 6 (six) hours as needed for up to 7 days. Start taking on: May 16, 2022   tamsulosin 0.4 MG Caps capsule Commonly known as: FLOMAX Take 0.4 mg by mouth daily.   traMADol 50 MG tablet Commonly known as: ULTRAM Take 50-100 mg by mouth at bedtime as needed for moderate pain.   TUMS PO Take 2 tablets by mouth as needed (heartburn).   zolpidem 10 MG tablet Commonly known as: AMBIEN Take 1 tablet (10 mg total) by mouth at bedtime as needed for sleep. What changed:  when to take this        Follow-up Information     [name], MD. Schedule an appointment as soon as possible for a visit in 2 week(s).   Specialty: Internal Medicine Contact information: [address] [place] [address] [phone]         Ardis Hughs, MD. Schedule an appointment as soon as possible for a visit in 2 week(s).   Specialty: Urology Contact information: [place] [address] [phone]                 Discharge Exam: Danley Danker Weights   05/09/22 1713  Weight: (!) 180 kg    Constitutional: Awake alert and oriented x3, no associated distress.   Respiratory: clear to auscultation bilaterally, no wheezing, no crackles. Normal respiratory effort. No accessory muscle use.  Cardiovascular: Regular rate and rhythm, no murmurs / rubs / gallops. No extremity edema. 2+ pedal pulses. No carotid bruits.  Abdomen: Abdomen is soft  and nontender.  No evidence of intra-abdominal masses.  Positive bowel sounds noted in all quadrants.   Musculoskeletal: No joint deformity upper and lower extremities. Good ROM, no contractures. Normal muscle tone.     Condition at discharge: fair  The results of significant diagnostics from this hospitalization (including imaging, microbiology, ancillary and laboratory) are listed below for reference.   Imaging Studies: CT RENAL STONE STUDY  Result Date: 05/16/2022 CLINICAL DATA:  Abdominal/flank pain, stone suspected EXAM: CT ABDOMEN AND PELVIS WITHOUT CONTRAST TECHNIQUE: Multidetector CT imaging of the abdomen and pelvis was performed following the standard protocol without IV contrast. RADIATION DOSE REDUCTION: This exam was performed according to the departmental dose-optimization program which includes automated exposure control, adjustment of the mA and/or kV according to patient size and/or use of iterative reconstruction technique. COMPARISON:  CT abdomen pelvis 05/09/2022 FINDINGS: Lower chest: No acute abnormality.  Coronary artery atherosclerosis. Hepatobiliary: There is a degree of fatty infiltration of the liver. Probable noncalcified gallstone versus tumefactive sludge near the gallbladder neck. No evidence of cholecystitis. Pancreas: Unremarkable. No pancreatic ductal dilatation or surrounding inflammatory changes. Spleen: Unchanged splenomegaly. Adrenals/Urinary Tract: Adrenal glands are unremarkable. Unchanged position of a 1.1 cm stone at the right ureteropelvic junction. There is a new right-sided nephroureteral stent in place, in appropriate position. Resolved hydronephrosis. Unchanged additional nonobstructing stones in the left and right kidneys. Unchanged renal cysts. The bladder is mildly distended. Stomach/Bowel: Small hiatal hernia. There is no evidence of bowel obstruction.The appendix is normal. Vascular/Lymphatic: Aortoiliac atherosclerosis. No AAA. No lymphadenopathy.  Reproductive: Unremarkable. Other: Fat containing left inguinal hernia. No bowel containing hernia. No free air. No ascites. Musculoskeletal: There is marked skin thickening and subcutaneous soft tissue swelling of the lower right anterior abdominal wall, similar to prior exam. Multilevel degenerative changes of the spine, severe at L4-L5 with grade 1 anterolisthesis, unchanged from prior exam. Multiple chronic lower rib fractures, unchanged from prior. No acute osseous abnormality. IMPRESSION: Unchanged 1.1 cm stone at the right ureteropelvic junction. Resolved hydronephrosis after nephroureteral stent placement. Unchanged additional nonobstructive stones in both kidneys. Marked skin thickening and subcutaneous stranding along the lower right anterior abdominal wall, correlate with exam for signs of cellulitis. Additional chronic findings as described above. Electronically Signed   By: Maurine Simmering M.D.   On: 05/16/2022 16:26   DG C-Arm 1-60 Min-No Report  Result Date: 05/09/2022 Fluoroscopy was utilized by the requesting physician.  No radiographic interpretation.   CT Renal Stone Study  Result  Date: 05/09/2022 CLINICAL DATA:  Abdominal pain.  Concern for kidney stone. EXAM: CT ABDOMEN AND PELVIS WITHOUT CONTRAST TECHNIQUE: Multidetector CT imaging of the abdomen and pelvis was performed following the standard protocol without IV contrast. RADIATION DOSE REDUCTION: This exam was performed according to the departmental dose-optimization program which includes automated exposure control, adjustment of the mA and/or kV according to patient size and/or use of iterative reconstruction technique. COMPARISON:  CT abdomen pelvis dated 05/08/2022. FINDINGS: Evaluation of this exam is limited in the absence of intravenous contrast. Lower chest: The visualized lung bases are clear. Advanced coronary vascular calcification of the LAD. No intra-abdominal free air or free fluid. Hepatobiliary: Fatty liver. No biliary  dilatation. Probable noncalcified gallstone in the proximal gallbladder versus a tumefactive sludge. No pericholecystic fluid or evidence of acute cholecystitis by CT. Pancreas: Unremarkable. No pancreatic ductal dilatation or surrounding inflammatory changes. Spleen: Splenomegaly measuring 18 cm in craniocaudal length. Adrenals/Urinary Tract: The adrenal glands are unremarkable. Similar positioning of a 13 mm obstructing stone at the right UPJ. There is mild right hydronephrosis. Several additional nonobstructing bilateral renal calculi measure up to 11 mm in the inferior pole of the left kidney. There is no hydronephrosis on the left. Small right renal interpolar cyst. Additional subcentimeter right renal inferior pole hypodense lesion which is too small to characterize. No imaging follow-up. Mild left perinephric stranding, nonspecific and may be related to recently passed stone. Correlation with urinalysis recommended to exclude UTI. The visualized ureters and urinary bladder appear unremarkable. Stomach/Bowel: Postsurgical changes of the rectosigmoid with anastomotic suture. Several small scattered sigmoid diverticula without active inflammatory changes. There is a small hiatal hernia. There is no bowel obstruction or active inflammation. The appendix is normal. Vascular/Lymphatic: Mild aortoiliac atherosclerotic disease. The IVC is unremarkable. No portal venous gas. There is no adenopathy. Reproductive: The prostate gland is small. Other: Small fat containing left inguinal hernia. Partially visualized skin thickening and induration of the subcutaneous soft tissues of the right side of the pannus may represent cellulitis. No drainable fluid collection/abscess identified. Fatty atrophy of the distal right rectus muscle. Musculoskeletal: Degenerative changes of the spine. L4-L5 endplate irregularity and disc space narrowing, likely degenerative. An infectious process is less likely but not excluded clinical  correlation is recommended. No acute osseous pathology. Old right rib fractures. IMPRESSION: 1. Similar positioning of a 13 mm obstructing stone at the right UPJ with mild right hydronephrosis. Several additional nonobstructing bilateral renal calculi. 2. Mild left perinephric stranding, nonspecific and may be related to recently passed stone. Correlation with urinalysis recommended to exclude UTI. 3. Fatty liver. 4. Splenomegaly. 5. Colonic diverticulosis. No bowel obstruction. Normal appendix. 6.  Aortic Atherosclerosis (ICD10-I70.0). Electronically Signed   By: Anner Crete M.D.   On: 05/09/2022 19:33   DG Chest 2 View  Result Date: 05/08/2022 CLINICAL DATA:  Chest pain EXAM: CHEST - 2 VIEW COMPARISON:  Chest x-ray 12/11/2013 FINDINGS: The heart size and mediastinal contours are within normal limits. Both lungs are clear. There are healed bilateral rib fractures, unchanged. IMPRESSION: No active cardiopulmonary disease. Electronically Signed   By: Ronney Asters M.D.   On: 05/08/2022 19:59   CT RENAL STONE STUDY  Result Date: 05/08/2022 CLINICAL DATA:  Patient reports abdominal pain for 3 days after passing kidney stone. EXAM: CT ABDOMEN AND PELVIS WITHOUT CONTRAST TECHNIQUE: Multidetector CT imaging of the abdomen and pelvis was performed following the standard protocol without IV contrast. RADIATION DOSE REDUCTION: This exam was performed according to the departmental dose-optimization  program which includes automated exposure control, adjustment of the mA and/or kV according to patient size and/or use of iterative reconstruction technique. COMPARISON:  CT 03/24/2014 FINDINGS: Lower chest: Chronic elevation of right hemidiaphragm. 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 No acute airspace disease or pleural effusion. Hepatobiliary: Diffusely decreased hepatic density typical of steatosis. The liver is enlarged spanning 19.5 cm cranial caudal. No evidence of focal  hepatic abnormality. Mild gallbladder distension. Suspected noncalcified intraluminal gallstones. No pericholecystic inflammation. No biliary dilatation. Pancreas: Mild fatty atrophy.  No ductal dilatation or inflammation. Spleen: Enlarged spanning 17.8 cm cranial caudal. No focal abnormalities on this unenhanced exam. Adrenals/Urinary Tract: No adrenal nodules. There is a 13 mm stone at/just beyond the right ureteropelvic junction with mild hydronephrosis. Trace right perinephric edema. At least 4 nonobstructing intrarenal calculi in the right kidney. Ureter is decompressed. Simple cyst posteriorly in the mid right kidney. No imaging follow-up is needed. There is left perinephric edema but no hydronephrosis. The left ureter is decompressed. There are 2 nonobstructing intrarenal calculi on the left, larger in the lower pole measures 12 mm. Mild to moderate left perinephric edema. No evidence of suspicious renal lesion. The urinary bladder is only minimally distended, no bladder wall thickening or stone. Stomach/Bowel: Small hiatal hernia. Otherwise unremarkable stomach. No small bowel obstruction or inflammation. Normal appendix visualized. Fluid/liquid stool in the proximal colon. Small volume of formed stool in the distal colon. Mild sigmoid diverticulosis without diverticulitis. Sigmoid enteric sutures. Vascular/Lymphatic: Mild aortic atherosclerosis. No aneurysm. There are few prominent right external iliac and inguinal nodes are not enlarged by size criteria. No suspicious adenopathy. Reproductive: Diminutive prostate. Other: There is diffuse skin thickening and subcutaneous edema of the right lower abdominal pannus. This is incompletely included in the field of view due to habitus. No soft tissue gas. No definite subcutaneous fluid collection. Postsurgical change of the anterior abdominal wall. No intra-abdominal ascites or free air Musculoskeletal: L4-L5 disc space loss with endplate irregularity and  sclerosis. Bilateral hip osteoarthritis. IMPRESSION: 1. Obstructing 13 mm stone at/just beyond the right ureteropelvic junction with mild hydronephrosis. 2. Bilateral nonobstructing intrarenal calculi. 3. Left perinephric edema which may indicate recently passed stone. No left hydronephrosis. 4. Hepatosplenomegaly and hepatic steatosis. 5. Skin thickening and subcutaneous edema of the right lower abdominal pannus, incompletely included in the field of view due to habitus. Recommend correlation for cellulitis. 6. Suspected noncalcified gallstones. 7. Mild sigmoid diverticulosis without diverticulitis. 8. L4-L5 disc space loss with endplate irregularity and sclerosis. This may be degenerative, however if there is clinical concern for discitis/osteomyelitis, recommend further evaluation with lumbar spine MRI. Aortic Atherosclerosis (ICD10-I70.0). Electronically Signed   By: Keith Rake M.D.   On: 05/08/2022 19:11    Microbiology: Results for orders placed or performed during the hospital encounter of 05/09/22  Culture, blood (Routine X 2) w Reflex to ID Panel     Status: None   Collection Time: 05/10/22  2:05 AM   Specimen: BLOOD  Result Value Ref Range Status   Specimen Description   Final    BLOOD BLOOD LEFT HAND Performed at [facility] [address], [place], [place] [address]    Special Requests   Final    BOTTLES DRAWN AEROBIC ONLY Blood Culture adequate volume Performed at [facility] [address], [place], [place] [address]    Culture   Final    NO GROWTH 5 DAYS Performed at [facility], [place]  [address] [place], [place] [address]    Report Status 05/15/2022 FINAL  Final  Culture, blood (Routine X 2) w Reflex to ID Panel     Status: None   Collection Time: 05/10/22  2:05 AM   Specimen: BLOOD  Result Value Ref Range Status   Specimen Description   Final    BLOOD BLOOD RIGHT HAND Performed at [facility]  [address], [place], [place] [address]    Special Requests   Final    BOTTLES DRAWN AEROBIC ONLY Blood Culture results may not be optimal due to an inadequate volume of blood received in culture bottles Performed at [facility] [address], [place], [place] [address]    Culture   Final    NO GROWTH 5 DAYS Performed at [facility], [place] [address] [place], [place] [address]    Report Status 05/15/2022 FINAL  Final  Respiratory (~20 pathogens) panel by PCR     Status: None   Collection Time: 05/16/22  3:08 PM   Specimen: Nasopharyngeal Swab; Respiratory  Result Value Ref Range Status   Adenovirus NOT DETECTED NOT DETECTED Final   Coronavirus 229E NOT DETECTED NOT DETECTED Final    Comment: (NOTE) The Coronavirus on the Respiratory Panel, DOES NOT test for the novel  Coronavirus (2019 nCoV)    Coronavirus HKU1 NOT DETECTED NOT DETECTED Final   Coronavirus NL63 NOT DETECTED NOT DETECTED Final   Coronavirus OC43 NOT DETECTED NOT DETECTED Final   Metapneumovirus NOT DETECTED NOT DETECTED Final   Rhinovirus / Enterovirus NOT DETECTED NOT DETECTED Final   Influenza A NOT DETECTED NOT DETECTED Final   Influenza B NOT DETECTED NOT DETECTED Final   Parainfluenza Virus 1 NOT DETECTED NOT DETECTED Final   Parainfluenza Virus 2 NOT DETECTED NOT DETECTED Final   Parainfluenza Virus 3 NOT DETECTED NOT DETECTED Final   Parainfluenza Virus 4 NOT DETECTED NOT DETECTED Final   Respiratory Syncytial Virus NOT DETECTED NOT DETECTED Final   Bordetella pertussis NOT DETECTED NOT DETECTED Final   Bordetella Parapertussis NOT DETECTED NOT DETECTED Final   Chlamydophila pneumoniae NOT DETECTED NOT DETECTED Final   Mycoplasma pneumoniae NOT DETECTED NOT DETECTED Final    Comment: Performed at [facility], [place] [address], [place], [place] [address]  Resp panel by RT-PCR (RSV, Flu A&B, Covid) Anterior Nasal Swab     Status: None   Collection Time: 05/16/22  3:08 PM    Specimen: Anterior Nasal Swab  Result Value Ref Range Status   SARS Coronavirus 2 by RT PCR NEGATIVE NEGATIVE Final    Comment: (NOTE) SARS-CoV-2 target nucleic acids are NOT DETECTED.  The SARS-CoV-2 RNA is generally detectable in upper respiratory specimens during the acute phase of infection. The lowest concentration of SARS-CoV-2 viral copies this assay can detect is 138 copies/mL. A negative result does not preclude SARS-Cov-2 infection and should not be used as the sole basis for treatment or other patient management decisions. A negative result may occur with  improper specimen collection/handling, submission of specimen other than nasopharyngeal swab, presence of viral mutation(s) within the areas targeted by this assay, and inadequate number of viral copies(<138 copies/mL). A negative result must be combined with clinical observations, patient history, and epidemiological information. The expected result is Negative.  Fact Sheet for Patients:  EntrepreneurPulse.com.au  Fact Sheet for Healthcare Providers:  IncredibleEmployment.be  This test is no t yet approved or cleared by the [place] and  has been authorized for detection and/or diagnosis of SARS-CoV-2 by FDA under an Emergency Use Authorization (EUA). This EUA will remain  in effect (meaning this test can be used) for the duration of the COVID-19 declaration under Section 564(b)(1) of the Act, 21 U.S.C.section 360bbb-3(b)(1), unless the authorization is terminated  or revoked sooner.       Influenza A by PCR NEGATIVE NEGATIVE Final   Influenza B by PCR NEGATIVE NEGATIVE Final    Comment: (NOTE) The Xpert Xpress SARS-CoV-2/FLU/RSV plus assay is intended as an aid in the diagnosis of influenza from Nasopharyngeal swab specimens and should not be used as a sole basis for treatment. Nasal washings and aspirates are unacceptable for Xpert Xpress  SARS-CoV-2/FLU/RSV testing.  Fact Sheet for Patients: EntrepreneurPulse.com.au  Fact Sheet for Healthcare Providers: IncredibleEmployment.be  This test is not yet approved or cleared by the [place] FDA and has been authorized for detection and/or diagnosis of SARS-CoV-2 by FDA under an Emergency Use Authorization (EUA). This EUA will remain in effect (meaning this test can be used) for the duration of the COVID-19 declaration under Section 564(b)(1) of the Act, 21 U.S.C. section 360bbb-3(b)(1), unless the authorization is terminated or revoked.     Resp Syncytial Virus by PCR NEGATIVE NEGATIVE Final    Comment: (NOTE) Fact Sheet for Patients: EntrepreneurPulse.com.au  Fact Sheet for Healthcare Providers: IncredibleEmployment.be  This test is not yet approved or cleared by the [place] FDA and has been authorized for detection and/or diagnosis of SARS-CoV-2 by FDA under an Emergency Use Authorization (EUA). This EUA will remain in effect (meaning this test can be used) for the duration of the COVID-19 declaration under Section 564(b)(1) of the Act, 21 U.S.C. section 360bbb-3(b)(1), unless the authorization is terminated or revoked.  Performed at [facility], [place] [address] [place], [place] [address]     Labs: CBC: Recent Labs  Lab 05/11/22 0912 05/12/22 0522 05/13/22 0552 05/14/22 0455 05/16/22 0912 05/17/22 0429  WBC 7.1 5.9 5.4 6.7 6.6 6.9  NEUTROABS 6.3  --   --   --  4.1 4.5  HGB 13.2 12.5* 11.5* 11.6* 11.8* 11.7*  HCT 40.0 38.0* 35.2* 35.8* 36.9* 36.7*  MCV 90.1 89.8 89.6 92.3 91.3 91.1  PLT 128* 140* 125* 142* 143* 737*   Basic Metabolic Panel: Recent Labs  Lab 05/11/22 0912 05/12/22 0522 05/13/22 0552 05/14/22 0455 05/15/22 0429 05/16/22 0912 05/17/22 0429  NA 136 131* 138 140 139 141 141  K 4.3 3.9 3.3* 4.3 4.2 4.1 4.0  CL 102 97* 104 108 105  108 106  CO2 '23 27 29 26 29 29 27  '$ GLUCOSE 121* 120* 87 90 98 92 91  BUN 24* 28* 32* 25* 24* 17 16  CREATININE 1.08 1.08 1.09 0.95 1.26* 1.04 1.15  CALCIUM 8.7* 8.4* 8.2* 7.9* 7.9* 8.3* 8.2*  MG 2.0 2.2  --  2.1  --  2.3  --    Liver Function Tests: No results for input(s): "AST", "ALT", "ALKPHOS", "BILITOT", "PROT", "ALBUMIN" in the last 168 hours. CBG: Recent Labs  Lab 05/16/22 0802 05/16/22 1150 05/16/22 1640 05/16/22 2038 05/17/22 0747  GLUCAP 88 100* 103* 125* 94    Discharge time spent: greater than 30 minutes.  Signed: Vernelle Emerald, MD [facility] 05/17/2022

## 2022-05-17 NOTE — TOC Transition Note (Signed)
Transition of Care [facility]) - CM/SW Discharge Note   Patient Details  Name: Naziah Weckerly MRN: [id number] Date of Birth: Feb 25, 1966  Transition of Care [facility]) CM/SW Contact:  [name], RN Phone Number: 05/17/2022, 8:56 AM   Clinical Narrative:     Patient dcd to return to home with self care.  Final next level of care: Home/Self Care Barriers to Discharge: Barriers Resolved   Patient Goals and CMS Choice Patient states their goals for this hospitalization and ongoing recovery are:: to go home CMS Medicare.gov Compare Post Acute Care list provided to:: Legal Guardian Choice offered to / list presented to : [place] / [place]  Discharge Placement                       Discharge Plan and Services   Discharge Planning Services: CM Consult                                 Social Determinants of Health (SDOH) Interventions     Readmission Risk Interventions   No data to display

## 2022-05-23 ENCOUNTER — Telehealth: Payer: Self-pay | Admitting: Cardiovascular Disease

## 2022-05-23 ENCOUNTER — Other Ambulatory Visit: Payer: Self-pay | Admitting: Urology

## 2022-05-23 NOTE — Telephone Encounter (Signed)
   Pre-operative Risk Assessment    Patient Name: [name] [name]  DOB: 1966/05/30 MRN: [id number]      Request for Surgical Clearance    Procedure:   Left Ureteroscopy Laser Lithotripsy Left Ureteral Stent Exchange  Date of Surgery:  Clearance 06/08/22                                 Surgeon:  Dr. Emogene Morgan Group or Practice Name:  [facility] Phone number:  [phone] Fax number:  [phone]   Type of Clearance Requested:   - Pharmacy:  Hold Clopidogrel (Plavix)     Type of Anesthesia:  General    Additional requests/questions:    Signed, Belisicia Lavell Anchors   05/23/2022, 11:29 AM

## 2022-05-23 NOTE — Telephone Encounter (Addendum)
   Name: [name] [name]  DOB: Oct 26, 1965  MRN: [id number]  Primary Cardiologist: Sanda Klein, MD  Chart reviewed as part of pre-operative protocol coverage. Because of [name] [name]'s past medical history and time since last visit, he will require a follow-up in-office visit in order to better assess preoperative cardiovascular risk.  Pre-op covering staff: - Please schedule appointment and call patient to inform them. If patient already had an upcoming appointment within acceptable timeframe, please add "pre-op clearance" to the appointment notes so provider is aware. - Please contact requesting surgeon's office via preferred method (i.e, phone, fax) to inform them of need for appointment prior to surgery.  This message will also be routed to  Dr. Sallyanne Kuster for input on holding Plavix as requested below so that this information is available to the clearing provider at time of patient's appointment.   Per Dr. Sallyanne Kuster, primary cardiologist "OK to hold the clopidogrel for 5-7 days before the procedure. Unless there are bleeding issues, would then restart the clopidogrel through 07/28/2022 ."   Lenna Sciara, NP  05/23/2022, 12:29 PM

## 2022-05-23 NOTE — Telephone Encounter (Signed)
Per the pre op provider the pt needs in office appt. Pt has been scheduled to see Dr. Sallyanne Kuster 05/25/22 @ 9 am.

## 2022-05-25 ENCOUNTER — Encounter: Payer: Self-pay | Admitting: Cardiovascular Disease

## 2022-05-25 ENCOUNTER — Ambulatory Visit: Payer: Medicare HMO | Attending: Cardiovascular Disease | Admitting: Cardiovascular Disease

## 2022-05-25 VITALS — BP 112/80 | HR 81 | Ht 72.0 in | Wt 398.0 lb

## 2022-05-25 DIAGNOSIS — Z8639 Personal history of other endocrine, nutritional and metabolic disease: Secondary | ICD-10-CM

## 2022-05-25 DIAGNOSIS — E785 Hyperlipidemia, unspecified: Secondary | ICD-10-CM

## 2022-05-25 DIAGNOSIS — I2510 Atherosclerotic heart disease of native coronary artery without angina pectoris: Secondary | ICD-10-CM

## 2022-05-25 DIAGNOSIS — E039 Hypothyroidism, unspecified: Secondary | ICD-10-CM | POA: Diagnosis not present

## 2022-05-25 DIAGNOSIS — Z01810 Encounter for preprocedural cardiovascular examination: Secondary | ICD-10-CM | POA: Diagnosis not present

## 2022-05-25 MED ORDER — METOPROLOL SUCCINATE ER 25 MG PO TB24: 25.0000 mg | ORAL_TABLET | Freq: Every day | ORAL | 3 refills | Status: DC

## 2022-05-25 MED ORDER — CLOPIDOGREL BISULFATE 75 MG PO TABS: 75.0000 mg | ORAL_TABLET | Freq: Every day | ORAL | 3 refills | Status: DC

## 2022-05-25 MED ORDER — ATORVASTATIN CALCIUM 20 MG PO TABS: 20.0000 mg | ORAL_TABLET | Freq: Every day | ORAL | 3 refills | Status: DC

## 2022-05-25 NOTE — Patient Instructions (Addendum)
Medication Instructions:   Stop taking Plavix ( clopidogrel ) on Dec 28 ,2023  Stop Aspirin on Dec 28  as well but restart after your procedure sis completed.   Stop taking Metoprolol tartrate Stop taking Rosuvastatin   Start taking Metoprolol succinate ( Toprol Xl ) 25 mg one tablet daily  Start taking Atorvastatin 20 mg  one tablet daily     *If you need a refill on your cardiac medications before your next appointment, please call your pharmacy*   Lab Work: Lipid in 3 months ( march 2024)  fasting   If you have labs (blood work) drawn today and your tests are completely normal, you will receive your results only by: [place] (if you have MyChart) OR A paper copy in the mail If you have any lab test that is abnormal or we need to change your treatment, we will call you to review the results.   Testing/Procedures:  Not needed  Follow-Up: At [facility], you and your health needs are our priority.  As part of our continuing mission to provide you with exceptional heart care, we have created designated Provider Care Teams.  These Care Teams include your primary Cardiologist (physician) and Advanced Practice Providers (APPs -  Physician Assistants and Nurse Practitioners) who all work together to provide you with the care you need, when you need it.     Your next appointment:   3 month(s)  The format for your next appointment:   In Person  Provider:   Sanda Klein, MD    Other Instructions    You are cleared for surgery/procedure  from a cardiac standpoint

## 2022-05-25 NOTE — Progress Notes (Signed)
[facility] Office Note:    Date:  06/01/2022   ID:  [name] [name], DOB 06-Jul-1965, MRN [id number]  PCP:  [name], MD   [facility] Cardiologist:  Sanda Klein, MD     Referring MD: [name], MD   Chief Complaint  Patient presents with   Coronary Artery Disease   History of Present Illness:    [name] [name] is a 56 y.o. 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  He has not had any angina pectoris since his last appointment.  He is on isosorbide for some postprocedure chest discomfort presumably due to jailing of the diagonal branch.  He is on chronic dual antiplatelet therapy and has not had any serious bleeding problems.  He misunderstood the instructions to wean off the isosorbide and instead stopped his metoprolol.  He has not had shortness of breath.  He has muscle weakness due to rosuvastatin (it improved when he started and recurred with rechallenge).  He was hospitalized for a few days earlier this month for nephrolithiasis with a ureteral stone, complicated by urinary tract infection, coronary stent placement.  This is the first time he has had a kidney stone about 6 or 7 years.  There were no cardiac issues during that hospitalization.  He is scheduled for ureteroscopy and exchange of his left ureteral stent on January 4.  The patient specifically denies any chest pain at rest exertion, dyspnea at rest or with exertion, orthopnea, paroxysmal nocturnal dyspnea, syncope, palpitations, focal neurological deficits, intermittent claudication, lower extremity edema, unexplained weight gain, cough, hemoptysis or  wheezing.  Presenting EKG today sinus rhythm with first-degree AV block and nonspecific ST-T changes.  [name] [name] is limited to transition by sliding from his electric scooter to bed or commode.  He cannot walk.  About 20 years ago he was slim and used to be a runner but then developed steady weight gain, had to undergo multiple colon surgeries, complicated by a protracted course with abdominal wound infection requiring prolonged antibiotics.  He developed sequential bilateral Achilles tendon rupture, probably due to prolonged treatment with Cipro.  He was subsequently diagnosed with Cushing syndrome, which further limited his ability to be active due to proximal muscle weakness and atrophy.  Because of this, his ability to perform any type of physical exertion is severely limited.  He had normal ABIs on 06/15/2021.  An echocardiogram in November 2015 describes an EF of 45-50% with inferior wall hypokinesis.  He has normal renal function.  He does not have diabetes mellitus but is taking Ozempic for weight loss and has managed to lose 60 pounds since therapy initiation.  His lipid profile showed an LDL cholesterol of 84 and he has started on rosuvastatin 5 mg daily.  Labs on 07/11/2021 showed that his LDL is now down to 64, but HDL is also lower at 35.  His paternal grandfather (smoker) died of a myocardial infarction at age 38 and he has 2 paternal uncles that also died of myocardial infarction, albeit at more advanced ages.  His grandmother also had coronary disease despite a very healthy lifestyle and lean body habitus.  Past Medical History:  Diagnosis Date   Allergic rhinitis    Anxiety    Arthritis  Depression    Diastolic CHF ([place])    Gout    H/O acute respiratory failure    History of kidney stones    Hypertension    Morbid obesity ([place])    Peripheral neuropathy    Pituitary tumor    PONV (postoperative nausea and vomiting)    Sleep apnea    uses bi-pap    Past Surgical History:   Procedure Laterality Date   ABDOMINAL SURGERY  06/2013   for cyst with MRSA   ACHILLES TENDON REPAIR Bilateral    APPLICATION OF A-CELL OF CHEST/ABDOMEN N/A 01/26/2014   Procedure: PLACEMENT OF A CELL AND VAC ;  Surgeon: [name], DO;  Location: [place];  Service: Plastics;  Laterality: N/A;   APPLICATION OF A-CELL OF CHEST/ABDOMEN N/A 02/02/2014   Procedure: WITH PLACEMENT OF A CELL AND VAC ;  Surgeon: [name], DO;  Location: [place];  Service: Plastics;  Laterality: N/A;   BRAIN SURGERY     COLON RESECTION     CORONARY STENT INTERVENTION N/A 07/28/2021   Procedure: CORONARY STENT INTERVENTION;  Surgeon: Martinique, Peter M, MD;  Location: [place] [facility];  Service: Cardiovascular;  Laterality: N/A;   CRANIOTOMY N/A 12/10/2013   Procedure: Transpheniodal resection of Pituitary tumor with Dr. Radene Journey for approach;  Surgeon: Floyce Stakes, MD;  Location: [facility];  Service: Neurosurgery;  Laterality: N/A;  Transpheniodal resection of Pituitary tumor with Dr. Radene Journey for approach   CYSTOSCOPY WITH RETROGRADE PYELOGRAM, URETEROSCOPY AND STENT PLACEMENT Left 05/09/2022   Procedure: [place] AND STENT PLACEMENT;  Surgeon: Ardis Hughs, MD;  Location: [facility];  Service: Urology;  Laterality: Left;   HERNIA REPAIR     INCISION AND DRAINAGE OF WOUND N/A 01/26/2014   Procedure: IRRIGATION AND DEBRIDEMENT ABDOMINAL WOUND WITH ;  Surgeon: [name], DO;  Location: [place];  Service: Plastics;  Laterality: N/A;   INCISION AND DRAINAGE OF WOUND N/A 02/02/2014   Procedure: IRRIGATION AND DEBRIDEMENT ABDOMINAL WOUND;  Surgeon: [name], DO;  Location: [place];  Service: Plastics;  Laterality: N/A;   INCISION AND DRAINAGE OF WOUND N/A 02/12/2014   Procedure: IRRIGATION AND DEBRIDEMENT OF ABDOMINAL WOUND/PLACEMENT OF A-CELL AND VAC;  Surgeon: [name], DO;  Location: [place];  Service: Plastics;  Laterality: N/A;   INCISION AND DRAINAGE OF WOUND N/A  04/01/2014   Procedure: IRRIGATION AND DEBRIDEMENT ABDOMINAL WOUND WITH PLACEMENT OF ACELL/VAC;  Surgeon: [name], DO;  Location: [place];  Service: Plastics;  Laterality: N/A;   LEFT HEART CATH AND CORONARY ANGIOGRAPHY N/A 07/28/2021   Procedure: LEFT HEART CATH AND CORONARY ANGIOGRAPHY;  Surgeon: Martinique, Peter M, MD;  Location: [place] [facility];  Service: Cardiovascular;  Laterality: N/A;   Sigmoid Colonoscopy     TRANSNASAL APPROACH N/A 12/10/2013   Procedure: TRANSNASAL APPROACH;  Surgeon: Rozetta Nunnery, MD;  Location: [facility];  Service: ENT;  Laterality: N/A;    Current Medications: Current Meds  Medication Sig   allopurinol (ZYLOPRIM) 300 MG tablet Take 300 mg by mouth daily.   aspirin 81 MG chewable tablet Chew 81 mg by mouth daily.   atorvastatin (LIPITOR) 20 MG tablet Take 1 tablet (20 mg total) by mouth daily.   buPROPion (WELLBUTRIN XL) 300 MG 24 hr tablet Take 300 mg by mouth daily.   Diclofenac-miSOPROStol 75-0.2 MG TBEC Take 1 tablet by mouth 2 (two) times daily.    hydrocortisone (CORTEF) 20 MG tablet Take  20 mg by mouth daily.   isosorbide mononitrate (IMDUR) 30 MG 24 hr tablet Take 1 tablet (30 mg total) by mouth daily.   levothyroxine (SYNTHROID) 125 MCG tablet Take 125 mcg by mouth daily before breakfast.   metoprolol succinate (TOPROL-XL) 25 MG 24 hr tablet Take 1 tablet (25 mg total) by mouth daily.   [EXPIRED] nystatin (MYCOSTATIN/NYSTOP) powder Apply topically 2 (two) times daily for 14 days. Apply to folds of groin and abdomen.   pantoprazole (PROTONIX) 40 MG tablet Take 40 mg by mouth daily.   sertraline (ZOLOFT) 100 MG tablet Take 150 mg by mouth daily.   tamsulosin (FLOMAX) 0.4 MG CAPS capsule Take 0.4 mg by mouth daily.   Tiotropium Bromide-Olodaterol (STIOLTO RESPIMAT) 2.5-2.5 MCG/ACT AERS Inhale 2 puffs into the lungs 2 (two) times daily for 7 days, THEN 2 puffs every 6 (six) hours as needed for up to 7 days. (Patient not taking: Reported on  05/30/2022)   traMADol (ULTRAM) 50 MG tablet Take 50-100 mg by mouth at bedtime as needed for moderate pain.   zolpidem (AMBIEN) 10 MG tablet Take 1 tablet (10 mg total) by mouth at bedtime as needed for sleep. (Patient taking differently: Take 10 mg by mouth at bedtime.)   [DISCONTINUED] Ciprofloxacin (CIPRO PO) Take 1 tablet by mouth in the morning and at bedtime.   [DISCONTINUED] clopidogrel (PLAVIX) 75 MG tablet Take 1 tablet (75 mg total) by mouth daily with breakfast.   [DISCONTINUED] rosuvastatin (CRESTOR) 20 MG tablet TAKE 1 TABLET DAILY     Allergies:   Morphine and Penicillins   Social History   Socioeconomic History   Marital status: Single    Spouse name: Not on file   Number of children: Not on file   Years of education: Not on file   Highest education level: Not on file  Occupational History   Not on file  Tobacco Use   Smoking status: Never   Smokeless tobacco: Never  Vaping Use   Vaping Use: Never used  Substance and Sexual Activity   Alcohol use: No   Drug use: No   Sexual activity: Never  Other Topics Concern   Not on file  Social History Narrative   Not on file   Social Determinants of Health   Financial Resource Strain: Not on file  Food Insecurity: No Food Insecurity (05/10/2022)   Hunger Vital Sign    Worried About Running Out of Food in the Last Year: Never true    Ran Out of Food in the Last Year: Never true  Transportation Needs: No Transportation Needs (05/10/2022)   PRAPARE - Hydrologist (Medical): No    Lack of Transportation (Non-Medical): No  Physical Activity: Not on file  Stress: Not on file  Social Connections: Not on file     Family History: The patient's family history includes Cancer in his maternal aunt and maternal grandmother; Gout in his brother and father; Hypertension in his father and mother; Hypothyroidism in his father.  ROS:   Please see the history of present illness.     All other systems  reviewed and are negative.  EKGs/Labs/Other Studies Reviewed:    The following studies were reviewed today:  Echocardiogram 12/19/2013  - Left ventricle: The cavity size was normal. Wall thickness was    increased in a pattern of mild LVH. Systolic function was mildly    reduced. The estimated ejection fraction was in the range of 45%  to 50%. There is hypokinesis of the inferior myocardium.  - Mitral valve: There was mild regurgitation.  - Right ventricle: The cavity size was moderately dilated. Wall    thickness was normal. Systolic function was mildly reduced.     Cardiac catheterization 07/28/2021    Prox LAD to Mid LAD lesion is 85% stenosed.   1st Mrg lesion is 25% stenosed.   Prox RCA lesion is 25% stenosed.   A drug-eluting stent was successfully placed using a STENT ONYX FRONTIER 4.0X18.   Post intervention, there is a 0% residual stenosis.   The left ventricular systolic function is normal.   LV end diastolic pressure is mildly elevated.   The left ventricular ejection fraction is 55-65% by visual estimate.   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 The vessel is tortuous proximally. There is diffuse ectasia of the mid vessel Normal LV function Mildly elevated LVEDP Successful PCI of the proximal LAD with DES.  Complicated by side branch occlusion of small diagonal branch   Plan: will treat with IV Ntg. Observe overnight. Plan DAPT for one year. Anticipate DC tomorrow if stable.    Diagnostic Dominance: Right Intervention  Implants     Permanent Stent  Stent Onyx Frontier 4.0x18     EKG:  EKG is ordered today, personally reviewed, shows normal sinus rhythm and is a completely normal tracing.  Recent Labs: 05/09/2022: ALT 28 05/16/2022: Magnesium 2.3 05/17/2022: BUN 16; Creatinine, Ser 1.15; Hemoglobin 11.7; Platelets 143; Potassium 4.0; Sodium 141  07/11/2021 Creatinine 1.00, BUN 15, potassium 4.3 A.m. cortisol 2.1 Hemoglobin A1c  5.1%, TSH 0.02, free T4 normal 1.7  Recent Lipid Panel    Component Value Date/Time   TRIG 174 (H) 12/10/2013 2300  07/11/2021 cholesterol 120, HDL 35, LDL 64, triglycerides 102    Risk Assessment/Calculations:           Physical Exam:    VS:  BP 112/80 (BP Location: Left Arm, Patient Position: Sitting, Cuff Size: Large)   Pulse 81   Ht 6' (1.829 m)   Wt (!) 398 lb (180.5 kg)   SpO2 96%   BMI 53.98 kg/m     Wt Readings from Last 3 Encounters:  05/31/22 (!) 398 lb (180.5 kg)  05/25/22 (!) 398 lb (180.5 kg)  05/09/22 (!) 396 lb 13.3 oz (180 kg)     General: Alert, oriented x3, no distress, physical exam is limited by morbid obesity.  In wheelchair. Head: no evidence of trauma, PERRL, EOMI, no exophtalmos or lid lag, no myxedema, no xanthelasma; normal ears, nose and oropharynx Neck: normal jugular venous pulsations and no hepatojugular reflux; brisk carotid pulses without delay and no carotid bruits Chest: clear to auscultation, no signs of consolidation by percussion or palpation, normal fremitus, symmetrical and full respiratory excursions Cardiovascular: normal position and quality of the apical impulse, regular rhythm, normal first and second heart sounds, no murmurs, rubs or gallops Abdomen: no tenderness or distention, no masses by palpation, no abnormal pulsatility or arterial bruits, normal bowel sounds, no hepatosplenomegaly Extremities: no clubbing, cyanosis or edema; 2+ radial, ulnar and brachial pulses bilaterally; 2+ right femoral, posterior tibial and dorsalis pedis pulses; 2+ left femoral, posterior tibial and dorsalis pedis pulses; no subclavian or femoral bruits Neurological: grossly nonfocal Psych: Normal mood and affect     ASSESSMENT:    1. Coronary artery disease involving native coronary artery of native heart without angina pectoris   2. Dyslipidemia (high LDL; low HDL)   3. Pre-operative  cardiovascular examination   4. Super-super obese ([place])    5. History of adrenal insufficiency   6. Acquired hypothyroidism      PLAN:    In order of problems listed above:  CAD: Asymptomatic.  His stent was placed electively, not during acute coronary syndrome.  He had some brief angina probably due to jailing of the second diagonal artery but this has not persisted. He can stop the isosorbide mononitrate. Continue metoprolol. Stop DAPT for the urology procedure, resume ASA only after that (>10 months from elective LAD PCI-stent). HLP: seems to be having muscle side effects with rosuvastatin. Try atorvastatin and recheck lipids in 3 months. If similar side effects, switch to PCSK9 inh. Superobesity: On Ozempic.  Due to his weight and his mobility issues, he cannot participate in cardiac rehab, unfortunately. Adrenal insufficiency status post pituitary resection: On adrenal supplement. May need stress doses of hydrocortisone if he receives general anesthesia or has complications. Acquired hypothyroidism: On levothyroxine supplementation. Preop CV evaluation: low risk for cardiac issues, but increased risk of issues with anesthesia due to super obesity and adrenal insufficiency.   Medication Adjustments/Labs and Tests Ordered: Current medicines are reviewed at length with the patient today.  Concerns regarding medicines are outlined above.  Orders Placed This Encounter  Procedures   Lipid panel   EKG 12-Lead   Meds ordered this encounter  Medications   clopidogrel (PLAVIX) 75 MG tablet    Sig: Take 1 tablet (75 mg total) by mouth daily with breakfast. Stop taking on Dec 28 , 2024    Dispense:  90 tablet    Refill:  3   atorvastatin (LIPITOR) 20 MG tablet    Sig: Take 1 tablet (20 mg total) by mouth daily.    Dispense:  90 tablet    Refill:  3    Discontinue  rosuvastatin   metoprolol succinate (TOPROL-XL) 25 MG 24 hr tablet    Sig: Take 1 tablet (25 mg total) by mouth daily.    Dispense:  90 tablet    Refill:  3    Discontinue  metoprolol tartrate    Patient Instructions  Medication Instructions:   Stop taking Plavix ( clopidogrel ) on Dec 28 ,2023  Stop Aspirin on Dec 28  as well but restart after your procedure sis completed.   Stop taking Metoprolol tartrate Stop taking Rosuvastatin   Start taking Metoprolol succinate ( Toprol Xl ) 25 mg one tablet daily  Start taking Atorvastatin 20 mg  one tablet daily     *If you need a refill on your cardiac medications before your next appointment, please call your pharmacy*   Lab Work: Lipid in 3 months ( march 2024)  fasting   If you have labs (blood work) drawn today and your tests are completely normal, you will receive your results only by: [place] (if you have MyChart) OR A paper copy in the mail If you have any lab test that is abnormal or we need to change your treatment, we will call you to review the results.   Testing/Procedures:  Not needed  Follow-Up: At [facility], you and your health needs are our priority.  As part of our continuing mission to provide you with exceptional heart care, we have created designated Provider Care Teams.  These Care Teams include your primary Cardiologist (physician) and Advanced Practice Providers (APPs -  Physician Assistants and Nurse Practitioners) who all work together to provide you with the care you need, when you  need it.     Your next appointment:   3 month(s)  The format for your next appointment:   In Person  Provider:   Sanda Klein, MD    Other Instructions    You are cleared for surgery/procedure  from a cardiac standpoint   Signed, Sanda Klein, MD  06/01/2022 10:43 AM    [place]

## 2022-05-30 NOTE — Patient Instructions (Signed)
DUE TO COVID-19 ONLY TWO VISITORS  (aged 58 and older)  ARE ALLOWED TO COME WITH YOU AND STAY IN THE WAITING ROOM ONLY DURING PRE OP AND PROCEDURE.   **NO VISITORS ARE ALLOWED IN THE SHORT STAY AREA OR RECOVERY ROOM!!**  IF YOU WILL BE ADMITTED INTO THE HOSPITAL YOU ARE ALLOWED ONLY FOUR SUPPORT PEOPLE DURING VISITATION HOURS ONLY (7 AM -8PM)   The support person(s) must pass our screening, gel in and out, and wear a mask at all times, including in the patient's room. Patients must also wear a mask when staff or their support person are in the room. Visitors GUEST BADGE MUST BE WORN VISIBLY  One adult visitor may remain with you overnight and MUST be in the room by 8 P.M.     Your procedure is scheduled on: 06/08/22   Report to [facility] Main Entrance    Report to admitting at : 9:15 AM   Call this number if you have problems the morning of surgery [phone]   Do not eat food :After Midnight.   After Midnight you may have the following liquids until : 8:30 AM DAY OF SURGERY  Water Black Coffee (sugar ok, NO MILK/CREAM OR CREAMERS)  Tea (sugar ok, NO MILK/CREAM OR CREAMERS) regular and decaf                             Plain Jell-O (NO RED)                                           Fruit ices (not with fruit pulp, NO RED)                                     Popsicles (NO RED)                                                                  Juice: apple, WHITE grape, WHITE cranberry Sports drinks like Gatorade (NO RED)    Oral Hygiene is also important to reduce your risk of infection.                                    Remember - BRUSH YOUR TEETH THE MORNING OF SURGERY WITH YOUR REGULAR TOOTHPASTE  DENTURES WILL BE REMOVED PRIOR TO SURGERY PLEASE DO NOT APPLY "Poly grip" OR ADHESIVES!!!   Do NOT smoke after Midnight   Take these medicines the morning of surgery with A SIP OF WATER: isosorbide,metoprolol,bupropion,sertraline,allopurinol,synthroid,pantoprazole.Use  inhalers as usual.  DO NOT TAKE THE FOLLOWING 7 DAYS PRIOR TO SURGERY: Ozempic, Wegovy, Rybelsus (Semaglutide), Byetta (exenatide), Bydureon (exenatide ER), Victoza, Saxenda (liraglutide), or Trulicity (dulaglutide) Mounjaro (Tirzepatide) Adlyxin (Lixisenatide), Polyethylene Glycol Loxenatide.  Bring CPAP mask and tubing day of surgery.                              You may not have any  metal on your body including hair pins, jewelry, and body piercing             Do not wear lotions, powders, perfumes/cologne, or deodorant              Men may shave face and neck.   Do not bring valuables to the hospital. [place].   Contacts, glasses, or bridgework may not be worn into surgery.   Bring small overnight bag day of surgery.   DO NOT [place]. PHARMACY WILL DISPENSE MEDICATIONS LISTED ON YOUR MEDICATION LIST TO YOU DURING YOUR ADMISSION [place]!    Patients discharged on the day of surgery will not be allowed to drive home.  Someone NEEDS to stay with you for the first 24 hours after anesthesia.   Special Instructions: Bring a copy of your healthcare power of attorney and living will documents         the day of surgery if you haven't scanned them before.              Please read over the following fact sheets you were given: IF YOU HAVE QUESTIONS ABOUT YOUR PRE-OP INSTRUCTIONS PLEASE CALL [phone]    [facility] - Preparing for Surgery Before surgery, you can play an important role.  Because skin is not sterile, your skin needs to be as free of germs as possible.  You can reduce the number of germs on your skin by washing with CHG (chlorahexidine gluconate) soap before surgery.  CHG is an antiseptic cleaner which kills germs and bonds with the skin to continue killing germs even after washing. Please DO NOT use if you have an allergy to CHG or antibacterial soaps.  If your skin becomes  reddened/irritated stop using the CHG and inform your nurse when you arrive at Short Stay. Do not shave (including legs and underarms) for at least 48 hours prior to the first CHG shower.  You may shave your face/neck. Please follow these instructions carefully:  1.  Shower with CHG Soap the night before surgery and the  morning of Surgery.  2.  If you choose to wash your hair, wash your hair first as usual with your  normal  shampoo.  3.  After you shampoo, rinse your hair and body thoroughly to remove the  shampoo.                           4.  Use CHG as you would any other liquid soap.  You can apply chg directly  to the skin and wash                       Gently with a scrungie or clean washcloth.  5.  Apply the CHG Soap to your body ONLY FROM THE NECK DOWN.   Do not use on face/ open                           Wound or open sores. Avoid contact with eyes, ears mouth and genitals (private parts).                       Wash face,  Genitals (private parts) with your normal soap.  6.  Wash thoroughly, paying special attention to the area where your surgery  will be performed.  7.  Thoroughly rinse your body with warm water from the neck down.  8.  DO NOT shower/wash with your normal soap after using and rinsing off  the CHG Soap.                9.  Pat yourself dry with a clean towel.            10.  Wear clean pajamas.            11.  Place clean sheets on your bed the night of your first shower and do not  sleep with pets. Day of Surgery : Do not apply any lotions/deodorants the morning of surgery.  Please wear clean clothes to the hospital/surgery center.  FAILURE TO FOLLOW THESE INSTRUCTIONS MAY RESULT IN THE CANCELLATION OF YOUR SURGERY PATIENT SIGNATURE_________________________________  NURSE SIGNATURE__________________________________  ________________________________________________________________________

## 2022-05-31 ENCOUNTER — Encounter (HOSPITAL_COMMUNITY): Payer: Self-pay

## 2022-05-31 ENCOUNTER — Other Ambulatory Visit: Payer: Self-pay

## 2022-05-31 ENCOUNTER — Encounter (HOSPITAL_COMMUNITY)
Admission: RE | Admit: 2022-05-31 | Discharge: 2022-05-31 | Disposition: A | Discharge status: Home / Self Care | Payer: Medicare HMO | Source: Ambulatory Visit | Attending: Urology | Admitting: Urology

## 2022-05-31 DIAGNOSIS — G4730 Sleep apnea, unspecified: Secondary | ICD-10-CM | POA: Diagnosis not present

## 2022-05-31 DIAGNOSIS — N201 Calculus of ureter: Secondary | ICD-10-CM | POA: Insufficient documentation

## 2022-05-31 DIAGNOSIS — I110 Hypertensive heart disease with heart failure: Secondary | ICD-10-CM | POA: Diagnosis not present

## 2022-05-31 DIAGNOSIS — I2510 Atherosclerotic heart disease of native coronary artery without angina pectoris: Secondary | ICD-10-CM | POA: Insufficient documentation

## 2022-05-31 DIAGNOSIS — Z01812 Encounter for preprocedural laboratory examination: Secondary | ICD-10-CM | POA: Diagnosis not present

## 2022-05-31 DIAGNOSIS — I5032 Chronic diastolic (congestive) heart failure: Secondary | ICD-10-CM | POA: Insufficient documentation

## 2022-05-31 HISTORY — DX: Personal history of urinary calculi: Z87.442

## 2022-05-31 NOTE — Progress Notes (Signed)
For Short Stay: [place] appointment date:  Bowel Prep reminder:   For Anesthesia: PCP - Dr. [name] Cardiologist - Dr. Sanda Klein. LOV: 05/25/22: Clearance  Chest x-ray - 05/08/22 EKG - 05/25/22 Stress Test -  ECHO -  Cardiac Cath - 07/28/21 Pacemaker/ICD device last checked: Pacemaker orders received: Device Rep notified:  Spinal Cord Stimulator:  Sleep Study - yES CPAP - yES  Fasting Blood Sugar - n/a Checks Blood Sugar __0___ times a day Date and result of last Hgb A1c- 5.2  Last dose of GLP1 agonist- On Hold. Semaglutide: Last dose:05/14/22 GLP1 instructions:   Last dose of SGLT-2 inhibitors-  SGLT-2 instructions:   Blood Thinner Instructions: Plavix: will be hold 5 days before surgery: Dr. Louis Meckel. Aspirin Instructions: Will be on hold 5 days before surgery: Dr. Louis Meckel Last Dose:  Activity level: Can go up a flight of stairs and activities of daily living without stopping and without chest pain and/or shortness of breath   Able to exercise without chest pain and/or shortness of breath   Unable to go up a flight of stairs without chest pain and/or shortness of breath     Anesthesia review: Hx: htn,chf,osa(cpap).  Patient denies shortness of breath, fever, cough and chest pain at PAT appointment   Patient verbalized understanding of instructions that were given to them at the PAT appointment. Patient was also instructed that they will need to review over the PAT instructions again at home before surgery.

## 2022-06-01 ENCOUNTER — Encounter (HOSPITAL_COMMUNITY): Payer: Self-pay | Admitting: Physician Assistant

## 2022-06-01 NOTE — Progress Notes (Signed)
Anesthesia Chart Review   Case: [id number] Date/Time: 06/08/22 1115   Procedure: LEFT URETEROSCOPY/HOLMIUM LASER/LEFT URETERAL STENT EXCHANGE (Left) - 75 MINUTES NEEEDED FOR CASE   Anesthesia type: General   Pre-op diagnosis: LEFT URETERAL STONE   Location: [facility] PROC[facility] / [facility]   Surgeons: Ardis Hughs, MD       DISCUSSION:56 y.o. never smoker with h/o PONV, HTN, sleep apnea, CAD (PCI-DES proximal LAD 70/62/3762), diastolic CHF, left ureteral stone scheduled for above procedure 06/08/2022 with Dr. Louis Meckel.   Pt last seen by cardiology 05/25/2022. Per OV note, "Preop CV evaluation: low risk for cardiac issues, but increased risk of issues with anesthesia due to super obesity and adrenal insufficiency."  S/p cytoscopy 05/09/2022 with no anesthesia complications noted.  VS: BP (!) 153/95   Pulse 61   Temp 36.8 C (Oral)   Ht 6' (1.829 m)   Wt (!) 180.5 kg   SpO2 98%   BMI 53.98 kg/m   PROVIDERS: [name], MD is PCP   Cardiologist:  Sanda Klein, MD     LABS: Labs reviewed: Acceptable for surgery. (all labs ordered are listed, but only abnormal results are displayed)  Labs Reviewed - No data to display   IMAGES:   EKG:   CV:  Past Medical History:  Diagnosis Date   Allergic rhinitis    Anxiety    Arthritis    Depression    Diastolic CHF ([place])    Gout    H/O acute respiratory failure    History of kidney stones    Hypertension    Morbid obesity ([place])    Peripheral neuropathy    Pituitary tumor    PONV (postoperative nausea and vomiting)    Sleep apnea    uses bi-pap    Past Surgical History:  Procedure Laterality Date   ABDOMINAL SURGERY  06/2013   for cyst with MRSA   ACHILLES TENDON REPAIR Bilateral    APPLICATION OF A-CELL OF CHEST/ABDOMEN N/A 01/26/2014   Procedure: PLACEMENT OF A CELL AND VAC ;  Surgeon: [name], DO;  Location: [place];  Service: Plastics;  Laterality: N/A;   APPLICATION OF A-CELL OF CHEST/ABDOMEN N/A  02/02/2014   Procedure: WITH PLACEMENT OF A CELL AND VAC ;  Surgeon: [name], DO;  Location: [place];  Service: Plastics;  Laterality: N/A;   BRAIN SURGERY     COLON RESECTION     CORONARY STENT INTERVENTION N/A 07/28/2021   Procedure: CORONARY STENT INTERVENTION;  Surgeon: Martinique, Peter M, MD;  Location: [place] CV LAB;  Service: Cardiovascular;  Laterality: N/A;   CRANIOTOMY N/A 12/10/2013   Procedure: Transpheniodal resection of Pituitary tumor with Dr. Radene Journey for approach;  Surgeon: Floyce Stakes, MD;  Location: [facility] NEURO ORS;  Service: Neurosurgery;  Laterality: N/A;  Transpheniodal resection of Pituitary tumor with Dr. Radene Journey for approach   CYSTOSCOPY WITH RETROGRADE PYELOGRAM, URETEROSCOPY AND STENT PLACEMENT Left 05/09/2022   Procedure: [place] AND STENT PLACEMENT;  Surgeon: Ardis Hughs, MD;  Location: [facility];  Service: Urology;  Laterality: Left;   HERNIA REPAIR     INCISION AND DRAINAGE OF WOUND N/A 01/26/2014   Procedure: IRRIGATION AND DEBRIDEMENT ABDOMINAL WOUND WITH ;  Surgeon: [name], DO;  Location: [place];  Service: Plastics;  Laterality: N/A;   INCISION AND DRAINAGE OF WOUND N/A 02/02/2014   Procedure: IRRIGATION AND DEBRIDEMENT ABDOMINAL WOUND;  Surgeon: [name], DO;  Location: [place];  Service: Clinical cytogeneticist;  Laterality: N/A;   INCISION AND DRAINAGE OF WOUND N/A 02/12/2014   Procedure: IRRIGATION AND DEBRIDEMENT OF ABDOMINAL WOUND/PLACEMENT OF A-CELL AND VAC;  Surgeon: [name], DO;  Location: [place];  Service: Plastics;  Laterality: N/A;   INCISION AND DRAINAGE OF WOUND N/A 04/01/2014   Procedure: IRRIGATION AND DEBRIDEMENT ABDOMINAL WOUND WITH PLACEMENT OF ACELL/VAC;  Surgeon: [name], DO;  Location: [place];  Service: Plastics;  Laterality: N/A;   LEFT HEART CATH AND CORONARY ANGIOGRAPHY N/A 07/28/2021   Procedure: LEFT HEART CATH AND CORONARY ANGIOGRAPHY;  Surgeon: Martinique, Peter M, MD;  Location: [place]  CV LAB;  Service: Cardiovascular;  Laterality: N/A;   Sigmoid Colonoscopy     TRANSNASAL APPROACH N/A 12/10/2013   Procedure: TRANSNASAL APPROACH;  Surgeon: Rozetta Nunnery, MD;  Location: [facility] NEURO ORS;  Service: ENT;  Laterality: N/A;    MEDICATIONS:  acetaminophen (TYLENOL) 325 MG tablet   allopurinol (ZYLOPRIM) 300 MG tablet   ALPRAZolam (XANAX) 0.25 MG tablet   aspirin 81 MG chewable tablet   atorvastatin (LIPITOR) 20 MG tablet   buPROPion (WELLBUTRIN XL) 300 MG 24 hr tablet   Calcium Carbonate Antacid (TUMS PO)   carboxymethylcellulose (REFRESH PLUS) 0.5 % SOLN   clopidogrel (PLAVIX) 75 MG tablet   cyclobenzaprine (FLEXERIL) 10 MG tablet   diclofenac sodium (VOLTAREN) 1 % GEL   Diclofenac-miSOPROStol 75-0.2 MG TBEC   fluticasone (FLONASE) 50 MCG/ACT nasal spray   hydrocortisone (CORTEF) 20 MG tablet   isosorbide mononitrate (IMDUR) 30 MG 24 hr tablet   levothyroxine (SYNTHROID) 125 MCG tablet   metoprolol succinate (TOPROL-XL) 25 MG 24 hr tablet   pantoprazole (PROTONIX) 40 MG tablet   pseudoephedrine (SUDAFED) 120 MG 12 hr tablet   rOPINIRole (REQUIP) 0.5 MG tablet   Semaglutide, 1 MG/DOSE, (OZEMPIC, 1 MG/DOSE,) 4 MG/3ML SOPN   sertraline (ZOLOFT) 100 MG tablet   tamsulosin (FLOMAX) 0.4 MG CAPS capsule   Tiotropium Bromide-Olodaterol (STIOLTO RESPIMAT) 2.5-2.5 MCG/ACT AERS   traMADol (ULTRAM) 50 MG tablet   zolpidem (AMBIEN) 10 MG tablet   No current facility-administered medications for this encounter.     Konrad Felix Ward, PA-C [facility]-Surgical Testing [phone]

## 2022-06-02 DIAGNOSIS — G4733 Obstructive sleep apnea (adult) (pediatric): Secondary | ICD-10-CM | POA: Diagnosis not present

## 2022-06-02 DIAGNOSIS — M545 Low back pain, unspecified: Secondary | ICD-10-CM | POA: Diagnosis not present

## 2022-06-02 DIAGNOSIS — G8929 Other chronic pain: Secondary | ICD-10-CM | POA: Diagnosis not present

## 2022-06-02 DIAGNOSIS — J302 Other seasonal allergic rhinitis: Secondary | ICD-10-CM | POA: Diagnosis not present

## 2022-06-02 DIAGNOSIS — E240 Pituitary-dependent Cushing's disease: Secondary | ICD-10-CM | POA: Diagnosis not present

## 2022-06-07 ENCOUNTER — Telehealth: Payer: Self-pay | Admitting: Cardiovascular Disease

## 2022-06-07 NOTE — Telephone Encounter (Signed)
Spoke with pt, he reports the procedure has been cancelled for tomorrow because he is very congested and has a cough. The pain he had was kind of sharpe and only lasted maybe 1 minute. It occurred when he was lying in bed and he reports he thinks it could be related to the cough. He does not have aqny other symptoms and has not had anymore pain. He was told to restart the plavix since his procedure was cx. Patient voiced understanding to make sure to let us know if he contimues to have discomfort.

## 2022-06-07 NOTE — Telephone Encounter (Signed)
  Pt c/o of Chest Pain: STAT if CP now or developed within 24 hours  1. Are you having CP right now?   2. Are you experiencing any other symptoms (ex. SOB, nausea, vomiting, sweating)? None   3. How long have you been experiencing CP? Last nigh 9:30 pm  4. Is your CP continuous or coming and going?   5. Have you taken Nitroglycerin? No  ?  Pt said, last night he felt a short pain on the left side. He described it as a "Funny" feeling on the left side of his chest that lasted 2 mins. Denied SOB nausea, vomiting, sweating. He wants to know if this is because he stopped his Plavix for 5 days for his upcoming procedure and if its ok to go through with his procedure tomorrow

## 2022-06-07 NOTE — Telephone Encounter (Signed)
Left message for pt to call.

## 2022-06-08 ENCOUNTER — Ambulatory Visit (HOSPITAL_COMMUNITY): Admission: RE | Admit: 2022-06-08 | Payer: Medicare HMO | Source: Home / Self Care | Admitting: Urology

## 2022-06-08 ENCOUNTER — Encounter (HOSPITAL_COMMUNITY): Admission: RE | Payer: Self-pay | Source: Home / Self Care

## 2022-06-08 SURGERY — CYSTOSCOPY/URETEROSCOPY/HOLMIUM LASER/STENT PLACEMENT
Anesthesia: General | Laterality: Left

## 2022-07-05 DIAGNOSIS — I10 Essential (primary) hypertension: Secondary | ICD-10-CM | POA: Diagnosis not present

## 2022-07-05 DIAGNOSIS — N200 Calculus of kidney: Secondary | ICD-10-CM | POA: Diagnosis not present

## 2022-07-06 ENCOUNTER — Other Ambulatory Visit: Payer: Self-pay | Admitting: Cardiovascular Disease

## 2022-07-12 ENCOUNTER — Other Ambulatory Visit: Payer: Self-pay | Admitting: Urology

## 2022-07-12 ENCOUNTER — Telehealth: Payer: Self-pay | Admitting: Cardiovascular Disease

## 2022-07-12 NOTE — Telephone Encounter (Signed)
   Pre-operative Risk Assessment    Patient Name: [name] [name]  DOB: 01-Aug-1965 MRN: [id number]      Request for Surgical Clearance    Procedure:   Left Ureteroscopy Laser Lithotripsy and Left Ureteral Exchange  Date of Surgery:  Clearance 07/27/22                                 Surgeon:  Dr. Emogene Morgan Group or Practice Name:  [facility] Phone number:  [phone] Ext. [place] Fax number:  [phone]   Type of Clearance Requested:   - Medical  - Pharmacy:  Hold Clopidogrel (Plavix) for 5 days prior   Type of Anesthesia:  General    Additional requests/questions:  Please fax a copy of medical clearance to the surgeon's office.  Romilda Garret   07/12/2022, 4:37 PM

## 2022-07-12 NOTE — Telephone Encounter (Signed)
   Patient Name: [name] [name]  DOB: 1965/10/09 MRN: [id number]  Primary Cardiologist: Sanda Klein, MD  Chart reviewed as part of pre-operative protocol coverage. Given past medical history and time since last visit, based on ACC/AHA guidelines, Izell Badie is at acceptable risk for the planned procedure without further cardiovascular testing.  Patient was cleared for this procedure at office visit on 05/25/2022.   Per Dr. Sallyanne Kuster, primary cardiologist, he may hold Plavix for 5 to 7 days prior to procedure.  Please resume Plavix as soon as possible postprocedure, the discretion of the surgeon.  I will route this recommendation as well as the note from last OV with Dr. Sallyanne Kuster to the requesting party via [place] fax function and remove from pre-op pool.  Please call with questions.  Lenna Sciara, NP 07/12/2022, 4:43 PM

## 2022-07-13 ENCOUNTER — Other Ambulatory Visit: Payer: Self-pay | Admitting: Urology

## 2022-07-16 NOTE — Progress Notes (Signed)
COVID Vaccine received:  []$  No [x]$  Yes Date of any COVID positive Test in last 90 days:  None   PCP - [name], MD Cardiologist - Sanda Klein, MD, ([place]) 05-25-2022) Diona Browner, NP Cardiac clearance note 07-12-2022 Endocrinology- Jacelyn Pi, MD   Chest x-ray - 05-08-2022   2v Epic EKG -  05-25-2022  epic Stress Test -  ECHO -  Cardiac Cath - 07-28-2021  Has DES,  epic  by Dr. Martinique  PCR screen: []$  Ordered & Completed                      []$   No Order but Needs PROFEND                      [x]$   N/A for this surgery  Surgery Plan:  [x]$  Ambulatory                            []$  Outpatient in bed                            []$  Admit  Anesthesia:    [x]$  General  []$  Spinal                           []$   Choice []$   MAC  Bowel Prep - [x]$  No  []$   Yes _____________  Pacemaker / ICD device [x]$  No []$  Yes        Device order form faxed [x]$  No    []$   Yes      Faxed to:  Spinal Cord Stimulator:[x]$  No []$  Yes      (Remind patient to bring remote DOS) Other Implants:   History of Sleep Apnea? []$  No [x]$  Yes   CPAP used?- []$  No [x]$  Yes    Does the patient monitor blood sugar? []$  No []$  Yes  [x]$  N/A  last A1c 5.2   Blood Thinner / Instructions: Plavix  Hold 5-7 days per Dr. Louis Meckel, Armando Reichert NP  Patient voiced understanding.   Aspirin Instructions: ASA 59m, Patient states he was told by Dr. HCarlton Adamoffice to continue ASA. He will verify this at his appt with them after his PST appt.   ERAS Protocol Ordered: [x]$  No  []$  Yes Patient is to be NPO after:  Midnight prior   Comments: Patient can't stand at all but can slide transfer to a bed. He is 6' tall and weighs 400 lbs. So I have tried to reserve a BariBed, but was told that [employer] no long reserves these beds for surgery patients.   Activity level: Patient can not climb a flight of stairs without difficulty, he does not walk.    Patient can perform ADLs without assistance.   Anesthesia review: Htn, OSA  (CPAP), CHF, CAD  Patient denies shortness of breath, fever, cough and chest pain at PAT appointment.  Patient verbalized understanding and agreement to the Pre-Surgical Instructions that were given to them at this PAT appointment. Patient was also educated of the need to review these PAT instructions again prior to his/her surgery.I reviewed the appropriate phone numbers to call if they have any and questions or concerns.

## 2022-07-16 NOTE — Patient Instructions (Addendum)
SURGICAL WAITING ROOM VISITATION Patients having surgery or a procedure may have no more than 2 support people in the waiting area - these visitors may rotate in the visitor waiting room.   Due to an increase in RSV and influenza rates and associated hospitalizations, children ages 57 and under may not visit patients in [place]. If the patient needs to stay at the hospital during part of their recovery, the visitor guidelines for inpatient rooms apply.  PRE-OP VISITATION  Pre-op nurse will coordinate an appropriate time for 1 support person to accompany the patient in pre-op.  This support person may not rotate.  This visitor will be contacted when the time is appropriate for the visitor to come back in the pre-op area.  Please refer to the [facility] website for the visitor guidelines for Inpatients (after your surgery is over and you are in a regular room).  You are not required to quarantine at this time prior to your surgery. However, you must do this: Hand Hygiene often Do NOT share personal items Notify your provider if you are in close contact with someone who has COVID or you develop fever 100.4 or greater, new onset of sneezing, cough, sore throat, shortness of breath or body aches.  If you test positive for Covid or have been in contact with anyone that has tested positive in the last 10 days please notify you surgeon.    Your procedure is scheduled on:  Thursday  July 27, 2022  Report to [facility] Main Entrance: [place] entrance where the [employer] is available.   Report to admitting at: 08:15    AM  +++++Call this number if you have any questions or problems the morning of surgery [phone]  DO NOT EAT OR DRINK ANYTHING AFTER MIDNIGHT THE NIGHT PRIOR TO YOUR SURGERY / PROCEDURE.   FOLLOW BOWEL PREP AND ANY ADDITIONAL PRE OP INSTRUCTIONS YOU RECEIVED FROM YOUR SURGEON'S OFFICE!!!   Oral Hygiene is also important to reduce your risk of  infection.        Remember - BRUSH YOUR TEETH THE MORNING OF SURGERY WITH YOUR REGULAR TOOTHPASTE  Do NOT smoke after Midnight the night before surgery.  Take ONLY these medicines the morning of surgery with A SIP OF WATER: Metoprolol, Levothyroxine, bupropion (Wellbutrin) ????   You may take Tramadol ??? if needed    If You have been diagnosed with Sleep Apnea - Bring CPAP mask and tubing day of surgery. We will provide you with a CPAP machine on the day of your surgery.                   You may not have any metal on your body including  jewelry, and body piercing  Do not wear  lotions, powders, cologne, or deodorant  Men may shave face and neck.  Contacts, Hearing Aids, dentures or bridgework may not be worn into surgery. DENTURES WILL BE REMOVED PRIOR TO SURGERY PLEASE DO NOT APPLY "Poly grip" OR ADHESIVES!!!  Patients discharged on the day of surgery will not be allowed to drive home.  Someone NEEDS to stay with you for the first 24 hours after anesthesia.  Do not bring your home medications to the hospital. The Pharmacy will dispense medications listed on your medication list to you during your admission in the Hospital.  Special Instructions: Bring a copy of your healthcare power of attorney and living will documents the day of surgery, if you wish to have  them scanned into your [place] Medical Records- EPIC  Please read over the following fact sheets you were given: IF YOU HAVE QUESTIONS ABOUT YOUR PRE-OP INSTRUCTIONS, PLEASE CALL [phone]  ([place])   [place] - Preparing for Surgery Before surgery, you can play an important role.  Because skin is not sterile, your skin needs to be as free of germs as possible.  You can reduce the number of germs on your skin by washing with CHG (chlorahexidine gluconate) soap before surgery.  CHG is an antiseptic cleaner which kills germs and bonds with the skin to continue killing germs even after washing. Please DO NOT use if you have  an allergy to CHG or antibacterial soaps.  If your skin becomes reddened/irritated stop using the CHG and inform your nurse when you arrive at Short Stay. Do not shave (including legs and underarms) for at least 48 hours prior to the first CHG shower.  You may shave your face/neck.  Please follow these instructions carefully:  1.  Shower with CHG Soap the night before surgery and the  morning of surgery.  2.  If you choose to wash your hair, wash your hair first as usual with your normal  shampoo.  3.  After you shampoo, rinse your hair and body thoroughly to remove the shampoo.                             4.  Use CHG as you would any other liquid soap.  You can apply chg directly to the skin and wash.  Gently with a scrungie or clean washcloth.  5.  Apply the CHG Soap to your body ONLY FROM THE NECK DOWN.   Do not use on face/ open                           Wound or open sores. Avoid contact with eyes, ears mouth and genitals (private parts).                       Wash face,  Genitals (private parts) with your normal soap.             6.  Wash thoroughly, paying special attention to the area where your  surgery  will be performed.  7.  Thoroughly rinse your body with warm water from the neck down.  8.  DO NOT shower/wash with your normal soap after using and rinsing off the CHG Soap.            9.  Pat yourself dry with a clean towel.            10.  Wear clean pajamas.            11.  Place clean sheets on your bed the night of your first shower and do not  sleep with pets.  ON THE DAY OF SURGERY : Do not apply any lotions/deodorants the morning of surgery.  Please wear clean clothes to the hospital/surgery center.    FAILURE TO FOLLOW THESE INSTRUCTIONS MAY RESULT IN THE CANCELLATION OF YOUR SURGERY  PATIENT SIGNATURE_________________________________  NURSE SIGNATURE__________________________________  ________________________________________________________________________

## 2022-07-18 ENCOUNTER — Encounter (HOSPITAL_COMMUNITY)
Admission: RE | Admit: 2022-07-18 | Discharge: 2022-07-18 | Disposition: A | Discharge status: Home / Self Care | Payer: Medicare HMO | Source: Ambulatory Visit | Attending: Urology | Admitting: Urology

## 2022-07-18 ENCOUNTER — Encounter (HOSPITAL_COMMUNITY): Payer: Self-pay

## 2022-07-18 ENCOUNTER — Other Ambulatory Visit: Payer: Self-pay

## 2022-07-18 VITALS — BP 130/82 | HR 73 | Temp 98.7°F | Resp 24 | Ht 72.0 in | Wt 398.0 lb

## 2022-07-18 DIAGNOSIS — I130 Hypertensive heart and chronic kidney disease with heart failure and stage 1 through stage 4 chronic kidney disease, or unspecified chronic kidney disease: Secondary | ICD-10-CM | POA: Diagnosis not present

## 2022-07-18 DIAGNOSIS — Z8614 Personal history of Methicillin resistant Staphylococcus aureus infection: Secondary | ICD-10-CM

## 2022-07-18 DIAGNOSIS — Z01812 Encounter for preprocedural laboratory examination: Secondary | ICD-10-CM | POA: Insufficient documentation

## 2022-07-18 DIAGNOSIS — I5032 Chronic diastolic (congestive) heart failure: Secondary | ICD-10-CM | POA: Diagnosis not present

## 2022-07-18 DIAGNOSIS — N200 Calculus of kidney: Secondary | ICD-10-CM | POA: Diagnosis not present

## 2022-07-18 DIAGNOSIS — Z7902 Long term (current) use of antithrombotics/antiplatelets: Secondary | ICD-10-CM | POA: Diagnosis not present

## 2022-07-18 DIAGNOSIS — I2510 Atherosclerotic heart disease of native coronary artery without angina pectoris: Secondary | ICD-10-CM | POA: Diagnosis not present

## 2022-07-18 DIAGNOSIS — N201 Calculus of ureter: Secondary | ICD-10-CM | POA: Insufficient documentation

## 2022-07-18 DIAGNOSIS — G4730 Sleep apnea, unspecified: Secondary | ICD-10-CM | POA: Diagnosis not present

## 2022-07-18 DIAGNOSIS — Z955 Presence of coronary angioplasty implant and graft: Secondary | ICD-10-CM | POA: Insufficient documentation

## 2022-07-18 DIAGNOSIS — N189 Chronic kidney disease, unspecified: Secondary | ICD-10-CM | POA: Diagnosis not present

## 2022-07-18 HISTORY — DX: Hypothyroidism, unspecified: E03.9

## 2022-07-18 HISTORY — DX: Chronic kidney disease, unspecified: N18.9

## 2022-07-18 LAB — CBC
HCT: 39.5 % (ref 39.0–52.0)
Hemoglobin: 12.9 g/dL — ABNORMAL LOW (ref 13.0–17.0)
MCH: 30.1 pg (ref 26.0–34.0)
MCHC: 32.7 g/dL (ref 30.0–36.0)
MCV: 92.1 fL (ref 80.0–100.0)
Platelets: 122 10*3/uL — ABNORMAL LOW (ref 150–400)
RBC: 4.29 MIL/uL (ref 4.22–5.81)
RDW: 14.3 % (ref 11.5–15.5)
WBC: 5.2 10*3/uL (ref 4.0–10.5)
nRBC: 0 % (ref 0.0–0.2)

## 2022-07-18 LAB — BASIC METABOLIC PANEL
Anion gap: 8 (ref 5–15)
BUN: 19 mg/dL (ref 6–20)
CO2: 23 mmol/L (ref 22–32)
Calcium: 9 mg/dL (ref 8.9–10.3)
Chloride: 108 mmol/L (ref 98–111)
Creatinine, Ser: 1.26 mg/dL — ABNORMAL HIGH (ref 0.61–1.24)
GFR, Estimated: >60 mL/min (ref >60)
Glucose, Bld: 95 mg/dL (ref 70–99)
Potassium: 3.8 mmol/L (ref 3.5–5.1)
Sodium: 139 mmol/L (ref 135–145)

## 2022-07-18 LAB — SURGICAL PCR SCREEN
MRSA, PCR: NEGATIVE
Staphylococcus aureus: POSITIVE — AB

## 2022-07-18 NOTE — Progress Notes (Signed)
Patient's PCR screen is positive for STAPH. Appropriate notes have been placed on the patient's chart. This note has been routed to Dr. Louis Meckel for review. The Patient's surgery is currently scheduled for: 07-27-2022 at [facility].  [name] [name], BSN, CVRN-BC   Pre-Surgical Testing Nurse [place]  [phone]

## 2022-07-19 DIAGNOSIS — N200 Calculus of kidney: Secondary | ICD-10-CM | POA: Diagnosis not present

## 2022-07-19 NOTE — Anesthesia Preprocedure Evaluation (Addendum)
Anesthesia Evaluation  Patient identified by MRN, date of birth, ID band Patient awake    Reviewed: Allergy & Precautions, NPO status , Patient's Chart, lab work & pertinent test results  History of Anesthesia Complications (+) PONV and history of anesthetic complications  Airway Mallampati: II  TM Distance: >3 FB Neck ROM: Full    Dental no notable dental hx. (+) Teeth Intact, Dental Advisory Given   Pulmonary sleep apnea and Continuous Positive Airway Pressure Ventilation    Pulmonary exam normal breath sounds clear to auscultation       Cardiovascular hypertension, + CAD and + Cardiac Stents (07/2021)  Normal cardiovascular exam Rhythm:Regular Rate:Normal     Neuro/Psych   Anxiety Depression     Neuromuscular disease    GI/Hepatic   Endo/Other  Hypothyroidism  Morbid obesity  Renal/GU Renal InsufficiencyRenal diseaseLab Results      Component                Value               Date                      CREATININE               1.26 (H)            07/18/2022                BUN                      19                  07/18/2022                NA                       139                 07/18/2022                K                        3.8                 07/18/2022                CL                       108                 07/18/2022                CO2                      23                  07/18/2022                Musculoskeletal  (+) Arthritis ,    Abdominal   Peds  Hematology Lab Results      Component                Value               Date  WBC                      5.2                 07/18/2022                HGB                      12.9 (L)            07/18/2022                HCT                      39.5                07/18/2022                        PLT                      122 (L)             07/18/2022              Anesthesia Other Findings All: PCN   Reproductive/Obstetrics                             Anesthesia Physical Anesthesia Plan  ASA: 4  Anesthesia Plan: General   Post-op Pain Management:    Induction: Intravenous  PONV Risk Score and Plan: Treatment may vary due to age or medical condition and Ondansetron  Airway Management Planned: LMA  Additional Equipment:   Intra-op Plan:   Post-operative Plan: Extubation in OR  Informed Consent: I have reviewed the patients History and Physical, chart, labs and discussed the procedure including the risks, benefits and alternatives for the proposed anesthesia with the patient or authorized representative who has indicated his/her understanding and acceptance.     Dental advisory given  Plan Discussed with:   Anesthesia Plan Comments: (See PAT note 07/18/2022)       Anesthesia Quick Evaluation

## 2022-07-19 NOTE — Progress Notes (Signed)
Anesthesia Chart Review   Case: [id number] Date/Time: 07/27/22 1015   Procedure: LEFT URETEROSCOPY/HOLMIUM LASER/LEFT URETERAL STENT EXCHANGE (Left) - 75 MINUTES NEEDED FOR CASE   Anesthesia type: General   Pre-op diagnosis: LEFT URETERAL STONE   Location: [facility]   Surgeons: Ardis Hughs, MD       DISCUSSION:57 y.o. never smoker with h/o PONV, sleep apnea, diastolic CHF, CAD (PCI-DES proximal LAD 07/28/2021), CKD, HTN, left ureteral stone scheduled for above procedure 07/27/22 with Dr. Louis Meckel.   Per cardiology preoperative evaluation 07/12/2022, "Chart reviewed as part of pre-operative protocol coverage. Given past medical history and time since last visit, based on ACC/AHA guidelines, [name] [name] is at acceptable risk for the planned procedure without further cardiovascular testing.  Patient was cleared for this procedure at office visit on 05/25/2022.    Per Dr. Sallyanne Kuster, primary cardiologist, he may hold Plavix for 5 to 7 days prior to procedure.  Please resume Plavix as soon as possible postprocedure, the discretion of the surgeon."  Anticipate pt can proceed with planned procedure barring acute status change.   VS: BP 130/82 Comment: right arm sitting  Pulse 73   Temp 37.1 C (Oral)   Resp (!) 24   Ht 6' (1.829 m)   Wt (!) 180.5 kg   SpO2 98%   BMI 53.98 kg/m   PROVIDERS: [name], MD is PCP   Cardiologist - Sanda Klein, MD  LABS: Labs reviewed: Acceptable for surgery. (all labs ordered are listed, but only abnormal results are displayed)  Labs Reviewed  SURGICAL PCR SCREEN - Abnormal; Notable for the following components:      Result Value   Staphylococcus aureus POSITIVE (*)    All other components within normal limits  BASIC METABOLIC PANEL - Abnormal; Notable for the following components:   Creatinine, Ser 1.26 (*)    All other components within normal limits  CBC - Abnormal; Notable for the following components:    Hemoglobin 12.9 (*)    Platelets 122 (*)    All other components within normal limits     IMAGES:   EKG:   CV: Echo 12/19/2013 Study Conclusions   - Left ventricle: The cavity size was normal. Wall thickness was    increased in a pattern of mild LVH. Systolic function was mildly    reduced. The estimated ejection fraction was in the range of 45%    to 50%. There is hypokinesis of the inferior myocardium.  - Mitral valve: There was mild regurgitation.  - Right ventricle: The cavity size was moderately dilated. Wall    thickness was normal. Systolic function was mildly reduced.  Past Medical History:  Diagnosis Date   Allergic rhinitis    Anxiety    Arthritis    Chronic kidney disease    Depression    Diastolic CHF ([place])    Gout    H/O acute respiratory failure    History of kidney stones    Hypertension    Hypothyroidism    Morbid obesity ([place])    Peripheral neuropathy    Pituitary tumor    PONV (postoperative nausea and vomiting)    Sleep apnea    uses bi-pap    Past Surgical History:  Procedure Laterality Date   ABDOMINAL SURGERY  06/2013   for cyst with MRSA   ACHILLES TENDON REPAIR Bilateral    APPLICATION OF A-CELL OF CHEST/ABDOMEN N/A 01/26/2014   Procedure: PLACEMENT OF A CELL AND VAC ;  Surgeon: [name], DO;  Location: [place];  Service: Plastics;  Laterality: N/A;   APPLICATION OF A-CELL OF CHEST/ABDOMEN N/A 02/02/2014   Procedure: WITH PLACEMENT OF A CELL AND VAC ;  Surgeon: [name], DO;  Location: [place];  Service: Plastics;  Laterality: N/A;   BRAIN SURGERY     COLON RESECTION     CORONARY STENT INTERVENTION N/A 07/28/2021   Procedure: CORONARY STENT INTERVENTION;  Surgeon: Martinique, Peter M, MD;  Location: [place] CV LAB;  Service: Cardiovascular;  Laterality: N/A;   CRANIOTOMY N/A 12/10/2013   Procedure: Transpheniodal resection of Pituitary tumor with Dr. Radene Journey for approach;  Surgeon: Floyce Stakes, MD;  Location: [facility] NEURO ORS;   Service: Neurosurgery;  Laterality: N/A;  Transpheniodal resection of Pituitary tumor with Dr. Radene Journey for approach   CYSTOSCOPY WITH RETROGRADE PYELOGRAM, URETEROSCOPY AND STENT PLACEMENT Left 05/09/2022   Procedure: [place] AND STENT PLACEMENT;  Surgeon: Ardis Hughs, MD;  [facility];  Service: Urology;  Laterality: Left;   HERNIA REPAIR     INCISION AND DRAINAGE OF WOUND N/A 01/26/2014   Procedure: IRRIGATION AND DEBRIDEMENT ABDOMINAL WOUND WITH ;  Surgeon: [name], DO;  Location: [place];  Service: Plastics;  Laterality: N/A;   INCISION AND DRAINAGE OF WOUND N/A 02/02/2014   Procedure: IRRIGATION AND DEBRIDEMENT ABDOMINAL WOUND;  Surgeon: [name], DO;  Location: [place];  Service: Plastics;  Laterality: N/A;   INCISION AND DRAINAGE OF WOUND N/A 02/12/2014   Procedure: IRRIGATION AND DEBRIDEMENT OF ABDOMINAL WOUND/PLACEMENT OF A-CELL AND VAC;  Surgeon: [name], DO;  Location: [place];  Service: Plastics;  Laterality: N/A;   INCISION AND DRAINAGE OF WOUND N/A 04/01/2014   Procedure: IRRIGATION AND DEBRIDEMENT ABDOMINAL WOUND WITH PLACEMENT OF ACELL/VAC;  Surgeon: [name], DO;  Location: [place];  Service: Plastics;  Laterality: N/A;   LEFT HEART CATH AND CORONARY ANGIOGRAPHY N/A 07/28/2021   Procedure: LEFT HEART CATH AND CORONARY ANGIOGRAPHY;  Surgeon: Martinique, Peter M, MD;  Location: [place] CV LAB;  Service: Cardiovascular;  Laterality: N/A;   Sigmoid Colonoscopy     TRANSNASAL APPROACH N/A 12/10/2013   Procedure: TRANSNASAL APPROACH;  Surgeon: Rozetta Nunnery, MD;  Location: [facility];  Service: ENT;  Laterality: N/A;    MEDICATIONS:  acetaminophen (TYLENOL) 325 MG tablet   allopurinol (ZYLOPRIM) 300 MG tablet   ALPRAZolam (XANAX) 0.25 MG tablet   aspirin 81 MG chewable tablet   atorvastatin (LIPITOR) 20 MG tablet   buPROPion (WELLBUTRIN XL) 300 MG 24 hr tablet   Calcium Carbonate Antacid (TUMS PO)    carboxymethylcellulose (REFRESH PLUS) 0.5 % SOLN   clopidogrel (PLAVIX) 75 MG tablet   cyclobenzaprine (FLEXERIL) 10 MG tablet   diclofenac sodium (VOLTAREN) 1 % GEL   Diclofenac-miSOPROStol 75-0.2 MG TBEC   fluticasone (FLONASE) 50 MCG/ACT nasal spray   hydrocortisone (CORTEF) 20 MG tablet   isosorbide mononitrate (IMDUR) 30 MG 24 hr tablet   levothyroxine (SYNTHROID) 125 MCG tablet   metoprolol succinate (TOPROL-XL) 25 MG 24 hr tablet   pantoprazole (PROTONIX) 40 MG tablet   pseudoephedrine (SUDAFED) 120 MG 12 hr tablet   rOPINIRole (REQUIP) 0.5 MG tablet   Semaglutide, 1 MG/DOSE, (OZEMPIC, 1 MG/DOSE,) 4 MG/3ML SOPN   sertraline (ZOLOFT) 100 MG tablet   tamsulosin (FLOMAX) 0.4 MG CAPS capsule   traMADol (ULTRAM) 50 MG tablet   zolpidem (AMBIEN) 10 MG tablet   No current facility-administered medications for this  encounter.    [name] Felix Ward, PA-C [facility] Pre-Surgical Testing [phone]

## 2022-07-27 ENCOUNTER — Encounter (HOSPITAL_COMMUNITY): Payer: Self-pay | Admitting: Urology

## 2022-07-27 ENCOUNTER — Encounter (HOSPITAL_COMMUNITY)
Admission: RE | Disposition: A | Discharge status: Home / Self Care | Payer: Self-pay | Source: Home / Self Care | Attending: Urology

## 2022-07-27 ENCOUNTER — Ambulatory Visit (HOSPITAL_COMMUNITY): Payer: Medicare HMO | Admitting: Physician Assistant

## 2022-07-27 ENCOUNTER — Other Ambulatory Visit: Payer: Self-pay

## 2022-07-27 ENCOUNTER — Ambulatory Visit (HOSPITAL_BASED_OUTPATIENT_CLINIC_OR_DEPARTMENT_OTHER): Payer: Medicare HMO | Admitting: Anesthesiology

## 2022-07-27 ENCOUNTER — Ambulatory Visit (HOSPITAL_COMMUNITY): Payer: Medicare HMO

## 2022-07-27 ENCOUNTER — Ambulatory Visit (HOSPITAL_COMMUNITY)
Admission: RE | Admit: 2022-07-27 | Discharge: 2022-07-27 | Disposition: A | Discharge status: Home / Self Care | Payer: Medicare HMO | Attending: Urology | Admitting: Urology

## 2022-07-27 DIAGNOSIS — I2510 Atherosclerotic heart disease of native coronary artery without angina pectoris: Secondary | ICD-10-CM | POA: Diagnosis not present

## 2022-07-27 DIAGNOSIS — Z955 Presence of coronary angioplasty implant and graft: Secondary | ICD-10-CM | POA: Insufficient documentation

## 2022-07-27 DIAGNOSIS — N201 Calculus of ureter: Secondary | ICD-10-CM | POA: Diagnosis not present

## 2022-07-27 DIAGNOSIS — Z6841 Body Mass Index (BMI) 40.0 and over, adult: Secondary | ICD-10-CM | POA: Diagnosis not present

## 2022-07-27 DIAGNOSIS — G709 Myoneural disorder, unspecified: Secondary | ICD-10-CM | POA: Diagnosis not present

## 2022-07-27 DIAGNOSIS — I10 Essential (primary) hypertension: Secondary | ICD-10-CM | POA: Diagnosis not present

## 2022-07-27 DIAGNOSIS — I5032 Chronic diastolic (congestive) heart failure: Secondary | ICD-10-CM | POA: Diagnosis not present

## 2022-07-27 DIAGNOSIS — I110 Hypertensive heart disease with heart failure: Secondary | ICD-10-CM | POA: Insufficient documentation

## 2022-07-27 DIAGNOSIS — G4733 Obstructive sleep apnea (adult) (pediatric): Secondary | ICD-10-CM

## 2022-07-27 DIAGNOSIS — E039 Hypothyroidism, unspecified: Secondary | ICD-10-CM | POA: Diagnosis not present

## 2022-07-27 DIAGNOSIS — Z9989 Dependence on other enabling machines and devices: Secondary | ICD-10-CM | POA: Diagnosis not present

## 2022-07-27 DIAGNOSIS — N200 Calculus of kidney: Secondary | ICD-10-CM

## 2022-07-27 DIAGNOSIS — F418 Other specified anxiety disorders: Secondary | ICD-10-CM | POA: Diagnosis not present

## 2022-07-27 HISTORY — PX: CYSTOSCOPY/URETEROSCOPY/HOLMIUM LASER/STENT PLACEMENT: SHX6546

## 2022-07-27 SURGERY — CYSTOSCOPY/URETEROSCOPY/HOLMIUM LASER/STENT PLACEMENT
Anesthesia: General | Laterality: Right

## 2022-07-27 MED ORDER — PHENAZOPYRIDINE HCL 200 MG PO TABS: 200.0000 mg | ORAL_TABLET | Freq: Three times a day (TID) | ORAL | 0 refills | Status: DC | PRN

## 2022-07-27 MED ORDER — PROPOFOL 10 MG/ML IV BOLUS
INTRAVENOUS | Status: DC | PRN
  Administered 2022-07-27: 50 mg via INTRAVENOUS
  Administered 2022-07-27: 40 mg via INTRAVENOUS
  Administered 2022-07-27: 200 mg via INTRAVENOUS

## 2022-07-27 MED ORDER — PROPOFOL 10 MG/ML IV BOLUS
INTRAVENOUS | Status: AC
  Filled 2022-07-27: qty 20

## 2022-07-27 MED ORDER — CHLORHEXIDINE GLUCONATE 0.12 % MT SOLN
15.0000 mL | Freq: Once | OROMUCOSAL | Status: AC
  Administered 2022-07-27: 15 mL via OROMUCOSAL

## 2022-07-27 MED ORDER — IOHEXOL 300 MG/ML  SOLN
INTRAMUSCULAR | Status: DC | PRN
  Administered 2022-07-27: 30 mL

## 2022-07-27 MED ORDER — ORAL CARE MOUTH RINSE: 15.0000 mL | Freq: Once | OROMUCOSAL | Status: AC

## 2022-07-27 MED ORDER — ONDANSETRON HCL 4 MG/2ML IJ SOLN
INTRAMUSCULAR | Status: AC
  Filled 2022-07-27: qty 2

## 2022-07-27 MED ORDER — OXYCODONE HCL 5 MG PO TABS: 5.0000 mg | ORAL_TABLET | Freq: Once | ORAL | Status: DC | PRN

## 2022-07-27 MED ORDER — PHENYLEPHRINE HCL-NACL 20-0.9 MG/250ML-% IV SOLN
INTRAVENOUS | Status: DC | PRN
  Administered 2022-07-27: 35 ug/min via INTRAVENOUS

## 2022-07-27 MED ORDER — FENTANYL CITRATE (PF) 100 MCG/2ML IJ SOLN
INTRAMUSCULAR | Status: DC | PRN
  Administered 2022-07-27: 75 ug via INTRAVENOUS

## 2022-07-27 MED ORDER — OXYCODONE HCL 5 MG PO TABS: 5.0000 mg | ORAL_TABLET | Freq: Four times a day (QID) | ORAL | 0 refills | Status: AC | PRN

## 2022-07-27 MED ORDER — EPHEDRINE SULFATE-NACL 50-0.9 MG/10ML-% IV SOSY
PREFILLED_SYRINGE | INTRAVENOUS | Status: DC | PRN
  Administered 2022-07-27 (×5): 5 mg via INTRAVENOUS

## 2022-07-27 MED ORDER — CIPROFLOXACIN IN D5W 400 MG/200ML IV SOLN
400.0000 mg | INTRAVENOUS | Status: AC
  Administered 2022-07-27: 400 mg via INTRAVENOUS

## 2022-07-27 MED ORDER — LACTATED RINGERS IV SOLN: INTRAVENOUS | Status: DC

## 2022-07-27 MED ORDER — OXYCODONE HCL 5 MG/5ML PO SOLN: 5.0000 mg | Freq: Once | ORAL | Status: DC | PRN

## 2022-07-27 MED ORDER — LIDOCAINE 2% (20 MG/ML) 5 ML SYRINGE
INTRAMUSCULAR | Status: DC | PRN
  Administered 2022-07-27: 100 mg via INTRAVENOUS

## 2022-07-27 MED ORDER — MIDAZOLAM HCL 2 MG/2ML IJ SOLN
INTRAMUSCULAR | Status: AC
  Filled 2022-07-27: qty 2

## 2022-07-27 MED ORDER — AMISULPRIDE (ANTIEMETIC) 5 MG/2ML IV SOLN: 10.0000 mg | Freq: Once | INTRAVENOUS | Status: DC | PRN

## 2022-07-27 MED ORDER — CIPROFLOXACIN HCL 500 MG PO TABS: 500.0000 mg | ORAL_TABLET | Freq: Once | ORAL | 0 refills | Status: AC

## 2022-07-27 MED ORDER — DEXAMETHASONE SODIUM PHOSPHATE 10 MG/ML IJ SOLN
INTRAMUSCULAR | Status: DC | PRN
  Administered 2022-07-27: 10 mg via INTRAVENOUS

## 2022-07-27 MED ORDER — DEXMEDETOMIDINE HCL IN NACL 80 MCG/20ML IV SOLN
INTRAVENOUS | Status: DC | PRN
  Administered 2022-07-27: 16 ug via BUCCAL

## 2022-07-27 MED ORDER — EPHEDRINE 5 MG/ML INJ
INTRAVENOUS | Status: AC
  Filled 2022-07-27: qty 5

## 2022-07-27 MED ORDER — PHENYLEPHRINE 80 MCG/ML (10ML) SYRINGE FOR IV PUSH (FOR BLOOD PRESSURE SUPPORT)
PREFILLED_SYRINGE | INTRAVENOUS | Status: DC | PRN
  Administered 2022-07-27 (×2): 80 ug via INTRAVENOUS

## 2022-07-27 MED ORDER — DEXAMETHASONE SODIUM PHOSPHATE 10 MG/ML IJ SOLN
INTRAMUSCULAR | Status: AC
  Filled 2022-07-27: qty 1

## 2022-07-27 MED ORDER — ONDANSETRON HCL 4 MG/2ML IJ SOLN: 4.0000 mg | Freq: Once | INTRAMUSCULAR | Status: DC | PRN

## 2022-07-27 MED ORDER — FENTANYL CITRATE (PF) 100 MCG/2ML IJ SOLN
INTRAMUSCULAR | Status: AC
  Filled 2022-07-27: qty 2

## 2022-07-27 MED ORDER — ACETAMINOPHEN 10 MG/ML IV SOLN: 1000.0000 mg | Freq: Once | INTRAVENOUS | Status: DC | PRN

## 2022-07-27 MED ORDER — MIDAZOLAM HCL 5 MG/5ML IJ SOLN
INTRAMUSCULAR | Status: DC | PRN
  Administered 2022-07-27: 2 mg via INTRAVENOUS

## 2022-07-27 MED ORDER — CIPROFLOXACIN IN D5W 400 MG/200ML IV SOLN
INTRAVENOUS | Status: AC
  Filled 2022-07-27: qty 200

## 2022-07-27 MED ORDER — PHENYLEPHRINE 80 MCG/ML (10ML) SYRINGE FOR IV PUSH (FOR BLOOD PRESSURE SUPPORT)
PREFILLED_SYRINGE | INTRAVENOUS | Status: AC
  Filled 2022-07-27: qty 10

## 2022-07-27 MED ORDER — SODIUM CHLORIDE 0.9 % IR SOLN
Status: DC | PRN
  Administered 2022-07-27: 3000 mL via INTRAVESICAL

## 2022-07-27 MED ORDER — HYDROMORPHONE HCL 1 MG/ML IJ SOLN: 0.2500 mg | INTRAMUSCULAR | Status: DC | PRN

## 2022-07-27 MED ORDER — 0.9 % SODIUM CHLORIDE (POUR BTL) OPTIME
TOPICAL | Status: DC | PRN
  Administered 2022-07-27: 1000 mL

## 2022-07-27 MED ORDER — DEXMEDETOMIDINE HCL IN NACL 80 MCG/20ML IV SOLN
INTRAVENOUS | Status: AC
  Filled 2022-07-27: qty 20

## 2022-07-27 MED ORDER — LIDOCAINE HCL (PF) 2 % IJ SOLN
INTRAMUSCULAR | Status: AC
  Filled 2022-07-27: qty 5

## 2022-07-27 MED ORDER — ONDANSETRON HCL 4 MG/2ML IJ SOLN
INTRAMUSCULAR | Status: DC | PRN
  Administered 2022-07-27: 4 mg via INTRAVENOUS

## 2022-07-27 SURGICAL SUPPLY — 25 items
BAG URO CATCHER STRL LF (MISCELLANEOUS) ×1 IMPLANT
BASKET ZERO TIP NITINOL 2.4FR (BASKET) IMPLANT
BENZOIN TINCTURE PRP APPL 2/3 (GAUZE/BANDAGES/DRESSINGS) IMPLANT
CATH URETERAL DUAL LUMEN 10F (MISCELLANEOUS) IMPLANT
CATH URETL OPEN 5X70 (CATHETERS) ×1 IMPLANT
CLOTH BEACON ORANGE TIMEOUT ST (SAFETY) ×1 IMPLANT
DRSG TEGADERM 2-3/8X2-3/4 SM (GAUZE/BANDAGES/DRESSINGS) IMPLANT
EXTRACTOR STONE 1.7FRX115CM (UROLOGICAL SUPPLIES) IMPLANT
GLOVE SURG LX STRL 7.5 STRW (GLOVE) ×1 IMPLANT
GOWN STRL REUS W/ TWL XL LVL3 (GOWN DISPOSABLE) ×1 IMPLANT
GOWN STRL REUS W/TWL XL LVL3 (GOWN DISPOSABLE) ×2
GUIDEWIRE ANG ZIPWIRE 038X150 (WIRE) IMPLANT
GUIDEWIRE STR DUAL SENSOR (WIRE) ×1 IMPLANT
KIT TURNOVER KIT A (KITS) IMPLANT
LASER FIB FLEXIVA PULSE ID 365 (Laser) IMPLANT
MANIFOLD NEPTUNE II (INSTRUMENTS) ×1 IMPLANT
MAT PREVALON FULL STRYKER (MISCELLANEOUS) IMPLANT
PACK CYSTO (CUSTOM PROCEDURE TRAY) ×1 IMPLANT
SHEATH NAVIGATOR HD 12/14X28 (SHEATH) IMPLANT
SHEATH NAVIGATOR HD 12/14X36 (SHEATH) IMPLANT
STENT URET 6FRX26 CONTOUR (STENTS) IMPLANT
TRACTIP FLEXIVA PULS ID 200XHI (Laser) IMPLANT
TRACTIP FLEXIVA PULSE ID 200 (Laser) IMPLANT
TUBING CONNECTING 10 (TUBING) ×1 IMPLANT
TUBING UROLOGY SET (TUBING) ×1 IMPLANT

## 2022-07-27 NOTE — Interval H&P Note (Signed)
History and Physical Interval Note:  07/27/2022 9:36 AM  [name] [name]  has presented today for surgery, with the diagnosis of LEFT URETERAL STONE.  The various methods of treatment have been discussed with the patient and family. After consideration of risks, benefits and other options for treatment, the patient has consented to  Procedure(s) with comments: LEFT URETEROSCOPY/HOLMIUM LASER/LEFT URETERAL STENT EXCHANGE (Left) - 75 MINUTES NEEDED FOR CASE as a surgical intervention.  The patient's history has been reviewed, patient examined, no change in status, stable for surgery.  I have reviewed the patient's chart and labs.  Questions were answered to the patient's satisfaction.     Ardis Hughs

## 2022-07-27 NOTE — Transfer of Care (Signed)
Immediate Anesthesia Transfer of Care Note  Patient: [name] [name]  Procedure(s) Performed: LEFT URETEROSCOPY/HOLMIUM LASER/LEFT URETERAL STENT EXCHANGE (Left)  Patient Location: PACU  Anesthesia Type:General  Level of Consciousness: awake, alert , and oriented  Airway & Oxygen Therapy: Patient Spontanous Breathing and Patient connected to face mask oxygen  Post-op Assessment: Report given to RN and Post -op Vital signs reviewed and stable  Post vital signs: Reviewed and stable  Last Vitals:  Vitals Value Taken Time  BP 146/88 07/27/22 1125  Temp    Pulse 65 07/27/22 1127  Resp 15 07/27/22 1127  SpO2 96 % 07/27/22 1127  Vitals shown include unvalidated device data.  Last Pain:  Vitals:   07/27/22 0855  TempSrc: Oral  PainSc:          Complications: No notable events documented.

## 2022-07-27 NOTE — H&P (Signed)
Expand All Collapse All  I have been asked to see the patient by Dr. Georgina Snell, for evaluation and management of left large UPJ stone and UTI.   History of present illness: 57 year old male with extensive comorbidities presented to the emergency department with left sided flank pain and fevers of 101.8 at home.  Urine analysis demonstrated concern for infection.  CT scan demonstrated a 13 mm left UPJ stone with proximal hydronephrosis.  The patient fortunately has been hemodynamically stable.  Urology was consulted for further management.   Review of systems: A 12 point comprehensive review of systems was obtained and is negative unless otherwise stated in the history of present illness.    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  Gout 12/15/2013  Clinical depression 12/15/2013  Chronic respiratory failure with hypercapnia ([place]) 12/07/2013  Morbid obesity ([place]) 12/07/2013  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[place]) 12/06/2013  Pituitary adenoma ([place]) 12/06/2013  Intrinsic asthma 10/06/2013  OSA (obstructive sleep apnea) 10/06/2013  Injury of kidney 08/07/2013  Acute on chronic diastolic heart failure ([place]) 08/07/2013  Disorder of peripheral nervous system 08/07/2013  Generalized OA 08/07/2013  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    Current Outpatient Medications on File Prior to Encounter Medication Sig Dispense Refill  allopurinol (ZYLOPRIM) 300 MG tablet Take 300 mg by mouth daily.      ALPRAZolam (XANAX) 0.25 MG tablet Take 1 tablet (0.25 mg total) by mouth 3 (three) times daily as needed for anxiety or sleep (and QHS prn insomnia). 60 tablet 0  aspirin 81 MG chewable tablet Chew 81 mg by mouth daily.      buPROPion (WELLBUTRIN XL) 300 MG 24 hr tablet Take 300 mg by mouth daily.      carboxymethylcellulose (REFRESH PLUS) 0.5 % SOLN Place 2 drops into both eyes daily as needed (for dry eyes).      clopidogrel (PLAVIX) 75 MG tablet Take 1 tablet (75 mg total) by mouth daily with breakfast. 90 tablet 3  cyclobenzaprine (FLEXERIL) 10 MG tablet Take 10 mg by mouth 3 (three) times daily as needed for muscle spasms.      diclofenac sodium (VOLTAREN) 1 % GEL Apply 4 g topically daily as needed (pain).      Diclofenac-miSOPROStol 75-0.2 MG TBEC Take 1 tablet by mouth 2 (two) times daily.       hydrocortisone (CORTEF) 20 MG tablet Take 20 mg by mouth daily.      isosorbide mononitrate (IMDUR) 30 MG 24 hr tablet Take 1 tablet (30 mg total) by mouth daily. 90 tablet 3  levothyroxine (SYNTHROID) 125 MCG tablet Take 125 mcg by mouth daily before breakfast.      metoprolol tartrate (LOPRESSOR) 25 MG tablet Take 0.5 tablets (12.5 mg total) by mouth 2 (two) times daily.  30 tablet 6  modafinil (PROVIGIL) 100 MG tablet Take 100 mg by mouth daily as needed (take for drowsiness).      pantoprazole (PROTONIX) 40 MG tablet Take 40 mg by mouth daily.      pseudoephedrine (SUDAFED) 120 MG 12 hr tablet Take 120 mg by mouth every 12 (twelve) hours as needed for congestion.      rOPINIRole (REQUIP) 0.5 MG tablet Take 0.5 mg by mouth at bedtime as  needed (RLS).      rosuvastatin (CRESTOR) 20 MG tablet TAKE 1 TABLET DAILY 90 tablet 3  Semaglutide, 1 MG/DOSE, (OZEMPIC, 1 MG/DOSE,) 4 MG/3ML SOPN Inject 1 mg into the skin every Sunday.      sertraline (ZOLOFT) 100 MG tablet Take 150 mg by mouth daily.      traMADol (ULTRAM) 50 MG tablet Take 50-100 mg by mouth at bedtime as needed for moderate pain.      zolpidem (AMBIEN) 10 MG tablet Take 1 tablet (10 mg total) by mouth at bedtime as needed for sleep. (Patient taking differently: Take 10 mg by mouth at bedtime.) 30 tablet 5      Past Medical History: Diagnosis Date  Allergic rhinitis    Anxiety    Arthritis    Depression    Diastolic CHF ([place])    Gout    H/O acute respiratory failure    Hypertension    Morbid obesity ([place])    Nephrolithiasis    Peripheral neuropathy    Pituitary tumor    PONV (postoperative nausea and vomiting)    Sleep apnea     uses bi-pap      Past Surgical History: Procedure Laterality Date  ABDOMINAL SURGERY   06/2013   for cyst with MRSA  ACHILLES TENDON REPAIR Bilateral    APPLICATION OF A-CELL OF CHEST/ABDOMEN N/A 01/26/2014   Procedure: PLACEMENT OF A CELL AND VAC ;  Surgeon: [name], DO;  Location: [place];  Service: Plastics;  Laterality: N/A;  APPLICATION OF A-CELL OF CHEST/ABDOMEN N/A 02/02/2014   Procedure: WITH PLACEMENT OF A CELL AND VAC ;  Surgeon: [name], DO;  Location: [place];  Service: Plastics;  Laterality: N/A;  BRAIN SURGERY      COLON RESECTION      CORONARY STENT INTERVENTION N/A 07/28/2021   Procedure: CORONARY STENT INTERVENTION;  Surgeon: Martinique, Peter M, MD;  Location: [place] CV LAB;  Service: Cardiovascular;  Laterality: N/A;  CRANIOTOMY N/A 12/10/2013   Procedure: Transpheniodal resection of Pituitary tumor with Dr. Radene Journey for approach;  Surgeon: Floyce Stakes, MD;  Location: [facility];  Service: Neurosurgery;  Laterality: N/A;  Transpheniodal resection of Pituitary tumor with Dr. Radene Journey for  approach  HERNIA REPAIR      INCISION AND DRAINAGE OF WOUND N/A 01/26/2014   Procedure: IRRIGATION AND DEBRIDEMENT ABDOMINAL WOUND WITH ;  Surgeon: [name], DO;  Location: [place];  Service: Plastics;  Laterality: N/A;  INCISION AND DRAINAGE OF WOUND N/A 02/02/2014   Procedure: IRRIGATION AND DEBRIDEMENT ABDOMINAL WOUND;  Surgeon: [name], DO;  Location: [place];  Service: Plastics;  Laterality: N/A;  INCISION AND DRAINAGE OF WOUND N/A 02/12/2014   Procedure: IRRIGATION AND DEBRIDEMENT OF ABDOMINAL WOUND/PLACEMENT OF A-CELL AND VAC;  Surgeon: [name], DO;  Location: [place];  Service: Plastics;  Laterality: N/A;  INCISION AND DRAINAGE OF WOUND N/A 04/01/2014   Procedure: IRRIGATION AND DEBRIDEMENT ABDOMINAL WOUND WITH PLACEMENT OF ACELL/VAC;  Surgeon: [name], DO;  Location:  [place] OR;  Service: Plastics;  Laterality: N/A;  LEFT HEART CATH AND CORONARY ANGIOGRAPHY N/A 07/28/2021   Procedure: LEFT HEART CATH AND CORONARY ANGIOGRAPHY;  Surgeon: Martinique, Peter M, MD;  Location: [place] CV LAB;  Service: Cardiovascular;  Laterality: N/A;  Sigmoid Colonoscopy      TRANSNASAL APPROACH N/A 12/10/2013   Procedure: TRANSNASAL APPROACH;  Surgeon: Rozetta Nunnery, MD;  Location: [place] NEURO ORS;  Service: ENT;  Laterality: N/A;      Social History    Tobacco Use  Smoking status: Never  Smokeless tobacco: Never Vaping Use  Vaping Use: Never used Substance Use Topics  Alcohol use: No  Drug use: No      Family History Problem Relation Age of Onset  Hypertension Mother    Hypertension Father    Gout Father    Hypothyroidism Father    Gout Brother    Cancer Maternal Aunt         colon  Cancer Maternal Grandmother         breast     PE:  Vitals:   05/09/22 2100 05/09/22 2227 05/09/22 2230 05/09/22 2251 BP:   124/86 118/76 114/72 Pulse: 77 70 72 72 Resp:   20   12 Temp:   97.7 F (36.5 C)   98.4 F (36.9 C) TempSrc:   Oral     SpO2: 100% 99% 98% 100% Weight:          Height:           Patient appears to be in mild to moderate distress, morbidly obese patient is alert and oriented x3 Atraumatic normocephalic head No cervical or supraclavicular lymphadenopathy appreciated No increased work of breathing, no audible wheezes/rhonchi Regular sinus rhythm/rate Abdomen is soft, nontender, nondistended, left-sided CVA tenderness Lower extremities are symmetric without appreciable edema Grossly neurologically intact No identifiable skin lesions    Recent Labs (last 2 labs) Recent Labs   05/08/22 1839 05/08/22 1858 05/09/22 1723 WBC 9.6  --  6.6 HGB 13.2 12.9* 13.5 HCT 37.2* 38.0* 39.6     Recent Labs (last 2 labs) Recent Labs   05/08/22 1839 05/08/22 1858 05/09/22 1723 NA 131* 133* 133* K 3.4* 3.5 3.8 CL 97* 97* 95* CO2 21*  --  21* GLUCOSE 100* 99 98 BUN 14 13 21* CREATININE 1.51* 1.30* 1.39* CALCIUM 8.2*  --  8.7*     Recent Labs (last 2 labs) No results for input(s): "LABPT", "INR" in the last 72 hours.   Recent Labs (last 2 labs) No results for input(s): "LABURIN" in the last 72 hours.   > Results for orders placed or performed during the hospital encounter of 04/01/14 Surgical pcr screen     Status: None   Collection Time: 04/01/14 11:35 AM   Specimen: Nasal Mucosa; Nasal Swab Result Value Ref Range Status   MRSA, PCR NEGATIVE NEGATIVE Final   Staphylococcus aureus NEGATIVE NEGATIVE Final     Comment:        The Xpert SA Assay (FDA approved for NASAL specimens in patients over 17 years of age), is one component of a comprehensive surveillance program.  Test performance has been validated by [employer] for patients greater than or equal to 65 year old. It is not intended to diagnose infection nor to guide or monitor treatment.      Imaging: I have a independently reviewed the patient's CT scan, which demonstrates a large left UPJ stone and bilateral nonobstructing stones.   Imp: The patient  has an  obstructing left proximal ureteral stone, UTI, and a host of other medical comorbidities.   Recommendations: Given the above, I have recommended that the patient proceed with a left ureteral stent urgently.  He will then be admitted to internal medicine for management of his comorbidities and infection, we will subsequently schedule him for follow-up for stone removal once he has been optimized and is fever has cleared.

## 2022-07-27 NOTE — Anesthesia Procedure Notes (Signed)
Procedure Name: LMA Insertion Date/Time: 07/27/2022 10:07 AM  Performed by: Maxwell Caul, CRNAPre-anesthesia Checklist: Patient identified, Emergency Drugs available, Suction available and Patient being monitored Patient Re-evaluated:Patient Re-evaluated prior to induction Oxygen Delivery Method: Circle system utilized Preoxygenation: Pre-oxygenation with 100% oxygen Induction Type: IV induction LMA: LMA with gastric port inserted LMA Size: 5.0 Number of attempts: 1 Placement Confirmation: positive ETCO2 and breath sounds checked- equal and bilateral Tube secured with: Tape Dental Injury: Teeth and Oropharynx as per pre-operative assessment

## 2022-07-27 NOTE — Op Note (Signed)
Preoperative diagnosis: right ureteral calculus  Postoperative diagnosis: right ureteral calculus  Procedure:  Cystoscopy right ureteroscopy and stone removal Ureteroscopic laser lithotripsy right 40F x 26 ureteral stent exchange right retrograde pyelography with interpretation  Surgeon: Ardis Hughs, MD  Anesthesia: General  Complications: None  Intraoperative findings: #1: The retrograde pyelogram demonstrated filling defect in the lower pole, the stone clearly been pushed up from the UPJ into the lower pole.  There is no hydroureteronephrosis or other abnormalities.  EBL: Minimal  Specimens: right ureteral calculus  Disposition of specimens: [facility] for stone analysis  Indication: [name] [name] is a 57 y.o.   patient with a history of an infected right UPJ stone.  I placed a stent in his right ureter approximately 6 weeks ago.  He has had some difficulty with upper respiratory tract infection and is initial surgery for treatment of the stone was postponed.  After reviewing the management options for treatment, the patient elected to proceed with the above surgical procedure(s). We have discussed the potential benefits and risks of the procedure, side effects of the proposed treatment, the likelihood of the patient achieving the goals of the procedure, and any potential problems that might occur during the procedure or recuperation. Informed consent has been obtained.   Description of procedure:  The patient was taken to the operating room and general anesthesia was induced.  The patient was placed in the dorsal lithotomy position, prepped and draped in the usual sterile fashion, and preoperative antibiotics were administered. A preoperative time-out was performed.   21 French 30 degree cystoscope was gently passed through the patient's urethra into the bladder under vision guidance.  The stent was grasped emanating from the patient's right ureteral  orifice and brought out the urethral meatus.  A wire was then advanced up through the stent up into the right renal pelvis removing stent over the wire.  I then advanced a dual-lumen catheter over the wire and into the distal ureter.  I instilled 10 cc of Omnipaque contrast into the dual-lumen catheter and performed retrograde pyelogram with the above findings.  I then advanced a second wire through the dual-lumen catheter and advanced it up to the renal pelvis.  I then removed the catheter over the wire and exchanged for medium sized ureteral access sheath.  I advanced a [id number] French access sheath up over the second wire and into the proximal ureter removing the inner portion and the wire together.  This was performed under fluoroscopic guidance.  I then advanced a flexible ureteroscope through the sheath and into the right renal pelvis.  I encountered the stone in the lower pole.  I grasped it with the basket and moved into the upper pole.  I then encountered 2 small stones in the lower pole that I was able to pull off of The parenchyma and remove those without fragmentation.  I then used a 200 m laser fiber and dusted the large 13 mm stone that I moved into the upper pole.  I did remove some of the larger fragments but for the most part the stone was dusted.  I triple checked to make sure all the small fragments have been completely dusted.  I then slowly removed the ureteroscope and the access sheath simultaneously inspecting the ureter on the way out noting no additional trauma.  The wire was then backloaded through the cystoscope and a ureteral stent was advance over the wire using Seldinger technique.  The stent was positioned appropriately under  fluoroscopic and cystoscopic guidance.  The wire was then removed with an adequate stent curl noted in the renal pelvis as well as in the bladder.  The bladder was then emptied and the procedure ended.  The patient appeared to tolerate the procedure well and  without complications.  The patient was able to be awakened and transferred to the recovery unit in satisfactory condition.   Disposition: The tether of the stent was left on and secured to the ventral aspect of the patient's penis. Instructions for removing the stent have been provided to the patient.  The patient has been scheduled for followup in 6 weeks with a renal ultrasound.

## 2022-07-27 NOTE — Discharge Instructions (Signed)
DISCHARGE INSTRUCTIONS FOR KIDNEY STONE/URETERAL STENT   MEDICATIONS:  1. Resume all your other meds from home - except do not take any extra narcotic pain meds that you may have at home.  2. Pyridium is to help with the burning/stinging when you urinate. 3. Oxycodone is for moderate/severe pain, otherwise taking upto 1000 mg every 6 hours of plainTylenol will help treat your pain.   4. Take Cipro one hour prior to removal of your stent.   ACTIVITY:  1. No strenuous activity x 1week  2. No driving while on narcotic pain medications  3. Drink plenty of water  4. Continue to walk at home - you can still get blood clots when you are at home, so keep active, but don't over do it.  5. May return to work/school tomorrow or when you feel ready   BATHING:  1. You can shower and we recommend daily showers  2. You have a string coming from your urethra: The stent string is attached to your ureteral stent. Do not pull on this.   SIGNS/SYMPTOMS TO CALL:  Please call us if you have a fever greater than 101.5, uncontrolled nausea/vomiting, uncontrolled pain, dizziness, unable to urinate, bloody urine, chest pain, shortness of breath, leg swelling, leg pain, redness around wound, drainage from wound, or any other concerns or questions.   You can reach [place] at [phone].   FOLLOW-UP:  1. You have an appointment in 6 weeks with a ultrasound of your kidneys prior.  2. You have a string attached to your stent, you may remove it on Monday, Feb 26th. To do this, pull the strings until the stents are completely removed. You may feel an odd sensation in your back.

## 2022-07-28 NOTE — Anesthesia Postprocedure Evaluation (Signed)
Anesthesia Post Note  Patient: [name] [name]  Procedure(s) Performed: CYSTOSCOPY RIGHT RETROGRADE /URETEROSCOPY/HOLMIUM LASER/RIGHT URETERAL STENT EXCHANGE (Right)     Patient location during evaluation: PACU Anesthesia Type: General Level of consciousness: awake and alert Pain management: pain level controlled Vital Signs Assessment: post-procedure vital signs reviewed and stable Respiratory status: spontaneous breathing, nonlabored ventilation, respiratory function stable and patient connected to nasal cannula oxygen Cardiovascular status: blood pressure returned to baseline and stable Postop Assessment: no apparent nausea or vomiting Anesthetic complications: no  No notable events documented.  Last Vitals:  Vitals:   07/27/22 1215 07/27/22 1230  BP: (!) 141/87 132/87  Pulse: 66 71  Resp: 16 13  Temp: (!) 36.4 C   SpO2: 93% 93%    Last Pain:  Vitals:   07/27/22 1230  TempSrc:   PainSc: 0-No pain   Pain Goal:                   Barnet Glasgow

## 2022-07-29 ENCOUNTER — Encounter (HOSPITAL_COMMUNITY): Payer: Self-pay | Admitting: Urology

## 2022-08-02 LAB — CALCULI, WITH PHOTOGRAPH (CLINICAL LAB)
Calcium Oxalate Dihydrate: 10 %
Calcium Oxalate Monohydrate: 90 %
Weight Calculi: 191 mg

## 2022-08-03 ENCOUNTER — Other Ambulatory Visit: Payer: Self-pay | Admitting: Cardiovascular Disease

## 2022-08-07 ENCOUNTER — Telehealth: Payer: Self-pay | Admitting: Cardiovascular Disease

## 2022-08-07 NOTE — Telephone Encounter (Signed)
Attempted to call patient, left message for patient to call back to office.   

## 2022-08-07 NOTE — Telephone Encounter (Signed)
Patient called to talk with Dr. Sallyanne Kuster or nurse. Please call back

## 2022-08-08 NOTE — Telephone Encounter (Signed)
Spoke to patient . Per  Dr Sallyanne Kuster  office note in  Dec 21,2024  patient instruction were :  Stop DAPT for the urology procedure, resume ASA only after that (>10 months from elective LAD PCI-stent).    RN informed patient . He dose not need Clopidogrel 75 mg  per office note. Patient is to continue  taking Aspirin 81 mg .  Patient voiced understanding. Patient states he did not pay attention to the instruction from the office note.

## 2022-08-08 NOTE — Telephone Encounter (Signed)
Pt is returning a call from yesterday, pt stated that they asked for a refill but the medication was denied by the physician and the pt is wondering why it was denied.

## 2022-08-18 DIAGNOSIS — E230 Hypopituitarism: Secondary | ICD-10-CM | POA: Diagnosis not present

## 2022-08-18 DIAGNOSIS — I10 Essential (primary) hypertension: Secondary | ICD-10-CM | POA: Diagnosis not present

## 2022-08-18 DIAGNOSIS — E038 Other specified hypothyroidism: Secondary | ICD-10-CM | POA: Diagnosis not present

## 2022-08-18 DIAGNOSIS — E2740 Unspecified adrenocortical insufficiency: Secondary | ICD-10-CM | POA: Diagnosis not present

## 2022-08-18 DIAGNOSIS — R7303 Prediabetes: Secondary | ICD-10-CM | POA: Diagnosis not present

## 2022-08-24 DIAGNOSIS — E230 Hypopituitarism: Secondary | ICD-10-CM | POA: Diagnosis not present

## 2022-08-24 DIAGNOSIS — E038 Other specified hypothyroidism: Secondary | ICD-10-CM | POA: Diagnosis not present

## 2022-08-24 DIAGNOSIS — R7303 Prediabetes: Secondary | ICD-10-CM | POA: Diagnosis not present

## 2022-08-24 DIAGNOSIS — I10 Essential (primary) hypertension: Secondary | ICD-10-CM | POA: Diagnosis not present

## 2022-08-24 DIAGNOSIS — E240 Pituitary-dependent Cushing's disease: Secondary | ICD-10-CM | POA: Diagnosis not present

## 2022-08-24 DIAGNOSIS — E2740 Unspecified adrenocortical insufficiency: Secondary | ICD-10-CM | POA: Diagnosis not present

## 2022-08-24 DIAGNOSIS — Z125 Encounter for screening for malignant neoplasm of prostate: Secondary | ICD-10-CM | POA: Diagnosis not present

## 2022-09-01 ENCOUNTER — Encounter: Payer: Self-pay | Admitting: Cardiovascular Disease

## 2022-09-01 ENCOUNTER — Ambulatory Visit: Payer: Medicare HMO | Attending: Cardiovascular Disease | Admitting: Cardiovascular Disease

## 2022-09-01 VITALS — BP 104/80 | HR 80 | Ht 72.0 in | Wt 398.0 lb

## 2022-09-01 DIAGNOSIS — E782 Mixed hyperlipidemia: Secondary | ICD-10-CM | POA: Diagnosis not present

## 2022-09-01 DIAGNOSIS — I25118 Atherosclerotic heart disease of native coronary artery with other forms of angina pectoris: Secondary | ICD-10-CM

## 2022-09-01 DIAGNOSIS — Z8639 Personal history of other endocrine, nutritional and metabolic disease: Secondary | ICD-10-CM | POA: Diagnosis not present

## 2022-09-01 DIAGNOSIS — E039 Hypothyroidism, unspecified: Secondary | ICD-10-CM

## 2022-09-01 NOTE — Progress Notes (Signed)
[facility] Office Note:    Date:  09/01/2022   ID:  [name] [name], DOB 1965-08-23, MRN [id number]  PCP:  [name], MD   [facility] Cardiologist:  Sanda Klein, MD     Referring MD: [name], MD   Chief Complaint  Patient presents with   Coronary Artery Disease   History of Present Illness:    [name] [name] is a 57 y.o. 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  He is doing quite well since his last appointment has not had any angina pectoris.  He has occasional left-sided chest discomfort that is clearly positional.  It is musculoskeletal.  He is no longer on clopidogrel, now 12 months following stent implantation.  He has not required any sublingual nitroglycerin.  He does complains of generalized lack of energy and generalized muscle aches.  He wonders whether this could be due to his statin medication.  He did have side effects with rosuvastatin in the past and is currently on atorvastatin.  He has had problems with bilateral ureteral stones and has undergone procedures with Dr. Louis Meckel to resolve these.  He will need additional procedures in the future.  The patient specifically denies any chest pain at rest , dyspnea at rest or with exertion, orthopnea, paroxysmal nocturnal dyspnea, syncope, palpitations, focal neurological deficits, intermittent claudication, lower extremity edema, unexplained weight gain, cough, hemoptysis or wheezing.  Labs including lipid panel were performed at [place] recently.  Those results are not currently available for review.  Due to the change in insurance coverage  he is no longer able to get Ozempic and his weight has rebounded.  [name] [name] is limited to transition by sliding from his electric scooter to bed or commode.  He cannot walk.  About 20 years ago he was slim and used to be a runner but then developed steady weight gain, had to undergo multiple colon surgeries, complicated by a protracted course with abdominal wound infection requiring prolonged antibiotics.  He developed sequential bilateral Achilles tendon rupture, probably due to prolonged treatment with Cipro.  He was subsequently diagnosed with Cushing syndrome, which further limited his ability to be active due to proximal muscle weakness and atrophy.  Because of this, his ability to perform any type of physical exertion is severely limited.  He had normal ABIs on 06/15/2021.  An echocardiogram in November 2015 describes an EF of 45-50% with inferior wall hypokinesis.  He has normal renal function.  He does not have diabetes mellitus but is taking Ozempic for weight loss and has managed to lose 60 pounds since therapy initiation.  His lipid profile showed an LDL cholesterol of 84 and he has started on rosuvastatin 5 mg daily.  Labs on 07/11/2021 showed that his LDL is now down to 64, but HDL is also lower at 35.  His paternal grandfather (smoker) died of a myocardial infarction at age 2 and he has 2 paternal uncles that also died of myocardial infarction, albeit at more advanced ages.  His grandmother also had coronary disease despite a very healthy lifestyle and lean body habitus.  Past Medical History:  Diagnosis Date   Allergic rhinitis    Anxiety    Arthritis    Chronic kidney disease  Depression    Diastolic CHF ([place])    Gout    H/O acute respiratory failure    History of kidney stones    Hypertension    Hypothyroidism    Morbid obesity ([place])    Peripheral neuropathy    Pituitary tumor    PONV (postoperative nausea and vomiting)    Sleep apnea    uses bi-pap    Past Surgical  History:  Procedure Laterality Date   ABDOMINAL SURGERY  06/2013   for cyst with MRSA   ACHILLES TENDON REPAIR Bilateral    APPLICATION OF A-CELL OF CHEST/ABDOMEN N/A 01/26/2014   Procedure: PLACEMENT OF A CELL AND VAC ;  Surgeon: [name], DO;  Location: [place];  Service: Plastics;  Laterality: N/A;   APPLICATION OF A-CELL OF CHEST/ABDOMEN N/A 02/02/2014   Procedure: WITH PLACEMENT OF A CELL AND VAC ;  Surgeon: [name], DO;  Location: [place];  Service: Plastics;  Laterality: N/A;   BRAIN SURGERY     COLON RESECTION     CORONARY STENT INTERVENTION N/A 07/28/2021   Procedure: CORONARY STENT INTERVENTION;  Surgeon: Martinique, Peter M, MD;  Location: [facility];  Service: Cardiovascular;  Laterality: N/A;   CRANIOTOMY N/A 12/10/2013   Procedure: Transpheniodal resection of Pituitary tumor with Dr. Radene Journey for approach;  Surgeon: Floyce Stakes, MD;  Location: [facility];  Service: Neurosurgery;  Laterality: N/A;  Transpheniodal resection of Pituitary tumor with Dr. Radene Journey for approach   CYSTOSCOPY WITH RETROGRADE PYELOGRAM, URETEROSCOPY AND STENT PLACEMENT Left 05/09/2022   Procedure: [place] AND STENT PLACEMENT;  Surgeon: Ardis Hughs, MD;  Location: [facility];  Service: Urology;  Laterality: Left;   CYSTOSCOPY/URETEROSCOPY/HOLMIUM LASER/STENT PLACEMENT Right 07/27/2022   Procedure: CYSTOSCOPY RIGHT RETROGRADE /URETEROSCOPY/HOLMIUM LASER/RIGHT URETERAL STENT EXCHANGE;  Surgeon: Ardis Hughs, MD;  Location: [facility];  Service: Urology;  Laterality: Right;  75 MINUTES NEEDED FOR CASE   HERNIA REPAIR     INCISION AND DRAINAGE OF WOUND N/A 01/26/2014   Procedure: IRRIGATION AND DEBRIDEMENT ABDOMINAL WOUND WITH ;  Surgeon: [name], DO;  Location: [place];  Service: Plastics;  Laterality: N/A;   INCISION AND DRAINAGE OF WOUND N/A 02/02/2014   Procedure: IRRIGATION AND DEBRIDEMENT ABDOMINAL WOUND;  Surgeon: [name], DO;  Location: [place];  Service: Plastics;  Laterality: N/A;   INCISION AND DRAINAGE OF WOUND N/A 02/12/2014   Procedure: IRRIGATION AND DEBRIDEMENT OF ABDOMINAL WOUND/PLACEMENT OF A-CELL AND VAC;  Surgeon: [name], DO;  Location: [place];  Service: Plastics;  Laterality: N/A;   INCISION AND DRAINAGE OF WOUND N/A 04/01/2014   Procedure: IRRIGATION AND DEBRIDEMENT ABDOMINAL WOUND WITH PLACEMENT OF ACELL/VAC;  Surgeon: [name], DO;  Location: [place];  Service: Plastics;  Laterality: N/A;   LEFT HEART CATH AND CORONARY ANGIOGRAPHY N/A 07/28/2021   Procedure: LEFT HEART CATH AND CORONARY ANGIOGRAPHY;  Surgeon: Martinique, Peter M, MD;  Location: [place] [facility];  Service: Cardiovascular;  Laterality: N/A;   Sigmoid Colonoscopy     TRANSNASAL APPROACH N/A 12/10/2013   Procedure: TRANSNASAL APPROACH;  Surgeon: Rozetta Nunnery, MD;  Location: [facility];  Service: ENT;  Laterality: N/A;    Current Medications: Current Meds  Medication Sig   acetaminophen (TYLENOL) 325 MG tablet Take 2 tablets (650 mg total) by mouth every 6 (six) hours as needed for mild pain (or Fever >/= 101).   allopurinol (ZYLOPRIM) 300 MG tablet Take 300 mg  by mouth daily.   aspirin 81 MG chewable tablet Chew 81 mg by mouth daily.   atorvastatin (LIPITOR) 20 MG tablet Take 1 tablet (20 mg total) by mouth daily.   buPROPion (WELLBUTRIN XL) 300 MG 24 hr tablet Take 300 mg by mouth daily.   carboxymethylcellulose (REFRESH PLUS) 0.5 % SOLN Place 2 drops into both eyes daily as needed (for dry eyes).   diclofenac sodium (VOLTAREN) 1 % GEL Apply 1 Application topically daily as needed (pain).   Diclofenac-miSOPROStol 75-0.2 MG TBEC Take 1 tablet by mouth 2 (two) times daily.    fluticasone (FLONASE) 50 MCG/ACT nasal spray Place 1 spray into both nostrils daily as needed for allergies or rhinitis.   hydrocortisone (CORTEF) 20 MG tablet Take 20 mg by mouth daily.   isosorbide mononitrate (IMDUR) 30 MG 24 hr tablet TAKE 1 TABLET DAILY    levothyroxine (SYNTHROID) 125 MCG tablet Take 125 mcg by mouth daily before breakfast.   metoprolol succinate (TOPROL-XL) 25 MG 24 hr tablet Take 1 tablet (25 mg total) by mouth daily.   pantoprazole (PROTONIX) 40 MG tablet Take 40 mg by mouth daily.   pseudoephedrine (SUDAFED) 120 MG 12 hr tablet Take 120 mg by mouth every 12 (twelve) hours as needed for congestion.   Semaglutide, 1 MG/DOSE, (OZEMPIC, 1 MG/DOSE,) 4 MG/3ML SOPN Inject 1 mg into the skin every Sunday.   sertraline (ZOLOFT) 100 MG tablet Take 150 mg by mouth daily. Taking 1 & 1/2 tablets   tamsulosin (FLOMAX) 0.4 MG CAPS capsule Take 0.4 mg by mouth daily.   traMADol (ULTRAM) 50 MG tablet Take 50 mg by mouth daily in the afternoon. 1-2 tablets daily     Allergies:   Morphine and Penicillins   Social History   Socioeconomic History   Marital status: Single    Spouse name: Not on file   Number of children: Not on file   Years of education: Not on file   Highest education level: Not on file  Occupational History   Not on file  Tobacco Use   Smoking status: Never   Smokeless tobacco: Never  Vaping Use   Vaping Use: Never used  Substance and Sexual Activity   Alcohol use: No   Drug use: No   Sexual activity: Never  Other Topics Concern   Not on file  Social History Narrative   Not on file   Social Determinants of Health   Financial Resource Strain: Not on file  Food Insecurity: No Food Insecurity (05/10/2022)   Hunger Vital Sign    Worried About Running Out of Food in the Last Year: Never true    Ran Out of Food in the Last Year: Never true  Transportation Needs: No Transportation Needs (05/10/2022)   PRAPARE - Hydrologist (Medical): No    Lack of Transportation (Non-Medical): No  Physical Activity: Not on file  Stress: Not on file  Social Connections: Not on file     Family History: The patient's family history includes Cancer in his maternal aunt and maternal grandmother;  Gout in his brother and father; Hypertension in his father and mother; Hypothyroidism in his father.  ROS:   Please see the history of present illness.     All other systems reviewed and are negative.  EKGs/Labs/Other Studies Reviewed:    The following studies were reviewed today:  Echocardiogram 12/19/2013  - Left ventricle: The cavity size was normal. Wall thickness was    increased  in a pattern of mild LVH. Systolic function was mildly    reduced. The estimated ejection fraction was in the range of 45%    to 50%. There is hypokinesis of the inferior myocardium.  - Mitral valve: There was mild regurgitation.  - Right ventricle: The cavity size was moderately dilated. Wall    thickness was normal. Systolic function was mildly reduced.     Cardiac catheterization 07/28/2021    Prox LAD to Mid LAD lesion is 85% stenosed.   1st Mrg lesion is 25% stenosed.   Prox RCA lesion is 25% stenosed.   A drug-eluting stent was successfully placed using a STENT ONYX FRONTIER 4.0X18.   Post intervention, there is a 0% residual stenosis.   The left ventricular systolic function is normal.   LV end diastolic pressure is mildly elevated.   The left ventricular ejection fraction is 55-65% by visual estimate.   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 The vessel is tortuous proximally. There is diffuse ectasia of the mid vessel Normal LV function Mildly elevated LVEDP Successful PCI of the proximal LAD with DES.  Complicated by side branch occlusion of small diagonal branch   Plan: will treat with IV Ntg. Observe overnight. Plan DAPT for one year. Anticipate DC tomorrow if stable.    Diagnostic Dominance: Right Intervention  Implants     Permanent Stent  Stent Onyx Frontier 4.0x18     EKG:  EKG is not ordered today, 05/25/2022 tracing reviewed,  shows normal sinus rhythm (one PAC), mild 1st deg AV block, otw is a normal tracing.  Recent Labs: 05/09/2022: ALT  28 05/16/2022: Magnesium 2.3 07/18/2022: BUN 19; Creatinine, Ser 1.26; Hemoglobin 12.9; Platelets 122; Potassium 3.8; Sodium 139  07/11/2021 Creatinine 1.00, BUN 15, potassium 4.3 A.m. cortisol 2.1 Hemoglobin A1c 5.1%, TSH 0.02, free T4 normal 1.7  Recent Lipid Panel    Component Value Date/Time   TRIG 174 (H) 12/10/2013 2300  07/11/2021 cholesterol 120, HDL 35, LDL 64, triglycerides 102    Risk Assessment/Calculations:           Physical Exam:    VS:  BP 104/80 (BP Location: Left Arm, Patient Position: Sitting, Cuff Size: Large)   Pulse 80   Ht 6' (1.829 m)   Wt (!) 398 lb (180.5 kg) Comment: Patient estimated  SpO2 95%   BMI 53.98 kg/m     Wt Readings from Last 3 Encounters:  09/01/22 (!) 398 lb (180.5 kg)  07/27/22 (!) 396 lb 13.3 oz (180 kg)  07/18/22 (!) 398 lb (180.5 kg)     General: Alert, oriented x3, no distress, super-morbid obesity.  In a wheelchair. Head: no evidence of trauma, PERRL, EOMI, no exophtalmos or lid lag, no myxedema, no xanthelasma; normal ears, nose and oropharynx Neck: normal jugular venous pulsations and no hepatojugular reflux; brisk carotid pulses without delay and no carotid bruits Chest: clear to auscultation, no signs of consolidation by percussion or palpation, normal fremitus, symmetrical and full respiratory excursions Cardiovascular: normal position and quality of the apical impulse, regular rhythm, normal first and second heart sounds, no murmurs, rubs or gallops Abdomen: no tenderness or distention, no masses by palpation, no abnormal pulsatility or arterial bruits, normal bowel sounds, no hepatosplenomegaly Extremities: no clubbing, cyanosis or edema; 2+ radial, ulnar and brachial pulses bilaterally; 2+ right femoral, posterior tibial and dorsalis pedis pulses; 2+ left femoral, posterior tibial and dorsalis pedis pulses; no subclavian or femoral bruits Neurological: grossly nonfocal Psych: Normal mood and  affect      ASSESSMENT:    1. Coronary artery disease of native artery of native heart with stable angina pectoris ([place])   2. Mixed hyperlipidemia   3. Morbid obesity ([place])   4. History of adrenal insufficiency   5. Acquired hypothyroidism       PLAN:    In order of problems listed above:  CAD: Currently asymptomatic.  His left-sided chest pain is clearly musculoskeletal.  He does have a jailed diagonal branch but has not required sublingual nitroglycerin.  He is on a low-dose of metoprolol and a low-dose of isosorbide mononitrate.  Continue aspirin, beta-blocker and lipid-lowering therapy. HLP: Is having similar side effects with atorvastatin like he had with rosuvastatin.  Will place on a "statin holiday" for a couple of months and then consider rechallenge, if he remains symptomatic we will switch to a PCSK9 inhibitor such as Repatha or Praluent.  Target LDL less than 70.  Get labs from [facility]. Superobesity: Unfortunately, insurance no longer covers Ozempic.  Physical activity is severely limited by his back problems.  He recognizes that it is harder to control his eating now that he is off the GLP-1 agonist Adrenal insufficiency status post pituitary resection: Followed by his endocrinologist at [facility].  On hydrocortisone supplement. May need stress doses of hydrocortisone if he receives general anesthesia or has complications. Acquired hypothyroidism: On levothyroxine supplementation.    Medication Adjustments/Labs and Tests Ordered: Current medicines are reviewed at length with the patient today.  Concerns regarding medicines are outlined above.  No orders of the defined types were placed in this encounter.  No orders of the defined types were placed in this encounter.   Patient Instructions  Medication Instructions:  Stop Atorvastatin for 2 months, then message Dr Sallyanne Kuster to let him know how it went    *If you need a refill on your cardiac  medications before your next appointment, please call your pharmacy*  Follow-Up: At [facility], you and your health needs are our priority.  As part of our continuing mission to provide you with exceptional heart care, we have created designated Provider Care Teams.  These Care Teams include your primary Cardiologist (physician) and Advanced Practice Providers (APPs -  Physician Assistants and Nurse Practitioners) who all work together to provide you with the care you need, when you need it.  We recommend signing up for the patient portal called "MyChart".  Sign up information is provided on this After Visit Summary.  MyChart is used to connect with patients for Virtual Visits (Telemedicine).  Patients are able to view lab/test results, encounter notes, upcoming appointments, etc.  Non-urgent messages can be sent to your provider as well.   To learn more about what you can do with MyChart, go to [email or web address].    Your next appointment:   1 year(s)  Provider:   Sanda Klein, MD        Signed, Sanda Klein, MD  09/01/2022 8:03 PM    [place]

## 2022-09-01 NOTE — Patient Instructions (Signed)
Medication Instructions:  Stop Atorvastatin for 2 months, then message Dr Sallyanne Kuster to let him know how it went    *If you need a refill on your cardiac medications before your next appointment, please call your pharmacy*  Follow-Up: At [facility], you and your health needs are our priority.  As part of our continuing mission to provide you with exceptional heart care, we have created designated Provider Care Teams.  These Care Teams include your primary Cardiologist (physician) and Advanced Practice Providers (APPs -  Physician Assistants and Nurse Practitioners) who all work together to provide you with the care you need, when you need it.  We recommend signing up for the patient portal called "MyChart".  Sign up information is provided on this After Visit Summary.  MyChart is used to connect with patients for Virtual Visits (Telemedicine).  Patients are able to view lab/test results, encounter notes, upcoming appointments, etc.  Non-urgent messages can be sent to your provider as well.   To learn more about what you can do with MyChart, go to [email or web address].    Your next appointment:   1 year(s)  Provider:   Sanda Klein, MD

## 2022-10-02 DIAGNOSIS — N200 Calculus of kidney: Secondary | ICD-10-CM | POA: Diagnosis not present

## 2022-12-19 ENCOUNTER — Other Ambulatory Visit (HOSPITAL_COMMUNITY): Payer: Self-pay

## 2023-01-15 ENCOUNTER — Other Ambulatory Visit: Payer: Self-pay | Admitting: Cardiovascular Disease

## 2023-01-19 DIAGNOSIS — I10 Essential (primary) hypertension: Secondary | ICD-10-CM | POA: Diagnosis not present

## 2023-01-19 DIAGNOSIS — E038 Other specified hypothyroidism: Secondary | ICD-10-CM | POA: Diagnosis not present

## 2023-01-19 DIAGNOSIS — R7303 Prediabetes: Secondary | ICD-10-CM | POA: Diagnosis not present

## 2023-01-19 DIAGNOSIS — E230 Hypopituitarism: Secondary | ICD-10-CM | POA: Diagnosis not present

## 2023-01-19 DIAGNOSIS — I2510 Atherosclerotic heart disease of native coronary artery without angina pectoris: Secondary | ICD-10-CM | POA: Diagnosis not present

## 2023-01-19 DIAGNOSIS — Z125 Encounter for screening for malignant neoplasm of prostate: Secondary | ICD-10-CM | POA: Diagnosis not present

## 2023-01-19 DIAGNOSIS — E2740 Unspecified adrenocortical insufficiency: Secondary | ICD-10-CM | POA: Diagnosis not present

## 2023-01-24 DIAGNOSIS — I700 Atherosclerosis of aorta: Secondary | ICD-10-CM | POA: Diagnosis not present

## 2023-01-24 DIAGNOSIS — R1031 Right lower quadrant pain: Secondary | ICD-10-CM | POA: Diagnosis not present

## 2023-01-24 DIAGNOSIS — D696 Thrombocytopenia, unspecified: Secondary | ICD-10-CM | POA: Diagnosis not present

## 2023-01-24 DIAGNOSIS — I25119 Atherosclerotic heart disease of native coronary artery with unspecified angina pectoris: Secondary | ICD-10-CM | POA: Diagnosis not present

## 2023-01-24 DIAGNOSIS — Z0000 Encounter for general adult medical examination without abnormal findings: Secondary | ICD-10-CM | POA: Diagnosis not present

## 2023-01-24 DIAGNOSIS — M25562 Pain in left knee: Secondary | ICD-10-CM | POA: Diagnosis not present

## 2023-01-24 DIAGNOSIS — D649 Anemia, unspecified: Secondary | ICD-10-CM | POA: Diagnosis not present

## 2023-01-24 DIAGNOSIS — G4733 Obstructive sleep apnea (adult) (pediatric): Secondary | ICD-10-CM | POA: Diagnosis not present

## 2023-01-24 DIAGNOSIS — G8929 Other chronic pain: Secondary | ICD-10-CM | POA: Diagnosis not present

## 2023-01-24 DIAGNOSIS — F339 Major depressive disorder, recurrent, unspecified: Secondary | ICD-10-CM | POA: Diagnosis not present

## 2023-01-24 DIAGNOSIS — M25561 Pain in right knee: Secondary | ICD-10-CM | POA: Diagnosis not present

## 2023-01-25 ENCOUNTER — Encounter: Payer: Self-pay | Admitting: Internal Medicine

## 2023-01-25 DIAGNOSIS — D649 Anemia, unspecified: Secondary | ICD-10-CM | POA: Diagnosis not present

## 2023-01-25 DIAGNOSIS — R1031 Right lower quadrant pain: Secondary | ICD-10-CM | POA: Diagnosis not present

## 2023-01-26 DIAGNOSIS — R1031 Right lower quadrant pain: Secondary | ICD-10-CM | POA: Diagnosis not present

## 2023-01-26 LAB — LAB REPORT - SCANNED
A1c: 5.2
EGFR (Non-African Amer.): 68

## 2023-01-30 ENCOUNTER — Encounter: Payer: Self-pay | Admitting: Internal Medicine

## 2023-02-13 DIAGNOSIS — R197 Diarrhea, unspecified: Secondary | ICD-10-CM | POA: Diagnosis not present

## 2023-02-13 DIAGNOSIS — M79604 Pain in right leg: Secondary | ICD-10-CM | POA: Diagnosis not present

## 2023-02-13 DIAGNOSIS — Z23 Encounter for immunization: Secondary | ICD-10-CM | POA: Diagnosis not present

## 2023-02-13 DIAGNOSIS — M79605 Pain in left leg: Secondary | ICD-10-CM | POA: Diagnosis not present

## 2023-02-19 ENCOUNTER — Encounter: Payer: Self-pay | Admitting: Family Medicine

## 2023-02-19 ENCOUNTER — Ambulatory Visit: Payer: Medicare HMO | Admitting: Family Medicine

## 2023-02-19 ENCOUNTER — Ambulatory Visit (INDEPENDENT_AMBULATORY_CARE_PROVIDER_SITE_OTHER): Payer: Medicare HMO

## 2023-02-19 VITALS — BP 132/90 | HR 78 | Ht 72.0 in

## 2023-02-19 DIAGNOSIS — M25462 Effusion, left knee: Secondary | ICD-10-CM | POA: Diagnosis not present

## 2023-02-19 DIAGNOSIS — G8929 Other chronic pain: Secondary | ICD-10-CM

## 2023-02-19 DIAGNOSIS — M170 Bilateral primary osteoarthritis of knee: Secondary | ICD-10-CM

## 2023-02-19 DIAGNOSIS — M1712 Unilateral primary osteoarthritis, left knee: Secondary | ICD-10-CM | POA: Diagnosis not present

## 2023-02-19 DIAGNOSIS — M1711 Unilateral primary osteoarthritis, right knee: Secondary | ICD-10-CM | POA: Diagnosis not present

## 2023-02-19 DIAGNOSIS — M25461 Effusion, right knee: Secondary | ICD-10-CM | POA: Diagnosis not present

## 2023-02-19 DIAGNOSIS — M25561 Pain in right knee: Secondary | ICD-10-CM | POA: Diagnosis not present

## 2023-02-19 DIAGNOSIS — M25562 Pain in left knee: Secondary | ICD-10-CM | POA: Diagnosis not present

## 2023-02-19 DIAGNOSIS — H2513 Age-related nuclear cataract, bilateral: Secondary | ICD-10-CM | POA: Diagnosis not present

## 2023-02-19 DIAGNOSIS — H5213 Myopia, bilateral: Secondary | ICD-10-CM | POA: Diagnosis not present

## 2023-02-19 NOTE — Patient Instructions (Addendum)
Thank you for coming in today.   I've referred you to [facility].  Let us know if you don't hear from them in one week.   Please get an Xray today before you leave   You received an injection today. Seek immediate medical attention if the joint becomes red, extremely painful, or is oozing fluid.   Let me know how it goes

## 2023-02-19 NOTE — Progress Notes (Unsigned)
I, Stevenson Clinch, CMA acting as a scribe for [name], MD.  [name] [name] is a 57 y.o. male who presents to [facility] at [facility] today for bilat leg pain x 1 month. Pt c/o pain waking him out throughout the night. Pt locates pain to both legs and knees. Has been seen by Ortho in the past, advised not a candidate for surgery. Hx of bilat achilles tendon rupture s/p multiple treatments with antibiotics.   Low back pain: no Radiating pain: no LE numbness/tingling: yes, intermittently in the feet LE weakness: wheelchair Aggravates: prolonged time sitting Treatments tried: trazodone, Ambien, ropinirole, gabapentin  Pertinent review of systems: No fevers or chills  Relevant historical information: History of Cushing syndrome on chronic prednisone secondary to a brain tumor.  This is caused significant obesity.  He is effectively wheelchair-bound.   Exam:  BP (!) 132/90   Pulse 78   Ht 6' (1.829 m)   SpO2 98%   BMI 53.98 kg/m  General: Well Developed, well nourished, and in no acute distress.   MSK: Knees bilaterally no obvious joint effusion.  Decreased range of motion.  Crepitations present both knees with range of motion.  Decreased strength.    Lab and Radiology Results  Anterior knee injection: Left Consent obtained and timeout performed. Risks reviewed including potential risk for infection, hyperglycemia etc. Area medial to the patella tendon identified and marked. Skin sterilized with isopropyl alcohol. Cold spray applied 22-gauge needle used to access the knee joint and 40 mg of Kenalog and 2 mL of Marcaine injected into the knee joint Patient tolerated the procedure well.  Anterior knee injection: Right Consent obtained and timeout performed. Risks reviewed including potential risk for infection, hyperglycemia etc. Area medial to the patella tendon identified and marked. Skin sterilized with isopropyl alcohol. Cold spray  applied 22-gauge needle used to access the knee joint and 40 mg of Kenalog and 2 mL of Marcaine injected into the knee joint Patient tolerated the procedure well.   Xray images bilateral knees obtained today personally and independently interpreted  Right knee: Severe end-stage DJD  Left knee: Severe end-stage bone-on-bone DJD.  No acute fractures are present.  Await formal radiology review  Assessment and Plan: 57 y.o. male with bilateral knee pain secondary to significant DJD obesity and impaired mobility.  Plan to treat multifactorially.  He is already working on weight loss which I think will be helpful.  Plan for bilateral steroid injections today.  If this does not provide lasting benefit we can work on authorization for hyaluronic acid injections and Zilretta injection which may be more beneficial.  Additionally will refer to PT for aquatic PT trial which she has not had yet.  Ultimately if not improved he may be a good candidate for geniculate artery embolization.  Unfortunately as things stand he is not a candidate for total knee replacement.   PDMP not reviewed this encounter. Orders Placed This Encounter  Procedures   DG Knee AP/LAT W/Sunrise Right    Standing Status:   Future    Number of Occurrences:   1    Standing Expiration Date:   03/21/2023    Order Specific Question:   Reason for Exam (SYMPTOM  OR DIAGNOSIS REQUIRED)    Answer:   bilateral knee pain    Order Specific Question:   Preferred imaging location?    Answer:   [facility]   DG Knee AP/LAT W/Sunrise Left    Standing Status:  Future    Number of Occurrences:   1    Standing Expiration Date:   03/21/2023    Order Specific Question:   Reason for Exam (SYMPTOM  OR DIAGNOSIS REQUIRED)    Answer:   bilateral knee pain    Order Specific Question:   Preferred imaging location?    Answer:   [facility]   Ambulatory referral to Physical Therapy    Referral Priority:   Routine    Referral  Type:   Physical Medicine    Referral Reason:   Specialty Services Required    Requested Specialty:   Physical Therapy    Number of Visits Requested:   1   No orders of the defined types were placed in this encounter.    Discussed warning signs or symptoms. Please see discharge instructions. Patient expresses understanding.   The above documentation has been reviewed and is accurate and complete [name], M.D.

## 2023-02-22 DIAGNOSIS — Z9861 Coronary angioplasty status: Secondary | ICD-10-CM | POA: Diagnosis not present

## 2023-02-22 DIAGNOSIS — M791 Myalgia, unspecified site: Secondary | ICD-10-CM | POA: Diagnosis not present

## 2023-02-22 DIAGNOSIS — T466X5A Adverse effect of antihyperlipidemic and antiarteriosclerotic drugs, initial encounter: Secondary | ICD-10-CM | POA: Diagnosis not present

## 2023-02-22 DIAGNOSIS — I10 Essential (primary) hypertension: Secondary | ICD-10-CM | POA: Diagnosis not present

## 2023-02-22 DIAGNOSIS — I25119 Atherosclerotic heart disease of native coronary artery with unspecified angina pectoris: Secondary | ICD-10-CM | POA: Diagnosis not present

## 2023-02-22 DIAGNOSIS — E7800 Pure hypercholesterolemia, unspecified: Secondary | ICD-10-CM | POA: Diagnosis not present

## 2023-02-24 ENCOUNTER — Other Ambulatory Visit: Payer: Self-pay | Admitting: Cardiovascular Disease

## 2023-03-05 NOTE — Progress Notes (Signed)
Right knee x-ray shows severe arthritis

## 2023-03-05 NOTE — Progress Notes (Signed)
Left knee x-ray shows severe arthritis

## 2023-03-08 ENCOUNTER — Other Ambulatory Visit (HOSPITAL_COMMUNITY): Payer: Self-pay

## 2023-03-20 ENCOUNTER — Ambulatory Visit (HOSPITAL_BASED_OUTPATIENT_CLINIC_OR_DEPARTMENT_OTHER): Payer: Medicare HMO | Admitting: Physical Therapy

## 2023-04-11 ENCOUNTER — Other Ambulatory Visit (HOSPITAL_COMMUNITY): Payer: Self-pay

## 2023-04-11 MED ORDER — WEGOVY 1 MG/0.5ML ~~LOC~~ SOAJ
1.0000 mg | SUBCUTANEOUS | 0 refills | Status: DC
  Filled 2023-07-03: qty 2, 28d supply, fill #0

## 2023-04-11 MED ORDER — WEGOVY 0.5 MG/0.5ML ~~LOC~~ SOAJ
0.5000 mg | SUBCUTANEOUS | 0 refills | Status: DC
  Filled 2023-05-16: qty 2, 28d supply, fill #0

## 2023-04-11 MED ORDER — WEGOVY 0.25 MG/0.5ML ~~LOC~~ SOAJ
0.2500 mg | SUBCUTANEOUS | 0 refills | Status: DC
  Filled 2023-04-11: qty 2, 28d supply, fill #0

## 2023-04-12 ENCOUNTER — Other Ambulatory Visit (HOSPITAL_BASED_OUTPATIENT_CLINIC_OR_DEPARTMENT_OTHER): Payer: Self-pay

## 2023-04-12 ENCOUNTER — Other Ambulatory Visit (HOSPITAL_COMMUNITY): Payer: Self-pay

## 2023-04-13 ENCOUNTER — Other Ambulatory Visit (HOSPITAL_COMMUNITY): Payer: Self-pay

## 2023-05-09 ENCOUNTER — Other Ambulatory Visit: Payer: Self-pay | Admitting: Internal Medicine

## 2023-05-09 DIAGNOSIS — R1031 Right lower quadrant pain: Secondary | ICD-10-CM

## 2023-05-10 ENCOUNTER — Other Ambulatory Visit: Payer: Self-pay | Admitting: Cardiovascular Disease

## 2023-05-16 ENCOUNTER — Other Ambulatory Visit (HOSPITAL_COMMUNITY): Payer: Self-pay

## 2023-05-16 MED ORDER — WEGOVY 0.25 MG/0.5ML ~~LOC~~ SOAJ
0.2500 mg | SUBCUTANEOUS | 11 refills | Status: DC
  Filled 2023-05-16: qty 2, 28d supply, fill #0

## 2023-05-21 ENCOUNTER — Other Ambulatory Visit (HOSPITAL_COMMUNITY): Payer: Self-pay

## 2023-05-25 ENCOUNTER — Other Ambulatory Visit (HOSPITAL_COMMUNITY): Payer: Self-pay

## 2023-05-31 ENCOUNTER — Other Ambulatory Visit (HOSPITAL_COMMUNITY): Payer: Self-pay

## 2023-07-03 ENCOUNTER — Other Ambulatory Visit (HOSPITAL_COMMUNITY): Payer: Self-pay

## 2023-07-12 ENCOUNTER — Other Ambulatory Visit (HOSPITAL_COMMUNITY): Payer: Self-pay

## 2023-07-17 ENCOUNTER — Ambulatory Visit: Payer: Medicare HMO | Admitting: Family Medicine

## 2023-07-17 ENCOUNTER — Other Ambulatory Visit: Payer: Self-pay

## 2023-07-17 ENCOUNTER — Encounter: Payer: Self-pay | Admitting: Family Medicine

## 2023-07-17 VITALS — BP 122/84 | HR 53 | Ht 72.0 in

## 2023-07-17 DIAGNOSIS — M25562 Pain in left knee: Secondary | ICD-10-CM

## 2023-07-17 DIAGNOSIS — M25561 Pain in right knee: Secondary | ICD-10-CM

## 2023-07-17 DIAGNOSIS — M170 Bilateral primary osteoarthritis of knee: Secondary | ICD-10-CM

## 2023-07-17 DIAGNOSIS — G8929 Other chronic pain: Secondary | ICD-10-CM

## 2023-07-17 NOTE — Patient Instructions (Signed)
Thank you for coming in today.   You received an injection today. Seek immediate medical attention if the joint becomes red, extremely painful, or is oozing fluid.

## 2023-07-17 NOTE — Progress Notes (Signed)
I, Stevenson Clinch, CMA acting as a scribe for [name], MD.  [name] [name] is a 58 y.o. male who presents to [facility] at [facility] today for exacerbation of his bilat knee pain. Pt was last seen by Dr. Denyse Amass on 02/19/23 and was given bilat knee steroid injections (anterior approach) and was referred to aquatic PT, but canceled his 1st visit.  Today, pt reports worsening bilat knee pain over the past 2 weeks. Got significant improvement with last knee injections. Sx causing night disturbance, unable to sleep at all last night d/t knee sx.   Dx imaging: 02/19/23 R & L knee XR  Pertinent review of systems: No fevers or chills  Relevant historical information: Morbid obesity.  Cushing's disease.  Patient requires steroids.   Exam:  BP 122/84   Pulse (!) 53   Ht 6' (1.829 m)   SpO2 99%   BMI 53.98 kg/m  General: Well Developed, well nourished, and in no acute distress.   MSK: Knees bilaterally mild effusion.  Decreased range of motion.    Lab and Radiology Results  Procedure: Real-time Ultrasound Guided Injection of left knee joint anterior medial approach Device: Philips Affiniti 50G/GE Logiq Images permanently stored and available for review in PACS Verbal informed consent obtained.  Discussed risks and benefits of procedure. Warned about infection, bleeding, hyperglycemia damage to structures among others. Patient expresses understanding and agreement Time-out conducted.   Noted no overlying erythema, induration, or other signs of local infection.   Skin prepped in a sterile fashion.   Local anesthesia: Topical Ethyl chloride.   With sterile technique and under real time ultrasound guidance: 40 mg of Kenalog and 2 mL of Marcaine injected into knee joint. Fluid seen entering the joint capsule.   Completed without difficulty   Pain immediately resolved suggesting accurate placement of the medication.   Advised to call if fevers/chills, erythema,  induration, drainage, or persistent bleeding.   Images permanently stored and available for review in the ultrasound unit.  Impression: Technically successful ultrasound guided injection. Of note I did reposition the patient to do the injection successfully.  Procedure: Real-time Ultrasound Guided Injection of right knee joint anterior medial approach Device: Philips Affiniti 50G/GE Logiq Images permanently stored and available for review in PACS Verbal informed consent obtained.  Discussed risks and benefits of procedure. Warned about infection, bleeding, hyperglycemia damage to structures among others. Patient expresses understanding and agreement Time-out conducted.   Noted no overlying erythema, induration, or other signs of local infection.   Skin prepped in a sterile fashion.   Local anesthesia: Topical Ethyl chloride.   With sterile technique and under real time ultrasound guidance: 40 mg of Kenalog and 2 mL of Marcaine injected into knee joint. Fluid seen entering the joint capsule.   Completed without difficulty   Pain immediately resolved suggesting accurate placement of the medication.   Advised to call if fevers/chills, erythema, induration, drainage, or persistent bleeding.   Images permanently stored and available for review in the ultrasound unit.  Impression: Technically successful ultrasound guided injection.      Assessment and Plan: 58 y.o. 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  Plan for repeat steroid injection today.  Anterior medial approach used.  We did talk about hyaluronic acid injections.  We could get those authorized to use in the future if needed.  Recheck back as needed.   PDMP not reviewed this encounter. Orders Placed This Encounter  Procedures   [place] LIMITED  JOINT SPACE STRUCTURES LOW BILAT(NO LINKED CHARGES)    Reason for Exam (SYMPTOM  OR DIAGNOSIS REQUIRED):   bilat knee pain    Preferred imaging location?:   [facility]   No orders of the defined types were placed in this encounter.    Discussed warning signs or symptoms. Please see discharge instructions. Patient expresses understanding.   The above documentation has been reviewed and is accurate and complete [name], M.D.

## 2023-07-23 ENCOUNTER — Other Ambulatory Visit: Payer: Self-pay | Admitting: Cardiovascular Disease

## 2023-08-10 ENCOUNTER — Other Ambulatory Visit: Payer: Self-pay | Admitting: Cardiovascular Disease

## 2023-08-16 ENCOUNTER — Other Ambulatory Visit (HOSPITAL_COMMUNITY): Payer: Self-pay

## 2023-08-16 MED ORDER — ZEPBOUND 5 MG/0.5ML ~~LOC~~ SOAJ
5.0000 mg | SUBCUTANEOUS | 0 refills | Status: DC
  Filled 2023-09-27 (×3): qty 2, 28d supply, fill #0

## 2023-08-16 MED ORDER — ZEPBOUND 2.5 MG/0.5ML ~~LOC~~ SOAJ
2.5000 mg | SUBCUTANEOUS | 0 refills | Status: DC
  Filled 2023-08-16: qty 2, 28d supply, fill #0

## 2023-08-16 MED ORDER — ZEPBOUND 10 MG/0.5ML ~~LOC~~ SOAJ
10.0000 mg | SUBCUTANEOUS | 0 refills | Status: DC
  Filled 2023-12-31: qty 2, 28d supply, fill #0

## 2023-08-16 MED ORDER — ZEPBOUND 7.5 MG/0.5ML ~~LOC~~ SOAJ
7.5000 mg | SUBCUTANEOUS | 0 refills | Status: DC
  Filled 2023-12-04: qty 2, 28d supply, fill #0

## 2023-08-23 ENCOUNTER — Other Ambulatory Visit (HOSPITAL_COMMUNITY): Payer: Self-pay

## 2023-08-23 MED ORDER — REPATHA SURECLICK 140 MG/ML ~~LOC~~ SOAJ
SUBCUTANEOUS | 11 refills | Status: DC
  Filled 2023-08-23 – 2023-09-27 (×2): qty 2, 28d supply, fill #0
  Filled 2023-09-27: qty 2, 28d supply, fill #1
  Filled 2023-09-27: qty 2, 28d supply, fill #0
  Filled 2023-11-01: qty 2, 28d supply, fill #1
  Filled 2023-12-04: qty 2, 28d supply, fill #2
  Filled 2023-12-31: qty 2, 28d supply, fill #0

## 2023-08-24 ENCOUNTER — Other Ambulatory Visit (HOSPITAL_COMMUNITY): Payer: Self-pay

## 2023-08-27 DIAGNOSIS — E2740 Unspecified adrenocortical insufficiency: Secondary | ICD-10-CM | POA: Diagnosis not present

## 2023-08-27 DIAGNOSIS — E230 Hypopituitarism: Secondary | ICD-10-CM | POA: Diagnosis not present

## 2023-08-27 DIAGNOSIS — G629 Polyneuropathy, unspecified: Secondary | ICD-10-CM | POA: Diagnosis not present

## 2023-08-27 DIAGNOSIS — I10 Essential (primary) hypertension: Secondary | ICD-10-CM | POA: Diagnosis not present

## 2023-08-27 DIAGNOSIS — E038 Other specified hypothyroidism: Secondary | ICD-10-CM | POA: Diagnosis not present

## 2023-08-27 DIAGNOSIS — R7303 Prediabetes: Secondary | ICD-10-CM | POA: Diagnosis not present

## 2023-08-27 DIAGNOSIS — Z125 Encounter for screening for malignant neoplasm of prostate: Secondary | ICD-10-CM | POA: Diagnosis not present

## 2023-08-28 ENCOUNTER — Other Ambulatory Visit (HOSPITAL_COMMUNITY): Payer: Self-pay

## 2023-08-28 LAB — LAB REPORT - SCANNED
A1c: 5.2
EGFR: 66

## 2023-08-31 ENCOUNTER — Other Ambulatory Visit: Payer: Self-pay | Admitting: *Deleted

## 2023-08-31 DIAGNOSIS — D352 Benign neoplasm of pituitary gland: Secondary | ICD-10-CM

## 2023-09-27 ENCOUNTER — Other Ambulatory Visit (HOSPITAL_COMMUNITY): Payer: Self-pay

## 2023-09-27 ENCOUNTER — Other Ambulatory Visit: Payer: Self-pay

## 2023-09-27 DIAGNOSIS — E038 Other specified hypothyroidism: Secondary | ICD-10-CM | POA: Diagnosis not present

## 2023-09-27 DIAGNOSIS — E230 Hypopituitarism: Secondary | ICD-10-CM | POA: Diagnosis not present

## 2023-09-27 DIAGNOSIS — E2740 Unspecified adrenocortical insufficiency: Secondary | ICD-10-CM | POA: Diagnosis not present

## 2023-09-27 DIAGNOSIS — I10 Essential (primary) hypertension: Secondary | ICD-10-CM | POA: Diagnosis not present

## 2023-10-02 ENCOUNTER — Other Ambulatory Visit (HOSPITAL_COMMUNITY): Payer: Self-pay

## 2023-10-09 ENCOUNTER — Other Ambulatory Visit (HOSPITAL_COMMUNITY): Payer: Self-pay

## 2023-10-15 ENCOUNTER — Other Ambulatory Visit (HOSPITAL_COMMUNITY): Payer: Self-pay

## 2023-10-21 ENCOUNTER — Other Ambulatory Visit: Payer: Self-pay | Admitting: Cardiovascular Disease

## 2023-10-25 ENCOUNTER — Encounter: Payer: Self-pay | Admitting: Internal Medicine

## 2023-10-30 ENCOUNTER — Other Ambulatory Visit

## 2023-11-01 ENCOUNTER — Other Ambulatory Visit (HOSPITAL_COMMUNITY): Payer: Self-pay

## 2023-11-02 ENCOUNTER — Other Ambulatory Visit (HOSPITAL_COMMUNITY): Payer: Self-pay

## 2023-11-02 MED ORDER — ZEPBOUND 5 MG/0.5ML ~~LOC~~ SOAJ
5.0000 mg | SUBCUTANEOUS | 0 refills | Status: DC
  Filled 2023-11-02: qty 2, 28d supply, fill #0

## 2023-11-06 ENCOUNTER — Other Ambulatory Visit (HOSPITAL_BASED_OUTPATIENT_CLINIC_OR_DEPARTMENT_OTHER): Payer: Self-pay

## 2023-11-06 ENCOUNTER — Other Ambulatory Visit (HOSPITAL_COMMUNITY): Payer: Self-pay

## 2023-11-06 ENCOUNTER — Ambulatory Visit
Admission: RE | Admit: 2023-11-06 | Discharge: 2023-11-06 | Disposition: A | Discharge status: Home / Self Care | Source: Ambulatory Visit | Attending: Internal Medicine | Admitting: Internal Medicine

## 2023-11-06 DIAGNOSIS — R1031 Right lower quadrant pain: Secondary | ICD-10-CM

## 2023-11-06 DIAGNOSIS — R161 Splenomegaly, not elsewhere classified: Secondary | ICD-10-CM | POA: Diagnosis not present

## 2023-11-06 DIAGNOSIS — M7989 Other specified soft tissue disorders: Secondary | ICD-10-CM | POA: Diagnosis not present

## 2023-11-06 MED ORDER — IOPAMIDOL (ISOVUE-370) INJECTION 76%
100.0000 mL | Freq: Once | INTRAVENOUS | Status: AC | PRN
  Administered 2023-11-06: 100 mL via INTRAVENOUS

## 2023-11-16 ENCOUNTER — Other Ambulatory Visit: Payer: Self-pay | Admitting: Cardiovascular Disease

## 2023-11-19 DIAGNOSIS — F329 Major depressive disorder, single episode, unspecified: Secondary | ICD-10-CM | POA: Diagnosis not present

## 2023-11-19 DIAGNOSIS — N200 Calculus of kidney: Secondary | ICD-10-CM | POA: Diagnosis not present

## 2023-11-19 DIAGNOSIS — R234 Changes in skin texture: Secondary | ICD-10-CM | POA: Diagnosis not present

## 2023-11-22 ENCOUNTER — Telehealth: Payer: Self-pay | Admitting: *Deleted

## 2023-11-22 NOTE — Telephone Encounter (Signed)
 PC to patient, no answer, left VM - informed him he is due for 2-year MRI & appointment with Dr Mark Sil.  Instructed patient to contact [facility] to schedule his MRI, [phone].  Informed him once the MRI is scheduled, we will schedule a F/U appointment with Dr Mark Sil,  Patient instructed to contact this office with any questions/concerns, [phone].

## 2023-12-04 ENCOUNTER — Other Ambulatory Visit (HOSPITAL_COMMUNITY): Payer: Self-pay

## 2023-12-05 ENCOUNTER — Other Ambulatory Visit: Payer: Self-pay | Admitting: Radiation Therapy

## 2023-12-11 ENCOUNTER — Telehealth: Payer: Self-pay | Admitting: Internal Medicine

## 2023-12-11 NOTE — Telephone Encounter (Signed)
 Called to schedule patient appointment to due Staff message . I talked  to patient and they are aware of the upcoming appointment

## 2023-12-31 ENCOUNTER — Other Ambulatory Visit (HOSPITAL_COMMUNITY): Payer: Self-pay

## 2024-01-07 ENCOUNTER — Ambulatory Visit
Admission: RE | Admit: 2024-01-07 | Discharge: 2024-01-07 | Disposition: A | Discharge status: Home / Self Care | Source: Ambulatory Visit | Attending: Internal Medicine | Admitting: Internal Medicine

## 2024-01-07 DIAGNOSIS — D352 Benign neoplasm of pituitary gland: Secondary | ICD-10-CM

## 2024-01-07 MED ORDER — GADOPICLENOL 0.5 MMOL/ML IV SOLN
10.0000 mL | Freq: Once | INTRAVENOUS | Status: AC | PRN
  Administered 2024-01-07: 10 mL via INTRAVENOUS

## 2024-01-10 ENCOUNTER — Other Ambulatory Visit: Payer: Self-pay

## 2024-01-10 ENCOUNTER — Other Ambulatory Visit (HOSPITAL_COMMUNITY): Payer: Self-pay

## 2024-01-10 MED ORDER — ZEPBOUND 7.5 MG/0.5ML ~~LOC~~ SOAJ
7.5000 mg | SUBCUTANEOUS | 1 refills | Status: DC
  Filled 2024-01-10: qty 2, 28d supply, fill #0

## 2024-01-14 ENCOUNTER — Inpatient Hospital Stay: Attending: Internal Medicine | Admitting: Internal Medicine

## 2024-01-14 ENCOUNTER — Encounter

## 2024-01-14 VITALS — BP 102/71 | HR 60 | Temp 97.2°F | Resp 20 | Wt 304.4 lb

## 2024-01-14 DIAGNOSIS — D352 Benign neoplasm of pituitary gland: Secondary | ICD-10-CM | POA: Insufficient documentation

## 2024-01-14 NOTE — Progress Notes (Signed)
 [facility] at [facility] [address]  [place], [place] [address] [phone]   Interval Evaluation  Date of Service: 01/14/24 Patient Name: [name] [name] Patient MRN: [id number] Patient DOB: Aug 07, 1965 Provider: Arthea MARLA Manns, MD  Identifying Statement:  [name] [name] is a 58 y.o. 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 Leeann 10/06/2014-11/17/2014: The pituitary adenoma was treated to 52.2 Gy in 27 fractions.  Interval History:  [name] [name] presents today for follow up after recent MRI brain.  Continues to follow with endocrinology, no changes to thyroid  or cortisol dosing.  No new or progressive neurologic symptoms, denies seizures or headaches.   H+P (08/29/17) Patient presents today for routine follow up for his ACTH -secreting macroadenoma.  He describes no new or progressive neurologic symptoms.  His vision is normal at this time.  He does continue to struggle with appetite and weight loss, understanding that these issues are tied in with his pituitary dysfunction.  He follows closely with endocrinology, taking hydrocortisone  and levothyroxine  hormone therapy.  He is still non-ambulatory because of orthopedic pain and obesity.    Medications: Current Outpatient Medications on File Prior to Visit  Medication Sig Dispense Refill   acetaminophen  (TYLENOL ) 325 MG tablet Take 2 tablets (650 mg total) by mouth every 6 (six) hours as needed for mild pain (or Fever >/= 101).     allopurinol  (ZYLOPRIM ) 300 MG tablet Take 300 mg by mouth daily.     ALPRAZolam  (XANAX ) 0.25 MG tablet Take 1 tablet (0.25 mg total) by mouth 3 (three) times daily as needed for anxiety or sleep (and QHS prn insomnia). 60 tablet 0   aspirin  81 MG chewable tablet Chew 81 mg by mouth daily.     atorvastatin  (LIPITOR) 20 MG tablet TAKE 1 TABLET BY MOUTH EVERY DAY 90 tablet 3   buPROPion  (WELLBUTRIN  XL) 300 MG  24 hr tablet Take 300 mg by mouth daily.     Calcium  Carbonate Antacid (TUMS PO) Take 1,000 mg by mouth daily as needed (heartburn).     carboxymethylcellulose (REFRESH PLUS) 0.5 % SOLN Place 2 drops into both eyes daily as needed (for dry eyes).     cyclobenzaprine  (FLEXERIL ) 10 MG tablet Take 10 mg by mouth 3 (three) times daily as needed for muscle spasms.     diclofenac  sodium (VOLTAREN ) 1 % GEL Apply 1 Application topically daily as needed (pain).     Diclofenac -miSOPROStol  75-0.2 MG TBEC Take 1 tablet by mouth 2 (two) times daily.      Evolocumab  (REPATHA  SURECLICK) 140 MG/ML SOAJ Inject 140mg  under the skin every 14 days 2 mL 11   fluticasone  (FLONASE ) 50 MCG/ACT nasal spray Place 1 spray into both nostrils daily as needed for allergies or rhinitis.     HYDROcodone -acetaminophen  (NORCO/VICODIN) 5-325 MG tablet Take 1 tablet by mouth every 6 (six) hours as needed for moderate pain.     hydrocortisone  (CORTEF ) 20 MG tablet Take 20 mg by mouth daily.     isosorbide  mononitrate (IMDUR ) 30 MG 24 hr tablet TAKE 1 TABLET BY MOUTH EVERY DAY 90 tablet 0   levothyroxine  (SYNTHROID ) 125 MCG tablet Take 125 mcg by mouth daily before breakfast.     metoprolol  succinate (TOPROL -XL) 25 MG 24 hr tablet TAKE 1 TABLET (25 MG TOTAL) BY MOUTH DAILY. 90 tablet 0   modafinil  (PROVIGIL ) 100 MG tablet Take 100 mg by mouth daily.     pantoprazole  (PROTONIX ) 40  MG tablet Take 40 mg by mouth daily.     pseudoephedrine (SUDAFED) 120 MG 12 hr tablet Take 120 mg by mouth every 12 (twelve) hours as needed for congestion.     rOPINIRole  (REQUIP ) 0.5 MG tablet Take 0.5 mg by mouth at bedtime as needed (RLS).     Semaglutide , 1 MG/DOSE, (OZEMPIC , 1 MG/DOSE,) 4 MG/3ML SOPN Inject 1 mg into the skin every Sunday.     Semaglutide -Weight Management (WEGOVY ) 0.25 MG/0.5ML SOAJ Inject 0.25 mg into the skin once a week. 2 mL 11   Semaglutide -Weight Management (WEGOVY ) 0.5 MG/0.5ML SOAJ Inject 0.5 mg into the skin once a week. 2 mL  0   Semaglutide -Weight Management (WEGOVY ) 1 MG/0.5ML SOAJ Inject 1 mg into the skin once a week. 2 mL 0   sertraline  (ZOLOFT ) 100 MG tablet Take 150 mg by mouth daily. Taking 1 & 1/2 tablets     tamsulosin  (FLOMAX ) 0.4 MG CAPS capsule Take 0.4 mg by mouth daily.     tirzepatide  (ZEPBOUND ) 10 MG/0.5ML Pen Inject 10 mg into the skin once a week. 2 mL 0   tirzepatide  (ZEPBOUND ) 2.5 MG/0.5ML Pen Inject 2.5 mg into the skin once a week. 2 mL 0   tirzepatide  (ZEPBOUND ) 5 MG/0.5ML Pen Inject 5 mg into the skin once a week. 2 mL 0   tirzepatide  (ZEPBOUND ) 7.5 MG/0.5ML Pen Inject 7.5 mg into the skin once a week. 2 mL 1   traMADol  (ULTRAM ) 50 MG tablet Take 50 mg by mouth daily in the afternoon. 1-2 tablets daily     zolpidem  (AMBIEN ) 10 MG tablet Take 1 tablet (10 mg total) by mouth at bedtime as needed for sleep. 30 tablet 5   No current facility-administered medications on file prior to visit.    Allergies:  Allergies  Allergen Reactions   Morphine Hives, Itching and Rash   Penicillins Anaphylaxis   Past Medical History:  Past Medical History:  Diagnosis Date   Allergic rhinitis    Anxiety    Arthritis    Chronic kidney disease    Depression    Diastolic CHF (HCC)    Gout    H/O acute respiratory failure    History of kidney stones    Hypertension    Hypothyroidism    Morbid obesity (HCC)    Peripheral neuropathy    Pituitary tumor    PONV (postoperative nausea and vomiting)    Sleep apnea    uses bi-pap   Past Surgical History:  Past Surgical History:  Procedure Laterality Date   ABDOMINAL SURGERY  06/2013   for cyst with MRSA   ACHILLES TENDON REPAIR Bilateral    APPLICATION OF A-CELL OF CHEST/ABDOMEN N/A 01/26/2014   Procedure: PLACEMENT OF A CELL AND VAC ;  Surgeon: Estefana Reichert, DO;  Location: [facility] OR;  Service: Plastics;  Laterality: N/A;   APPLICATION OF A-CELL OF CHEST/ABDOMEN N/A 02/02/2014   Procedure: WITH PLACEMENT OF A CELL AND VAC ;  Surgeon: Estefana Reichert, DO;   Location: [facility] OR;  Service: Plastics;  Laterality: N/A;   BRAIN SURGERY     COLON RESECTION     CORONARY STENT INTERVENTION N/A 07/28/2021   Procedure: CORONARY STENT INTERVENTION;  Surgeon: Swaziland, Peter M, MD;  Location: [facility] INVASIVE CV LAB;  Service: Cardiovascular;  Laterality: N/A;   CRANIOTOMY N/A 12/10/2013   Procedure: Transpheniodal resection of Pituitary tumor with Dr. Medford Angle for approach;  Surgeon: Catalina CHRISTELLA Stains, MD;  Location: [facility] NEURO ORS;  Service:  Neurosurgery;  Laterality: N/A;  Transpheniodal resection of Pituitary tumor with Dr. Medford Angle for approach   CYSTOSCOPY WITH RETROGRADE PYELOGRAM, URETEROSCOPY AND STENT PLACEMENT Left 05/09/2022   Procedure: CYSTOSCOPY WITH RETROGRADE PYELOGRAM AND STENT PLACEMENT;  Surgeon: Cam Morene ORN, MD;  Location: [facility] ORS;  Service: Urology;  Laterality: Left;   CYSTOSCOPY/URETEROSCOPY/HOLMIUM LASER/STENT PLACEMENT Right 07/27/2022   Procedure: CYSTOSCOPY RIGHT RETROGRADE /URETEROSCOPY/HOLMIUM LASER/RIGHT URETERAL STENT EXCHANGE;  Surgeon: Cam Morene ORN, MD;  Location: [facility] ORS;  Service: Urology;  Laterality: Right;  75 MINUTES NEEDED FOR CASE   HERNIA REPAIR     INCISION AND DRAINAGE OF WOUND N/A 01/26/2014   Procedure: IRRIGATION AND DEBRIDEMENT ABDOMINAL WOUND WITH ;  Surgeon: Estefana Reichert, DO;  Location: [facility] OR;  Service: Plastics;  Laterality: N/A;   INCISION AND DRAINAGE OF WOUND N/A 02/02/2014   Procedure: IRRIGATION AND DEBRIDEMENT ABDOMINAL WOUND;  Surgeon: Estefana Reichert, DO;  Location: [facility] OR;  Service: Plastics;  Laterality: N/A;   INCISION AND DRAINAGE OF WOUND N/A 02/12/2014   Procedure: IRRIGATION AND DEBRIDEMENT OF ABDOMINAL WOUND/PLACEMENT OF A-CELL AND VAC;  Surgeon: Estefana Reichert, DO;  Location: [facility] OR;  Service: Plastics;  Laterality: N/A;   INCISION AND DRAINAGE OF WOUND N/A 04/01/2014   Procedure: IRRIGATION AND DEBRIDEMENT ABDOMINAL WOUND WITH PLACEMENT OF ACELL/VAC;  Surgeon: Estefana Reichert, DO;  Location: [facility] OR;   Service: Plastics;  Laterality: N/A;   LEFT HEART CATH AND CORONARY ANGIOGRAPHY N/A 07/28/2021   Procedure: LEFT HEART CATH AND CORONARY ANGIOGRAPHY;  Surgeon: Swazil[place], Peter M, MD;  Location: [facility] INVASIVE CV LAB;  Service: Cardiovascular;  Laterality: N/A;   Sigmoid Colonoscopy     TRANSNASAL APPROACH N/A 12/10/2013   Procedure: TRANSNASAL APPROACH;  Surgeon: Lonni FORBES Angle, MD;  Location: [facility] NEURO ORS;  Service: ENT;  Laterality: N/A;   Social History:  Social History   Socioeconomic History   Marital status: Single    Spouse name: Not on file   Number of children: Not on file   Years of education: Not on file   Highest education level: Not on file  Occupational History   Not on file  Tobacco Use   Smoking status: Never   Smokeless tobacco: Never  Vaping Use   Vaping status: Never Used  Substance and Sexual Activity   Alcohol  use: No   Drug use: No   Sexual activity: Never  Other Topics Concern   Not on file  Social History Narrative   Not on file   Social Drivers of Health   Financial Resource Strain: Not on file  Food Insecurity: No Food Insecurity (05/10/2022)   Hunger Vital Sign    Worried About Running Out of Food in the Last Year: Never true    Ran Out of Food in the Last Year: Never true  Transportation Needs: No Transportation Needs (05/10/2022)   PRAPARE - Administrator, Civil Service (Medical): No    Lack of Transportation (Non-Medical): No  Physical Activity: Not on file  Stress: Not on file  Social Connections: Not on file  Intimate Partner Violence: Not At Risk (05/10/2022)   Humiliation, Afraid, Rape, and Kick questionnaire    Fear of Current or Ex-Partner: No    Emotionally Abused: No    Physically Abused: No    Sexually Abused: No   Family History:  Family History  Problem Relation Age of Onset   Hypertension Mother    Hypertension Father    Gout Father    Hypothyroidism  Father    Gout Brother    Cancer Maternal Aunt         colon   Cancer Maternal Grandmother        breast    Review of Systems: Constitutional: Denies fevers, chills or abnormal weight loss Eyes: Denies blurriness of vision Ears, nose, mouth, throat, and face: Denies mucositis or sore throat Respiratory: Denies cough, dyspnea or wheezes Cardiovascular: Denies palpitation, chest discomfort or lower extremity swelling Gastrointestinal:  Denies nausea, constipation, diarrhea GU: Denies dysuria or incontinence Skin: Denies abnormal skin rashes Neurological: Per HPI Musculoskeletal: +joint pain Behavioral/Psych: Denies anxiety, disturbance in thought content, and mood instability  Physical Exam: KPS: 80. General: Alert, cooperative, pleasant, in no acute distress.  In wheelchair. Head: Normal EENT: No conjunctival injection or scleral icterus. Oral mucosa moist Lungs: Resp effort normal Cardiac: deferred Abdomen: non-distended abdomen Skin: No rashes cyanosis or petechiae. Extremities: deferred  Neurologic Exam: Mental Status: Awake, alert, attentive to examiner. Oriented to self and environment. Language is fluent with intact comprehension.  Cranial Nerves: Visual acuity is grossly normal. Extra-ocular movements intact. No ptosis. Face is symmetric, tongue midline. Motor: Power is full in both arms and legs Sensory: deferred Gait: Deferred  Labs: I have reviewed the data as listed    Component Value Date/Time   NA 139 07/18/2022 1037   K 3.8 07/18/2022 1037   CL 108 07/18/2022 1037   CO2 23 07/18/2022 1037   GLUCOSE 95 07/18/2022 1037   BUN 19 07/18/2022 1037   CREATININE 1.26 (H) 07/18/2022 1037   CALCIUM  9.0 07/18/2022 1037   PROT 7.2 05/09/2022 1723   ALBUMIN 3.8 05/09/2022 1723   AST 49 (H) 05/09/2022 1723   ALT 28 05/09/2022 1723   ALKPHOS 81 05/09/2022 1723   BILITOT 1.1 05/09/2022 1723   GFRNONAA 68 01/26/2023 1404   GFRAA >90 08/28/2014 1059   Lab Results  Component Value Date   WBC 5.2 07/18/2022    NEUTROABS 4.5 05/17/2022   HGB 12.9 (L) 07/18/2022   HCT 39.5 07/18/2022   MCV 92.1 07/18/2022   PLT 122 (L) 07/18/2022    Imaging: [facility] Clinician Interpretation: I have personally reviewed the CNS images as listed.  My interpretation, in the context of the patient's clinical presentation, is stable disease  MR Brain W Wo Contrast Result Date: 01/07/2024 EXAM: MRI BRAIN WITH AND WITHOUT CONTRAST 01/07/2024 01:48:00 PM TECHNIQUE: Multiplanar multisequence MRI of the head/brain was performed with and without the administration of intravenous contrast. COMPARISON: MRI head dated 10/06/2021. CLINICAL HISTORY: Brain/CNS neoplasm, monitor; Pituitary adenoma. Brain w/wo ; Pituitary protocol; 10mL of vueway , wasted 0mL; F/u, Pituitary adenoma since 2015, no hx of CA; Prior MRI's here FINDINGS: BRAIN AND VENTRICLES: No acute infarct. No acute intracranial hemorrhage. No mass effect or midline shift. No hydrocephalus. T2/FLAIR hyperintensity is present in the periventricular and subcortical white matter as well as mild signal abnormality in the pons suggestive of mild chronic microvascular ischemic changes. Redemonstrated chronic postsurgical changes of the sella. Similar partial empty configuration of the sella. Chronic leftward deviation of the pituitary infundibulum. Similar enhancement of the pituitary infundibulum. There is no evidence of suprasellar mass. The cavernous sinuses are unremarkable. Normal appearance of the hypothalamus. No evidence of discrete pituitary lesion on the current study. ORBITS: No acute abnormality. SINUSES: No acute abnormality. BONES AND SOFT TISSUES: Normal bone marrow signal and enhancement. No acute soft tissue abnormality. IMPRESSION: 1. No evidence of discrete pituitary lesion. Similar post treatment appearance of the sella.  2. Similar  chronic microvascular ischemic changes. Electronically signed by: Donnice Mania MD 01/07/2024 04:18 PM EDT RP Workstation: [id number]     Assessment/Plan 1. Pituitary adenoma [facility])  We appreciate the opportunity to participate in the care of [name] [name].  He is clinically and radiographically stable today, no new or progressive changes.    He will continue to follow closely with Endocrinology.    We ask that [name] [name] return to clinic in 24 months following next brain MRI, or sooner as needed.  All questions were answered. The patient knows to call the clinic with any problems, questions or concerns, including any new neurologic deficits. No barriers to learning were detected.  I have spent a total of 30 minutes of face-to-face and non-face-to-face time, excluding clinical staff time, preparing to see patient, ordering tests and/or medications, counseling the patient, and independently interpreting results and communicating results to the patient/family/caregiver    Seif Teichert K Sergey Ishler, MD Medical Director of [facility] at [place] 01/14/24 12:16 PM

## 2024-01-19 ENCOUNTER — Other Ambulatory Visit: Payer: Self-pay | Admitting: Cardiovascular Disease

## 2024-01-21 DIAGNOSIS — I10 Essential (primary) hypertension: Secondary | ICD-10-CM | POA: Diagnosis not present

## 2024-01-21 DIAGNOSIS — Z125 Encounter for screening for malignant neoplasm of prostate: Secondary | ICD-10-CM | POA: Diagnosis not present

## 2024-01-21 DIAGNOSIS — R7303 Prediabetes: Secondary | ICD-10-CM | POA: Diagnosis not present

## 2024-01-28 DIAGNOSIS — G629 Polyneuropathy, unspecified: Secondary | ICD-10-CM | POA: Diagnosis not present

## 2024-01-28 DIAGNOSIS — Z0000 Encounter for general adult medical examination without abnormal findings: Secondary | ICD-10-CM | POA: Diagnosis not present

## 2024-01-28 DIAGNOSIS — M109 Gout, unspecified: Secondary | ICD-10-CM | POA: Diagnosis not present

## 2024-01-28 DIAGNOSIS — D696 Thrombocytopenia, unspecified: Secondary | ICD-10-CM | POA: Diagnosis not present

## 2024-01-28 DIAGNOSIS — F339 Major depressive disorder, recurrent, unspecified: Secondary | ICD-10-CM | POA: Diagnosis not present

## 2024-01-28 DIAGNOSIS — R5383 Other fatigue: Secondary | ICD-10-CM | POA: Diagnosis not present

## 2024-01-28 DIAGNOSIS — E038 Other specified hypothyroidism: Secondary | ICD-10-CM | POA: Diagnosis not present

## 2024-01-28 DIAGNOSIS — G4733 Obstructive sleep apnea (adult) (pediatric): Secondary | ICD-10-CM | POA: Diagnosis not present

## 2024-01-28 DIAGNOSIS — E230 Hypopituitarism: Secondary | ICD-10-CM | POA: Diagnosis not present

## 2024-02-06 ENCOUNTER — Ambulatory Visit: Attending: Cardiovascular Disease | Admitting: Cardiovascular Disease

## 2024-02-15 ENCOUNTER — Other Ambulatory Visit: Payer: Self-pay | Admitting: Cardiovascular Disease

## 2024-03-19 ENCOUNTER — Other Ambulatory Visit: Payer: Self-pay | Admitting: Cardiovascular Disease

## 2024-04-27 ENCOUNTER — Other Ambulatory Visit (HOSPITAL_COMMUNITY): Payer: Self-pay

## 2024-04-28 ENCOUNTER — Other Ambulatory Visit (HOSPITAL_COMMUNITY): Payer: Self-pay

## 2024-04-28 MED ORDER — ZEPBOUND 10 MG/0.5ML ~~LOC~~ SOAJ
10.0000 mg | SUBCUTANEOUS | 0 refills | Status: DC
  Filled 2024-04-28: qty 2, 28d supply, fill #0

## 2024-04-28 MED ORDER — REPATHA SURECLICK 140 MG/ML ~~LOC~~ SOAJ
140.0000 mg | SUBCUTANEOUS | 11 refills | Status: AC
  Filled 2024-04-28: qty 2, 28d supply, fill #0
  Filled 2024-06-13 – 2024-07-09 (×3): qty 2, 28d supply, fill #1

## 2024-04-29 ENCOUNTER — Ambulatory Visit: Attending: Cardiovascular Disease | Admitting: Cardiovascular Disease

## 2024-04-29 ENCOUNTER — Encounter: Payer: Self-pay | Admitting: Cardiovascular Disease

## 2024-04-29 VITALS — BP 115/60 | HR 79 | Resp 12 | Ht 69.0 in | Wt 269.0 lb

## 2024-04-29 DIAGNOSIS — E785 Hyperlipidemia, unspecified: Secondary | ICD-10-CM

## 2024-04-29 DIAGNOSIS — Z8639 Personal history of other endocrine, nutritional and metabolic disease: Secondary | ICD-10-CM

## 2024-04-29 DIAGNOSIS — E039 Hypothyroidism, unspecified: Secondary | ICD-10-CM | POA: Diagnosis not present

## 2024-04-29 DIAGNOSIS — I25118 Atherosclerotic heart disease of native coronary artery with other forms of angina pectoris: Secondary | ICD-10-CM | POA: Diagnosis not present

## 2024-04-29 NOTE — Progress Notes (Signed)
 Cardiology Office Note:    Date:  05/02/2024   ID:  [name] [name], DOB Feb 10, 1966, MRN [id number]  PCP:  [name] Nottingham, MD   [facility] Providers Cardiologist:  [name] Balding, MD     Referring MD: [name] Nottingham, MD   Chief Complaint  Patient presents with   Coronary Artery Disease   History of Present Illness:    [name] [name] is a 58 y.o. 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  He is doing well.  He has made a lot of progress with weight loss, losing 40 pounds in just the last 3 months, on treatment with tirzepatide .  He has lost a total of 130 pounds in the last year and a half while on GLP-1 agonists.  For the first time in many years his BMI is less than 40.  He was very pleased today that he was actually able to stand up by himself on the scale to weigh, which she has not been able to do in a very long time.  His knees are painful and make a lot of cracking sounds due to arthritis which still limits his mobility.  He remains mostly limited to his electrical scooter, but with his usual activities he has not had problems with shortness of breath and denies any chest pain.  He does not have orthopnea or PND.  He denies palpitations, dizziness, syncope.  He has chronic lower extremity edema that has improved.  He uses CPAP conscientiously, but feels sleepy during the day nevertheless.  Does not seem to have any benefit that he can tell from modafinil .  He has generalized aches and pains on atorvastatin  and rosuvastatin  and is currently on Repatha  with excellent results.  His recent LDL was 68, but he still has his chronically very low HDL of 30.  Triglycerides are borderline at 157.   Hemoglobin A1c is exceptional at 4.8.  He has borderline elevation in creatinine, most recently 1.26.  His TSH is low at 0.062, but he has hypopituitarism.  He is on levothyroxine  supplementation.  And followed regularly by an endocrinologist.  He has had problems with bilateral ureteral stones and has undergone procedures with Dr. Cam.  [name] [name] is limited to transition by sliding from his electric scooter to bed or commode.  He cannot walk.  About 20 years ago he was slim and used to be a runner but then developed steady weight gain, had to undergo multiple colon surgeries, complicated by a protracted course with abdominal wound infection requiring prolonged antibiotics.  He developed sequential bilateral Achilles tendon rupture, probably due to prolonged treatment with Cipro .  He was subsequently diagnosed with Cushing syndrome, which further limited his ability to be active due to proximal muscle weakness and atrophy.  Because of this, his ability to perform any type of physical exertion is severely limited.  He had normal ABIs on 06/15/2021.  An echocardiogram in November 2015 describes an EF of 45-50% with inferior wall hypokinesis.  He has normal renal function.  He does not have diabetes mellitus but is taking Ozempic  for weight loss and has managed to lose 60 pounds since therapy initiation.  His lipid profile showed an LDL cholesterol of 84 and he has started on rosuvastatin  5 mg daily.  Labs on 07/11/2021 showed that  his LDL is now down to 64, but HDL is also lower at 35.  His paternal grandfather (smoker) died of a myocardial infarction at age 70 and he has 2 paternal uncles that also died of myocardial infarction, albeit at more advanced ages.  His grandmother also had coronary disease despite a very healthy lifestyle and lean body habitus.  Past Medical History:  Diagnosis Date   Allergic rhinitis    Anxiety    Arthritis    Chronic kidney disease    Depression    Diastolic CHF  (HCC)    Gout    H/O acute respiratory failure    History of kidney stones    Hypertension    Hypothyroidism    Morbid obesity (HCC)    Peripheral neuropathy    Pituitary tumor    PONV (postoperative nausea and vomiting)    Sleep apnea    uses bi-pap    Past Surgical History:  Procedure Laterality Date   ABDOMINAL SURGERY  06/2013   for cyst with MRSA   ACHILLES TENDON REPAIR Bilateral    APPLICATION OF A-CELL OF CHEST/ABDOMEN N/A 01/26/2014   Procedure: PLACEMENT OF A CELL AND VAC ;  Surgeon: Estefana Reichert, DO;  Location: [facility] OR;  Service: Plastics;  Laterality: N/A;   APPLICATION OF A-CELL OF CHEST/ABDOMEN N/A 02/02/2014   Procedure: WITH PLACEMENT OF A CELL AND VAC ;  Surgeon: Estefana Reichert, DO;  Location: [facility] OR;  Service: Plastics;  Laterality: N/A;   BRAIN SURGERY     COLON RESECTION     CORONARY STENT INTERVENTION N/A 07/28/2021   Procedure: CORONARY STENT INTERVENTION;  Surgeon: Jordan, Peter M, MD;  Location: [facility] INVASIVE CV LAB;  Service: Cardiovascular;  Laterality: N/A;   CRANIOTOMY N/A 12/10/2013   Procedure: Transpheniodal resection of Pituitary tumor with Dr. Medford Angle for approach;  Surgeon: Catalina CHRISTELLA Stains, MD;  Location: [facility] NEURO ORS;  Service: Neurosurgery;  Laterality: N/A;  Transpheniodal resection of Pituitary tumor with Dr. Medford Angle for approach   CYSTOSCOPY WITH RETROGRADE PYELOGRAM, URETEROSCOPY AND STENT PLACEMENT Left 05/09/2022   Procedure: CYSTOSCOPY WITH RETROGRADE PYELOGRAM AND STENT PLACEMENT;  Surgeon: [name] Morene ORN, MD;  Location: [facility] ORS;  Service: Urology;  Laterality: Left;   CYSTOSCOPY/URETEROSCOPY/HOLMIUM LASER/STENT PLACEMENT Right 07/27/2022   Procedure: CYSTOSCOPY RIGHT RETROGRADE /URETEROSCOPY/HOLMIUM LASER/RIGHT URETERAL STENT EXCHANGE;  Surgeon: [name] Morene ORN, MD;  Location: [facility] ORS;  Service: Urology;  Laterality: Right;  75 MINUTES NEEDED FOR CASE   HERNIA REPAIR     INCISION AND DRAINAGE OF WOUND N/A 01/26/2014   Procedure:  IRRIGATION AND DEBRIDEMENT ABDOMINAL WOUND WITH ;  Surgeon: Estefana Reichert, DO;  Location: [facility] OR;  Service: Plastics;  Laterality: N/A;   INCISION AND DRAINAGE OF WOUND N/A 02/02/2014   Procedure: IRRIGATION AND DEBRIDEMENT ABDOMINAL WOUND;  Surgeon: Estefana Reichert, DO;  Location: [facility] OR;  Service: Plastics;  Laterality: N/A;   INCISION AND DRAINAGE OF WOUND N/A 02/12/2014   Procedure: IRRIGATION AND DEBRIDEMENT OF ABDOMINAL WOUND/PLACEMENT OF A-CELL AND VAC;  Surgeon: Estefana Reichert, DO;  Location: [facility] OR;  Service: Plastics;  Laterality: N/A;   INCISION AND DRAINAGE OF WOUND N/A 04/01/2014   Procedure: IRRIGATION AND DEBRIDEMENT ABDOMINAL WOUND WITH PLACEMENT OF ACELL/VAC;  Surgeon: Estefana Reichert, DO;  Location: [facility] OR;  Service: Plastics;  Laterality: N/A;   LEFT HEART CATH AND CORONARY ANGIOGRAPHY N/A 07/28/2021   Procedure: LEFT HEART CATH AND CORONARY ANGIOGRAPHY;  Surgeon: Jordan, Peter M, MD;  Location: [facility] INVASIVE CV LAB;  Service: Cardiovascular;  Laterality: N/A;   Sigmoid Colonoscopy     TRANSNASAL APPROACH N/A 12/10/2013   Procedure: TRANSNASAL APPROACH;  Surgeon: Lonni FORBES Angle, MD;  Location: [facility] NEURO ORS;  Service: ENT;  Laterality: N/A;    Current Medications: Current Meds  Medication Sig   Calcium  Carbonate Antacid (TUMS PO) Take 1,000 mg by mouth daily as needed (heartburn).   diclofenac  sodium (VOLTAREN ) 1 % GEL Apply 1 Application topically daily as needed (pain).   Diclofenac -miSOPROStol  75-0.2 MG TBEC Take 1 tablet by mouth 2 (two) times daily.    Evolocumab  (REPATHA  SURECLICK) 140 MG/ML SOAJ Inject 140mg  under the skin every 14 days   fluticasone  (FLONASE ) 50 MCG/ACT nasal spray Place 1 spray into both nostrils daily as needed for allergies or rhinitis.   hydrocortisone  (CORTEF ) 20 MG tablet Take 20 mg by mouth daily.   levothyroxine  (SYNTHROID ) 125 MCG tablet Take 125 mcg by mouth daily before breakfast.   metoprolol  succinate (TOPROL -XL) 25 MG 24 hr tablet TAKE 1 TABLET  (25 MG TOTAL) BY MOUTH DAILY. PLEASE KEEP SCHEDULED APPOINTMENT FOR FUTURE REFILLS. THANK YOU.   modafinil  (PROVIGIL ) 100 MG tablet Take 100 mg by mouth daily.   pantoprazole  (PROTONIX ) 40 MG tablet Take 40 mg by mouth daily.   pseudoephedrine (SUDAFED) 120 MG 12 hr tablet Take 120 mg by mouth every 12 (twelve) hours as needed for congestion.   rOPINIRole  (REQUIP ) 0.5 MG tablet Take 0.5 mg by mouth at bedtime as needed (RLS).   sertraline  (ZOLOFT ) 100 MG tablet Take 150 mg by mouth daily. Taking 1 & 1/2 tablets   tamsulosin  (FLOMAX ) 0.4 MG CAPS capsule Take 0.4 mg by mouth daily.   [DISCONTINUED] isosorbide  mononitrate (IMDUR ) 30 MG 24 hr tablet TAKE 1 TABLET BY MOUTH EVERY DAY     Allergies:   Morphine and Penicillins   Family History: The patient's family history includes Cancer in his maternal aunt and maternal grandmother; Gout in his brother and father; Hypertension in his father and mother; Hypothyroidism in his father.  ROS:   Please see the history of present illness.     All other systems reviewed and are negative.  EKGs/Labs/Other Studies Reviewed:    The following studies were reviewed today:  Echocardiogram 12/19/2013  - Left ventricle: The cavity size was normal. Wall thickness was    increased in a pattern of mild LVH. Systolic function was mildly    reduced. The estimated ejection fraction was in the range of 45%    to 50%. There is hypokinesis of the inferior myocardium.  - Mitral valve: There was mild regurgitation.  - Right ventricle: The cavity size was moderately dilated. Wall    thickness was normal. Systolic function was mildly reduced.     Cardiac catheterization 07/28/2021    Prox LAD to Mid LAD lesion is 85% stenosed.   1st Mrg lesion is 25% stenosed.   Prox RCA lesion is 25% stenosed.   A drug-eluting stent was successfully placed using a STENT ONYX FRONTIER 4.0X18.   Post intervention, there is a 0% residual stenosis.   The left ventricular systolic  function is normal.   LV end diastolic pressure is mildly elevated.   The left ventricular ejection fraction is 55-65% by visual estimate.   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 The vessel is tortuous proximally. There is diffuse ectasia of the mid vessel Normal LV function Mildly elevated LVEDP Successful PCI of the proximal LAD with DES.  Complicated by side  branch occlusion of small diagonal branch   Plan: will treat with IV Ntg. Observe overnight. Plan DAPT for one year. Anticipate DC tomorrow if stable.    Diagnostic Dominance: Right Intervention  Implants     Permanent Stent  Stent Onyx Frontier 4.0x18     EKG:    EKG Interpretation Date/Time:  Tuesday April 29 2024 08:47:01 EST Ventricular Rate:  81 PR Interval:  396 QRS Duration:  100 QT Interval:  396 QTC Calculation: 460 R Axis:   -6  Text Interpretation: Sinus rhythm with 1st degree A-V block ST & T wave abnormality, consider inferior ischemia ST & T wave abnormality, consider anterolateral ischemia Prolonged QT When compared with ECG of 08-May-2022 18:05, Sinus rhythm has replaced Junctional rhythm Questionable change in QRS axis T wave inversion now evident in Inferior leads T wave inversion now evident in Anterolateral leads QT has shortened Confirmed by Kairen Hallinan [phone]) on 04/29/2024 12:02:33 PM         Recent Labs: No results found for requested labs within last 365 days.  01/21/2024 Hemoglobin A1c 4.8%, cholesterol 125, HDL 30, LDL 68, triglycerides 157 TSH 0.062 Creatinine 1.26, BUN 18   Risk Assessment/Calculations:           Physical Exam:    VS:  BP 115/60 (BP Location: Right Arm, Patient Position: Sitting, Cuff Size: Large)   Pulse 79   Resp 12   Ht 5' 9 (1.753 m)   Wt 269 lb (122 kg)   SpO2 97%   BMI 39.72 kg/m     Wt Readings from Last 3 Encounters:  04/29/24 269 lb (122 kg)  01/14/24 (!) 304 lb 7 oz (138.1 kg)  09/01/22 (!) 398 lb (180.5 kg)      General: Alert, oriented x3, no distress, severely obese.  In diplomatic services operational officer. Head: no evidence of trauma, PERRL, EOMI, no exophtalmos or lid lag, no myxedema, no xanthelasma; normal ears, nose and oropharynx Neck: normal jugular venous pulsations and no hepatojugular reflux; brisk carotid pulses without delay and no carotid bruits Chest: clear to auscultation, no signs of consolidation by percussion or palpation, normal fremitus, symmetrical and full respiratory excursions Cardiovascular: normal position and quality of the apical impulse, regular rhythm, normal first and second heart sounds, no murmurs, rubs or gallops Abdomen: no tenderness or distention, no masses by palpation, no abnormal pulsatility or arterial bruits, normal bowel sounds, no hepatosplenomegaly Extremities: no clubbing, cyanosis or edema; 2+ radial, ulnar and brachial pulses bilaterally; 2+ right femoral, posterior tibial and dorsalis pedis pulses; 2+ left femoral, posterior tibial and dorsalis pedis pulses; no subclavian or femoral bruits Neurological: grossly nonfocal Psych: Normal mood and affect      ASSESSMENT:    1. Coronary artery disease of native artery of native heart with stable angina pectoris       PLAN:    In order of problems listed above:  CAD: asymptomatic despite the fact that he has a known jailed diagonal artery, following placement of the stent to his LAD in February 2023.  On aspirin , effective lipid-lowering therapy, low-dose beta-blocker, low-dose long-acting nitrates.  Will try to discontinue the long-acting isosorbide  mononitrate since he has not had any angina in over a year and a half. HLP: Had statin myopathy with both rosuvastatin  and atorvastatin .  Doing great on Repatha .  Chronically low HDL may improve with additional weight loss. Severe obesity: Exceptional response to GLP-1 agonist.  He has lost a total of 130 pounds.  He feels better.  He is very proud that he can now stand  up without assistance. Adrenal and thyroid  insufficiency status post pituitary resection: He is endocrinologist is Dr. Tommas at [facility].  On hydrocortisone  and levothyroxine  supplements.  He is aware of the potential need for stress doses of steroids if he develops acute illness. OSA: Despite compliance with CPAP and treatment with modafinil  he still has daytime hypersomnolence.  He may need a repeat sleep study.     Medication Adjustments/Labs and Tests Ordered: Current medicines are reviewed at length with the patient today.  Concerns regarding medicines are outlined above.  Orders Placed This Encounter  Procedures   EKG 12-Lead   No orders of the defined types were placed in this encounter.   Patient Instructions  Medication Instructions:  Stop Isosorbide  Mononitrate *If you need a refill on your cardiac medications before your next appointment, please call your pharmacy*  Lab Work: None ordered If you have labs (blood work) drawn today and your tests are completely normal, you will receive your results only by: MyChart Message (if you have MyChart) OR A paper copy in the mail If you have any lab test that is abnormal or we need to change your treatment, we will call you to review the results.  Testing/Procedures: None ordered  Follow-Up: At [facility], you and your health needs are our priority.  As part of our continuing mission to provide you with exceptional heart care, our providers are all part of one team.  This team includes your primary Cardiologist (physician) and Advanced Practice Providers or APPs (Physician Assistants and Nurse Practitioners) who all work together to provide you with the care you need, when you need it.  Your next appointment:   1 year(s)  Provider:   Jerel Balding, MD    We recommend signing up for the patient portal called MyChart.  Sign up information is provided on this After Visit Summary.  MyChart is  used to connect with patients for Virtual Visits (Telemedicine).  Patients are able to view lab/test results, encounter notes, upcoming appointments, etc.  Non-urgent messages can be sent to your provider as well.   To learn more about what you can do with MyChart, go to [email or web address].     Signed, [name] Balding, MD  05/02/2024 11:00 AM    [facility]

## 2024-04-29 NOTE — Patient Instructions (Signed)
 Medication Instructions:  Stop Isosorbide  Mononitrate *If you need a refill on your cardiac medications before your next appointment, please call your pharmacy*  Lab Work: None ordered If you have labs (blood work) drawn today and your tests are completely normal, you will receive your results only by: MyChart Message (if you have MyChart) OR A paper copy in the mail If you have any lab test that is abnormal or we need to change your treatment, we will call you to review the results.  Testing/Procedures: None ordered  Follow-Up: At [facility], you and your health needs are our priority.  As part of our continuing mission to provide you with exceptional heart care, our providers are all part of one team.  This team includes your primary Cardiologist (physician) and Advanced Practice Providers or APPs (Physician Assistants and Nurse Practitioners) who all work together to provide you with the care you need, when you need it.  Your next appointment:   1 year(s)  Provider:   Jerel Balding, MD    We recommend signing up for the patient portal called MyChart.  Sign up information is provided on this After Visit Summary.  MyChart is used to connect with patients for Virtual Visits (Telemedicine).  Patients are able to view lab/test results, encounter notes, upcoming appointments, etc.  Non-urgent messages can be sent to your provider as well.   To learn more about what you can do with MyChart, go to [email or web address].

## 2024-05-08 ENCOUNTER — Other Ambulatory Visit: Payer: Self-pay | Admitting: Cardiovascular Disease

## 2024-05-11 ENCOUNTER — Other Ambulatory Visit: Payer: Self-pay | Admitting: Cardiovascular Disease

## 2024-05-27 ENCOUNTER — Other Ambulatory Visit (HOSPITAL_COMMUNITY): Payer: Self-pay

## 2024-05-27 MED ORDER — ZEPBOUND 10 MG/0.5ML ~~LOC~~ SOAJ
10.0000 mg | SUBCUTANEOUS | 3 refills | Status: AC
  Filled 2024-05-27 – 2024-07-09 (×5): qty 2, 28d supply, fill #0

## 2024-06-10 ENCOUNTER — Other Ambulatory Visit (HOSPITAL_COMMUNITY): Payer: Self-pay

## 2024-06-13 ENCOUNTER — Other Ambulatory Visit (HOSPITAL_COMMUNITY): Payer: Self-pay

## 2024-06-25 ENCOUNTER — Other Ambulatory Visit (HOSPITAL_BASED_OUTPATIENT_CLINIC_OR_DEPARTMENT_OTHER): Payer: Self-pay

## 2024-06-25 ENCOUNTER — Other Ambulatory Visit (HOSPITAL_COMMUNITY): Payer: Self-pay

## 2024-06-29 ENCOUNTER — Other Ambulatory Visit (HOSPITAL_COMMUNITY): Payer: Self-pay

## 2024-07-03 ENCOUNTER — Other Ambulatory Visit (HOSPITAL_COMMUNITY): Payer: Self-pay

## 2024-07-07 ENCOUNTER — Other Ambulatory Visit (HOSPITAL_COMMUNITY): Payer: Self-pay

## 2024-07-09 ENCOUNTER — Other Ambulatory Visit: Payer: Self-pay

## 2024-07-09 ENCOUNTER — Other Ambulatory Visit (HOSPITAL_COMMUNITY): Payer: Self-pay

## 2024-07-10 ENCOUNTER — Other Ambulatory Visit: Payer: Self-pay
